# Patient Record
Sex: Female | Born: 1985 | Race: Black or African American | Hispanic: No | Marital: Single | Smoking: Current every day smoker
Health system: Southern US, Community
[De-identification: ages and names within clinical notes are randomized; demographics above are authoritative.]

## PROBLEM LIST (undated history)

## (undated) ENCOUNTER — Inpatient Hospital Stay (HOSPITAL_COMMUNITY): Payer: Self-pay

## (undated) DIAGNOSIS — N281 Cyst of kidney, acquired: Secondary | ICD-10-CM

## (undated) DIAGNOSIS — O021 Missed abortion: Secondary | ICD-10-CM

## (undated) DIAGNOSIS — D573 Sickle-cell trait: Secondary | ICD-10-CM

## (undated) DIAGNOSIS — J189 Pneumonia, unspecified organism: Secondary | ICD-10-CM

## (undated) DIAGNOSIS — N739 Female pelvic inflammatory disease, unspecified: Secondary | ICD-10-CM

## (undated) DIAGNOSIS — F1010 Alcohol abuse, uncomplicated: Secondary | ICD-10-CM

## (undated) DIAGNOSIS — D649 Anemia, unspecified: Secondary | ICD-10-CM

## (undated) DIAGNOSIS — E282 Polycystic ovarian syndrome: Secondary | ICD-10-CM

## (undated) DIAGNOSIS — F319 Bipolar disorder, unspecified: Secondary | ICD-10-CM

## (undated) DIAGNOSIS — K859 Acute pancreatitis without necrosis or infection, unspecified: Secondary | ICD-10-CM

## (undated) DIAGNOSIS — H35039 Hypertensive retinopathy, unspecified eye: Secondary | ICD-10-CM

## (undated) DIAGNOSIS — M722 Plantar fascial fibromatosis: Secondary | ICD-10-CM

## (undated) DIAGNOSIS — D219 Benign neoplasm of connective and other soft tissue, unspecified: Secondary | ICD-10-CM

## (undated) DIAGNOSIS — I10 Essential (primary) hypertension: Secondary | ICD-10-CM

## (undated) DIAGNOSIS — K862 Cyst of pancreas: Secondary | ICD-10-CM

## (undated) DIAGNOSIS — K219 Gastro-esophageal reflux disease without esophagitis: Secondary | ICD-10-CM

## (undated) DIAGNOSIS — J40 Bronchitis, not specified as acute or chronic: Secondary | ICD-10-CM

## (undated) DIAGNOSIS — E119 Type 2 diabetes mellitus without complications: Secondary | ICD-10-CM

## (undated) HISTORY — PX: DILATION AND CURETTAGE OF UTERUS: SHX78

## (undated) HISTORY — DX: Female pelvic inflammatory disease, unspecified: N73.9

## (undated) HISTORY — DX: Essential (primary) hypertension: I10

## (undated) HISTORY — DX: Polycystic ovarian syndrome: E28.2

## (undated) HISTORY — DX: Cyst of kidney, acquired: N28.1

## (undated) HISTORY — DX: Gastro-esophageal reflux disease without esophagitis: K21.9

## (undated) HISTORY — DX: Plantar fascial fibromatosis: M72.2

## (undated) HISTORY — DX: Acute pancreatitis without necrosis or infection, unspecified: K85.90

## (undated) HISTORY — PX: WISDOM TOOTH EXTRACTION: SHX21

## (undated) HISTORY — DX: Alcohol abuse, uncomplicated: F10.10

## (undated) HISTORY — DX: Hypertensive retinopathy, unspecified eye: H35.039

---
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
## 2004-10-07 ENCOUNTER — Other Ambulatory Visit: Admission: RE | Admit: 2004-10-07 | Discharge: 2004-10-07 | Payer: Self-pay | Admitting: Gynecology

## 2004-10-25 ENCOUNTER — Inpatient Hospital Stay (HOSPITAL_COMMUNITY): Admission: AD | Admit: 2004-10-25 | Discharge: 2004-10-26 | Payer: Self-pay | Admitting: Family Medicine

## 2006-04-17 ENCOUNTER — Inpatient Hospital Stay (HOSPITAL_COMMUNITY): Admission: AD | Admit: 2006-04-17 | Discharge: 2006-04-17 | Payer: Self-pay | Admitting: Obstetrics

## 2008-12-24 ENCOUNTER — Emergency Department (HOSPITAL_COMMUNITY): Admission: EM | Admit: 2008-12-24 | Discharge: 2008-12-24 | Payer: Self-pay | Admitting: Emergency Medicine

## 2008-12-25 ENCOUNTER — Emergency Department (HOSPITAL_COMMUNITY): Admission: EM | Admit: 2008-12-25 | Discharge: 2008-12-25 | Payer: Self-pay | Admitting: Emergency Medicine

## 2008-12-25 ENCOUNTER — Encounter (INDEPENDENT_AMBULATORY_CARE_PROVIDER_SITE_OTHER): Payer: Self-pay | Admitting: *Deleted

## 2008-12-30 ENCOUNTER — Emergency Department (HOSPITAL_COMMUNITY): Admission: EM | Admit: 2008-12-30 | Discharge: 2008-12-30 | Payer: Self-pay | Admitting: Emergency Medicine

## 2009-11-05 ENCOUNTER — Encounter: Admission: RE | Admit: 2009-11-05 | Discharge: 2009-11-05 | Payer: Self-pay | Admitting: Family Medicine

## 2010-03-07 HISTORY — PX: SPLENECTOMY, TOTAL: SHX788

## 2010-03-07 HISTORY — PX: PANCREAS SURGERY: SHX731

## 2010-05-10 ENCOUNTER — Emergency Department (HOSPITAL_COMMUNITY)
Admission: EM | Admit: 2010-05-10 | Discharge: 2010-05-10 | Disposition: A | Discharge status: Home / Self Care | Payer: Managed Care, Other (non HMO) | Attending: Emergency Medicine | Admitting: Emergency Medicine

## 2010-05-11 ENCOUNTER — Emergency Department (INDEPENDENT_AMBULATORY_CARE_PROVIDER_SITE_OTHER): Payer: Managed Care, Other (non HMO)

## 2010-05-11 ENCOUNTER — Inpatient Hospital Stay (HOSPITAL_COMMUNITY): Payer: Managed Care, Other (non HMO)

## 2010-05-11 ENCOUNTER — Encounter (INDEPENDENT_AMBULATORY_CARE_PROVIDER_SITE_OTHER): Payer: Self-pay | Admitting: *Deleted

## 2010-05-11 ENCOUNTER — Inpatient Hospital Stay (HOSPITAL_COMMUNITY)
Admission: AD | Admit: 2010-05-11 | Discharge: 2010-05-13 | DRG: 440 | Disposition: A | Discharge status: Home / Self Care | Payer: Managed Care, Other (non HMO) | Source: Other Acute Inpatient Hospital | Attending: Internal Medicine | Admitting: Internal Medicine

## 2010-05-11 ENCOUNTER — Emergency Department (HOSPITAL_BASED_OUTPATIENT_CLINIC_OR_DEPARTMENT_OTHER)
Admission: EM | Admit: 2010-05-11 | Discharge: 2010-05-11 | Disposition: A | Discharge status: Nursing Home (Includes ICF) | Payer: Managed Care, Other (non HMO) | Attending: Emergency Medicine | Admitting: Emergency Medicine

## 2010-05-11 DIAGNOSIS — K219 Gastro-esophageal reflux disease without esophagitis: Secondary | ICD-10-CM | POA: Diagnosis present

## 2010-05-11 DIAGNOSIS — J45909 Unspecified asthma, uncomplicated: Secondary | ICD-10-CM | POA: Diagnosis present

## 2010-05-11 DIAGNOSIS — R110 Nausea: Secondary | ICD-10-CM

## 2010-05-11 DIAGNOSIS — E669 Obesity, unspecified: Secondary | ICD-10-CM | POA: Diagnosis present

## 2010-05-11 DIAGNOSIS — F17200 Nicotine dependence, unspecified, uncomplicated: Secondary | ICD-10-CM | POA: Diagnosis present

## 2010-05-11 DIAGNOSIS — K863 Pseudocyst of pancreas: Secondary | ICD-10-CM | POA: Insufficient documentation

## 2010-05-11 DIAGNOSIS — K862 Cyst of pancreas: Secondary | ICD-10-CM | POA: Insufficient documentation

## 2010-05-11 DIAGNOSIS — D509 Iron deficiency anemia, unspecified: Secondary | ICD-10-CM | POA: Diagnosis present

## 2010-05-11 DIAGNOSIS — Z833 Family history of diabetes mellitus: Secondary | ICD-10-CM

## 2010-05-11 DIAGNOSIS — F1010 Alcohol abuse, uncomplicated: Secondary | ICD-10-CM | POA: Diagnosis present

## 2010-05-11 DIAGNOSIS — R109 Unspecified abdominal pain: Secondary | ICD-10-CM | POA: Insufficient documentation

## 2010-05-11 DIAGNOSIS — Z6379 Other stressful life events affecting family and household: Secondary | ICD-10-CM

## 2010-05-11 DIAGNOSIS — Z8249 Family history of ischemic heart disease and other diseases of the circulatory system: Secondary | ICD-10-CM

## 2010-05-11 DIAGNOSIS — E282 Polycystic ovarian syndrome: Secondary | ICD-10-CM | POA: Diagnosis present

## 2010-05-11 DIAGNOSIS — K861 Other chronic pancreatitis: Secondary | ICD-10-CM | POA: Diagnosis present

## 2010-05-11 LAB — DIFFERENTIAL
Eosinophils Absolute: 0.1 10*3/uL (ref 0.0–0.7)
Lymphocytes Relative: 25 % (ref 12–46)
Lymphs Abs: 1.6 10*3/uL (ref 0.7–4.0)
Monocytes Absolute: 0.5 10*3/uL (ref 0.1–1.0)
Monocytes Relative: 8 % (ref 3–12)
Neutro Abs: 4.3 10*3/uL (ref 1.7–7.7)
Neutrophils Relative %: 66 % (ref 43–77)

## 2010-05-11 LAB — COMPREHENSIVE METABOLIC PANEL
AST: 22 U/L (ref 0–37)
Alkaline Phosphatase: 98 U/L (ref 39–117)
BUN: 12 mg/dL (ref 6–23)
Sodium: 142 mEq/L (ref 135–145)
Total Protein: 7.7 g/dL (ref 6.0–8.3)

## 2010-05-11 LAB — CBC
Hemoglobin: 12.1 g/dL (ref 12.0–15.0)
MCH: 22.4 pg — ABNORMAL LOW (ref 26.0–34.0)
MCHC: 35 g/dL (ref 30.0–36.0)
WBC: 6.5 10*3/uL (ref 4.0–10.5)

## 2010-05-11 LAB — LIPASE, BLOOD: Lipase: 98 U/L (ref 23–300)

## 2010-05-11 MED ORDER — GADOBENATE DIMEGLUMINE 529 MG/ML IV SOLN: 18.0000 mL | Freq: Once | INTRAVENOUS | Status: DC

## 2010-05-11 MED ORDER — IOHEXOL 300 MG/ML  SOLN
100.0000 mL | Freq: Once | INTRAMUSCULAR | Status: AC | PRN
  Administered 2010-05-11: 97 mL via INTRAVENOUS

## 2010-05-12 DIAGNOSIS — K859 Acute pancreatitis without necrosis or infection, unspecified: Secondary | ICD-10-CM

## 2010-05-12 DIAGNOSIS — R933 Abnormal findings on diagnostic imaging of other parts of digestive tract: Secondary | ICD-10-CM

## 2010-05-13 ENCOUNTER — Encounter (INDEPENDENT_AMBULATORY_CARE_PROVIDER_SITE_OTHER): Payer: Self-pay | Admitting: *Deleted

## 2010-05-13 DIAGNOSIS — K859 Acute pancreatitis without necrosis or infection, unspecified: Secondary | ICD-10-CM

## 2010-05-13 LAB — CBC
Hemoglobin: 11.2 g/dL — ABNORMAL LOW (ref 12.0–15.0)
MCH: 22.1 pg — ABNORMAL LOW (ref 26.0–34.0)
MCHC: 32.7 g/dL (ref 30.0–36.0)
MCV: 67.6 fL — ABNORMAL LOW (ref 78.0–100.0)
Platelets: 257 10*3/uL (ref 150–400)

## 2010-05-13 LAB — COMPREHENSIVE METABOLIC PANEL
ALT: 17 U/L (ref 0–35)
Albumin: 3 g/dL — ABNORMAL LOW (ref 3.5–5.2)
BUN: 3 mg/dL — ABNORMAL LOW (ref 6–23)
Creatinine, Ser: 0.66 mg/dL (ref 0.4–1.2)
GFR calc non Af Amer: >60 mL/min (ref >60)
Sodium: 137 mEq/L (ref 135–145)
Total Bilirubin: 0.3 mg/dL (ref 0.3–1.2)

## 2010-05-13 LAB — DIFFERENTIAL
Eosinophils Absolute: 0.1 10*3/uL (ref 0.0–0.7)
Lymphocytes Relative: 37 % (ref 12–46)
Lymphs Abs: 1.9 10*3/uL (ref 0.7–4.0)
Monocytes Absolute: 0.5 10*3/uL (ref 0.1–1.0)
Neutro Abs: 2.6 10*3/uL (ref 1.7–7.7)
Neutrophils Relative %: 51 % (ref 43–77)

## 2010-05-13 LAB — PHOSPHORUS: Phosphorus: 3.2 mg/dL (ref 2.3–4.6)

## 2010-05-18 ENCOUNTER — Telehealth: Payer: Self-pay | Admitting: Gastroenterology

## 2010-05-18 NOTE — Letter (Signed)
Summary: New Patient letter  [facility]  [address] [place], [place] [address]   Phone: [phone]  Fax: [phone]       05/13/2010 MRN: [id number]  [facility] [address] [place], [place]  [address]  Dear [name],  Welcome to the Gastroenterology Division at [facility].    You are scheduled to see Dr.  Arlyce Dice on 06-24-10 at 10:00A.M. on the 3rd floor at [facility], [address]  We ask that you try to arrive at our office 15 minutes prior to your appointment time to allow for check-in.  We would like you to complete the enclosed self-administered evaluation form prior to your visit and bring it with you on the day of your appointment.  We will review it with you.  Also, please bring a complete list of all your medications or, if you prefer, bring the medication bottles and we will list them.  Please bring your insurance card so that we may make a copy of it.  If your insurance requires a referral to see a specialist, please bring your referral form from your primary care physician.  Co-payments are due at the time of your visit and may be paid by cash, check or credit card.     Your office visit will consist of a consult with your physician (includes a physical exam), any laboratory testing he/she may order, scheduling of any necessary diagnostic testing (e.g. x-ray, ultrasound, CT-scan), and scheduling of a procedure (e.g. Endoscopy, Colonoscopy) if required.  Please allow enough time on your schedule to allow for any/all of these possibilities.    If you cannot keep your appointment, please call [phone] to cancel or reschedule prior to your appointment date.  This allows [place] the opportunity to schedule an appointment for another patient in need of care.  If you do not cancel or reschedule by 5 p.m. the business day prior to your appointment date, you will be charged a $50.00 late cancellation/no-show fee.    Thank you for choosing  [facility] for your medical needs.  We appreciate the opportunity to care for you.  Please visit [place] at our website  to learn more about our practice.                     Sincerely,                                                             The Gastroenterology Division

## 2010-05-19 ENCOUNTER — Encounter: Payer: Self-pay | Admitting: Gastroenterology

## 2010-05-19 ENCOUNTER — Other Ambulatory Visit: Payer: Self-pay | Admitting: Gastroenterology

## 2010-05-19 ENCOUNTER — Ambulatory Visit (INDEPENDENT_AMBULATORY_CARE_PROVIDER_SITE_OTHER): Payer: Managed Care, Other (non HMO) | Admitting: Physician Assistant

## 2010-05-19 ENCOUNTER — Encounter: Payer: Self-pay | Admitting: Physician Assistant

## 2010-05-19 DIAGNOSIS — K862 Cyst of pancreas: Secondary | ICD-10-CM

## 2010-05-19 DIAGNOSIS — K859 Acute pancreatitis without necrosis or infection, unspecified: Secondary | ICD-10-CM

## 2010-05-19 DIAGNOSIS — K863 Pseudocyst of pancreas: Secondary | ICD-10-CM

## 2010-05-20 NOTE — H&P (Signed)
NAMEVINCENZINA, [name]           ACCOUNT NO.:  [id number]  MEDICAL RECORD NO.:  [id number]           PATIENT TYPE:  I  LOCATION:  5524                         FACILITY:  [facility]  PHYSICIAN:  Zannie Cove, MD     DATE OF BIRTH:  15-Sep-1985  DATE OF ADMISSION:  05/11/2010 DATE OF DISCHARGE:                             HISTORY & PHYSICAL   PRIMARY CARE PHYSICIAN:  Dr. Knox Royalty at Urgent Care and [name].  CHIEF COMPLAINT:  Abdominal pain.  The patient is transferred from [facility] ER to the floor today. . [name] is a 25 year old female with history of polycystic ovarian syndrome, asthma, and history of pelvic inflammatory disease.  She reports that she drank alcohol heavily a few days ago for the [employer] game and started having abdominal pain the before yesterday in the epigastric region radiating to her lower back.  This was associated with nausea and constipation.  There is no history of fever.  Pain was exacerbated by eating.  She went to the urgent care.  She subsequently reports that her pain started getting more worse yesterday and she had an episode of vomiting in the ER which was nonbloody and hence decided to go to [facility] for evaluation where they had a CAT scan done which showed an abnormality of the pancreas and hence transferred to [facility] for further evaluation and management.  Upon reviewing the CAT scan here now, it seems like it is most likely is a benign- appearing pancreatic cystic lesion in the body of the pancreas.  PAST MEDICAL HISTORY:  Significant for: 1. Asthma. 2. GERD. 3. Polycystic ovarian syndrome. 4. History of pelvic inflammatory disease.  MEDICATIONS: 1. Nexium 40 mg once a day. 2. Albuterol inhaler as needed.  ALLERGIES:  No known drug allergies.  SOCIAL HISTORY:  She lives with her fiance.  Works at [employer] as a Solicitor.  Denies any history of drug use.  Drinks 1-3 drinks a  week, but drank 3-4 drinks after games several days ago.  Smokes 1 pack in 2 days.  FAMILY HISTORY:  Diabetes and hypertension in the mother.  Father is a drug-addict.  REVIEW OF SYSTEMS:  A 12-system review is negative except per HPI.  PHYSICAL EXAMINATION:  VITAL SIGNS:  Temperature is 98.1, pulse 68, blood pressure 118/85, respirations 16, and saturating 99% on room air. GENERAL:  She is an obese African American female, laying in the stretcher, in no acute distress. HEENT:  Pupils are round and reactive to light.  Extraocular movements are intact.  No pallor or icterus. CARDIOVASCULAR:  S1 and S2.  Regular rate and rhythm. LUNGS:  Clear to auscultation bilaterally. ABDOMEN:  Soft and obese.  There is mild epigastric and right upper quadrant tenderness.  Murphy sign is negative.  There are normal bowel sounds.  No rigidity or rebound. EXTREMITIES:  No edema, clubbing, or cyanosis.  LABORATORY DATA:  Review of laboratory data shows a white count of 6.5, hemoglobin 12.1, and platelets 316.  Chemistries:  Sodium 142, potassium 4.3, chloride 107, bicarb 25, BUN 12, creatinine 0.6, and glucose 129. LFTs  normal.  Lipase is 98.  CT of abdomen and pelvis on May 10, 2010, shows a 5.1-cm cystic lesion in the pancreatic body.  Further evaluation with nonemergent MRI is recommended.  ASSESSMENT/PLAN:  [name] is a 25 year old female with: 1. Abdominal pain.  I suspect this is secondary to likely alcoholic     gastritis, gastroesophageal reflux disease. 2. Pancreatic cyst. 3. Alcohol use. 4. Tobacco use. 5. Asthma. 6. Polycystic ovarian syndrome.  PLAN:  We will give her GI cocktail now and put her on IV PPI and get an MRI of the abdomen.  Adequate pain control.  We will decrease the morphine dose.  The patient is advised regarding stopping alcohol.  May be able to discharge later today or tomorrow if workup unrevealing.  We will also check a urine pregnancy test and  UA.     Zannie Cove, MD     PJ/MEDQ  D:  05/11/2010  T:  05/11/2010  Job:  [id number]  Electronically Signed by Zannie Cove  on 05/20/2010 05:21:54 PM

## 2010-05-24 NOTE — Discharge Summary (Signed)
[name], [name]           ACCOUNT NO.:  [id number]  MEDICAL RECORD NO.:  [id number]           PATIENT TYPE:  I  LOCATION:  5524                         FACILITY:  [facility]  PHYSICIAN:  Rock Nephew, MD       DATE OF BIRTH:  [birth year]  DATE OF ADMISSION:  05/11/2010 DATE OF DISCHARGE:  05/13/2010                        DISCHARGE SUMMARY - REFERRING   PRIMARY CARE PHYSICIAN:  Dr. Knox Royalty at Urgent Care and [facility].  DISCHARGE DIAGNOSES: 1. 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 2. Abdominal pain from pancreatic pseudocyst. 3. Alcohol abuse, counseled. 4. Polycystic ovarian syndrome. 5. Asthma. 6. Iron deficiency, most likely evidence of microcytic anemia.  DISCHARGE MEDICATIONS: 1. Famotidine 20 mg p.o. daily. 2. Percocet 1 tablet p.o. q.6 h as needed for pain. 3. Zofran 4 mg by mouth every 6 hours as needed. 4. Albuterol 1-2 puffs by mouth daily as needed. 5. Also, the patient was instructed by the [facility] to buy     over-the-counter iron tablets and take 1 tablet daily at least four     times weekly.  CONSULTATIONS OF THIS CASE:  [facility], Dr. Arlyce Dice.  DIET ON DISCHARGE:  Low-fat diet, carbohydrate-modified diet to rest the pancreas.  FOLLOWUP:  The patient should follow up with Dr. Knox Royalty, primary care physician, within 1 week.  The patient should have a CT scan ordered hopefully by Dr. Yetta Barre to be done from June 15, 2010 to the June 22, 2010, and the results could be taken to Dr. Arlyce Dice on the patient's followup appointment with Dr. Arlyce Dice on June 24, 2010 at 10 a.m.  PROCEDURES PERFORMED:  The patient had a CT scan of the abdomen and pelvis, which showed 5.1 cm cystic lesion within the pancreatic body. Differential considerations include a pseudocyst or cystic neoplasm. Recommended further evaluation with nonemergent outpatient MRI of the abdomen without and with contrast.   The patient had MRI of the abdomen, which showed 5.4 cm and mildly complex cystic lesion in the junction of pancreatic body and tail, with findings of mild pancreatitis, involving the pancreatic tail.  This lesion suspicious for pancreatic pseudocyst, particularly if there is a history of prior pancreatitis.  Cystic pancreatic neoplasm suggest intraductal papillary mucinous neoplasm, serous cystadenoma cannot definitely be excluded.  Consider transgastric or percutaneous needle aspiration and continued imaging, followup MRI.  BRIEF HISTORY OF PRESENT ILLNESS:  This is a 25 year old female with history of polycystic ovarian syndrome, as mentioned pelvic inflammatory disease.  She reports she drank alcohol heavily few days ago for the [facility] [employer].  Also, the patient reports that she has been having drinking heavily since she was old enough to drink.  She came in with abdominal pain.  Imaging showed a complex pancreatic pseudocyst.  HOSPITAL COURSE: 1. Pancreatic pseudocyst.  The patient was initially n.p.o. and she     was on clear liquid diet.  She was seen in consultation with     [facility], they recommended to advance the patient's diet as     tolerated and to follow up with Dr. Arlyce Dice on June 24, 2010.  They     also recommended the patient to have a CT scan of the belly from     June 15, 2010, to June 22, 2010, before following up with Dr.     Arlyce Dice. 2. Abdominal pain.  The patient has had some abdominal pain.  This is     secondary to complex pancreatic pseudocyst. 3. Alcohol abuse.  The patient was counseled. 4. Polycystic ovarian syndrome, stable. 5. Asthma.  The patient takes an albuterol inhaler. 6. Possible iron deficiency.  The patient had a MCV of 67 and the     patient's hemoglobin was 11.2 and hematocrit 34.2.  [facility]     recommend the patient to take iron over-the-counter at least four     times a week.  The patient is eating a regular diet and is ready      for discharge on May 13, 2010.     Rock Nephew, MD     NH/MEDQ  D:  05/13/2010  T:  05/13/2010  Job:  [id number]  cc:   Dr. Ellin Mayhew. Arlyce Dice, MD,FACG  Electronically Signed by Rock Nephew MD on 05/24/2010 09:57:16 PM

## 2010-05-25 NOTE — Miscellaneous (Signed)
Summary: Hospital History and Physical  Jennifer [name], [name]           ACCOUNT NO.:  [id number]      MEDICAL RECORD NO.:  [id number]           PATIENT TYPE:  I      LOCATION:  5524                         FACILITY:  [facility]      PHYSICIAN:  Zannie Cove, MD     DATE OF BIRTH:  1985/11/19      DATE OF ADMISSION:  05/11/2010   DATE OF DISCHARGE:                                 HISTORY & PHYSICAL         PRIMARY CARE PHYSICIAN:  Dr. Knox Royalty at Urgent Care and [facility].      CHIEF COMPLAINT:  Abdominal pain.  The patient is transferred from [facility] ER to the floor today.   .   Jennifer [name] is a 25 year old female with history of polycystic ovarian   syndrome, asthma, and history of pelvic inflammatory disease.  She   reports that she drank alcohol heavily a few days ago for the [employer]   game and started having abdominal pain the before yesterday in the   epigastric region radiating to her lower back.  This was associated with   nausea and constipation.  There is no history of fever.  Pain was   exacerbated by eating.  She went to the urgent care.  She subsequently   reports that her pain started getting more worse yesterday and she had   an episode of vomiting in the ER which was nonbloody and hence decided   to go to [facility] for evaluation where they had a CAT scan   done which showed an abnormality of the pancreas and hence transferred   to [facility] for further evaluation and management.  Upon reviewing   the CAT scan here now, it seems like it is most likely is a benign-   appearing pancreatic cystic lesion in the body of the pancreas.      PAST MEDICAL HISTORY:  Significant for:   1. Asthma.   2. GERD.   3. Polycystic ovarian syndrome.   4. History of pelvic inflammatory disease.      MEDICATIONS:   1. Nexium 40 mg once a day.   2. Albuterol inhaler as needed.      ALLERGIES:  No known drug allergies.      SOCIAL  HISTORY:  She lives with her fiance.  Works at [employer] as a Solicitor.  Denies any history of drug use.  Drinks 1-3 drinks   a week, but drank 3-4 drinks after games several days ago.  Smokes 1   pack in 2 days.      FAMILY HISTORY:  Diabetes and hypertension in the mother.  Father is a   drug-addict.      REVIEW OF SYSTEMS:  A 12-system review is negative except per HPI.      PHYSICAL EXAMINATION:  VITAL SIGNS:  Temperature is 98.1, pulse 68,   blood pressure 118/85, respirations 16, and saturating 99% on room air.   GENERAL:  She is  an obese African American female, laying in the   stretcher, in no acute distress.   HEENT:  Pupils are round and reactive to light.  Extraocular movements   are intact.  No pallor or icterus.   CARDIOVASCULAR:  S1 and S2.  Regular rate and rhythm.   LUNGS:  Clear to auscultation bilaterally.   ABDOMEN:  Soft and obese.  There is mild epigastric and right upper   quadrant tenderness.  Murphy sign is negative.  There are normal bowel   sounds.  No rigidity or rebound.   EXTREMITIES:  No edema, clubbing, or cyanosis.      LABORATORY DATA:  Review of laboratory data shows a white count of 6.5,   hemoglobin 12.1, and platelets 316.  Chemistries:  Sodium 142, potassium   4.3, chloride 107, bicarb 25, BUN 12, creatinine 0.6, and glucose 129.   LFTs normal.  Lipase is 98.  CT of abdomen and pelvis on May 10, 2010,   shows a 5.1-cm cystic lesion in the pancreatic body.  Further evaluation   with nonemergent MRI is recommended.      ASSESSMENT/PLAN:  Jennifer [name] is a 25 year old female with:   1. Abdominal pain.  I suspect this is secondary to likely alcoholic       gastritis, gastroesophageal reflux disease.   2. Pancreatic cyst.   3. Alcohol use.   4. Tobacco use.   5. Asthma.   6. Polycystic ovarian syndrome.      PLAN:  We will give her GI cocktail now and put her on IV PPI and get an   MRI of the abdomen.  Adequate pain control.  We  will decrease the   morphine dose.  The patient is advised regarding stopping alcohol.  May   be able to discharge later today or tomorrow if workup unrevealing.  We   will also check a urine pregnancy test and UA.               Zannie Cove, MD               PJ/MEDQ  D:  05/11/2010  T:  05/11/2010  Job:  [id number]  Clinical Lists Changes

## 2010-05-25 NOTE — Progress Notes (Signed)
Summary: triage: Abdominal pain  Phone Note Call from Patient Call back at Home Phone [phone]   Caller: Patient Call For: Dr Arlyce Dice Reason for Call: Talk to Nurse Summary of Call: Patient wants to speak nurse regarding abd pain she felt last night after work, states that it felt the same way as when she was put in the hosp. Initial call taken by: Tawni Levy,  May 18, 2010 8:38 AM  Follow-up for Phone Call        Patient was in the hospital last week for pain, pancreatic pseudocyst. Patient states that she went to work yesterday and followed all the instructions as far as not lifting anything heavy. She worked all day but did do a lot of bending. Last night she had the same pain in her back that she did when she was admitted with pain from her pancreatic pseudocyst. She called and got in touch with a physician at the hospital, she was told to stick to clear liquids throughout the night. The pain in her back is better but she still has some abdominal discomfort. Patient did not go to work today, she is afraid and states she "does not want to lose her pancreas." Patient wants to know if there is anything else she can do. Dr. Leone Payor please advise as you are doc of the day. Follow-up by: Selinda Michaels RN,  May 18, 2010 9:10 AM  Additional Follow-up for Phone Call Additional follow up Details #1::        probably not a serious event but may not be ready to return to work full time stay on clear liquids for now -  we should see her this week to check on her and advise further activity Iva Boop MD, [facility]  May 18, 2010 9:29 AM     Additional Follow-up for Phone Call Additional follow up Details #2::    Patient aware of Dr. Marvell Fuller recommendations. Appt made for patient to see Mike Gip, PA 05/19/10@9am . Patient aware of appointment date and time. Follow-up by: Selinda Michaels RN,  May 18, 2010 9:43 AM

## 2010-05-25 NOTE — Miscellaneous (Signed)
Summary: MRI   Accession Number: [id number]      Clinical Data: Cystic pancreatic mass.  Abdominal pain and nausea.    MRI ABDOMEN WITH AND WITHOUT CONTRAST    Technique:  Multiplanar multisequence MR imaging of the abdomen was  performed both before and after administration of intravenous  contrast.    Contrast: 18 ml Multihance    Comparison: Recent abdomen CT    Findings: A cystic lesion is seen at the junction of the pancreatic  body and tail which contains a thin enhancing the lateral wall and  a single thin internal septation which shows no contrast  enhancement.  This measures 5.4 x 5.3 cm.  There is a atrophy and  main duct dilatation of the pancreatic tail distal to this lesion.  No evidence of other pancreatic masses or main duct dilatation in  the pancreatic head or neck.  There is mild  peripancreatic  inflammatory change seen adjacent to the pancreatic tail,  consistent with mild pancreatitis.  This lesion is suspicious for a  pancreatic pseudocyst, although a cystic pancreatic neoplasm such  as intraductal papillary mucinous neoplasm or serous cystadenoma  cannot definitely be excluded.    There is no evidence of biliary ductal dilatation.  A small amount  of layering sludge noted, without definite evidence of gallstones  or cholecystitis.    The liver, spleen, adrenal glands, and left kidney are normal in  appearance.  Small right upper pole renal cyst is noted, however  there is no evidence of renal neoplasm or hydronephrosis.  No  abdominal lymphadenopathy is identified.    IMPRESSION:    5.4 cm mildly complex cystic lesion at the junction of the  pancreatic body and tail, with findings of mild pancreatitis  involving the pancreatic tail.  This lesion is suspicious for a  pancreatic pseudocyst, particularly if there is a history of prior  pancreatitis.  A cystic pancreatic neoplasm such as intraductal  papillary mucinous neoplasm or serous cystadenoma  cannot definitely  be excluded.  Consider transgastric versus percutaneous needle  aspiration or continued imaging followup by MRI.    Original Report Authenticated By: Danae Orleans, M.D.Clinical Lists Changes

## 2010-05-25 NOTE — Letter (Signed)
Summary: Out of Work  [facility]  [address] [place], [place] [address]   Phone: [phone]  Fax: [phone]    May 19, 2010   Employee:  NELANI SCHMELZLE    To Whom It May Concern:   For Medical reasons, please excuse the above named employee from work for the following dates:  Start:   05-18-2010  End:   05-21-2010  If you need additional information, please feel free to contact our office.         Sincerely,        Dr. Rob Bunting Amy Esterwood PA-C

## 2010-05-25 NOTE — Miscellaneous (Signed)
Summary: CT Abd/Pelvis  CT Abd/Pelvis W CM - STATUS: Final  IMAGE                                     Perform Date: 6 Mar12 06:06  Ordered By: DEFAULT MD , PROVIDER         Ordered Date:  Facility: [facility]                              Department: CT  Service Report Text  [facility] Accession Number: [id number]     Clinical Data: Abdominal pain, nausea.    CT ABDOMEN AND PELVIS WITH CONTRAST    Technique:  Multidetector CT imaging of the abdomen and pelvis was   performed following the standard protocol during bolus   administration of intravenous contrast.    Contrast: 97 ml Omnipaque 300 IV.    Comparison: None    Findings: Lung bases are clear.  No effusions.  Heart is normal   size.    Liver, gallbladder, spleen, adrenals and kidneys are unremarkable   except for simple appearing 2 cm right upper pole cyst.    There is a cystic lesion within the body of the pancreas, measuring   5.1 cm.  There is mild ductal dilatation within the pancreatic tail   beyond that this cystic lesion.  No definite solid component seen.   The remainder the pancreas is unremarkable.    Portal vein is patent.  Aorta is normal caliber.  No free fluid,   free air or adenopathy.  Uterus, adnexa and bladder unremarkable.   Appendix is visualized and is normal.  Large and small bowel   unremarkable.    IMPRESSION:   5.1 cm cystic lesion within the pancreatic body.  Differential   considerations would include a pseudocyst or cystic neoplasm.   Recommend further evaluation with nonemergent outpatient MRI of the   abdomen without and with contrast (pancreatic protocol).    Original Report Authenticated By: Cyndie Chime, M.D.  Additional Information  [id number] RESULT STATUS : F  External image : [id number]  External IF Update Timestamp : 2010-05-11:06:06:00.000000 Clinical Lists Changes

## 2010-05-25 NOTE — Assessment & Plan Note (Signed)
History of Present Illness Visit Type: Initial Visit Primary GI MD: Melvia Heaps MD [facility] Primary Provider: Knox Royalty, MD Chief Complaint: pancreatitis, hospital FU History of Present Illness:   PLEASNT 25 YO FEMALE NEW TO DR. KAPLAN WHEN SEEN IN CONSULT AT [facility] LAST WEEK. SHE WAS ADMITTED WITH ACUTE ABDOMINAL PAIN, AND FOUND TO HAVE PANCREATITIS WITH A CYSTIC LESION IN THE BODY OF THE PANCREAS FELT MOST CONSISTENT WITH A PSEUDOCYST. SHE HAD NORMAL LFT'S AND A NORMAL LIPASE ON ADMIT. SHE HAD DEVELOPED ACUTE PROGRESSIVE EPIGASTRIC AND BACK PAIN ON 3/5 AFTER DRINKING QUITE A BIT OF ETOH THAT WEEKEND.  SHE WAS DISCHARGED AFTER 2 DAYS WITH PLANS FOR FOLLOW UP IMAGING IN A MONTH. SHE HAD ANOTHER BAD EPISODE OF PRIMARILY MID BACK PAIN ON 3/12. NO N/V. NO FEVER/CHILLS. SHE FELT BETTER YESTERDAY , HAS BEEN ABLE TO EAT . SHE HAS PERCOCET FOR PAIN WHICH SHE IS USING SPARINGLY. TODAY SHE CONTINUES WITH MILD PAIN. SHE HAD BEEN TRYING TO WORK, BUT DID NOT GO YESTERDAY DUE TO PAIN.  SHE DENIES ANY PROBLEMS WITH ALCOHOL. SHE ADMITS TO DRINKING REGULARLY IN COLLEGE, AND OCCASIONALLY HEAVILY ON WEEKENDS. SHE DOES NOT DRING DAILY ,MAY HAVE A GLASS OF WINE 3 DAYS A WEEK.SHE UNDERSTANDS THAT ALCOHOL MAY HAVE TRIGGERED THIS  AND DOES NOT PLAN TO DRINK  ANY ETOH.   GI Review of Systems    Reports abdominal pain.     Location of  Abdominal pain: epigastric area.    Denies acid reflux, belching, bloating, chest pain, dysphagia with liquids, dysphagia with solids, heartburn, loss of appetite, nausea, vomiting, vomiting blood, weight loss, and  weight gain.      Reports constipation.     Denies anal fissure, black tarry stools, change in bowel habit, diarrhea, diverticulosis, fecal incontinence, heme positive stool, hemorrhoids, irritable bowel syndrome, jaundice, light color stool, liver problems, rectal bleeding, and  rectal pain. Preventive Screening-Counseling & Management  Alcohol-Tobacco     Smoking Status:  current  Caffeine-Diet-Exercise     Does Patient Exercise: no      Drug Use:  no.      Current Medications (verified): 1)  Pepcid 20 Mg Tabs (Famotidine) .... Take 1 Tablet By Mouth Once Daily 2)  Percocet 5-325 Mg Tabs (Oxycodone-Acetaminophen) .... Take 1 Tablet By Mouth Every 6 Hours As Needed 3)  Proair Hfa 108 (90 Base) Mcg/act Aers (Albuterol Sulfate) .... As Needed  Allergies (verified): No Known Drug Allergies  Past History:  Past Medical History: Asthma GERD Polycstic ovarian  s yndrome HX of Pelvic inflammatory disease  Family History: No FH of Colon Cancer: Family History of Diabetes: Mother  Social History: Occupation: MRA clerk Patient currently smokes.  Alcohol Use - yes Daily Caffeine Use Illicit Drug Use - no Patient does not get regular exercise.  Smoking Status:  current Drug Use:  no Does Patient Exercise:  no  Review of Systems       The patient complains of back pain, fatigue, and menstrual pain.  The patient denies allergy/sinus, anemia, anxiety-new, arthritis/joint pain, blood in urine, breast changes/lumps, change in vision, confusion, cough, coughing up blood, depression-new, fainting, fever, headaches-new, hearing problems, heart murmur, heart rhythm changes, itching, muscle pains/cramps, night sweats, nosebleeds, pregnancy symptoms, shortness of breath, skin rash, sleeping problems, sore throat, swelling of feet/legs, swollen lymph glands, thirst - excessive , urination - excessive , urination changes/pain, urine leakage, vision changes, and voice change.         SEE HPI  Vital Signs:  Patient profile:   25 year old female Height:      60 inches Weight:      189.13 pounds BMI:     37.07 Pulse rate:   96 / minute Pulse rhythm:   regular BP sitting:   112 / 80  (left arm) Cuff size:   regular  Vitals Entered By: June McMurray CMA Duncan Dull) (May 19, 2010 9:10 AM)  Physical Exam  General:  Well developed, well nourished, no acute  distress. Head:  Normocephalic and atraumatic. Eyes:  PERRLA, no icterus. Lungs:  Clear throughout to auscultation. Heart:  Regular rate and rhythm; no murmurs, rubs,  or bruits. Abdomen:  OBESE, SOFT, MILD TENDERNESS ACROSS UPPER ABDOMEN, NO GUARDING, NO MASS OR HSM,BS+ Rectal:  NOT DONE Extremities:  No clubbing, cyanosis, edema or deformities noted. Neurologic:  Alert and  oriented x4;  grossly normal neurologically. Psych:  Alert and cooperative. Normal mood and affect.   Impression & Recommendations:  Problem # 1:  PANCREATIC PSEUDOCYST Assessment New 25 YO FEMALE WITH ACUTE ABDOMINAL /BACK PAIN  ONSET 05/10/10.HOSPITALIZED; FINDING OF A  5.4X 5.3 CM CYSTIC LESION IN THE BODY OF THE PANCREAS,MILD PANCREATITIS INVOLVING THE TAIL OF THE PANCREAS. PT WITH PERSISTENT PAIN.  FINDINGS REVIEWED WITH PT CONTINUE AVOIDANCE OF ALL ETOH LOW FAT DIET PERCOCET AS NEEDED FOR PAIN ,1-3 DAILY MRI AND CT REVIEWED WITH DR. Christella Hartigan AND HE AGREES THIS HAS FEATURES MOST CONSISTENT WITH A PSEUDOCYST. PLAN IS FOR REPEAT IMAGING  WITH MRI IN 4 WEEKS. FOLLOW UP DR. KAPLAN IN 4 WEEKS NOTE FOR WORK FOR THIS WEEK,PT HAS FMLA PAPERS AS WELL. SHE IS ADVISED TO CALL FOR WORSENING OF PAIN,VOMITING, FEVER IN THE INTERIM.  Problem # 2:  ASTHMA Assessment: Comment Only  Problem # 3:  GERD Assessment: Comment Only  Patient Instructions: 1)  We scheduled the MRI at [facility] at [address], 2)  Instructions and directions given. 3)  We have given you a Low Fat Diet brochure. 4)  We faxed a prescription for the Percocet to [facility] [address] and gave you the prescription to give them. 5)  Copy sent to : Dr Knox Royalty 6)  The medication list was reviewed and reconciled.  All changed / newly prescribed medications were explained.  A complete medication list was provided to the patient / caregiver.  Appended Document:  if this the cyst is clearly smaller on MRI, then OK to continue observation.  If not, then  would proceed with EUS/fna

## 2010-06-05 ENCOUNTER — Emergency Department (HOSPITAL_COMMUNITY)
Admission: EM | Admit: 2010-06-05 | Discharge: 2010-06-05 | Disposition: A | Discharge status: Home / Self Care | Payer: Managed Care, Other (non HMO) | Attending: Emergency Medicine | Admitting: Emergency Medicine

## 2010-06-05 DIAGNOSIS — E119 Type 2 diabetes mellitus without complications: Secondary | ICD-10-CM | POA: Insufficient documentation

## 2010-06-05 DIAGNOSIS — H538 Other visual disturbances: Secondary | ICD-10-CM | POA: Insufficient documentation

## 2010-06-05 DIAGNOSIS — R1013 Epigastric pain: Secondary | ICD-10-CM | POA: Insufficient documentation

## 2010-06-05 DIAGNOSIS — J45909 Unspecified asthma, uncomplicated: Secondary | ICD-10-CM | POA: Insufficient documentation

## 2010-06-05 DIAGNOSIS — R51 Headache: Secondary | ICD-10-CM | POA: Insufficient documentation

## 2010-06-05 LAB — CBC
HCT: 38.6 % (ref 36.0–46.0)
Hemoglobin: 13.1 g/dL (ref 12.0–15.0)
MCH: 22.7 pg — ABNORMAL LOW (ref 26.0–34.0)
MCHC: 33.9 g/dL (ref 30.0–36.0)
MCV: 66.9 fL — ABNORMAL LOW (ref 78.0–100.0)
Platelets: 303 K/uL (ref 150–400)
RBC: 5.77 MIL/uL — ABNORMAL HIGH (ref 3.87–5.11)
RDW: 14.6 % (ref 11.5–15.5)
WBC: 5.9 K/uL (ref 4.0–10.5)

## 2010-06-05 LAB — COMPREHENSIVE METABOLIC PANEL WITH GFR
ALT: 20 U/L (ref 0–35)
AST: 22 U/L (ref 0–37)
Albumin: 4 g/dL (ref 3.5–5.2)
Alkaline Phosphatase: 65 U/L (ref 39–117)
BUN: 5 mg/dL — ABNORMAL LOW (ref 6–23)
CO2: 24 meq/L (ref 19–32)
Calcium: 9.1 mg/dL (ref 8.4–10.5)
Chloride: 106 meq/L (ref 96–112)
Creatinine, Ser: 0.64 mg/dL (ref 0.4–1.2)
GFR calc non Af Amer: >60 mL/min
Glucose, Bld: 100 mg/dL — ABNORMAL HIGH (ref 70–99)
Potassium: 3.8 meq/L (ref 3.5–5.1)
Sodium: 138 meq/L (ref 135–145)
Total Bilirubin: 0.5 mg/dL (ref 0.3–1.2)
Total Protein: 7.7 g/dL (ref 6.0–8.3)

## 2010-06-05 LAB — URINALYSIS, ROUTINE W REFLEX MICROSCOPIC
Bilirubin Urine: NEGATIVE
Glucose, UA: NEGATIVE mg/dL
Hgb urine dipstick: NEGATIVE
Ketones, ur: >80 mg/dL — AB
Nitrite: NEGATIVE
Protein, ur: NEGATIVE mg/dL
Specific Gravity, Urine: 1.027 (ref 1.005–1.030)
Urobilinogen, UA: 1 mg/dL (ref 0.0–1.0)
pH: 5.5 (ref 5.0–8.0)

## 2010-06-05 LAB — GLUCOSE, CAPILLARY: Glucose-Capillary: 88 mg/dL (ref 70–99)

## 2010-06-05 LAB — LIPASE, BLOOD: Lipase: 16 U/L (ref 11–59)

## 2010-06-10 ENCOUNTER — Telehealth: Payer: Self-pay | Admitting: Gastroenterology

## 2010-06-10 LAB — URINALYSIS, ROUTINE W REFLEX MICROSCOPIC
Bilirubin Urine: NEGATIVE
Glucose, UA: NEGATIVE mg/dL
Hgb urine dipstick: NEGATIVE
Ketones, ur: NEGATIVE mg/dL
Nitrite: NEGATIVE
Protein, ur: NEGATIVE mg/dL
Specific Gravity, Urine: 1.022 (ref 1.005–1.030)
pH: 6.5 (ref 5.0–8.0)

## 2010-06-10 LAB — CBC
HCT: 37 % (ref 36.0–46.0)
Hemoglobin: 12.1 g/dL (ref 12.0–15.0)
MCHC: 32.6 g/dL (ref 30.0–36.0)
MCV: 68.9 fL — ABNORMAL LOW (ref 78.0–100.0)
Platelets: 323 10*3/uL (ref 150–400)
RBC: 5.37 MIL/uL — ABNORMAL HIGH (ref 3.87–5.11)
RDW: 15.1 % (ref 11.5–15.5)

## 2010-06-10 LAB — DIFFERENTIAL
Basophils Absolute: 0 10*3/uL (ref 0.0–0.1)
Basophils Relative: 0 % (ref 0–1)
Eosinophils Absolute: 0.1 10*3/uL (ref 0.0–0.7)
Lymphocytes Relative: 31 % (ref 12–46)
Monocytes Relative: 6 % (ref 3–12)
Neutro Abs: 4.4 10*3/uL (ref 1.7–7.7)
Neutrophils Relative %: 62 % (ref 43–77)

## 2010-06-10 LAB — COMPREHENSIVE METABOLIC PANEL
ALT: 16 U/L (ref 0–35)
AST: 17 U/L (ref 0–37)
Alkaline Phosphatase: 79 U/L (ref 39–117)
BUN: 7 mg/dL (ref 6–23)
CO2: 26 mEq/L (ref 19–32)
Chloride: 107 mEq/L (ref 96–112)
GFR calc non Af Amer: >60 mL/min (ref >60)
Sodium: 140 mEq/L (ref 135–145)

## 2010-06-10 LAB — LIPASE, BLOOD: Lipase: 32 U/L (ref 11–59)

## 2010-06-10 NOTE — Telephone Encounter (Signed)
Pt states that she ate some peanut butter crackers last night and that she started having pain like she did before with the pancreatitis. She took one oxycodone and a couple of hours later took another 1/2 tab. The pain eased off and she went to sleep. Pt has not eaten anything this morning. Pt states she is going to do clear liquids today and thinks it may have been something she ate that caused the pain. She wants to know if there is anything else that she needs to do. Dr. Arlyce Dice please advise.

## 2010-06-10 NOTE — Telephone Encounter (Signed)
Pt aware of Dr. Marzetta Board recommendation. Instructed to call us back if she continued to have problems. Pt verbalized understanding.

## 2010-06-10 NOTE — Telephone Encounter (Signed)
Nothing else to do at this time.

## 2010-06-14 ENCOUNTER — Telehealth: Payer: Self-pay | Admitting: Gastroenterology

## 2010-06-14 DIAGNOSIS — K859 Acute pancreatitis without necrosis or infection, unspecified: Secondary | ICD-10-CM

## 2010-06-14 NOTE — Telephone Encounter (Signed)
Addended by: Selinda Michaels R. on: 06/14/2010 04:11 PM   Modules accepted: Orders

## 2010-06-14 NOTE — Telephone Encounter (Signed)
Pt has the MRI on 06/16/10. When do you want her to have the labs?

## 2010-06-14 NOTE — Telephone Encounter (Signed)
Pt states that she is starting to have more frequent flares with her pancreatitis. She had one Wed night and one Thurs. Morning. She had one again at 3am this morning. She is following a low fat diet and has lost 17 pounds. She knows to do a clear liquid diet when this happens but she is also a diabetic now which makes this hard. She has an MRI on Wed and an appt with the dietitian on Thurs. She sees [place] on the 19th. Pt wants to make sure there isn't anything else she needs to do. Dr. Arlyce Dice please advise.

## 2010-06-14 NOTE — Telephone Encounter (Signed)
The patient was scheduled for an MRI. Let's get that done this week as soon as possible. She'll still needs a CBC, LFTs, amylase, and lipase

## 2010-06-14 NOTE — Telephone Encounter (Signed)
Pt aware and will come for labs tomorrow, orders entered.

## 2010-06-14 NOTE — Telephone Encounter (Signed)
Labs ASAP

## 2010-06-15 ENCOUNTER — Other Ambulatory Visit (INDEPENDENT_AMBULATORY_CARE_PROVIDER_SITE_OTHER): Payer: Managed Care, Other (non HMO)

## 2010-06-15 DIAGNOSIS — K859 Acute pancreatitis without necrosis or infection, unspecified: Secondary | ICD-10-CM

## 2010-06-15 LAB — CBC WITH DIFFERENTIAL/PLATELET
Eosinophils Relative: 1.6 % (ref 0.0–5.0)
Lymphocytes Relative: 38.5 % (ref 12.0–46.0)
MCV: 70.6 fl — ABNORMAL LOW (ref 78.0–100.0)
Monocytes Absolute: 0.4 10*3/uL (ref 0.1–1.0)
Neutrophils Relative %: 52 % (ref 43.0–77.0)
Platelets: 277 10*3/uL (ref 150.0–400.0)
RBC: 5.41 Mil/uL — ABNORMAL HIGH (ref 3.87–5.11)
WBC: 5.2 10*3/uL (ref 4.5–10.5)

## 2010-06-15 LAB — LIPASE: Lipase: 13 U/L (ref 11.0–59.0)

## 2010-06-15 LAB — HEPATIC FUNCTION PANEL
ALT: 18 U/L (ref 0–35)
Alkaline Phosphatase: 67 U/L (ref 39–117)
Bilirubin, Direct: 0.1 mg/dL (ref 0.0–0.3)
Total Bilirubin: 0.6 mg/dL (ref 0.3–1.2)

## 2010-06-16 ENCOUNTER — Ambulatory Visit
Admission: RE | Admit: 2010-06-16 | Discharge: 2010-06-16 | Disposition: A | Discharge status: Home / Self Care | Payer: Managed Care, Other (non HMO) | Source: Ambulatory Visit | Attending: Gastroenterology | Admitting: Gastroenterology

## 2010-06-16 ENCOUNTER — Telehealth: Payer: Self-pay | Admitting: *Deleted

## 2010-06-16 DIAGNOSIS — K863 Pseudocyst of pancreas: Secondary | ICD-10-CM

## 2010-06-16 MED ORDER — GADOBENATE DIMEGLUMINE 529 MG/ML IV SOLN
18.0000 mL | Freq: Once | INTRAVENOUS | Status: AC | PRN
  Administered 2010-06-16: 18 mL via INTRAVENOUS

## 2010-06-16 NOTE — Telephone Encounter (Signed)
[facility] called asking for the order for the MRI abd,pt is there now.  I did the order in EPIC, under [facility] imaging.  Faxed the order to them @ [phone].

## 2010-06-24 ENCOUNTER — Ambulatory Visit (INDEPENDENT_AMBULATORY_CARE_PROVIDER_SITE_OTHER): Payer: Managed Care, Other (non HMO) | Admitting: Gastroenterology

## 2010-06-24 ENCOUNTER — Encounter: Payer: Self-pay | Admitting: Gastroenterology

## 2010-06-24 VITALS — BP 126/74 | HR 88 | Ht 60.0 in | Wt 182.0 lb

## 2010-06-24 DIAGNOSIS — K859 Acute pancreatitis without necrosis or infection, unspecified: Secondary | ICD-10-CM

## 2010-06-24 DIAGNOSIS — K863 Pseudocyst of pancreas: Secondary | ICD-10-CM

## 2010-06-24 MED ORDER — OXYCODONE-ACETAMINOPHEN 5-325 MG PO TABS: 1.0000 | ORAL_TABLET | Freq: Four times a day (QID) | ORAL | Status: DC | PRN

## 2010-06-24 NOTE — Progress Notes (Signed)
History of Present Illness:  [name] has returned following hospitalization for acute alcoholic pancreatitis. She was hospitalized with acute pancreatitis in early March. This was complicated by a pancreatic pseudocyst. Since discharge she has been alcohol-free and has maintained a low fat diet. She's had occasional episodes of moderately severe pain radiating to her back requiring narcotics, which she has used sparingly. Followup MRI, which I reviewed, demonstrated a persistent pseudocyst that has slightly decreased in size compared to one month ago.    Review of Systems: Pertinent positive and negative review of systems were noted in the above HPI section. All other review of systems were otherwise negative.    Current Medications, Allergies, Past Medical History, Past Surgical History, Family History and Social History were reviewed in [facility] electronic medical record  Vital signs were reviewed in today's medical record. Physical Exam: General: Well developed , well nourished, no acute distress Head: Normocephalic and atraumatic Eyes:  sclerae anicteric, EOMI Ears: Normal auditory acuity Mouth: No deformity or lesions Lungs: Clear throughout to auscultation Heart: Regular rate and rhythm; no murmurs, rubs or bruits Abdomen: Soft and non distended. No masses, hepatosplenomegaly or hernias noted. Normal Bowel sounds; there is slight tenderness in the periumbilical area just superior to the umbilicus Rectal:deferred Musculoskeletal: Symmetrical with no gross deformities  Pulses:  Normal pulses noted Extremities: No clubbing, cyanosis, edema or deformities noted Neurological: Alert oriented x 4, grossly nonfocal Psychological:  Alert and cooperative. Normal mood and affect

## 2010-06-24 NOTE — Assessment & Plan Note (Addendum)
This is a complication of her acute pancreatitis. Pancreatitis is on the basis of heavy alcohol use. Patient understands this and has agreed to completely abstain. At issue is the persistence of the pseudocyst, which is occasionally causing pain. It is slightly decreased in size.  Recommendations #1 followup MRI in 6 weeks; if it continues to decrease in size will continue to follow. If the pseudocyst change or enlarges this will require internal drainage.

## 2010-06-24 NOTE — Patient Instructions (Addendum)
MRI has been scheduled at [facility] on 08/09/2010 at 10:30am

## 2010-07-09 ENCOUNTER — Telehealth: Payer: Self-pay | Admitting: Gastroenterology

## 2010-07-09 ENCOUNTER — Other Ambulatory Visit: Payer: Self-pay

## 2010-07-09 DIAGNOSIS — K859 Acute pancreatitis without necrosis or infection, unspecified: Secondary | ICD-10-CM

## 2010-07-09 MED ORDER — OXYCODONE-ACETAMINOPHEN 5-325 MG PO TABS: 1.0000 | ORAL_TABLET | Freq: Four times a day (QID) | ORAL | Status: DC | PRN

## 2010-07-09 NOTE — Telephone Encounter (Signed)
Per Dr. Arlyce Dice pt may have a prescription for 15 Percocet tablets. Pt will pick up the prescription.

## 2010-07-09 NOTE — Telephone Encounter (Signed)
Pt scheduled to see Willette Cluster NP 07/14/10@11am . Pt is having pain and symptoms she thinks are related to pancreatitis. Pt aware of appt date and time.

## 2010-07-09 NOTE — Telephone Encounter (Signed)
Pt states that she has had pancreatitis and has a pseudocyst. She states that she had been doing pretty good until this week. She has had episodes of abdominal pain and back pain at least once everyday this week. Yesterday she had 2 episodes and has already had 1 today. She states the pain is in her abdomen and in her back. Pt feels something is "wrong." Pt wants to know what to do. Dr. Arlyce Dice please advise.

## 2010-07-09 NOTE — Telephone Encounter (Signed)
Pt calling back and states she is having pain every time she eats. Pt states she is not going to be able to make it through the weekend without some more pain medicine. Dr. Arlyce Dice please advise.

## 2010-07-14 ENCOUNTER — Ambulatory Visit: Payer: Managed Care, Other (non HMO) | Admitting: Nurse Practitioner

## 2010-07-21 ENCOUNTER — Emergency Department (HOSPITAL_COMMUNITY): Payer: Managed Care, Other (non HMO)

## 2010-07-21 ENCOUNTER — Emergency Department (HOSPITAL_COMMUNITY)
Admission: EM | Admit: 2010-07-21 | Discharge: 2010-07-21 | Disposition: A | Discharge status: Home / Self Care | Payer: Managed Care, Other (non HMO) | Attending: Emergency Medicine | Admitting: Emergency Medicine

## 2010-07-21 DIAGNOSIS — E119 Type 2 diabetes mellitus without complications: Secondary | ICD-10-CM | POA: Insufficient documentation

## 2010-07-21 DIAGNOSIS — J45909 Unspecified asthma, uncomplicated: Secondary | ICD-10-CM | POA: Insufficient documentation

## 2010-07-21 DIAGNOSIS — R1011 Right upper quadrant pain: Secondary | ICD-10-CM | POA: Insufficient documentation

## 2010-07-21 DIAGNOSIS — K219 Gastro-esophageal reflux disease without esophagitis: Secondary | ICD-10-CM | POA: Insufficient documentation

## 2010-07-21 DIAGNOSIS — E282 Polycystic ovarian syndrome: Secondary | ICD-10-CM | POA: Insufficient documentation

## 2010-07-21 LAB — COMPREHENSIVE METABOLIC PANEL
ALT: 15 U/L (ref 0–35)
AST: 19 U/L (ref 0–37)
Alkaline Phosphatase: 84 U/L (ref 39–117)
CO2: 27 mEq/L (ref 19–32)
Chloride: 105 mEq/L (ref 96–112)
GFR calc Af Amer: >60 mL/min (ref >60)
GFR calc non Af Amer: >60 mL/min (ref >60)
Sodium: 140 mEq/L (ref 135–145)
Total Bilirubin: 0.2 mg/dL — ABNORMAL LOW (ref 0.3–1.2)

## 2010-07-21 LAB — DIFFERENTIAL
Basophils Relative: 0 % (ref 0–1)
Eosinophils Relative: 1 % (ref 0–5)
Lymphs Abs: 1.5 10*3/uL (ref 0.7–4.0)
Monocytes Relative: 7 % (ref 3–12)
Neutrophils Relative %: 63 % (ref 43–77)
Smear Review: ADEQUATE

## 2010-07-21 LAB — CBC
Hemoglobin: 12 g/dL (ref 12.0–15.0)
RBC: 5.36 MIL/uL — ABNORMAL HIGH (ref 3.87–5.11)

## 2010-07-21 LAB — URINALYSIS, ROUTINE W REFLEX MICROSCOPIC
Glucose, UA: NEGATIVE mg/dL
Hgb urine dipstick: NEGATIVE
Ketones, ur: 15 mg/dL — AB
Protein, ur: NEGATIVE mg/dL

## 2010-07-22 ENCOUNTER — Telehealth: Payer: Self-pay | Admitting: Gastroenterology

## 2010-07-22 NOTE — Telephone Encounter (Signed)
Message left for pt to call back.

## 2010-07-22 NOTE — Telephone Encounter (Signed)
Left message for pt to call back.  Pt states she had a lot of abdominal pain yesterday. She went to the ER for the pain. Pt states she is due for another MRI in June but she keeps having pain and she states it seems to be getting worse. She states she wants Dr. Arlyce Dice to drain the cyst or she wants a second opinion, she states she cannot keep going on with this cyst worrying about if it is going to rupture or what. Please advise.

## 2010-07-22 NOTE — Telephone Encounter (Signed)
MRI rescheduled to 07/26/10@8 :30am, pt to arrive at 8am. Pt may have liquids and meds as normal but no solid foods after midnight. Pt aware.

## 2010-07-22 NOTE — Telephone Encounter (Signed)
Lets get the MRI now

## 2010-07-22 NOTE — Telephone Encounter (Signed)
Left message for pt to call back  °

## 2010-07-23 NOTE — Discharge Summary (Signed)
[place]  Patient:    KANDI, BRUSSEAU Visit Number: [id number] MRN: [id number]          Service Type: MED Location: PEDS [id number] Attending Physician:  Delle Reining Dictated by:   Ellwood Handler, M.D. Admit Date:  03/29/2001 Discharge Date: 04/02/2001   CC:         Melinda C. Renae Fickle, M.D.   Discharge Summary  ATTENDING NOT GIVEN  DISCHARGE DIAGNOSES: 1. Pelvic inflammatory disease. 2. Gastroenteritis. 3. High-risk sexual behavior.  DISCHARGE MEDICATIONS:  Doxycycline 100 mg b.i.d. for 14 days.  HISTORY OF PRESENT ILLNESS:  In brief, Ms. Olvey is a 25 year old African American female who presented to [facility] with a one-day history of diffuse noncolicky abdominal pain, nausea, vomiting, and loose watery diarrhea.  The patient did report seeing blood in her stool.  A GYN exam performed on day of admission revealed mild cervical motion tenderness and adnexal tenderness and pain.  She was seen in the [facility] Emergency Room on March 25, 2001 for right upper quadrant abdominal pain.  A GC/chlamydia culture was obtained at that time, which came back positive for chlamydia. The patient received notification of her positive chlamydia status one day prior to admission, and therefore had not received treatment.  She was admitted for further evaluation and management of probable pelvic inflammatory disease.  LABORATORY DATA:  Labs on admission included a WBC of 14.0, hemoglobin 12.9, hematocrit 38.4, platelets 324.  Sodium 137, potassium 3.3, chloride 106, CO2 22, glucose 112, BUN 10, creatinine 0.7, calcium 8.8, total protein 7.2, albumin 3.6, AST 24, ALT 11, alkaline phosphatase 62, total bilirubin 0.6. Urine pregnancy negative.  Urinalysis revealed specific gravity 1.036, ketones greater than 80, protein 30, leukocyte esterase negative, nitrite negative. Wet prep was negative for yeast, Trichomonas, clue  cells, or WBCs.  PROCEDURES: 1. Abdominal and pelvic ultrasounds on March 29, 2001.  Impression:  Normal    study with small simple cyst of right kidney. 2. KUB on March 30, 2001 revealed no acute or specific findings. 3. Transabdominal and transvaginal ultrasound on March 31, 2001 revealed    normal appearance of uterus and both ovaries, small to moderate amount of    free fluid, which is a nonspecific finding, and no evidence of adnexal    masses or cystic lesions.  HOSPITAL COURSE: #1 - PELVIC INFLAMMATORY DISEASE:  The patient was treated with cefotetan 2 mg IV q.12h. and doxycycline 100 mg IV q.12h.  She also received one day treatment with Flagyl 500 mg IV q.6h.  Her abdominal pain gradually improved over the course of hospitalization and by day of discharge the patient no longer complained of abdominal pain.  Given her high risk sexual behavior, a hepatitis panel was obtained and this was negative.  An HIV test was also done and was negative.  The patient was counseled regarding the need to have her sexual partners treated.  #2 - GASTROENTERITIS:  The patient was hydrated with IV fluids for several days.  Her oral intake gradually improved, as did her vomiting and diarrhea. Given her history of bloody diarrhea, we were concerned about inflammatory bowel disease in this patient.  For that reason, we checked an ESR, which was normal at 11, and also checked a C-reactive protein, which was elevated at 10.8.  By day of discharge, the patient was afebrile, well hydrated, and tolerating p.o. well.  She denied any further diarrhea or vomiting.  DISCHARGE CONDITION:  The patient was discharged to home in stable condition. She was instructed to contact her sexual partners and inform them of her positive chlamydia test so that they would be treated.  FOLLOWUP:  A followup appointment was scheduled on April 04, 2001 at [facility], [address] with Dr. Renae Fickle. Dictated  by:   Ellwood Handler, M.D. Attending Physician:  Delle Reining DD:  04/17/01 TD:  04/17/01 Job: [id number]

## 2010-07-26 ENCOUNTER — Telehealth: Payer: Self-pay | Admitting: *Deleted

## 2010-07-26 ENCOUNTER — Ambulatory Visit
Admission: RE | Admit: 2010-07-26 | Discharge: 2010-07-26 | Disposition: A | Discharge status: Home / Self Care | Payer: Managed Care, Other (non HMO) | Source: Ambulatory Visit | Attending: Gastroenterology | Admitting: Gastroenterology

## 2010-07-26 DIAGNOSIS — K859 Acute pancreatitis without necrosis or infection, unspecified: Secondary | ICD-10-CM

## 2010-07-26 MED ORDER — GADOBENATE DIMEGLUMINE 529 MG/ML IV SOLN
16.0000 mL | Freq: Once | INTRAVENOUS | Status: AC | PRN
  Administered 2010-07-26: 16 mL via INTRAVENOUS

## 2010-07-26 NOTE — Telephone Encounter (Signed)
PT HAVING MRI DONE TODAY

## 2010-07-26 NOTE — Telephone Encounter (Signed)
Pt wanted to know when she was going to be seen to discuss the plans following her MRI. Let the pt know that Dr. Arlyce Dice does not have the MRI results yet, let her know when he reviewed the MRI I would let her know.

## 2010-07-26 NOTE — Progress Notes (Signed)
Quick Note:  Has a persistent pancreatic pseudocyst. Have asked IR to review with consideration of drainage ______

## 2010-07-26 NOTE — Telephone Encounter (Signed)
Message copied by Merri Ray on Mon Jul 26, 2010 10:27 AM ------      Message from: Merri Ray      Created: Tue Jul 06, 2010  8:46 AM      Regarding: 6 WEEK F/U MRI       PT NEEDS 6 WEEK F/U MRI FOR PANCREATITIS

## 2010-07-27 ENCOUNTER — Telehealth: Payer: Self-pay | Admitting: Gastroenterology

## 2010-07-27 NOTE — Progress Notes (Signed)
MRI shows a persistent pancreatic pseudocyst which has not changed appreciably in size. I talked with interventional radiology. They did not feel that this can be drained percutaneously because of intervening structures. I will talk to one of the [place] people at [facility] to determine whether they can drain the cyst internally. I would then get back to her.

## 2010-07-27 NOTE — Telephone Encounter (Signed)
Pt is still having pain, has been out of work for 4 days. Had MRI yesterday, results to Dr. Arlyce Dice for review.

## 2010-07-28 MED ORDER — OXYCODONE-ACETAMINOPHEN 5-325 MG PO TABS: 1.0000 | ORAL_TABLET | Freq: Four times a day (QID) | ORAL | Status: DC | PRN

## 2010-07-28 NOTE — Telephone Encounter (Signed)
Pt is calling stating she is still in pain and has been out of work for 5 days. Dr. Arlyce Dice have you heard back from the email you sent yesterday to see if the cyst can be drained by doing an eus? Please advise.

## 2010-07-28 NOTE — Telephone Encounter (Signed)
Bonita Quin,  Please forward a copy of the MRI (see if this can be done electronically with images to Dr. Lanell Matar.  Thanks  -----Original Message-----  From: Truitt Merle [[email or web address]]  Sent: Tuesday, Jul 27, 2010 6:11 PM  To: Melvia Heaps MD; Harolyn Rutherford; Carlisle Beers  Subject: Re: Pancreatic Pseudocyst  Thx rob. Can you send the ct scan.jerry does these here. 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 IR does not feel this can be drained percutaneously. Do you or anyone else do internal pseudocyst drainage? Thanks,  Rob   Pt also states she is in a lot of pain. Per Dr. Arlyce Dice pt can have Oxycodone 5/325 #30. Rx sent.

## 2010-07-29 ENCOUNTER — Telehealth: Payer: Self-pay | Admitting: Gastroenterology

## 2010-07-29 NOTE — Telephone Encounter (Signed)
Let pt know that the films were sent over night fed-ex to [facility] yesterday. We have not heard anything back from [facility] yet.

## 2010-08-03 ENCOUNTER — Telehealth: Payer: Self-pay | Admitting: Gastroenterology

## 2010-08-03 NOTE — Telephone Encounter (Signed)
Let pt know we have not heard back from [facility] yet.

## 2010-08-05 ENCOUNTER — Telehealth: Payer: Self-pay

## 2010-08-05 ENCOUNTER — Telehealth: Payer: Self-pay | Admitting: Gastroenterology

## 2010-08-05 NOTE — Telephone Encounter (Signed)
Spoke with Dr. Jacky Kindle office and they do have the pts MRI records and I was told they will be calling the pt. Called the pt back and let her know what the office said. Gave her the phone number to their office. [phone].

## 2010-08-05 NOTE — Telephone Encounter (Signed)
Please call [facility] and explain the situation including his urgency

## 2010-08-05 NOTE — Telephone Encounter (Signed)
Pt states that she is in a lot of pain and that she has lost 6 pounds. She states she had not heard anything from [facility] from Dr. Jacky Kindle office. According to [facility] they do have her records but Dr. Lanell Matar is out of town. The pt wants to know if there is someone else at [facility] that can see her, she states that she cannot go on this way, she has been out of work for 2 weeks. Please advise.

## 2010-08-06 NOTE — Telephone Encounter (Signed)
Called [facility] and they have the MRI films and the records have been faxed. I have requested that the pt be seen asap. They are reviewing the records and will call pt with appt.

## 2010-08-09 ENCOUNTER — Other Ambulatory Visit: Payer: Managed Care, Other (non HMO)

## 2010-08-10 ENCOUNTER — Telehealth: Payer: Self-pay | Admitting: Gastroenterology

## 2010-08-10 NOTE — Telephone Encounter (Signed)
Dr. Arlyce Dice the pt finally has an appt tomorrow at [facility] with Dr. Logan Bores.  FYI

## 2010-08-11 ENCOUNTER — Emergency Department (HOSPITAL_COMMUNITY)
Admission: EM | Admit: 2010-08-11 | Discharge: 2010-08-12 | Disposition: A | Discharge status: Home / Self Care | Payer: Managed Care, Other (non HMO) | Attending: Emergency Medicine | Admitting: Emergency Medicine

## 2010-08-11 DIAGNOSIS — R1013 Epigastric pain: Secondary | ICD-10-CM | POA: Insufficient documentation

## 2010-08-11 DIAGNOSIS — R112 Nausea with vomiting, unspecified: Secondary | ICD-10-CM | POA: Insufficient documentation

## 2010-08-11 DIAGNOSIS — R1012 Left upper quadrant pain: Secondary | ICD-10-CM | POA: Insufficient documentation

## 2010-08-11 DIAGNOSIS — Z87898 Personal history of other specified conditions: Secondary | ICD-10-CM | POA: Insufficient documentation

## 2010-08-11 DIAGNOSIS — E119 Type 2 diabetes mellitus without complications: Secondary | ICD-10-CM | POA: Insufficient documentation

## 2010-08-11 LAB — COMPREHENSIVE METABOLIC PANEL
Alkaline Phosphatase: 77 U/L (ref 39–117)
BUN: 7 mg/dL (ref 6–23)
Chloride: 101 mEq/L (ref 96–112)
GFR calc non Af Amer: >60 mL/min (ref >60)
Glucose, Bld: 104 mg/dL — ABNORMAL HIGH (ref 70–99)
Potassium: 3.7 mEq/L (ref 3.5–5.1)
Total Bilirubin: 0.3 mg/dL (ref 0.3–1.2)
Total Protein: 7.6 g/dL (ref 6.0–8.3)

## 2010-08-11 LAB — CBC
HCT: 35.7 % — ABNORMAL LOW (ref 36.0–46.0)
MCV: 65.6 fL — ABNORMAL LOW (ref 78.0–100.0)
RDW: 15 % (ref 11.5–15.5)
WBC: 8.1 10*3/uL (ref 4.0–10.5)

## 2010-08-11 LAB — LIPASE, BLOOD: Lipase: 10 U/L — ABNORMAL LOW (ref 11–59)

## 2010-08-11 LAB — DIFFERENTIAL
Eosinophils Relative: 1 % (ref 0–5)
Lymphs Abs: 2.5 10*3/uL (ref 0.7–4.0)
Monocytes Relative: 6 % (ref 3–12)

## 2010-08-12 ENCOUNTER — Emergency Department (HOSPITAL_COMMUNITY): Payer: Managed Care, Other (non HMO)

## 2010-08-18 ENCOUNTER — Other Ambulatory Visit: Payer: Self-pay | Admitting: Gastroenterology

## 2010-08-18 MED ORDER — OXYCODONE-ACETAMINOPHEN 5-325 MG PO TABS: 1.0000 | ORAL_TABLET | Freq: Four times a day (QID) | ORAL | Status: DC | PRN

## 2010-08-18 NOTE — Telephone Encounter (Signed)
ok 

## 2010-08-18 NOTE — Telephone Encounter (Signed)
Faxed rx ok'd by dr Arlyce Dice

## 2010-08-19 ENCOUNTER — Telehealth: Payer: Self-pay | Admitting: *Deleted

## 2010-08-19 ENCOUNTER — Other Ambulatory Visit: Payer: Self-pay | Admitting: Gastroenterology

## 2010-08-19 MED ORDER — OXYCODONE-ACETAMINOPHEN 5-325 MG PO TABS: 1.0000 | ORAL_TABLET | Freq: Four times a day (QID) | ORAL | Status: DC | PRN

## 2010-08-19 NOTE — Telephone Encounter (Signed)
I reprinted rx for pt to pick up. I faxed other rx which should have been picked up. Pharmacy said they old rx in the  in the trash. Pt will pick up prescription today

## 2010-08-19 NOTE — Telephone Encounter (Signed)
Dr Arlyce Dice Iv already prescribed and sent in the other oxycodone...  I think if she wants what [facility] prescribed she should go back to [facility]

## 2010-08-19 NOTE — Telephone Encounter (Signed)
I agree

## 2010-08-19 NOTE — Telephone Encounter (Signed)
L/M for pt that we will not prescribe the other oxycodone. She should go back to [facility]. Per Dr Arlyce Dice

## 2010-08-20 ENCOUNTER — Telehealth: Payer: Self-pay | Admitting: Gastroenterology

## 2010-08-20 NOTE — Telephone Encounter (Signed)
Pt states that she doesn't understand why she is still hurting following the endoscopy she had a [facility], she states she doesn't understand why it still hurts everytime she eats. Suggested pt call [facility] and discuss this with them since they did the procedure. Pt verbalized understanding.

## 2010-08-22 ENCOUNTER — Emergency Department (HOSPITAL_COMMUNITY)
Admission: EM | Admit: 2010-08-22 | Discharge: 2010-08-22 | Disposition: A | Discharge status: Home / Self Care | Payer: Managed Care, Other (non HMO) | Attending: Emergency Medicine | Admitting: Emergency Medicine

## 2010-08-22 DIAGNOSIS — M5412 Radiculopathy, cervical region: Secondary | ICD-10-CM | POA: Insufficient documentation

## 2010-08-22 DIAGNOSIS — E282 Polycystic ovarian syndrome: Secondary | ICD-10-CM | POA: Insufficient documentation

## 2010-08-22 DIAGNOSIS — E119 Type 2 diabetes mellitus without complications: Secondary | ICD-10-CM | POA: Insufficient documentation

## 2010-08-22 DIAGNOSIS — M542 Cervicalgia: Secondary | ICD-10-CM | POA: Insufficient documentation

## 2010-08-22 DIAGNOSIS — Z0389 Encounter for observation for other suspected diseases and conditions ruled out: Secondary | ICD-10-CM | POA: Insufficient documentation

## 2010-08-22 LAB — URINALYSIS, ROUTINE W REFLEX MICROSCOPIC
Glucose, UA: NEGATIVE mg/dL
Hgb urine dipstick: NEGATIVE
Ketones, ur: 15 mg/dL — AB
Protein, ur: NEGATIVE mg/dL

## 2010-08-23 ENCOUNTER — Emergency Department (HOSPITAL_BASED_OUTPATIENT_CLINIC_OR_DEPARTMENT_OTHER)
Admission: EM | Admit: 2010-08-23 | Discharge: 2010-08-24 | Disposition: A | Discharge status: Home / Self Care | Payer: Managed Care, Other (non HMO) | Attending: Emergency Medicine | Admitting: Emergency Medicine

## 2010-08-23 DIAGNOSIS — R1013 Epigastric pain: Secondary | ICD-10-CM | POA: Insufficient documentation

## 2010-08-23 DIAGNOSIS — F17200 Nicotine dependence, unspecified, uncomplicated: Secondary | ICD-10-CM | POA: Insufficient documentation

## 2010-08-23 DIAGNOSIS — K861 Other chronic pancreatitis: Secondary | ICD-10-CM | POA: Insufficient documentation

## 2010-08-23 LAB — URINALYSIS, ROUTINE W REFLEX MICROSCOPIC
Glucose, UA: NEGATIVE mg/dL
Ketones, ur: 15 mg/dL — AB
Leukocytes, UA: NEGATIVE
pH: 5.5 (ref 5.0–8.0)

## 2010-08-23 LAB — CBC
Platelets: 302 10*3/uL (ref 150–400)
RDW: 15.3 % (ref 11.5–15.5)
WBC: 7.2 10*3/uL (ref 4.0–10.5)

## 2010-08-23 LAB — GLUCOSE, CAPILLARY

## 2010-08-24 ENCOUNTER — Telehealth: Payer: Self-pay | Admitting: Gastroenterology

## 2010-08-24 LAB — COMPREHENSIVE METABOLIC PANEL WITH GFR
ALT: 15 U/L (ref 0–35)
AST: 25 U/L (ref 0–37)
Albumin: 3.6 g/dL (ref 3.5–5.2)
Alkaline Phosphatase: 74 U/L (ref 39–117)
BUN: 7 mg/dL (ref 6–23)
CO2: 22 meq/L (ref 19–32)
Calcium: 9.2 mg/dL (ref 8.4–10.5)
Chloride: 106 meq/L (ref 96–112)
Creatinine, Ser: 0.7 mg/dL (ref 0.50–1.10)
GFR calc Af Amer: >60 mL/min
GFR calc non Af Amer: >60 mL/min
Glucose, Bld: 138 mg/dL — ABNORMAL HIGH (ref 70–99)
Potassium: 4.2 meq/L (ref 3.5–5.1)
Sodium: 139 meq/L (ref 135–145)
Total Bilirubin: 0.2 mg/dL — ABNORMAL LOW (ref 0.3–1.2)
Total Protein: 6.9 g/dL (ref 6.0–8.3)

## 2010-08-24 LAB — DIFFERENTIAL
Basophils Absolute: 0 10*3/uL (ref 0.0–0.1)
Eosinophils Absolute: 0 10*3/uL (ref 0.0–0.7)
Eosinophils Relative: 0 % (ref 0–5)
Monocytes Absolute: 0.4 10*3/uL (ref 0.1–1.0)
Neutrophils Relative %: 82 % — ABNORMAL HIGH (ref 43–77)

## 2010-08-24 NOTE — Telephone Encounter (Signed)
Pt called to update [place] on her situation. States she had an attack of pancreatitis yesterday and went to the ER at [facility]. She was treated and released but told to come back if she developed a fever. Pt states she developed a fever last night and went to the [facility]. She was treated there and instructed to call [facility] and to update them with her condition. Pt states she wanted to let Dr. Arlyce Dice know also. Pt to see Dr. Marilynn Rail at [facility] on 08/31/10 for surgical consult regarding her pancreatic pseudocyst.

## 2010-09-06 ENCOUNTER — Telehealth: Payer: Self-pay | Admitting: Gastroenterology

## 2010-09-06 NOTE — Telephone Encounter (Signed)
Message left for pt that she needs to obtain the letter from the surgeon that will be performing the surgery.

## 2010-09-15 ENCOUNTER — Other Ambulatory Visit: Payer: Self-pay | Admitting: Gastroenterology

## 2010-09-15 NOTE — Telephone Encounter (Signed)
We need to know what the status is with her followup at [facility]. If she is being actively followed there than he should pain medicines

## 2010-09-15 NOTE — Telephone Encounter (Signed)
Dr Arlyce Dice, Pt needs Oxycodone refill. Can I give her a script

## 2010-09-16 MED ORDER — OXYCODONE-ACETAMINOPHEN 5-325 MG PO TABS: 1.0000 | ORAL_TABLET | Freq: Four times a day (QID) | ORAL | Status: DC | PRN

## 2010-09-16 NOTE — Telephone Encounter (Signed)
[facility] and pt is having surgery with Dr. Marilynn Rail 09/24/10. Left message for office to call back regarding refilling of pain medication. Received call back from [facility]. Dr. Marilynn Rail is out of town this week, his office requests that we refill her pain medication to get her through till her surgery on 09/24/10 and then they would take over. Dr. Arlyce Dice is this ok with you.Marland KitchenMarland KitchenPlease advise.

## 2010-09-16 NOTE — Telephone Encounter (Signed)
Rx left up front for pt to pick up.

## 2010-09-16 NOTE — Telephone Encounter (Signed)
yes

## 2011-01-18 ENCOUNTER — Emergency Department (HOSPITAL_COMMUNITY)
Admission: EM | Admit: 2011-01-18 | Discharge: 2011-01-19 | Discharge status: Against Medical Advice | Payer: Managed Care, Other (non HMO) | Attending: Emergency Medicine | Admitting: Emergency Medicine

## 2011-01-18 ENCOUNTER — Encounter (HOSPITAL_COMMUNITY): Payer: Self-pay | Admitting: Emergency Medicine

## 2011-01-18 DIAGNOSIS — R109 Unspecified abdominal pain: Secondary | ICD-10-CM | POA: Insufficient documentation

## 2011-01-18 DIAGNOSIS — R110 Nausea: Secondary | ICD-10-CM | POA: Insufficient documentation

## 2011-01-18 DIAGNOSIS — M549 Dorsalgia, unspecified: Secondary | ICD-10-CM | POA: Insufficient documentation

## 2011-01-18 NOTE — ED Notes (Signed)
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

## 2011-01-19 ENCOUNTER — Encounter (HOSPITAL_BASED_OUTPATIENT_CLINIC_OR_DEPARTMENT_OTHER): Payer: Self-pay | Admitting: *Deleted

## 2011-01-19 ENCOUNTER — Emergency Department (HOSPITAL_BASED_OUTPATIENT_CLINIC_OR_DEPARTMENT_OTHER)
Admission: EM | Admit: 2011-01-19 | Discharge: 2011-01-19 | Disposition: A | Discharge status: Home / Self Care | Payer: Managed Care, Other (non HMO) | Attending: Emergency Medicine | Admitting: Emergency Medicine

## 2011-01-19 ENCOUNTER — Emergency Department (HOSPITAL_COMMUNITY): Payer: Managed Care, Other (non HMO)

## 2011-01-19 DIAGNOSIS — N938 Other specified abnormal uterine and vaginal bleeding: Secondary | ICD-10-CM | POA: Insufficient documentation

## 2011-01-19 DIAGNOSIS — K219 Gastro-esophageal reflux disease without esophagitis: Secondary | ICD-10-CM | POA: Insufficient documentation

## 2011-01-19 DIAGNOSIS — R109 Unspecified abdominal pain: Secondary | ICD-10-CM | POA: Insufficient documentation

## 2011-01-19 DIAGNOSIS — N949 Unspecified condition associated with female genital organs and menstrual cycle: Secondary | ICD-10-CM | POA: Insufficient documentation

## 2011-01-19 DIAGNOSIS — J45909 Unspecified asthma, uncomplicated: Secondary | ICD-10-CM | POA: Insufficient documentation

## 2011-01-19 DIAGNOSIS — F17200 Nicotine dependence, unspecified, uncomplicated: Secondary | ICD-10-CM | POA: Insufficient documentation

## 2011-01-19 LAB — CBC
HCT: 36 % (ref 36.0–46.0)
Hemoglobin: 12 g/dL (ref 12.0–15.0)
MCHC: 33.3 g/dL (ref 30.0–36.0)
MCV: 64.3 fL — ABNORMAL LOW (ref 78.0–100.0)
RDW: 17.1 % — ABNORMAL HIGH (ref 11.5–15.5)

## 2011-01-19 LAB — COMPREHENSIVE METABOLIC PANEL
ALT: 12 U/L (ref 0–35)
CO2: 27 mEq/L (ref 19–32)
Calcium: 9.4 mg/dL (ref 8.4–10.5)
Chloride: 102 mEq/L (ref 96–112)
GFR calc Af Amer: >90 mL/min (ref >90)
GFR calc non Af Amer: >90 mL/min (ref >90)
Glucose, Bld: 116 mg/dL — ABNORMAL HIGH (ref 70–99)
Sodium: 137 mEq/L (ref 135–145)
Total Bilirubin: 0.3 mg/dL (ref 0.3–1.2)

## 2011-01-19 LAB — DIFFERENTIAL
Basophils Absolute: 0 10*3/uL (ref 0.0–0.1)
Basophils Relative: 0 % (ref 0–1)
Eosinophils Absolute: 0.1 10*3/uL (ref 0.0–0.7)
Lymphs Abs: 2.6 10*3/uL (ref 0.7–4.0)
Monocytes Relative: 8 % (ref 3–12)
Neutro Abs: 4.7 10*3/uL (ref 1.7–7.7)

## 2011-01-19 LAB — GC/CHLAMYDIA PROBE AMP, GENITAL
Chlamydia, DNA Probe: NEGATIVE
GC Probe Amp, Genital: NEGATIVE

## 2011-01-19 LAB — URINE MICROSCOPIC-ADD ON

## 2011-01-19 LAB — URINALYSIS, ROUTINE W REFLEX MICROSCOPIC
Bilirubin Urine: NEGATIVE
Glucose, UA: NEGATIVE mg/dL
Ketones, ur: 15 mg/dL — AB
Protein, ur: NEGATIVE mg/dL
Urobilinogen, UA: 1 mg/dL (ref 0.0–1.0)

## 2011-01-19 LAB — WET PREP, GENITAL

## 2011-01-19 MED ORDER — HYDROCODONE-ACETAMINOPHEN 5-325 MG PO TABS: 2.0000 | ORAL_TABLET | ORAL | Status: AC | PRN

## 2011-01-19 MED ORDER — IBUPROFEN 600 MG PO TABS: 600.0000 mg | ORAL_TABLET | Freq: Four times a day (QID) | ORAL | Status: AC | PRN

## 2011-01-19 MED ORDER — OXYCODONE-ACETAMINOPHEN 5-325 MG PO TABS
1.0000 | ORAL_TABLET | Freq: Once | ORAL | Status: AC
  Administered 2011-01-19: 1 via ORAL
  Filled 2011-01-19: qty 1

## 2011-01-19 NOTE — ED Notes (Signed)
Pt presents to ED today after leaving [facility] after 4hrs.  Pt was never eval'd by EDP or placed in room.  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  Pt reports bleeding started today and has steadily gotten worse.

## 2011-01-19 NOTE — ED Provider Notes (Signed)
History     CSN: [id number] Arrival date & time: 01/19/2011  3:30 AM   First MD Initiated Contact with Patient 01/19/11 0401      Chief Complaint  Patient presents with  . Abdominal Cramping  . Vaginal Bleeding    (Consider location/radiation/quality/duration/timing/severity/associated sxs/prior treatment) HPI ([id number] )Patient reports development of lower abdominal cramping and vaginal bleeding since last night.  She reports that her pad every hour.  She reports 2 prior pregnancies both resulting in miscarriages in the first several weeks.  She reports chills without fever.  She denies upper abdominal pain at this time.  She denies nausea vomiting diarrhea.  She presented initially to an outside hospital emergency department because of the long wait came to the ER for evaluation.  She reports for the last several months has had irregular periods and at times heavy vaginal bleeding.  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  This was performed at [facility].  She denies dysuria and urinary frequency.  Past Medical History  Diagnosis Date  . GERD (gastroesophageal reflux disease)   . Asthma   . Pelvic inflammatory disease   . Pancreatitis   . Polycystic ovary syndrome     Past Surgical History  Procedure Date  . Splenectomy, total   . Pancreas surgery     Family History  Problem Relation Age of Onset  . Diabetes Mother   . Colon cancer Neg Hx     History  Substance Use Topics  . Smoking status: Current Everyday Smoker -- 0.5 packs/day    Types: Cigarettes  . Smokeless tobacco: Never Used  . Alcohol Use: Yes    OB History    Grav Para Term Preterm Abortions TAB SAB Ect Mult Living                  Review of Systems  All other systems reviewed and are negative.    Allergies  Review of patient's allergies indicates no known allergies.  Home Medications   Current  Outpatient Rx  Name Route Sig Dispense Refill  . ALBUTEROL SULFATE HFA 108 (90 BASE) MCG/ACT IN AERS Inhalation Inhale 2 puffs into the lungs every 6 (six) hours as needed. Shortness of breath    . CYCLOBENZAPRINE HCL 10 MG PO TABS Oral Take 10 mg by mouth 3 (three) times daily as needed. Took 1/4 of tablet     . OXYCODONE-ACETAMINOPHEN 5-325 MG PO TABS Oral Take 1 tablet by mouth every 6 (six) hours as needed for pain. 30 tablet 0    BP 109/76  Pulse 76  Temp(Src) 98 F (36.7 C) (Oral)  Resp 20  Ht 5' (1.524 m)  Wt 155 lb (70.308 kg)  BMI 30.27 kg/m2  SpO2 98%  LMP 01/17/2011  Physical Exam  Nursing note and vitals reviewed. Constitutional: She is oriented to person, place, and time. She appears well-developed and well-nourished. No distress.  HENT:  Head: Normocephalic and atraumatic.  Eyes: EOM are normal.  Neck: Normal range of motion.  Cardiovascular: Normal rate, regular rhythm and normal heart sounds.   Pulmonary/Chest: Effort normal and breath sounds normal.  Abdominal: Soft. She exhibits no distension. There is no tenderness.  Genitourinary:       Normal external genitalia.  No adnexal masses or tenderness palpated on exam.  A small amount of vaginal blood with small clots in the vagina.  This appears to  be coming from the closed cervix.  No cervical motion tenderness.  She otherwise has a normal appearing cervix without cervical erosions or other abnormalities noted  Musculoskeletal: Normal range of motion.  Neurological: She is alert and oriented to person, place, and time.  Skin: Skin is warm and dry.  Psychiatric: She has a normal mood and affect. Judgment normal.    ED Course  Procedures (including critical care time)  Labs Reviewed  URINALYSIS, ROUTINE W REFLEX MICROSCOPIC - Abnormal; Notable for the following:    Hgb urine dipstick MODERATE (*)    Ketones, ur 15 (*)    All other components within normal limits  WET PREP, GENITAL - Abnormal; Notable for the  following:    Clue Cells, Wet Prep FEW (*)    WBC, Wet Prep HPF POC FEW (*) MODERATE BACTERIA SEEN   All other components within normal limits  PREGNANCY, URINE  URINE MICROSCOPIC-ADD ON  GC/CHLAMYDIA PROBE AMP, GENITAL   I personally reviewed the laboratory data as well as her prior data from the earlier ER visit  1. Dysfunctional uterine bleeding       MDM  May represent dysfunctional uterine bleeding.  Urine pregnancy pending at this time.  4:46 AM Discomfort improved.  Urine pregnancy test is negative.  Urinalysis appears normal.  Labs reviewed from a sitting long hospital are normal.  Acute abdominal series was performed there as well demonstrated no abdominal acute pathology on x-ray.  Discharge home with close follow up with her OB/GYN.        Lyanne Co, MD 01/19/11 [phone]

## 2011-01-23 NOTE — ED Provider Notes (Signed)
History     CSN: [id number] Arrival date & time: 01/18/2011 10:33 PM   First MD Initiated Contact with Patient 01/18/11 2311      Chief Complaint  Patient presents with  . Abdominal Pain    (Consider location/radiation/quality/duration/timing/severity/associated sxs/prior treatment) HPI  Past Medical History  Diagnosis Date  . GERD (gastroesophageal reflux disease)   . Asthma   . Pelvic inflammatory disease   . Pancreatitis   . Polycystic ovary syndrome     Past Surgical History  Procedure Date  . Splenectomy, total   . Pancreas surgery     Family History  Problem Relation Age of Onset  . Diabetes Mother   . Colon cancer Neg Hx     History  Substance Use Topics  . Smoking status: Current Everyday Smoker -- 0.5 packs/day    Types: Cigarettes  . Smokeless tobacco: Never Used  . Alcohol Use: Yes    OB History    Grav Para Term Preterm Abortions TAB SAB Ect Mult Living                  Review of Systems  Allergies  Review of patient's allergies indicates no known allergies.  Home Medications   Current Outpatient Rx  Name Route Sig Dispense Refill  . ALBUTEROL SULFATE HFA 108 (90 BASE) MCG/ACT IN AERS Inhalation Inhale 2 puffs into the lungs every 6 (six) hours as needed. Shortness of breath    . CYCLOBENZAPRINE HCL 10 MG PO TABS Oral Take 10 mg by mouth 3 (three) times daily as needed. Took 1/4 of tablet     . HYDROCODONE-ACETAMINOPHEN 5-325 MG PO TABS Oral Take 2 tablets by mouth every 4 (four) hours as needed for pain. 10 tablet 0  . IBUPROFEN 600 MG PO TABS Oral Take 1 tablet (600 mg total) by mouth every 6 (six) hours as needed for pain. 10 tablet 0  . OXYCODONE-ACETAMINOPHEN 5-325 MG PO TABS Oral Take 1 tablet by mouth every 6 (six) hours as needed for pain. 30 tablet 0    BP 101/60  Pulse 71  Temp(Src) 98.7 F (37.1 C) (Oral)  Resp 15  SpO2 100%  LMP 01/17/2011  Physical Exam  ED Course  Procedures (including critical care time)  Labs  Reviewed  CBC - Abnormal; Notable for the following:    RBC 5.60 (*)    MCV 64.3 (*)    MCH 21.4 (*)    RDW 17.1 (*)    Platelets 592 (*)    All other components within normal limits  COMPREHENSIVE METABOLIC PANEL - Abnormal; Notable for the following:    Glucose, Bld 116 (*)    BUN 5 (*)    All other components within normal limits  DIFFERENTIAL  LIPASE, BLOOD  LAB REPORT - SCANNED   No results found.   No diagnosis found.    MDM  I did not see this patient Donnetta Hutching M.D.        Donnetta Hutching, MD 01/23/11 1535

## 2011-03-13 ENCOUNTER — Emergency Department (HOSPITAL_BASED_OUTPATIENT_CLINIC_OR_DEPARTMENT_OTHER)
Admission: EM | Admit: 2011-03-13 | Discharge: 2011-03-13 | Disposition: A | Discharge status: Home / Self Care | Payer: Managed Care, Other (non HMO) | Attending: Emergency Medicine | Admitting: Emergency Medicine

## 2011-03-13 ENCOUNTER — Encounter (HOSPITAL_BASED_OUTPATIENT_CLINIC_OR_DEPARTMENT_OTHER): Payer: Self-pay | Admitting: *Deleted

## 2011-03-13 DIAGNOSIS — K219 Gastro-esophageal reflux disease without esophagitis: Secondary | ICD-10-CM | POA: Insufficient documentation

## 2011-03-13 DIAGNOSIS — F17200 Nicotine dependence, unspecified, uncomplicated: Secondary | ICD-10-CM | POA: Insufficient documentation

## 2011-03-13 DIAGNOSIS — J45909 Unspecified asthma, uncomplicated: Secondary | ICD-10-CM | POA: Insufficient documentation

## 2011-03-13 DIAGNOSIS — L989 Disorder of the skin and subcutaneous tissue, unspecified: Secondary | ICD-10-CM | POA: Insufficient documentation

## 2011-03-13 DIAGNOSIS — S90569A Insect bite (nonvenomous), unspecified ankle, initial encounter: Secondary | ICD-10-CM | POA: Insufficient documentation

## 2011-03-13 MED ORDER — DIPHENHYDRAMINE HCL 25 MG PO CAPS
25.0000 mg | ORAL_CAPSULE | Freq: Once | ORAL | Status: AC
  Administered 2011-03-13: 25 mg via ORAL
  Filled 2011-03-13 (×2): qty 1

## 2011-03-13 MED ORDER — CEPHALEXIN 500 MG PO CAPS: 500.0000 mg | ORAL_CAPSULE | Freq: Four times a day (QID) | ORAL | Status: AC

## 2011-03-13 NOTE — ED Provider Notes (Signed)
History     CSN: [id number]  Arrival date & time 03/13/11  1334   First MD Initiated Contact with Patient 03/13/11 1352      Chief Complaint  Patient presents with  . Insect Bite    (Consider location/radiation/quality/duration/timing/severity/associated sxs/prior treatment) HPI Patient comes in today with red tender area on her left leg just above the knee. She noted it yesterday and thinks it may be an insect bite. She states it stings. She did not have any known trauma the area. It is slightly raised and she states this has occurred over the past 24 hours. It is a small area and has not increased in area covered. She states she is concerned because she has had her spleen out. Past Medical History  Diagnosis Date  . GERD (gastroesophageal reflux disease)   . Asthma   . Pelvic inflammatory disease   . Pancreatitis   . Polycystic ovary syndrome     Past Surgical History  Procedure Date  . Splenectomy, total   . Pancreas surgery     Family History  Problem Relation Age of Onset  . Diabetes Mother   . Colon cancer Neg Hx     History  Substance Use Topics  . Smoking status: Current Everyday Smoker -- 0.5 packs/day    Types: Cigarettes  . Smokeless tobacco: Never Used  . Alcohol Use: 1.8 oz/week    3 Glasses of wine per week    OB History    Grav Para Term Preterm Abortions TAB SAB Ect Mult Living                  Review of Systems  All other systems reviewed and are negative.    Allergies  Review of patient's allergies indicates no known allergies.  Home Medications   Current Outpatient Rx  Name Route Sig Dispense Refill  . ALBUTEROL SULFATE HFA 108 (90 BASE) MCG/ACT IN AERS Inhalation Inhale 2 puffs into the lungs every 6 (six) hours as needed. Shortness of breath    . CYCLOBENZAPRINE HCL 10 MG PO TABS Oral Take 10 mg by mouth 3 (three) times daily as needed. Took 1/4 of tablet     . OXYCODONE-ACETAMINOPHEN 5-325 MG PO TABS Oral Take 1 tablet by mouth  every 6 (six) hours as needed for pain. 30 tablet 0    BP 115/80  Pulse 80  Temp(Src) 98.8 F (37.1 C) (Oral)  Resp 18  Ht 5' (1.524 m)  Wt 146 lb (66.225 kg)  BMI 28.51 kg/m2  SpO2 100%  LMP 02/23/2011  Physical Exam  Vitals reviewed. Constitutional: She is oriented to person, place, and time. She appears well-developed and well-nourished.  HENT:  Head: Normocephalic and atraumatic.  Eyes: Conjunctivae and EOM are normal. Pupils are equal, round, and reactive to light.  Neck: Normal range of motion. Neck supple.  Cardiovascular: Normal rate and regular rhythm.   Pulmonary/Chest: Effort normal and breath sounds normal.  Abdominal: Soft. Bowel sounds are normal.  Musculoskeletal:       Small erythematous area 0.5 x 0.25 cm above left knee and just medial to the knee cap. It is slightly tender there are no other lesions noted.  Neurological: She is alert and oriented to person, place, and time. She has normal reflexes.  Psychiatric: She has a normal mood and affect.    ED Course  Procedures (including critical care time)  Labs Reviewed - No data to display No results found.   No diagnosis found.  MDM         Hilario Quarry, MD 03/13/11 1415

## 2011-03-13 NOTE — ED Notes (Signed)
Reports ?spider bite to left thigh- noticed area yesterday

## 2011-08-29 ENCOUNTER — Encounter (HOSPITAL_COMMUNITY): Payer: Self-pay | Admitting: *Deleted

## 2011-08-29 ENCOUNTER — Emergency Department (HOSPITAL_COMMUNITY)
Admission: EM | Admit: 2011-08-29 | Discharge: 2011-08-29 | Discharge status: Against Medical Advice | Payer: Medicaid Other | Attending: Emergency Medicine | Admitting: Emergency Medicine

## 2011-08-29 DIAGNOSIS — R42 Dizziness and giddiness: Secondary | ICD-10-CM | POA: Insufficient documentation

## 2011-08-29 LAB — COMPREHENSIVE METABOLIC PANEL
ALT: 27 U/L (ref 0–35)
AST: 34 U/L (ref 0–37)
Alkaline Phosphatase: 83 U/L (ref 39–117)
BUN: 9 mg/dL (ref 6–23)
Calcium: 9.9 mg/dL (ref 8.4–10.5)
Creatinine, Ser: 0.71 mg/dL (ref 0.50–1.10)
GFR calc Af Amer: >90 mL/min (ref >90)
GFR calc non Af Amer: >90 mL/min (ref >90)
Total Bilirubin: 0.7 mg/dL (ref 0.3–1.2)

## 2011-08-29 LAB — URINALYSIS, ROUTINE W REFLEX MICROSCOPIC
Hgb urine dipstick: NEGATIVE
Ketones, ur: 40 mg/dL — AB
Nitrite: NEGATIVE
Protein, ur: NEGATIVE mg/dL
Specific Gravity, Urine: 1.025 (ref 1.005–1.030)
Urobilinogen, UA: 0.2 mg/dL (ref 0.0–1.0)

## 2011-08-29 LAB — CBC
MCH: 22.3 pg — ABNORMAL LOW (ref 26.0–34.0)
MCV: 66 fL — ABNORMAL LOW (ref 78.0–100.0)
Platelets: 501 10*3/uL — ABNORMAL HIGH (ref 150–400)
RDW: 18 % — ABNORMAL HIGH (ref 11.5–15.5)
WBC: 4.4 10*3/uL (ref 4.0–10.5)

## 2011-08-29 LAB — URINE MICROSCOPIC-ADD ON

## 2011-08-29 LAB — DIFFERENTIAL
Basophils Absolute: 0 10*3/uL (ref 0.0–0.1)
Lymphs Abs: 0.8 10*3/uL (ref 0.7–4.0)
Monocytes Absolute: 0.8 10*3/uL (ref 0.1–1.0)
Monocytes Relative: 18 % — ABNORMAL HIGH (ref 3–12)

## 2011-08-29 NOTE — ED Notes (Signed)
Pt called x2. Unable to locate. 

## 2011-08-29 NOTE — ED Notes (Signed)
Pt called x3. Unable to locate. 

## 2011-08-29 NOTE — ED Notes (Signed)
She has had some dizziness for the past 2 days with vomiting this am with specs of blood  This am.  No bm in 3 days,  lmp  Last week.

## 2011-08-29 NOTE — ED Notes (Signed)
Pt called x1. Unable to locate. 

## 2011-11-09 ENCOUNTER — Inpatient Hospital Stay (HOSPITAL_COMMUNITY)
Admission: AD | Admit: 2011-11-09 | Discharge: 2011-11-09 | Disposition: A | Discharge status: Home / Self Care | Payer: Medicaid Other | Source: Ambulatory Visit | Attending: Obstetrics & Gynecology | Admitting: Obstetrics & Gynecology

## 2011-11-09 ENCOUNTER — Encounter (HOSPITAL_COMMUNITY): Payer: Self-pay

## 2011-11-09 DIAGNOSIS — N949 Unspecified condition associated with female genital organs and menstrual cycle: Secondary | ICD-10-CM | POA: Insufficient documentation

## 2011-11-09 DIAGNOSIS — B9689 Other specified bacterial agents as the cause of diseases classified elsewhere: Secondary | ICD-10-CM | POA: Insufficient documentation

## 2011-11-09 DIAGNOSIS — A499 Bacterial infection, unspecified: Secondary | ICD-10-CM | POA: Insufficient documentation

## 2011-11-09 DIAGNOSIS — N760 Acute vaginitis: Secondary | ICD-10-CM

## 2011-11-09 HISTORY — DX: Cyst of pancreas: K86.2

## 2011-11-09 LAB — URINALYSIS, ROUTINE W REFLEX MICROSCOPIC
Bilirubin Urine: NEGATIVE
Ketones, ur: NEGATIVE mg/dL
Leukocytes, UA: NEGATIVE
Nitrite: NEGATIVE
Specific Gravity, Urine: >1.03 — ABNORMAL HIGH (ref 1.005–1.030)
Urobilinogen, UA: 0.2 mg/dL (ref 0.0–1.0)
pH: 6 (ref 5.0–8.0)

## 2011-11-09 LAB — WET PREP, GENITAL

## 2011-11-09 MED ORDER — METRONIDAZOLE 500 MG PO TABS: 500.0000 mg | ORAL_TABLET | Freq: Two times a day (BID) | ORAL | Status: AC

## 2011-11-09 NOTE — MAU Provider Note (Signed)
Chief Complaint: Possible Pregnancy   First Provider Initiated Contact with Patient 11/09/11 1814      SUBJECTIVE HPI: [name] is a 26 y.o. G1P0 at [redacted]w[redacted]d by LMP who presents with amenorrhea, LMP 08/15/11, HA and vaginal discharge w/ odor.  Has not taken UPT. Denies abd pain, fever, vaginal bleeding.   Past Medical History  Diagnosis Date  . GERD (gastroesophageal reflux disease)   . Asthma   . Pelvic inflammatory disease   . Pancreatitis   . Polycystic ovary syndrome   . Pancreatic cyst    OB History    Grav Para Term Preterm Abortions TAB SAB Ect Mult Living   1              # Outc Date GA Lbr Len/2nd Wgt Sex Del Anes PTL Lv   1 CUR              Past Surgical History  Procedure Date  . Splenectomy, total   . Pancreas surgery    History   Social History  . Marital Status: Single    Spouse Name: N/A    Number of Children: N/A  . Years of Education: N/A   Occupational History  . Not on file.   Social History Main Topics  . Smoking status: Current Everyday Smoker -- 0.2 packs/day    Types: Cigarettes  . Smokeless tobacco: Never Used  . Alcohol Use: 1.8 oz/week    3 Glasses of wine per week  . Drug Use: No  . Sexually Active: Yes    Birth Control/ Protection: Condom   Other Topics Concern  . Not on file   Social History Narrative  . No narrative on file   No current facility-administered medications on file prior to encounter.   Current Outpatient Prescriptions on File Prior to Encounter  Medication Sig Dispense Refill  . acetaminophen (TYLENOL) 325 MG tablet Take 650 mg by mouth every 6 (six) hours as needed. For pain, headache      . albuterol (PROAIR HFA) 108 (90 BASE) MCG/ACT inhaler Inhale 2 puffs into the lungs every 6 (six) hours as needed. Shortness of breath      . oxyCODONE-acetaminophen (PERCOCET) 5-325 MG per tablet Take 1 tablet by mouth every 6 (six) hours as needed. For pain       Allergies  Allergen Reactions  . Dilaudid  (Hydromorphone Hcl) Itching and Other (See Comments)    "Felt like she was losing her mind"    ROS: Pertinent items in HPI  OBJECTIVE Blood pressure 123/80, pulse 79, temperature 98 F (36.7 C), temperature source Oral, resp. rate 16, height 5' (1.524 m), weight 66.452 kg (146 lb 8 oz), last menstrual period 08/15/2011. GENERAL: Well-developed, well-nourished female in no acute distress.  HEENT: Normocephalic HEART: normal rate RESP: normal effort ABDOMEN: Soft, non-tender EXTREMITIES: Nontender, no edema NEURO: Alert and oriented SPECULUM EXAM: NEFG, moderate amount of thin, white, malodorous discharge, no blood noted, cervix clean BIMANUAL: cervix closed; uterus 15 week size, no adnexal tenderness or masses FHR: 167 per dopper  LAB RESULTS Results for orders placed during the hospital encounter of 11/09/11 (from the past 24 hour(s))  URINALYSIS, ROUTINE W REFLEX MICROSCOPIC     Status: Abnormal   Collection Time   11/09/11  2:10 PM      Component Value Range   Color, Urine YELLOW  YELLOW   APPearance CLOUDY (*) CLEAR   Specific Gravity, Urine >1.030 (*) 1.005 - 1.030   pH  6.0  5.0 - 8.0   Glucose, UA NEGATIVE  NEGATIVE mg/dL   Hgb urine dipstick NEGATIVE  NEGATIVE   Bilirubin Urine NEGATIVE  NEGATIVE   Ketones, ur NEGATIVE  NEGATIVE mg/dL   Protein, ur NEGATIVE  NEGATIVE mg/dL   Urobilinogen, UA 0.2  0.0 - 1.0 mg/dL   Nitrite NEGATIVE  NEGATIVE   Leukocytes, UA NEGATIVE  NEGATIVE  POCT PREGNANCY, URINE     Status: Abnormal   Collection Time   11/09/11  4:46 PM      Component Value Range   Preg Test, Ur POSITIVE (*) NEGATIVE    IMAGING No results found.  ASSESSMENT 1. BV (bacterial vaginosis)    PLAN Discharge home Follow-up Information    Follow up with Brock Bad, MD.   Contact information:   [address] [place] [address] [phone]       Follow up with [facility] [place]. (As  needed if symptoms worsen)    Contact information:   [address] [place] [address] [phone]        Medication List     As of 11/24/2011 12:29 AM    TAKE these medications         acetaminophen 325 MG tablet   Commonly known as: TYLENOL   Take 650 mg by mouth every 6 (six) hours as needed. For pain, headache      oxyCODONE-acetaminophen 5-325 MG per tablet   Commonly known as: PERCOCET/ROXICET   Take 1 tablet by mouth every 6 (six) hours as needed. For pain      PROAIR HFA 108 (90 BASE) MCG/ACT inhaler   Generic drug: albuterol   Inhale 2 puffs into the lungs every 6 (six) hours as needed. Shortness of breath      Flagyl 500 mg BID x 7 days [place], [place] 11/09/2011  6:12 PM

## 2011-11-09 NOTE — MAU Note (Cosign Needed)
Pt states no menstrual cycle since June, has gained 16 lbs. LMP-08/15/2011 roughly. Has vaginal discharge with fishy odor x3 weeks. Thick discharge.

## 2011-11-09 NOTE — MAU Note (Signed)
Pt presents for HA, and not having a period since June.  Has had a 16lb weight gain since June.

## 2011-11-10 LAB — GC/CHLAMYDIA PROBE AMP, GENITAL: GC Probe Amp, Genital: NEGATIVE

## 2011-12-13 ENCOUNTER — Inpatient Hospital Stay (HOSPITAL_COMMUNITY): Payer: Medicaid Other

## 2011-12-13 ENCOUNTER — Inpatient Hospital Stay (HOSPITAL_COMMUNITY)
Admission: AD | Admit: 2011-12-13 | Discharge: 2011-12-13 | Disposition: A | Discharge status: Home / Self Care | Payer: Medicaid Other | Source: Ambulatory Visit | Attending: Obstetrics & Gynecology | Admitting: Obstetrics & Gynecology

## 2011-12-13 ENCOUNTER — Encounter (HOSPITAL_COMMUNITY): Payer: Self-pay

## 2011-12-13 DIAGNOSIS — A499 Bacterial infection, unspecified: Secondary | ICD-10-CM | POA: Insufficient documentation

## 2011-12-13 DIAGNOSIS — B9689 Other specified bacterial agents as the cause of diseases classified elsewhere: Secondary | ICD-10-CM | POA: Insufficient documentation

## 2011-12-13 DIAGNOSIS — O99891 Other specified diseases and conditions complicating pregnancy: Secondary | ICD-10-CM | POA: Insufficient documentation

## 2011-12-13 DIAGNOSIS — O2390 Unspecified genitourinary tract infection in pregnancy, unspecified trimester: Secondary | ICD-10-CM | POA: Insufficient documentation

## 2011-12-13 DIAGNOSIS — R51 Headache: Secondary | ICD-10-CM | POA: Insufficient documentation

## 2011-12-13 DIAGNOSIS — N760 Acute vaginitis: Secondary | ICD-10-CM | POA: Insufficient documentation

## 2011-12-13 DIAGNOSIS — Z3490 Encounter for supervision of normal pregnancy, unspecified, unspecified trimester: Secondary | ICD-10-CM

## 2011-12-13 DIAGNOSIS — R739 Hyperglycemia, unspecified: Secondary | ICD-10-CM

## 2011-12-13 DIAGNOSIS — R7309 Other abnormal glucose: Secondary | ICD-10-CM | POA: Insufficient documentation

## 2011-12-13 MED ORDER — GLYBURIDE 2.5 MG PO TABS: 2.5000 mg | ORAL_TABLET | Freq: Every day | ORAL | Status: DC

## 2011-12-13 MED ORDER — PRENATAL PLUS 27-1 MG PO TABS: 1.0000 | ORAL_TABLET | Freq: Every day | ORAL | Status: DC

## 2011-12-13 MED ORDER — ACETAMINOPHEN 500 MG PO TABS
1000.0000 mg | ORAL_TABLET | Freq: Once | ORAL | Status: AC
  Administered 2011-12-13: 1000 mg via ORAL
  Filled 2011-12-13: qty 2

## 2011-12-13 MED ORDER — METRONIDAZOLE 500 MG PO TABS: 500.0000 mg | ORAL_TABLET | Freq: Two times a day (BID) | ORAL | Status: DC

## 2011-12-13 NOTE — MAU Provider Note (Signed)
History     CSN: [id number]  Arrival date and time: 12/13/11 1191   First Provider Initiated Contact with Patient 12/13/11 1045      Chief Complaint  Patient presents with  . Headache   HPI  [name] 26 y.o. G1P0. [redacted]w[redacted]d by LMP.  Headache- Patient present with 1 month history of headaches that she describes as being constant, bilateral temporal, frontal, and occipital. She has also had some photophobia and dizziness with low blood pressures. She has tried taking one 500mg  tylenol with little relief, but was afraid to take more than this because of TV commercials. 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 Her blood sugars were being followed at [facility], however she has not been able to keep her appointments due to lack of insurance coverage. She has not had any pre-natal care up to this point either. She reports that her fasting blood glucose readings range 122-156 and her 1 hour post-prandials range 150-180. She does not currently take any medications or insulin because her blood sugars were normal until she got pregnant.   BV- Patient was seen here on 11/09/11 and treated for BV. She was given a script for flagyl, however she has not been able to fill the script due to lack of finances. She has since then lost the script and needs a new one. She is still symptomatic with vaginal discharge.    OB History    Grav Para Term Preterm Abortions TAB SAB Ect Mult Living   1               Past Medical History  Diagnosis Date  . GERD (gastroesophageal reflux disease)   . Asthma   . Pelvic inflammatory disease   . Pancreatitis   . Polycystic ovary syndrome   . Pancreatic cyst     Past Surgical History  Procedure Date  . Splenectomy, total   . Pancreas surgery     Family History  Problem Relation Age of Onset  . Diabetes  Mother   . Colon cancer Neg Hx     History  Substance Use Topics  . Smoking status: Current Every Day Smoker -- 0.2 packs/day    Types: Cigarettes  . Smokeless tobacco: Never Used  . Alcohol Use: No    Allergies:  Allergies  Allergen Reactions  . Dilaudid (Hydromorphone Hcl) Itching and Other (See Comments)    "Felt like she was losing her mind"    Prescriptions prior to admission  Medication Sig Dispense Refill  . acetaminophen (TYLENOL) 325 MG tablet Take 650 mg by mouth every 6 (six) hours as needed. For pain, headache      . albuterol (PROAIR HFA) 108 (90 BASE) MCG/ACT inhaler Inhale 2 puffs into the lungs every 6 (six) hours as needed. Rescue inhaler        Review of Systems  Constitutional: Negative for fever, chills and malaise/fatigue.  Eyes: Positive for photophobia. Negative for blurred vision and double vision.  Respiratory: Negative for shortness of breath.   Cardiovascular: Negative for chest pain, palpitations and leg swelling.  Gastrointestinal: Positive for nausea. Negative for vomiting, abdominal pain, diarrhea and constipation.  Genitourinary: Negative for dysuria.       Vaginal discharge No vaginal bleeding.  Musculoskeletal: Negative for myalgias.  Neurological: Positive for dizziness and headaches. Negative for sensory change,  speech change, focal weakness and loss of consciousness.   Physical Exam   Blood pressure 107/68, pulse 86, temperature 97.7 F (36.5 C), temperature source Oral, resp. rate 16, height 5' (1.524 m), weight 68.55 kg (151 lb 2 oz), last menstrual period 08/15/2011, SpO2 100.00%.  Physical Exam  Nursing note and vitals reviewed. Constitutional: She is oriented to person, place, and time. She appears well-developed and well-nourished. No distress.  HENT:  Head: Normocephalic and atraumatic.  Mouth/Throat: Oropharynx is clear and moist.  Eyes: Conjunctivae normal and EOM are normal. Pupils are equal, round, and reactive to light.  No scleral icterus.  Neck: Normal range of motion. Neck supple. No tracheal deviation present. No thyromegaly present.  Cardiovascular: Normal rate, regular rhythm and intact distal pulses.  Exam reveals no gallop and no friction rub.   Murmur (1/6 systolic murmur; patient reports having this since childhood) heard. Respiratory: Effort normal and breath sounds normal. No respiratory distress. She has no wheezes. She has no rales.  GI: Soft. Bowel sounds are normal. She exhibits no distension and no mass. There is no tenderness. There is no rebound and no guarding.  Genitourinary:       Fundal height measures about 18-20 weeks.  Musculoskeletal: Normal range of motion. She exhibits no edema and no tenderness.  Neurological: She is alert and oriented to person, place, and time. She has normal strength. No cranial nerve deficit or sensory deficit. Coordination and gait normal.  Skin: Skin is warm and dry. No rash noted. She is not diaphoretic. No erythema.  Psychiatric: She has a normal mood and affect. Her behavior is normal. Thought content normal.    MAU Course  Procedures  Results for orders placed during the hospital encounter of 12/13/11 (from the past 24 hour(s))  GLUCOSE, CAPILLARY     Status: Normal   Collection Time   12/13/11 11:29 AM      Component Value Range   Glucose-Capillary 94  70 - 99 mg/dL    .  Results for orders placed during the hospital encounter of 12/13/11 (from the past 24 hour(s))  GLUCOSE, CAPILLARY     Status: Normal   Collection Time   12/13/11 11:29 AM      Component Value Range   Glucose-Capillary 94  70 - 99 mg/dL    Tylenol tablet 1000mg  for headache. On re-exam patient is doing much better.    Assessment and Plan  Hyperglycemia in pregnancy- Patient has hyperglycemia following surgery to remove part of her pancreas. She has had high blood sugars since becoming pregnant. We have scheduled her an appointment with the [facility] here at the [facility]  for next week. She was instructed to get a 24 hour urine catch prior to her appointment. We will start her on 2.5mg  glyburide daily.    [place] has indicated her EGA to be 15wk 6days.   Headache- Signs and symptoms indicate that this is most likely tension type headache. She has no focal neuro deficits and pain was relieved with tylenol today. Patient instructed to continue with tylenol up to 3g per day and try to reduce stress.  BV-  Patient has history of untreated BV and is still symptomatic. We will give her a new prescription for flagyl 500mg  to take BID for 7 days.   Winfield Cunas 12/13/2011, 11:17 AM   Evaluation and management procedures were performed by PA-S under my supervision/collaboration. Chart reviewed, patient examined by me and I agree with management and plan. C/W Dr.  Debroah Loop re: glyburide based on history.  Danae Orleans, CNM 12/13/2011 3:06 PM

## 2011-12-13 NOTE — MAU Note (Signed)
Pt states was here and dx'd with bv, still had abnormal vaginal discharge. Has had headache. Woke pt up out of her sleep with bad headache. 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 Has headaches every day, all day.

## 2011-12-15 ENCOUNTER — Other Ambulatory Visit: Payer: Managed Care, Other (non HMO)

## 2011-12-22 ENCOUNTER — Ambulatory Visit (INDEPENDENT_AMBULATORY_CARE_PROVIDER_SITE_OTHER): Payer: Self-pay | Admitting: Advanced Practice Midwife

## 2011-12-22 VITALS — BP 104/70 | Temp 97.4°F | Wt 152.4 lb

## 2011-12-22 DIAGNOSIS — O0930 Supervision of pregnancy with insufficient antenatal care, unspecified trimester: Secondary | ICD-10-CM

## 2011-12-22 DIAGNOSIS — A630 Anogenital (venereal) warts: Secondary | ICD-10-CM | POA: Insufficient documentation

## 2011-12-22 DIAGNOSIS — O0990 Supervision of high risk pregnancy, unspecified, unspecified trimester: Secondary | ICD-10-CM

## 2011-12-22 LAB — POCT URINALYSIS DIP (DEVICE)
Glucose, UA: 250 mg/dL — AB
Leukocytes, UA: NEGATIVE
Nitrite: NEGATIVE
Protein, ur: NEGATIVE mg/dL
Specific Gravity, Urine: >=1.03 (ref 1.005–1.030)
Urobilinogen, UA: 0.2 mg/dL (ref 0.0–1.0)

## 2011-12-22 NOTE — Progress Notes (Signed)
Nutrition Note:  1st visit Pt. Seen for DM education:  Glyburide daily.  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 Given verbal and written Dm education.  Sporadic meals and snacks - depends on how she feels.  Is checking BS.  Drinks water, 2% milk, juice.  Craves sweets.  PNV daily. No food allergies.  No N/V reported Client agrees to follow DM diet including 3 meals and 3 snacks and proper CHO/protein combination.  Discussed weight gain goals of 15-25#.  Discussed limiting sweets and juice. Currently receives [facility]. F/U in 2-4 weeks. Candice C. Earlene Plater, MPH, RD, LDN

## 2011-12-22 NOTE — Progress Notes (Signed)
   Subjective:    [name] is a G1P0 [redacted]w[redacted]d being seen today for her first obstetrical visit.  Her history is significant for pancreatic cyst and resection. Pt is not diabetic prior to pregnancy but pregnancy causing increase in blood sugar and Glyburide started from [facility].  Pt had recent diagnosis of genital warts in [facility] and is concerned about this r/t pregnancy, treatment for herself and her partner, etc.   Patient does intend to breast feed. Pregnancy history fully reviewed.  Patient reports no complaints and increased interest in sexual activity.  Filed Vitals:   12/22/11 0856  BP: 104/70  Temp: 97.4 F (36.3 C)  Weight: 69.128 kg (152 lb 6.4 oz)    HISTORY: OB History    Grav Para Term Preterm Abortions TAB SAB Ect Mult Living   1              # Outc Date GA Lbr Len/2nd Wgt Sex Del Anes PTL Lv   1 CUR              Past Medical History  Diagnosis Date  . GERD (gastroesophageal reflux disease)   . Asthma   . Pelvic inflammatory disease   . Pancreatitis   . Polycystic ovary syndrome   . Pancreatic cyst    Past Surgical History  Procedure Date  . Splenectomy, total   . Pancreas surgery    Family History  Problem Relation Age of Onset  . Diabetes Mother   . Colon cancer Neg Hx      Exam    Uterus:     Pelvic Exam:    Perineum: No Hemorrhoids   Vulva: 3 small flesh-colored lesions, less than 0.5cm in size each, otherwise  normal   Vagina:  normal mucosa, normal discharge   pH: --   Cervix: no cervical motion tenderness, no lesions and nulliparous appearance   Adnexa: normal adnexa and no mass, fullness, tenderness   Bony Pelvis: average  System: Breast:  normal appearance, no masses or tenderness   Skin: normal coloration and turgor, no rashes    Neurologic: oriented, normal, normal mood, gait normal; reflexes normal and symmetric   Extremities: normal strength, tone, and muscle mass, ROM of all joints is normal   HEENT PERRLA   Mouth/Teeth  mucous membranes moist, pharynx normal without lesions   Neck supple   Cardiovascular: regular rate and rhythm   Respiratory:  appears well, vitals normal, no respiratory distress, acyanotic, normal RR, ear and throat exam is normal, neck free of mass or lymphadenopathy, chest clear, no wheezing, crepitations, rhonchi, normal symmetric air entry   Abdomen: soft, non-tender; bowel sounds normal; no masses,  no organomegaly   Urinary: urethral meatus normal      Assessment:    Pregnancy: G1P0 Patient Active Problem List  Diagnosis  . Pancreatic pseudocyst  . Genital warts        Plan:     Initial labs ordered.  Pt left clinic before labs drawn.  Need to be drawn at next visit. Prenatal vitamins. Problem list reviewed and updated. Genetic Screening discussed genetic screening and late start to prenatal care: declined.  Ultrasound discussed; fetal survey: ordered.  Follow up in 4 weeks. 75% of 30 min visit spent on counseling and coordination of care.     LEFTWICH-KIRBY, LISA 12/22/2011

## 2011-12-22 NOTE — Progress Notes (Signed)
Pulse: 93

## 2011-12-24 LAB — CULTURE, OB URINE: Colony Count: 9000

## 2011-12-30 ENCOUNTER — Encounter: Payer: Self-pay | Admitting: *Deleted

## 2011-12-30 DIAGNOSIS — O0930 Supervision of pregnancy with insufficient antenatal care, unspecified trimester: Secondary | ICD-10-CM | POA: Insufficient documentation

## 2012-01-19 ENCOUNTER — Ambulatory Visit (INDEPENDENT_AMBULATORY_CARE_PROVIDER_SITE_OTHER): Payer: Self-pay | Admitting: Obstetrics & Gynecology

## 2012-01-19 VITALS — BP 107/69 | Temp 97.9°F | Wt 160.4 lb

## 2012-01-19 DIAGNOSIS — O0990 Supervision of high risk pregnancy, unspecified, unspecified trimester: Secondary | ICD-10-CM

## 2012-01-19 DIAGNOSIS — O0930 Supervision of pregnancy with insufficient antenatal care, unspecified trimester: Secondary | ICD-10-CM

## 2012-01-19 LAB — POCT URINALYSIS DIP (DEVICE)
Bilirubin Urine: NEGATIVE
Glucose, UA: NEGATIVE mg/dL
Hgb urine dipstick: NEGATIVE
Leukocytes, UA: NEGATIVE
Nitrite: NEGATIVE
Urobilinogen, UA: 0.2 mg/dL (ref 0.0–1.0)

## 2012-01-19 NOTE — Progress Notes (Signed)
Needs DM education with Seward Grater May next week. Testing at home but doesn't have meter today. Improved with Glyburide. 4/day testing  Encouraged. Legs bother her night cant get comfortable, may be RLS but doesn't want to use meds.

## 2012-01-19 NOTE — Progress Notes (Signed)
Pulse- 86  Pain/pressure- lower abd, back pain  Vaginal discharge- clear Pt c/o legs tingling at night concerned of "poor circulation".  Felt like she had x 1 episode of pancreatitis and pt would like her lipase levels checked today.  Pt also is having a lot of gas pains.

## 2012-01-19 NOTE — Patient Instructions (Addendum)
Gestational Diabetes Mellitus Gestational diabetes mellitus (GDM) is diabetes that occurs only during pregnancy. This happens when the body cannot properly handle the glucose (sugar) that increases in the blood after eating. During pregnancy, insulin resistance (reduced sensitivity to insulin) occurs because of the release of hormones from the placenta. Usually, the pancreas of pregnant women produces enough insulin to overcome the resistance that occurs. However, in gestational diabetes, the insulin is there but it does not work effectively. If the resistance is severe enough that the pancreas does not produce enough insulin, extra glucose builds up in the blood.  WHO IS AT RISK FOR DEVELOPING GESTATIONAL DIABETES?  Women with a history of diabetes in the family.  Women over age 25.  Women who are overweight.  Women in certain ethnic groups (Hispanic, African American, Native American, Asian and Pacific Islander). WHAT CAN HAPPEN TO THE BABY? If the mother's blood glucose is too high while she is pregnant, the extra sugar will travel through the umbilical cord to the baby. Some of the problems the baby may have are:  Large Baby - If the baby receives too much sugar, the baby will gain more weight. This may cause the baby to be too large to be born normally (vaginally) and a Cesarean section (C-section) may be needed.  Low Blood Glucose (hypoglycemia)  The baby makes extra insulin, in response to the extra sugar its gets from its mother. When the baby is born and no longer needs this extra insulin, the baby's blood glucose level may drop.  Jaundice (yellow coloring of the skin and eyes)  This is fairly common in babies. It is caused from a build-up of the chemical called bilirubin. This is rarely serious, but is seen more often in babies whose mothers had gestational diabetes. RISKS TO THE MOTHER Women who have had gestational diabetes may be at higher risk for some problems,  including:  Preeclampsia or toxemia, which includes problems with high blood pressure. Blood pressure and protein levels in the urine must be checked frequently.  Infections.  Cesarean section (C-section) for delivery.  Developing Type 2 diabetes later in life. About 30-50% will develop diabetes later, especially if obese. DIAGNOSIS  The hormones that cause insulin resistance are highest at about 24-28 weeks of pregnancy. If symptoms are experienced, they are much like symptoms you would normally expect during pregnancy.  GDM is often diagnosed using a two part method: 1. After 24-28 weeks of pregnancy, the woman drinks a glucose solution and takes a blood test. If the glucose level is high, a second test will be given. 2. Oral Glucose Tolerance Test (OGTT) which is 3 hours long  After not eating overnight, the blood glucose is checked. The woman drinks a glucose solution, and hourly blood glucose tests are taken. If the woman has risk factors for GDM, the caregiver may test earlier than 24 weeks of pregnancy. TREATMENT  Treatment of GDM is directed at keeping the mother's blood glucose level normal, and may include:  Meal planning.  Taking insulin or other medicine to control your blood glucose level.  Exercise.  Keeping a daily record of the foods you eat.  Blood glucose monitoring and keeping a record of your blood glucose levels.  May monitor ketone levels in the urine, although this is no longer considered necessary in most pregnancies. HOME CARE INSTRUCTIONS  While you are pregnant:  Follow your caregiver's advice regarding your prenatal appointments, meal planning, exercise, medicines, vitamins, blood and other tests, and physical   activities.  Keep a record of your meals, blood glucose tests, and the amount of insulin you are taking (if any). Show this to your caregiver at every prenatal visit.  If you have GDM, you may have problems with hypoglycemia (low blood glucose).  You may suspect this if you become suddenly dizzy, feel shaky, and/or weak. If you think this is happening and you have a glucose meter, try to test your blood glucose level. Follow your caregiver's advice for when and how to treat your low blood glucose. Generally, the 15:15 rule is followed: Treat by consuming 15 grams of carbohydrates, wait 15 minutes, and recheck blood glucose. Examples of 15 grams of carbohydrates are:  1 cup skim or low-fat milk.   cup juice.  3-4 glucose tablets.  5-6 hard candies.  1 small box raisins.   cup regular soda pop.  Practice good hygiene, to avoid infections.  Do not smoke. SEEK MEDICAL CARE IF:   You develop abnormal vaginal discharge, with or without itching.  You become weak and tired more than expected.  You seem to sweat a lot.  You have a sudden increase in weight, 5 pounds or more in one week.  You are losing weight, 3 pounds or more in a week.  Your blood glucose level is high, and you need instructions on what to do about it. SEEK IMMEDIATE MEDICAL CARE IF:   You develop a severe headache.  You faint or pass out.  You develop nausea and vomiting.  You become disoriented or confused.  You have a convulsion.  You develop vision problems.  You develop stomach pain.  You develop vaginal bleeding.  You develop uterine contractions.  You have leaking or a gush of fluid from the vagina. AFTER YOU HAVE THE BABY:  Go to all of your follow-up appointments, and have blood tests as advised by your caregiver.  Maintain a healthy lifestyle, to prevent diabetes in the future. This includes:  Following a healthy meal plan.  Controlling your weight.  Getting enough exercise and proper rest.  Do not smoke.  Breastfeed your baby if you can. This will lower the chance of you and your baby developing diabetes later in life. For more information about diabetes, go to the American Diabetes Association at:  www.americandiabetesassociation.org. For more information about gestational diabetes, go to the American Congress of Obstetricians and Gynecologists at: www.acog.org. Document Released: 05/30/2000 Document Revised: 05/16/2011 Document Reviewed: 12/22/2008 [employer] Patient Information 2013 [employer].  

## 2012-01-23 ENCOUNTER — Ambulatory Visit (INDEPENDENT_AMBULATORY_CARE_PROVIDER_SITE_OTHER): Payer: Self-pay | Admitting: Family Medicine

## 2012-01-23 ENCOUNTER — Encounter: Payer: Medicaid Other | Attending: Obstetrics & Gynecology | Admitting: Dietician

## 2012-01-23 VITALS — BP 110/70 | Temp 97.9°F | Wt 158.7 lb

## 2012-01-23 DIAGNOSIS — Z8719 Personal history of other diseases of the digestive system: Secondary | ICD-10-CM

## 2012-01-23 DIAGNOSIS — Z713 Dietary counseling and surveillance: Secondary | ICD-10-CM | POA: Insufficient documentation

## 2012-01-23 DIAGNOSIS — O0930 Supervision of pregnancy with insufficient antenatal care, unspecified trimester: Secondary | ICD-10-CM

## 2012-01-23 DIAGNOSIS — O99810 Abnormal glucose complicating pregnancy: Secondary | ICD-10-CM | POA: Insufficient documentation

## 2012-01-23 DIAGNOSIS — O24919 Unspecified diabetes mellitus in pregnancy, unspecified trimester: Secondary | ICD-10-CM

## 2012-01-23 LAB — COMPREHENSIVE METABOLIC PANEL
Albumin: 3.7 g/dL (ref 3.5–5.2)
BUN: 5 mg/dL — ABNORMAL LOW (ref 6–23)
Calcium: 9.2 mg/dL (ref 8.4–10.5)
Chloride: 105 mEq/L (ref 96–112)
Creat: 0.45 mg/dL — ABNORMAL LOW (ref 0.50–1.10)
Glucose, Bld: 87 mg/dL (ref 70–99)
Potassium: 4.2 mEq/L (ref 3.5–5.3)

## 2012-01-23 LAB — POCT URINALYSIS DIP (DEVICE)
Bilirubin Urine: NEGATIVE
Glucose, UA: NEGATIVE mg/dL
Hgb urine dipstick: NEGATIVE
Ketones, ur: NEGATIVE mg/dL
Nitrite: NEGATIVE
Protein, ur: NEGATIVE mg/dL
Specific Gravity, Urine: 1.02 (ref 1.005–1.030)
Urobilinogen, UA: 0.2 mg/dL (ref 0.0–1.0)
pH: 7 (ref 5.0–8.0)

## 2012-01-23 NOTE — Progress Notes (Signed)
Not checking sugars as directed, has missed due to running out of strips, dropping machine, staying at FOB's house and forgetting meter. Did not bring CBG log. States her fasting BS are all around 95 and her PP are around 110, highest 136 after eating cake.  On glyburide 2.5 mg qhs. To diabetes educator today. Occasional contraction last < 1 minute, 3 x last week. No bleeding, no LOF. Refer for fetal echo, has medicaid card now. F/U ultrasound for fetal anatomy not seen in prior sono.  IOB panel today. Had epigastric pain radiating to back with nausea last night for one hour, worried about another attack of pancreatitis. Has not seen her doctor since June due to not having medicaid.  Will check CMP today.  Encouraged pt to contact Dr. Marilynn Rail. Call or go to [facility] if continued pain. Consider abdominal ultrasound.

## 2012-01-23 NOTE — Progress Notes (Signed)
Diabetes Education:  Comes with a history of having had a pancreatic cyst with surgery which removed a portion of her pancreas.  Following surgery, she was on enzymes for a short period, but does not need them at this time.  She has never needed to use a glucose lowering agent or insulin prior to her pregnancy. Prior to her surgery, she reports losing 80 lb.  She received DM education following her surgery at [facility].  Today we reviewed the physiology of pregnancy and how the placental hormones affect her blood glucose levels.  From her comments, she has not been following the GDM recommended diet real closely.  Today, she comes with her significant other and he is attentive to the dietary information that we reviewed and notes he will help her to adhere to not using regular juice and monitoring her portions.  We reviewed the S/S for hypoglycemia.  She has an Accu-Chek meter and a prescription for strips and lancets.  Reminded her to bring her meter and glucose log to all clinic appointments.  Maggie Tierney Behl, RN, RD, CDE

## 2012-01-23 NOTE — Patient Instructions (Signed)
Pregnancy - Second Trimester °The second trimester of pregnancy (3 to 6 months) is a period of rapid growth for you and your baby. At the end of the sixth month, your baby is about 9 inches long and weighs 1 1/2 pounds. You will begin to feel the baby move between 18 and 20 weeks of the pregnancy. This is called quickening. Weight gain is faster. A clear fluid (colostrum) may leak out of your breasts. You may feel small contractions of the womb (uterus). This is known as false labor or Braxton-Hicks contractions. This is like a practice for labor when the baby is ready to be born. Usually, the problems with morning sickness have usually passed by the end of your first trimester. Some women develop small dark blotches (called cholasma, mask of pregnancy) on their face that usually goes away after the baby is born. Exposure to the sun makes the blotches worse. Acne may also develop in some pregnant women and pregnant women who have acne, may find that it goes away. °PRENATAL EXAMS °· Blood work may continue to be done during prenatal exams. These tests are done to check on your health and the probable health of your baby. Blood work is used to follow your blood levels (hemoglobin). Anemia (low hemoglobin) is common during pregnancy. Iron and vitamins are given to help prevent this. You will also be checked for diabetes between 24 and 28 weeks of the pregnancy. Some of the previous blood tests may be repeated. °· The size of the uterus is measured during each visit. This is to make sure that the baby is continuing to grow properly according to the dates of the pregnancy. °· Your blood pressure is checked every prenatal visit. This is to make sure you are not getting toxemia. °· Your urine is checked to make sure you do not have an infection, diabetes or protein in the urine. °· Your weight is checked often to make sure gains are happening at the suggested rate. This is to ensure that both you and your baby are growing  normally. °· Sometimes, an ultrasound is performed to confirm the proper growth and development of the baby. This is a test which bounces harmless sound waves off the baby so your caregiver can more accurately determine due dates. °Sometimes, a specialized test is done on the amniotic fluid surrounding the baby. This test is called an amniocentesis. The amniotic fluid is obtained by sticking a needle into the belly (abdomen). This is done to check the chromosomes in instances where there is a concern about possible genetic problems with the baby. It is also sometimes done near the end of pregnancy if an early delivery is required. In this case, it is done to help make sure the baby's lungs are mature enough for the baby to live outside of the womb. °CHANGES OCCURING IN THE SECOND TRIMESTER OF PREGNANCY °Your body goes through many changes during pregnancy. They vary from person to person. Talk to your caregiver about changes you notice that you are concerned about. °· During the second trimester, you will likely have an increase in your appetite. It is normal to have cravings for certain foods. This varies from person to person and pregnancy to pregnancy. °· Your lower abdomen will begin to bulge. °· You may have to urinate more often because the uterus and baby are pressing on your bladder. It is also common to get more bladder infections during pregnancy (pain with urination). You can help this by   drinking lots of fluids and emptying your bladder before and after intercourse. °· You may begin to get stretch marks on your hips, abdomen, and breasts. These are normal changes in the body during pregnancy. There are no exercises or medications to take that prevent this change. °· You may begin to develop swollen and bulging veins (varicose veins) in your legs. Wearing support hose, elevating your feet for 15 minutes, 3 to 4 times a day and limiting salt in your diet helps lessen the problem. °· Heartburn may develop  as the uterus grows and pushes up against the stomach. Antacids recommended by your caregiver helps with this problem. Also, eating smaller meals 4 to 5 times a day helps. °· Constipation can be treated with a stool softener or adding bulk to your diet. Drinking lots of fluids, vegetables, fruits, and whole grains are helpful. °· Exercising is also helpful. If you have been very active up until your pregnancy, most of these activities can be continued during your pregnancy. If you have been less active, it is helpful to start an exercise program such as walking. °· Hemorrhoids (varicose veins in the rectum) may develop at the end of the second trimester. Warm sitz baths and hemorrhoid cream recommended by your caregiver helps hemorrhoid problems. °· Backaches may develop during this time of your pregnancy. Avoid heavy lifting, wear low heal shoes and practice good posture to help with backache problems. °· Some pregnant women develop tingling and numbness of their hand and fingers because of swelling and tightening of ligaments in the wrist (carpel tunnel syndrome). This goes away after the baby is born. °· As your breasts enlarge, you may have to get a bigger bra. Get a comfortable, cotton, support bra. Do not get a nursing bra until the last month of the pregnancy if you will be nursing the baby. °· You may get a dark line from your belly button to the pubic area called the linea nigra. °· You may develop rosy cheeks because of increase blood flow to the face. °· You may develop spider looking lines of the face, neck, arms and chest. These go away after the baby is born. °HOME CARE INSTRUCTIONS  °· It is extremely important to avoid all smoking, herbs, alcohol, and unprescribed drugs during your pregnancy. These chemicals affect the formation and growth of the baby. Avoid these chemicals throughout the pregnancy to ensure the delivery of a healthy infant. °· Most of your home care instructions are the same as  suggested for the first trimester of your pregnancy. Keep your caregiver's appointments. Follow your caregiver's instructions regarding medication use, exercise and diet. °· During pregnancy, you are providing food for you and your baby. Continue to eat regular, well-balanced meals. Choose foods such as meat, fish, milk and other low fat dairy products, vegetables, fruits, and whole-grain breads and cereals. Your caregiver will tell you of the ideal weight gain. °· A physical sexual relationship may be continued up until near the end of pregnancy if there are no other problems. Problems could include early (premature) leaking of amniotic fluid from the membranes, vaginal bleeding, abdominal pain, or other medical or pregnancy problems. °· Exercise regularly if there are no restrictions. Check with your caregiver if you are unsure of the safety of some of your exercises. The greatest weight gain will occur in the last 2 trimesters of pregnancy. Exercise will help you: °· Control your weight. °· Get you in shape for labor and delivery. °· Lose weight   after you have the baby. °· Wear a good support or jogging bra for breast tenderness during pregnancy. This may help if worn during sleep. Pads or tissues may be used in the bra if you are leaking colostrum. °· Do not use hot tubs, steam rooms or saunas throughout the pregnancy. °· Wear your seat belt at all times when driving. This protects you and your baby if you are in an accident. °· Avoid raw meat, uncooked cheese, cat litter boxes and soil used by cats. These carry germs that can cause birth defects in the baby. °· The second trimester is also a good time to visit your dentist for your dental health if this has not been done yet. Getting your teeth cleaned is OK. Use a soft toothbrush. Brush gently during pregnancy. °· It is easier to loose urine during pregnancy. Tightening up and strengthening the pelvic muscles will help with this problem. Practice stopping your  urination while you are going to the bathroom. These are the same muscles you need to strengthen. It is also the muscles you would use as if you were trying to stop from passing gas. You can practice tightening these muscles up 10 times a set and repeating this about 3 times per day. Once you know what muscles to tighten up, do not perform these exercises during urination. It is more likely to contribute to an infection by backing up the urine. °· Ask for help if you have financial, counseling or nutritional needs during pregnancy. Your caregiver will be able to offer counseling for these needs as well as refer you for other special needs. °· Your skin may become oily. If so, wash your face with mild soap, use non-greasy moisturizer and oil or cream based makeup. °MEDICATIONS AND DRUG USE IN PREGNANCY °· Take prenatal vitamins as directed. The vitamin should contain 1 milligram of folic acid. Keep all vitamins out of reach of children. Only a couple vitamins or tablets containing iron may be fatal to a baby or young child when ingested. °· Avoid use of all medications, including herbs, over-the-counter medications, not prescribed or suggested by your caregiver. Only take over-the-counter or prescription medicines for pain, discomfort, or fever as directed by your caregiver. Do not use aspirin. °· Let your caregiver also know about herbs you may be using. °· Alcohol is related to a number of birth defects. This includes fetal alcohol syndrome. All alcohol, in any form, should be avoided completely. Smoking will cause low birth rate and premature babies. °· Street or illegal drugs are very harmful to the baby. They are absolutely forbidden. A baby born to an addicted mother will be addicted at birth. The baby will go through the same withdrawal an adult does. °SEEK MEDICAL CARE IF:  °You have any concerns or worries during your pregnancy. It is better to call with your questions if you feel they cannot wait, rather  than worry about them. °SEEK IMMEDIATE MEDICAL CARE IF:  °· An unexplained oral temperature above 102° F (38.9° C) develops, or as your caregiver suggests. °· You have leaking of fluid from the vagina (birth canal). If leaking membranes are suspected, take your temperature and tell your caregiver of this when you call. °· There is vaginal spotting, bleeding, or passing clots. Tell your caregiver of the amount and how many pads are used. Light spotting in pregnancy is common, especially following intercourse. °· You develop a bad smelling vaginal discharge with a change in the color from clear   to white. °· You continue to feel sick to your stomach (nauseated) and have no relief from remedies suggested. You vomit blood or coffee ground-like materials. °· You lose more than 2 pounds of weight or gain more than 2 pounds of weight over 1 week, or as suggested by your caregiver. °· You notice swelling of your face, hands, feet, or legs. °· You get exposed to German measles and have never had them. °· You are exposed to fifth disease or chickenpox. °· You develop belly (abdominal) pain. Round ligament discomfort is a common non-cancerous (benign) cause of abdominal pain in pregnancy. Your caregiver still must evaluate you. °· You develop a bad headache that does not go away. °· You develop fever, diarrhea, pain with urination, or shortness of breath. °· You develop visual problems, blurry, or double vision. °· You fall or are in a car accident or any kind of trauma. °· There is mental or physical violence at home. °Document Released: 02/15/2001 Document Revised: 05/16/2011 Document Reviewed: 08/20/2008 °ExitCare® Patient Information ©2013 [employer]. ° °Gestational Diabetes Mellitus °Gestational diabetes mellitus (GDM) is diabetes that occurs only during pregnancy. This happens when the body cannot properly handle the glucose (sugar) that increases in the blood after eating. During pregnancy, insulin resistance (reduced  sensitivity to insulin) occurs because of the release of hormones from the placenta. Usually, the pancreas of pregnant women produces enough insulin to overcome the resistance that occurs. However, in gestational diabetes, the insulin is there but it does not work effectively. If the resistance is severe enough that the pancreas does not produce enough insulin, extra glucose builds up in the blood.  °WHO IS AT RISK FOR DEVELOPING GESTATIONAL DIABETES? °· Women with a history of diabetes in the family. °· Women over age 25. °· Women who are overweight. °· Women in certain ethnic groups (Hispanic, African American, Native American, Asian and Pacific Islander). °WHAT CAN HAPPEN TO THE BABY? °If the mother's blood glucose is too high while she is pregnant, the extra sugar will travel through the umbilical cord to the baby. Some of the problems the baby may have are: °· Large Baby - If the baby receives too much sugar, the baby will gain more weight. This may cause the baby to be too large to be born normally (vaginally) and a Cesarean section (C-section) may be needed. °· Low Blood Glucose (hypoglycemia)  The baby makes extra insulin, in response to the extra sugar its gets from its mother. When the baby is born and no longer needs this extra insulin, the baby's blood glucose level may drop. °· Jaundice (yellow coloring of the skin and eyes)  This is fairly common in babies. It is caused from a build-up of the chemical called bilirubin. This is rarely serious, but is seen more often in babies whose mothers had gestational diabetes. °RISKS TO THE MOTHER °Women who have had gestational diabetes may be at higher risk for some problems, including: °· Preeclampsia or toxemia, which includes problems with high blood pressure. Blood pressure and protein levels in the urine must be checked frequently. °· Infections. °· Cesarean section (C-section) for delivery. °· Developing Type 2 diabetes later in life. About 30-50% will  develop diabetes later, especially if obese. °DIAGNOSIS  °The hormones that cause insulin resistance are highest at about 24-28 weeks of pregnancy. If symptoms are experienced, they are much like symptoms you would normally expect during pregnancy.  °GDM is often diagnosed using a two part method: °1. After 24-28   weeks of pregnancy, the woman drinks a glucose solution and takes a blood test. If the glucose level is high, a second test will be given. °2. Oral Glucose Tolerance Test (OGTT) which is 3 hours long  After not eating overnight, the blood glucose is checked. The woman drinks a glucose solution, and hourly blood glucose tests are taken. °If the woman has risk factors for GDM, the caregiver may test earlier than 24 weeks of pregnancy. °TREATMENT  °Treatment of GDM is directed at keeping the mother's blood glucose level normal, and may include: °· Meal planning. °· Taking insulin or other medicine to control your blood glucose level. °· Exercise. °· Keeping a daily record of the foods you eat. °· Blood glucose monitoring and keeping a record of your blood glucose levels. °· May monitor ketone levels in the urine, although this is no longer considered necessary in most pregnancies. °HOME CARE INSTRUCTIONS  °While you are pregnant: °· Follow your caregiver's advice regarding your prenatal appointments, meal planning, exercise, medicines, vitamins, blood and other tests, and physical activities. °· Keep a record of your meals, blood glucose tests, and the amount of insulin you are taking (if any). Show this to your caregiver at every prenatal visit. °· If you have GDM, you may have problems with hypoglycemia (low blood glucose). You may suspect this if you become suddenly dizzy, feel shaky, and/or weak. If you think this is happening and you have a glucose meter, try to test your blood glucose level. Follow your caregiver's advice for when and how to treat your low blood glucose. Generally, the 15:15 rule is  followed: Treat by consuming 15 grams of carbohydrates, wait 15 minutes, and recheck blood glucose. Examples of 15 grams of carbohydrates are: °· 1 cup skim or low-fat milk. °· ½ cup juice. °· 3-4 glucose tablets. °· 5-6 hard candies. °· 1 small box raisins. °· ½ cup regular soda pop. °· Practice good hygiene, to avoid infections. °· Do not smoke. °SEEK MEDICAL CARE IF:  °· You develop abnormal vaginal discharge, with or without itching. °· You become weak and tired more than expected. °· You seem to sweat a lot. °· You have a sudden increase in weight, 5 pounds or more in one week. °· You are losing weight, 3 pounds or more in a week. °· Your blood glucose level is high, and you need instructions on what to do about it. °SEEK IMMEDIATE MEDICAL CARE IF:  °· You develop a severe headache. °· You faint or pass out. °· You develop nausea and vomiting. °· You become disoriented or confused. °· You have a convulsion. °· You develop vision problems. °· You develop stomach pain. °· You develop vaginal bleeding. °· You develop uterine contractions. °· You have leaking or a gush of fluid from the vagina. °AFTER YOU HAVE THE BABY: °· Go to all of your follow-up appointments, and have blood tests as advised by your caregiver. °· Maintain a healthy lifestyle, to prevent diabetes in the future. This includes: °· Following a healthy meal plan. °· Controlling your weight. °· Getting enough exercise and proper rest. °· Do not smoke. °· Breastfeed your baby if you can. This will lower the chance of you and your baby developing diabetes later in life. °For more information about diabetes, go to the American Diabetes Association at: www.americandiabetesassociation.org. °For more information about gestational diabetes, go to the American Congress of Obstetricians and Gynecologists at: www.acog.org. °Document Released: 05/30/2000 Document Revised: 05/16/2011 Document Reviewed: 12/22/2008 °  ExitCare® Patient Information ©2013 [employer]. ° °

## 2012-01-23 NOTE — Progress Notes (Signed)
Pulse 89 Patient reports pelvic pressure and occasional non-painful contractions. Patient voices concern about feeling baby move enough and about contractions.

## 2012-01-24 LAB — OBSTETRIC PANEL
Antibody Screen: NEGATIVE
Basophils Absolute: 0 10*3/uL (ref 0.0–0.1)
Basophils Relative: 0 % (ref 0–1)
HCT: 32.7 % — ABNORMAL LOW (ref 36.0–46.0)
Hemoglobin: 11.3 g/dL — ABNORMAL LOW (ref 12.0–15.0)
Hepatitis B Surface Ag: NEGATIVE
Lymphocytes Relative: 23 % (ref 12–46)
MCHC: 34.6 g/dL (ref 30.0–36.0)
Monocytes Absolute: 0.9 10*3/uL (ref 0.1–1.0)
Monocytes Relative: 10 % (ref 3–12)
Neutro Abs: 5.9 10*3/uL (ref 1.7–7.7)
Neutrophils Relative %: 66 % (ref 43–77)
Rh Type: POSITIVE
Rubella: 111.1 IU/mL — ABNORMAL HIGH
WBC: 8.9 10*3/uL (ref 4.0–10.5)

## 2012-02-03 ENCOUNTER — Ambulatory Visit (HOSPITAL_COMMUNITY)
Admission: RE | Admit: 2012-02-03 | Discharge: 2012-02-03 | Disposition: A | Discharge status: Home / Self Care | Payer: Medicaid Other | Source: Ambulatory Visit | Attending: Family Medicine | Admitting: Family Medicine

## 2012-02-03 DIAGNOSIS — Z3689 Encounter for other specified antenatal screening: Secondary | ICD-10-CM | POA: Insufficient documentation

## 2012-02-03 DIAGNOSIS — O24919 Unspecified diabetes mellitus in pregnancy, unspecified trimester: Secondary | ICD-10-CM | POA: Insufficient documentation

## 2012-02-06 ENCOUNTER — Encounter: Payer: Self-pay | Admitting: Family Medicine

## 2012-03-12 ENCOUNTER — Inpatient Hospital Stay (HOSPITAL_COMMUNITY)
Admission: AD | Admit: 2012-03-12 | Discharge: 2012-03-13 | Disposition: A | Discharge status: Home / Self Care | Payer: Medicaid Other | Source: Ambulatory Visit | Attending: Obstetrics & Gynecology | Admitting: Obstetrics & Gynecology

## 2012-03-12 ENCOUNTER — Encounter (HOSPITAL_COMMUNITY): Payer: Self-pay | Admitting: *Deleted

## 2012-03-12 DIAGNOSIS — R109 Unspecified abdominal pain: Secondary | ICD-10-CM | POA: Insufficient documentation

## 2012-03-12 DIAGNOSIS — K219 Gastro-esophageal reflux disease without esophagitis: Secondary | ICD-10-CM

## 2012-03-12 DIAGNOSIS — O368190 Decreased fetal movements, unspecified trimester, not applicable or unspecified: Secondary | ICD-10-CM | POA: Insufficient documentation

## 2012-03-12 DIAGNOSIS — O0930 Supervision of pregnancy with insufficient antenatal care, unspecified trimester: Secondary | ICD-10-CM

## 2012-03-12 LAB — URINALYSIS, ROUTINE W REFLEX MICROSCOPIC
Leukocytes, UA: NEGATIVE
Nitrite: NEGATIVE
Specific Gravity, Urine: 1.025 (ref 1.005–1.030)
Urobilinogen, UA: 0.2 mg/dL (ref 0.0–1.0)
pH: 6 (ref 5.0–8.0)

## 2012-03-12 MED ORDER — GI COCKTAIL ~~LOC~~
30.0000 mL | ORAL | Status: AC
  Administered 2012-03-13: 30 mL via ORAL
  Filled 2012-03-12: qty 30

## 2012-03-12 NOTE — MAU Note (Signed)
Pt G1 at 30wks having upper abd pain x 1 hr, hx pancreatitis.  "Feels like pancreatitis."  No fetal movement x 2 hrs.

## 2012-03-12 NOTE — MAU Note (Signed)
PT SAYS SHE HAS UPPER ABD PAIN-  STARTED TONIGHT AT 10PM-  SHARP-  RADIATES TO BACK.  SHE HAD   1/2 PANCREAS AND ALL  SPLEEN REMOVED 09-24-2010-  SHE HAD CYST  AND  19 ENLARGED LYMPH NODES - NONE  CANCER.     NO NAUSEA-  SAYS PAIN FEELS LIKE  PANCREATITIS.   SHE HAD  AN ATTACK ON Thursday- SHE HAD A LOT OF PAIN-  THEN IT WENT  AWAY-- TOOK NO MED.       IT DOES NOT FEEL NOW LIKE  IT DID AT 10PM.

## 2012-03-12 NOTE — MAU Note (Signed)
NEXT APPOINTMENT FOR DR Tamela Oddi- IS Wednesday AT 1 PM

## 2012-03-13 DIAGNOSIS — O368190 Decreased fetal movements, unspecified trimester, not applicable or unspecified: Secondary | ICD-10-CM

## 2012-03-13 DIAGNOSIS — R1084 Generalized abdominal pain: Secondary | ICD-10-CM

## 2012-03-13 LAB — COMPREHENSIVE METABOLIC PANEL
ALT: 11 U/L (ref 0–35)
AST: 15 U/L (ref 0–37)
Albumin: 2.9 g/dL — ABNORMAL LOW (ref 3.5–5.2)
Alkaline Phosphatase: 118 U/L — ABNORMAL HIGH (ref 39–117)
Chloride: 100 mEq/L (ref 96–112)
Potassium: 3.5 mEq/L (ref 3.5–5.1)
Sodium: 133 mEq/L — ABNORMAL LOW (ref 135–145)
Total Protein: 6.6 g/dL (ref 6.0–8.3)

## 2012-03-13 LAB — AMYLASE: Amylase: 50 U/L (ref 0–105)

## 2012-03-13 NOTE — MAU Provider Note (Signed)
History     CSN: [id number]  Arrival date and time: 03/12/12 2300   None     Chief Complaint  Patient presents with  . Abdominal Pain  . Decreased Fetal Movement   HPI This is a 27 y.o. female at [redacted]w[redacted]d who presents with c/o RUQ and epigastric abdominal pain that radiates to back. No nausea or vomiting. Did not try anything for pain Did not call her MD. Has appt Wedneday. Denies fever.   RN NOTE: PT SAYS SHE HAS UPPER ABD PAIN- STARTED TONIGHT AT 10PM- SHARP- RADIATES TO BACK. SHE HAD 1/2 PANCREAS AND ALL SPLEEN REMOVED 09-24-2010- SHE HAD CYST AND 19 ENLARGED LYMPH NODES - NONE CANCER. NO NAUSEA- SAYS PAIN FEELS LIKE PANCREATITIS. SHE HAD AN ATTACK ON Thursday- SHE HAD A LOT OF PAIN- THEN IT WENT AWAY-- TOOK NO MED. IT DOES NOT FEEL NOW LIKE IT DID AT 10PM.      OB History    Grav Para Term Preterm Abortions TAB SAB Ect Mult Living   4    3 1 2          Past Medical History  Diagnosis Date  . GERD (gastroesophageal reflux disease)   . Asthma   . Pelvic inflammatory disease   . Pancreatitis   . Polycystic ovary syndrome   . Pancreatic cyst     Past Surgical History  Procedure Date  . Splenectomy, total   . Pancreas surgery     Family History  Problem Relation Age of Onset  . Diabetes Mother   . Colon cancer Neg Hx     History  Substance Use Topics  . Smoking status: Current Every Day Smoker -- 0.2 packs/day    Types: Cigarettes  . Smokeless tobacco: Never Used  . Alcohol Use: No    Allergies:  Allergies  Allergen Reactions  . Dilaudid (Hydromorphone Hcl) Itching and Other (See Comments)    "Felt like she was losing her mind"    Prescriptions prior to admission  Medication Sig Dispense Refill  . acetaminophen (TYLENOL) 325 MG tablet Take 650 mg by mouth every 6 (six) hours as needed. For pain, headache      . glyBURIDE (DIABETA) 2.5 MG tablet Take 1 tablet (2.5 mg total) by mouth at bedtime.  30 tablet  0  . prenatal vitamin w/FE, FA (PRENATAL 1 + 1)  27-1 MG TABS Take 1 tablet by mouth daily.  30 each  0  . simethicone (MYLICON) 125 MG chewable tablet Chew 125 mg by mouth every 6 (six) hours as needed.      Marland Kitchen albuterol (PROAIR HFA) 108 (90 BASE) MCG/ACT inhaler Inhale 2 puffs into the lungs every 6 (six) hours as needed. Rescue inhaler        Review of Systems  Constitutional: Negative for fever, chills, malaise/fatigue and diaphoresis.  Gastrointestinal: Positive for heartburn and abdominal pain (mild epigastric). Negative for nausea, vomiting, diarrhea and constipation.  Genitourinary: Negative for dysuria.  Musculoskeletal: Negative for myalgias.  Neurological: Negative for weakness.   Physical Exam   Blood pressure 107/67, pulse 103, temperature 97.9 F (36.6 C), temperature source Oral, resp. rate 20, height 5' (1.524 m), weight 169 lb 6.4 oz (76.839 kg), last menstrual period 08/15/2011.  Physical Exam  Constitutional: She is oriented to person, place, and time. She appears well-developed and well-nourished. No distress.  HENT:  Head: Normocephalic.  Cardiovascular: Normal rate.   Respiratory: Effort normal.  GI: Soft. She exhibits no distension and no mass. There  is tenderness (slight over epigastrum). There is no rebound and no guarding.  Musculoskeletal: Normal range of motion.  Neurological: She is alert and oriented to person, place, and time.  Skin: Skin is warm and dry. She is not diaphoretic.  Psychiatric: She has a normal mood and affect.   FHR reassuring No contractions Results for orders placed during the hospital encounter of 03/12/12 (from the past 24 hour(s))  URINALYSIS, ROUTINE W REFLEX MICROSCOPIC     Status: Abnormal   Collection Time   03/12/12 11:14 PM      Component Value Range   Color, Urine YELLOW  YELLOW   APPearance CLEAR  CLEAR   Specific Gravity, Urine 1.025  1.005 - 1.030   pH 6.0  5.0 - 8.0   Glucose, UA NEGATIVE  NEGATIVE mg/dL   Hgb urine dipstick NEGATIVE  NEGATIVE   Bilirubin Urine  NEGATIVE  NEGATIVE   Ketones, ur >80 (*) NEGATIVE mg/dL   Protein, ur NEGATIVE  NEGATIVE mg/dL   Urobilinogen, UA 0.2  0.0 - 1.0 mg/dL   Nitrite NEGATIVE  NEGATIVE   Leukocytes, UA NEGATIVE  NEGATIVE  COMPREHENSIVE METABOLIC PANEL     Status: Abnormal   Collection Time   03/12/12 11:45 PM      Component Value Range   Sodium 133 (*) 135 - 145 mEq/L   Potassium 3.5  3.5 - 5.1 mEq/L   Chloride 100  96 - 112 mEq/L   CO2 20  19 - 32 mEq/L   Glucose, Bld 121 (*) 70 - 99 mg/dL   BUN 5 (*) 6 - 23 mg/dL   Creatinine, Ser 7.82 (*) 0.50 - 1.10 mg/dL   Calcium 9.2  8.4 - 95.6 mg/dL   Total Protein 6.6  6.0 - 8.3 g/dL   Albumin 2.9 (*) 3.5 - 5.2 g/dL   AST 15  0 - 37 U/L   ALT 11  0 - 35 U/L   Alkaline Phosphatase 118 (*) 39 - 117 U/L   Total Bilirubin 0.1 (*) 0.3 - 1.2 mg/dL   GFR calc non Af Amer >90  >90 mL/min   GFR calc Af Amer >90  >90 mL/min  AMYLASE     Status: Normal   Collection Time   03/12/12 11:45 PM      Component Value Range   Amylase 50  0 - 105 U/L  LIPASE, BLOOD     Status: Normal   Collection Time   03/12/12 11:45 PM      Component Value Range   Lipase 18  11 - 59 U/L    MAU Course  Procedures  MDM Given GI cocktail with almost total relief from pain. Labs all normal Doubt pancreatitis. Probably mostly GERD.  Assessment and Plan  A:  [facility] at [redacted]w[redacted]d       Upper abdominal pain, likely GERD      History of pancreatitis, normal labs now  P:  Discharge        Suggest restarting Protonix she has at home       Keep appt Wednesday with Dr Tamela Oddi  [facility] 03/13/2012, 12:12 AM

## 2012-05-03 ENCOUNTER — Other Ambulatory Visit (HOSPITAL_COMMUNITY): Payer: Self-pay | Admitting: Obstetrics & Gynecology

## 2012-05-04 LAB — OB RESULTS CONSOLE GBS: GBS: NEGATIVE

## 2012-05-15 ENCOUNTER — Ambulatory Visit (HOSPITAL_COMMUNITY)
Admission: RE | Admit: 2012-05-15 | Discharge: 2012-05-15 | Disposition: A | Discharge status: Home / Self Care | Payer: Medicaid Other | Source: Ambulatory Visit | Attending: Obstetrics & Gynecology | Admitting: Obstetrics & Gynecology

## 2012-05-15 DIAGNOSIS — O24919 Unspecified diabetes mellitus in pregnancy, unspecified trimester: Secondary | ICD-10-CM | POA: Insufficient documentation

## 2012-05-17 LAB — FETAL NONSTRESS TEST

## 2012-05-18 ENCOUNTER — Encounter (HOSPITAL_COMMUNITY): Payer: Self-pay | Admitting: Pharmacist

## 2012-05-18 ENCOUNTER — Telehealth: Payer: Self-pay | Admitting: Obstetrics & Gynecology

## 2012-05-21 ENCOUNTER — Encounter (HOSPITAL_COMMUNITY): Payer: Self-pay | Admitting: *Deleted

## 2012-05-21 ENCOUNTER — Encounter (HOSPITAL_COMMUNITY)
Admission: AD | Disposition: A | Discharge status: Home / Self Care | Payer: Self-pay | Source: Ambulatory Visit | Attending: Obstetrics

## 2012-05-21 ENCOUNTER — Inpatient Hospital Stay (HOSPITAL_COMMUNITY): Payer: Medicaid Other | Admitting: Anesthesiology

## 2012-05-21 ENCOUNTER — Encounter (HOSPITAL_COMMUNITY): Payer: Self-pay | Admitting: Anesthesiology

## 2012-05-21 ENCOUNTER — Inpatient Hospital Stay (HOSPITAL_COMMUNITY)
Admission: AD | Admit: 2012-05-21 | Discharge: 2012-05-21 | Disposition: A | Discharge status: Home / Self Care | Payer: Medicaid Other | Source: Ambulatory Visit | Attending: Obstetrics | Admitting: Obstetrics

## 2012-05-21 DIAGNOSIS — O479 False labor, unspecified: Secondary | ICD-10-CM | POA: Insufficient documentation

## 2012-05-21 LAB — CBC WITH DIFFERENTIAL/PLATELET
Eosinophils Relative: 1 % (ref 0–5)
MCH: 23.3 pg — ABNORMAL LOW (ref 26.0–34.0)
Monocytes Absolute: 0.6 10*3/uL (ref 0.1–1.0)
Monocytes Relative: 8 % (ref 3–12)
Neutrophils Relative %: 65 % (ref 43–77)
Platelets: 496 10*3/uL — ABNORMAL HIGH (ref 150–400)
RBC: 4.72 MIL/uL (ref 3.87–5.11)
WBC: 7.9 10*3/uL (ref 4.0–10.5)

## 2012-05-21 LAB — TYPE AND SCREEN

## 2012-05-21 SURGERY — Surgical Case
Anesthesia: Spinal

## 2012-05-21 MED ORDER — FAMOTIDINE IN NACL 20-0.9 MG/50ML-% IV SOLN
20.0000 mg | Freq: Once | INTRAVENOUS | Status: DC
  Filled 2012-05-21: qty 50

## 2012-05-21 MED ORDER — CITRIC ACID-SODIUM CITRATE 334-500 MG/5ML PO SOLN
30.0000 mL | Freq: Once | ORAL | Status: DC
  Filled 2012-05-21: qty 15

## 2012-05-21 MED ORDER — CEFAZOLIN SODIUM-DEXTROSE 2-3 GM-% IV SOLR: 2.0000 g | Freq: Once | INTRAVENOUS | Status: DC

## 2012-05-21 MED ORDER — DEXTROSE 5 % IV SOLN: 3.0000 g | INTRAVENOUS | Status: DC

## 2012-05-21 MED ORDER — DEXTROSE IN LACTATED RINGERS 5 % IV SOLN
INTRAVENOUS | Status: DC
  Administered 2012-05-21: 11:00:00 via INTRAVENOUS

## 2012-05-21 SURGICAL SUPPLY — 40 items
APL SKNCLS STERI-STRIP NONHPOA (GAUZE/BANDAGES/DRESSINGS) ×1
BENZOIN TINCTURE PRP APPL 2/3 (GAUZE/BANDAGES/DRESSINGS) ×2 IMPLANT
CANISTER WOUND CARE 500ML ATS (WOUND CARE) IMPLANT
CLOTH BEACON ORANGE TIMEOUT ST (SAFETY) ×2 IMPLANT
CONTAINER PREFILL 10% NBF 15ML (MISCELLANEOUS) IMPLANT
DRAPE LG THREE QUARTER DISP (DRAPES) ×2 IMPLANT
DRSG OPSITE POSTOP 4X10 (GAUZE/BANDAGES/DRESSINGS) ×2 IMPLANT
DRSG VAC ATS LRG SENSATRAC (GAUZE/BANDAGES/DRESSINGS) IMPLANT
DRSG VAC ATS MED SENSATRAC (GAUZE/BANDAGES/DRESSINGS) IMPLANT
DRSG VAC ATS SM SENSATRAC (GAUZE/BANDAGES/DRESSINGS) IMPLANT
DURAPREP 26ML APPLICATOR (WOUND CARE) ×2 IMPLANT
ELECT REM PT RETURN 9FT ADLT (ELECTROSURGICAL) ×2
ELECTRODE REM PT RTRN 9FT ADLT (ELECTROSURGICAL) ×1 IMPLANT
EXTRACTOR VACUUM M CUP 4 TUBE (SUCTIONS) IMPLANT
GLOVE BIO SURGEON STRL SZ 6.5 (GLOVE) ×2 IMPLANT
GOWN STRL REIN XL XLG (GOWN DISPOSABLE) ×4 IMPLANT
KIT ABG SYR 3ML LUER SLIP (SYRINGE) IMPLANT
NDL HYPO 25X5/8 SAFETYGLIDE (NEEDLE) ×1 IMPLANT
NEEDLE HYPO 25X5/8 SAFETYGLIDE (NEEDLE) ×2 IMPLANT
NS IRRIG 1000ML POUR BTL (IV SOLUTION) ×2 IMPLANT
PACK C SECTION WH (CUSTOM PROCEDURE TRAY) ×2 IMPLANT
PAD OB MATERNITY 4.3X12.25 (PERSONAL CARE ITEMS) ×2 IMPLANT
RTRCTR C-SECT PINK 25CM LRG (MISCELLANEOUS) IMPLANT
SLEEVE SCD COMPRESS KNEE MED (MISCELLANEOUS) IMPLANT
STAPLER VISISTAT 35W (STAPLE) IMPLANT
STRIP CLOSURE SKIN 1/2X4 (GAUZE/BANDAGES/DRESSINGS) ×2 IMPLANT
SUT MNCRL 0 VIOLET CTX 36 (SUTURE) ×2 IMPLANT
SUT MNCRL AB 3-0 PS2 27 (SUTURE) IMPLANT
SUT MONOCRYL 0 CTX 36 (SUTURE) ×2
SUT PDS AB 0 CTX 36 PDP370T (SUTURE) ×2 IMPLANT
SUT PLAIN 0 NONE (SUTURE) IMPLANT
SUT VIC AB 0 CTXB 36 (SUTURE) IMPLANT
SUT VIC AB 2-0 CT1 (SUTURE) ×2 IMPLANT
SUT VIC AB 2-0 CT1 27 (SUTURE) ×2
SUT VIC AB 2-0 CT1 TAPERPNT 27 (SUTURE) ×1 IMPLANT
SUT VIC AB 2-0 SH 27 (SUTURE)
SUT VIC AB 2-0 SH 27XBRD (SUTURE) IMPLANT
TOWEL OR 17X24 6PK STRL BLUE (TOWEL DISPOSABLE) ×6 IMPLANT
TRAY FOLEY CATH 14FR (SET/KITS/TRAYS/PACK) ×2 IMPLANT
WATER STERILE IRR 1000ML POUR (IV SOLUTION) ×2 IMPLANT

## 2012-05-21 NOTE — MAU Note (Signed)
Patient states her MD sent her to MAU for C/S due to large baby

## 2012-05-21 NOTE — Progress Notes (Signed)
Milk at 0800 today, food last evening/note ammended

## 2012-05-21 NOTE — MAU Note (Signed)
Dr. Tamela Oddi notified of SVE.  Orders given.  Pt to PO hydrate and monitor x1hour then reassess and notify Dr. Tamela Oddi.  Discussed POC with patient, pt verbalizes understanding of plan.

## 2012-05-21 NOTE — MAU Note (Signed)
All phone calls per phone tree made to inform for 1200 C/S

## 2012-05-21 NOTE — Anesthesia Preprocedure Evaluation (Signed)
Anesthesia Evaluation  Patient identified by MRN, date of birth, ID band Patient awake    Reviewed: Allergy & Precautions, H&P , NPO status , Patient's Chart, lab work & pertinent test results, reviewed documented beta blocker date and time   History of Anesthesia Complications Negative for: history of anesthetic complications  Airway Mallampati: II TM Distance: >3 FB Neck ROM: full    Dental  (+) Teeth Intact   Pulmonary asthma (last inhaler use over one month ago, no hospitalizations, last prednisone 06/2011) , Current Smoker (1/2 ppd),  breath sounds clear to auscultation        Cardiovascular negative cardio ROS  Rhythm:regular Rate:Normal     Neuro/Psych negative neurological ROS  negative psych ROS   GI/Hepatic negative GI ROS, Neg liver ROS,   Endo/Other  diabetes, Type 2, Oral Hypoglycemic AgentsS/p partial pancreatectomy for pancreatic pseudocyst in 09/2010 Obese (BMI 34)  Renal/GU negative Renal ROS  negative genitourinary   Musculoskeletal   Abdominal   Peds  Hematology negative hematology ROS (+)   Anesthesia Other Findings Drank whole milk at 8 am  Reproductive/Obstetrics (+) Pregnancy (macrosomia)                           Anesthesia Physical Anesthesia Plan  ASA: III  Anesthesia Plan: Spinal   Post-op Pain Management:    Induction:   Airway Management Planned:   Additional Equipment:   Intra-op Plan:   Post-operative Plan:   Informed Consent: I have reviewed the patients History and Physical, chart, labs and discussed the procedure including the risks, benefits and alternatives for the proposed anesthesia with the patient or authorized representative who has indicated his/her understanding and acceptance.     Plan Discussed with: Surgeon and CRNA  Anesthesia Plan Comments:         Anesthesia Quick Evaluation

## 2012-05-22 ENCOUNTER — Encounter (HOSPITAL_COMMUNITY): Payer: Self-pay | Admitting: *Deleted

## 2012-05-22 NOTE — H&P (Signed)
[name] is a 27 y.o. female presenting for a scheduled C/D. Maternal Medical History:  Reason for admission: During the pregnancy, the patient's diabetes has been in borderline control.  A recent Hgb A1c was 7.  An U/S for growth predicts an EFW of 4500 g or greater with an AC at > 97%.  Contractions: Frequency: irregular.    Fetal activity: Perceived fetal activity is normal.    Prenatal Complications - Diabetes: type 2. Diabetes is managed by oral agent (monotherapy).      OB History   Grav Para Term Preterm Abortions TAB SAB Ect Mult Living   4    3 1 2         Past Medical History  Diagnosis Date  . Asthma   . Pelvic inflammatory disease   . Pancreatitis   . Polycystic ovary syndrome   . Pancreatic cyst   . 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 Splenectomy, total  2012  . Pancreas surgery  2012    s/p partial pancreatectomy   Family History: family history includes Diabetes in her mother.  There is no history of Colon cancer. Social History:  reports that she has been smoking Cigarettes.  She has a 2.6 pack-year smoking history. She has never used smokeless tobacco. She reports that she does not drink alcohol or use illicit drugs.     Review of Systems  Constitutional: Negative for fever.  Eyes: Negative for blurred vision.  Respiratory: Negative for shortness of breath.   Gastrointestinal: Negative for vomiting.  Skin: Negative for rash.  Neurological: Negative for headaches.      Last menstrual period 08/15/2011. Maternal Exam:  Abdomen: not evaluated.  Introitus: not evaluated.   Cervix: Cervix evaluated by digital exam.     Fetal Exam Fetal Monitor Review: Variability: moderate (6-25 bpm).   Pattern: accelerations present and no decelerations.    Fetal State Assessment: Category I - tracings are normal.     Physical Exam   Constitutional: She appears well-developed.  HENT:  Head: Normocephalic.  Neck: Neck supple. No thyromegaly present.  Cardiovascular: Normal rate and regular rhythm.   Respiratory: Breath sounds normal.  GI: Soft. Bowel sounds are normal.  Skin: No rash noted.    Prenatal labs: ABO, Rh: --/--/B POS, B POS (03/17 1120) Antibody: NEG (03/17 1120) Rubella: 111.1 (11/18 1059) RPR: NON REAC (11/18 1059)  HBsAg: NEGATIVE (11/18 1059)  HIV: NON REACTIVE (11/18 1059)  GBS: Negative (02/28 0000)   Assessment/Plan: [place] @ [redacted]w[redacted]d.  Pre-gestational DM, BDM, borderline control.  Suspected fetal macrosomia.  Admit C/D   JACKSON-MOORE,Tamura Lasky A 05/22/2012, 7:26 PM

## 2012-05-23 ENCOUNTER — Inpatient Hospital Stay (HOSPITAL_COMMUNITY)
Admission: RE | Admit: 2012-05-23 | Discharge: 2012-05-26 | DRG: 765 | Disposition: A | Discharge status: Home / Self Care | Payer: Medicaid Other | Source: Ambulatory Visit | Attending: Obstetrics & Gynecology | Admitting: Obstetrics & Gynecology

## 2012-05-23 ENCOUNTER — Encounter (HOSPITAL_COMMUNITY): Payer: Self-pay | Admitting: Anesthesiology

## 2012-05-23 ENCOUNTER — Encounter (HOSPITAL_COMMUNITY)
Admission: RE | Disposition: A | Discharge status: Home / Self Care | Payer: Self-pay | Source: Ambulatory Visit | Attending: Obstetrics & Gynecology

## 2012-05-23 ENCOUNTER — Inpatient Hospital Stay (HOSPITAL_COMMUNITY): Payer: Medicaid Other | Admitting: Anesthesiology

## 2012-05-23 ENCOUNTER — Inpatient Hospital Stay (HOSPITAL_COMMUNITY)
Admission: RE | Admit: 2012-05-23 | Payer: Medicaid Other | Source: Ambulatory Visit | Admitting: Obstetrics & Gynecology

## 2012-05-23 ENCOUNTER — Encounter (HOSPITAL_COMMUNITY): Admission: RE | Payer: Self-pay | Source: Ambulatory Visit

## 2012-05-23 DIAGNOSIS — E119 Type 2 diabetes mellitus without complications: Secondary | ICD-10-CM | POA: Diagnosis present

## 2012-05-23 DIAGNOSIS — O3660X0 Maternal care for excessive fetal growth, unspecified trimester, not applicable or unspecified: Principal | ICD-10-CM | POA: Diagnosis present

## 2012-05-23 DIAGNOSIS — O99334 Smoking (tobacco) complicating childbirth: Secondary | ICD-10-CM | POA: Diagnosis present

## 2012-05-23 DIAGNOSIS — IMO0001 Reserved for inherently not codable concepts without codable children: Secondary | ICD-10-CM | POA: Diagnosis present

## 2012-05-23 DIAGNOSIS — O2432 Unspecified pre-existing diabetes mellitus in childbirth: Secondary | ICD-10-CM | POA: Diagnosis present

## 2012-05-23 HISTORY — DX: Type 2 diabetes mellitus without complications: E11.9

## 2012-05-23 LAB — BASIC METABOLIC PANEL
BUN: 6 mg/dL (ref 6–23)
Creatinine, Ser: 0.6 mg/dL (ref 0.50–1.10)
GFR calc Af Amer: >90 mL/min (ref >90)
GFR calc non Af Amer: >90 mL/min (ref >90)
Potassium: 4.1 mEq/L (ref 3.5–5.1)

## 2012-05-23 SURGERY — Surgical Case
Anesthesia: Regional

## 2012-05-23 SURGERY — Surgical Case
Anesthesia: Spinal | Wound class: Clean Contaminated

## 2012-05-23 MED ORDER — NALBUPHINE HCL 10 MG/ML IJ SOLN
5.0000 mg | INTRAMUSCULAR | Status: DC | PRN
  Administered 2012-05-23 (×2): 10 mg via INTRAVENOUS
  Filled 2012-05-23 (×3): qty 1

## 2012-05-23 MED ORDER — PRENATAL MULTIVITAMIN CH
1.0000 | ORAL_TABLET | Freq: Every day | ORAL | Status: DC
  Administered 2012-05-24 – 2012-05-25 (×2): 1 via ORAL
  Filled 2012-05-23 (×2): qty 1

## 2012-05-23 MED ORDER — MEPERIDINE HCL 25 MG/ML IJ SOLN: 6.2500 mg | INTRAMUSCULAR | Status: DC | PRN

## 2012-05-23 MED ORDER — LACTATED RINGERS IV SOLN
INTRAVENOUS | Status: DC
  Administered 2012-05-23: via INTRAVENOUS

## 2012-05-23 MED ORDER — IBUPROFEN 600 MG PO TABS
600.0000 mg | ORAL_TABLET | Freq: Four times a day (QID) | ORAL | Status: DC
  Administered 2012-05-24 – 2012-05-26 (×9): 600 mg via ORAL

## 2012-05-23 MED ORDER — CEFAZOLIN SODIUM-DEXTROSE 2-3 GM-% IV SOLR
2.0000 g | INTRAVENOUS | Status: AC
  Administered 2012-05-23: 2 g via INTRAVENOUS

## 2012-05-23 MED ORDER — DIPHENHYDRAMINE HCL 50 MG/ML IJ SOLN: 12.5000 mg | INTRAMUSCULAR | Status: DC | PRN

## 2012-05-23 MED ORDER — FERROUS SULFATE 325 (65 FE) MG PO TABS
325.0000 mg | ORAL_TABLET | Freq: Two times a day (BID) | ORAL | Status: DC
  Administered 2012-05-24 – 2012-05-26 (×5): 325 mg via ORAL
  Filled 2012-05-23 (×6): qty 1

## 2012-05-23 MED ORDER — NALOXONE HCL 0.4 MG/ML IJ SOLN: 0.4000 mg | INTRAMUSCULAR | Status: DC | PRN

## 2012-05-23 MED ORDER — SCOPOLAMINE 1 MG/3DAYS TD PT72: 1.0000 | MEDICATED_PATCH | Freq: Once | TRANSDERMAL | Status: DC

## 2012-05-23 MED ORDER — DIBUCAINE 1 % RE OINT: 1.0000 "application " | TOPICAL_OINTMENT | RECTAL | Status: DC | PRN

## 2012-05-23 MED ORDER — OXYTOCIN 10 UNIT/ML IJ SOLN
INTRAMUSCULAR | Status: AC
  Filled 2012-05-23: qty 4

## 2012-05-23 MED ORDER — OXYCODONE-ACETAMINOPHEN 5-325 MG PO TABS
1.0000 | ORAL_TABLET | ORAL | Status: DC | PRN
  Administered 2012-05-23: 1 via ORAL
  Administered 2012-05-24: 2 via ORAL
  Administered 2012-05-24 (×2): 1 via ORAL
  Administered 2012-05-24: 2 via ORAL
  Administered 2012-05-24 – 2012-05-25 (×2): 1 via ORAL
  Administered 2012-05-25: 2 via ORAL
  Administered 2012-05-25 – 2012-05-26 (×2): 1 via ORAL
  Administered 2012-05-26: 2 via ORAL
  Filled 2012-05-23: qty 1
  Filled 2012-05-23 (×2): qty 2
  Filled 2012-05-23: qty 1
  Filled 2012-05-23: qty 2
  Filled 2012-05-23: qty 1
  Filled 2012-05-23: qty 2
  Filled 2012-05-23 (×3): qty 1
  Filled 2012-05-23: qty 2

## 2012-05-23 MED ORDER — SODIUM CHLORIDE 0.9 % IJ SOLN: 3.0000 mL | INTRAMUSCULAR | Status: DC | PRN

## 2012-05-23 MED ORDER — PHENYLEPHRINE HCL 10 MG/ML IJ SOLN
INTRAMUSCULAR | Status: DC | PRN
  Administered 2012-05-23 (×3): 80 ug via INTRAVENOUS
  Administered 2012-05-23: 40 ug via INTRAVENOUS
  Administered 2012-05-23: 80 ug via INTRAVENOUS

## 2012-05-23 MED ORDER — SCOPOLAMINE 1 MG/3DAYS TD PT72
MEDICATED_PATCH | TRANSDERMAL | Status: AC
  Administered 2012-05-23: 1.5 mg via TRANSDERMAL
  Filled 2012-05-23: qty 1

## 2012-05-23 MED ORDER — LACTATED RINGERS IV SOLN
INTRAVENOUS | Status: DC | PRN
  Administered 2012-05-23: 11:00:00 via INTRAVENOUS

## 2012-05-23 MED ORDER — ONDANSETRON HCL 4 MG PO TABS: 4.0000 mg | ORAL_TABLET | ORAL | Status: DC | PRN

## 2012-05-23 MED ORDER — MEASLES, MUMPS & RUBELLA VAC ~~LOC~~ INJ
0.5000 mL | INJECTION | Freq: Once | SUBCUTANEOUS | Status: DC
Start: 2012-05-24 — End: 2012-05-26

## 2012-05-23 MED ORDER — FENTANYL CITRATE 0.05 MG/ML IJ SOLN
INTRAMUSCULAR | Status: DC | PRN
  Administered 2012-05-23: 15 ug via INTRATHECAL
  Administered 2012-05-23: 85 ug via INTRAVENOUS
  Administered 2012-05-23: 100 ug via INTRAVENOUS

## 2012-05-23 MED ORDER — FENTANYL CITRATE 0.05 MG/ML IJ SOLN
INTRAMUSCULAR | Status: AC
  Filled 2012-05-23: qty 2

## 2012-05-23 MED ORDER — LIDOCAINE-EPINEPHRINE (PF) 2 %-1:200000 IJ SOLN
INTRAMUSCULAR | Status: DC | PRN
  Administered 2012-05-23: 20 mL

## 2012-05-23 MED ORDER — 0.9 % SODIUM CHLORIDE (POUR BTL) OPTIME
TOPICAL | Status: DC | PRN
  Administered 2012-05-23: 1000 mL

## 2012-05-23 MED ORDER — KETOROLAC TROMETHAMINE 60 MG/2ML IM SOLN
INTRAMUSCULAR | Status: AC
  Administered 2012-05-23: 60 mg via INTRAMUSCULAR
  Filled 2012-05-23: qty 2

## 2012-05-23 MED ORDER — MORPHINE SULFATE 0.5 MG/ML IJ SOLN
INTRAMUSCULAR | Status: AC
  Filled 2012-05-23: qty 10

## 2012-05-23 MED ORDER — MORPHINE SULFATE 4 MG/ML IJ SOLN
2.0000 mg | INTRAMUSCULAR | Status: AC | PRN
  Administered 2012-05-23 (×2): 2 mg via INTRAVENOUS

## 2012-05-23 MED ORDER — ACETAMINOPHEN 10 MG/ML IV SOLN
1000.0000 mg | Freq: Four times a day (QID) | INTRAVENOUS | Status: DC
  Administered 2012-05-23: 1000 mg via INTRAVENOUS
  Filled 2012-05-23 (×3): qty 100

## 2012-05-23 MED ORDER — LANOLIN HYDROUS EX OINT: 1.0000 "application " | TOPICAL_OINTMENT | CUTANEOUS | Status: DC | PRN

## 2012-05-23 MED ORDER — MORPHINE SULFATE (PF) 0.5 MG/ML IJ SOLN
INTRAMUSCULAR | Status: DC | PRN
  Administered 2012-05-23: 9.9 mg via EPIDURAL
  Administered 2012-05-23: .1 mg via INTRATHECAL

## 2012-05-23 MED ORDER — HYDROMORPHONE HCL PF 1 MG/ML IJ SOLN: 0.2500 mg | INTRAMUSCULAR | Status: DC | PRN

## 2012-05-23 MED ORDER — KETOROLAC TROMETHAMINE 30 MG/ML IJ SOLN: 30.0000 mg | Freq: Four times a day (QID) | INTRAMUSCULAR | Status: AC | PRN

## 2012-05-23 MED ORDER — ONDANSETRON HCL 4 MG/2ML IJ SOLN: 4.0000 mg | Freq: Three times a day (TID) | INTRAMUSCULAR | Status: DC | PRN

## 2012-05-23 MED ORDER — FENTANYL CITRATE 0.05 MG/ML IJ SOLN
INTRAMUSCULAR | Status: AC
  Administered 2012-05-23: 100 ug via INTRAVENOUS
  Filled 2012-05-23: qty 2

## 2012-05-23 MED ORDER — SENNOSIDES-DOCUSATE SODIUM 8.6-50 MG PO TABS
2.0000 | ORAL_TABLET | Freq: Every day | ORAL | Status: DC
  Administered 2012-05-23 – 2012-05-24 (×2): 2 via ORAL

## 2012-05-23 MED ORDER — DIPHENHYDRAMINE HCL 25 MG PO CAPS
25.0000 mg | ORAL_CAPSULE | ORAL | Status: DC | PRN
  Administered 2012-05-23 – 2012-05-24 (×3): 25 mg via ORAL
  Filled 2012-05-23 (×4): qty 1

## 2012-05-23 MED ORDER — SIMETHICONE 80 MG PO CHEW
80.0000 mg | CHEWABLE_TABLET | ORAL | Status: DC | PRN
  Administered 2012-05-24 – 2012-05-25 (×5): 80 mg via ORAL

## 2012-05-23 MED ORDER — NALBUPHINE HCL 10 MG/ML IJ SOLN
5.0000 mg | INTRAMUSCULAR | Status: DC | PRN
  Administered 2012-05-24: 10 mg via SUBCUTANEOUS
  Filled 2012-05-23 (×2): qty 1

## 2012-05-23 MED ORDER — BUPIVACAINE IN DEXTROSE 0.75-8.25 % IT SOLN
INTRATHECAL | Status: DC | PRN
  Administered 2012-05-23: 1.4 mg via INTRATHECAL

## 2012-05-23 MED ORDER — MEDROXYPROGESTERONE ACETATE 150 MG/ML IM SUSP: 150.0000 mg | INTRAMUSCULAR | Status: DC | PRN

## 2012-05-23 MED ORDER — LIDOCAINE-EPINEPHRINE (PF) 2 %-1:200000 IJ SOLN
INTRAMUSCULAR | Status: AC
  Filled 2012-05-23: qty 20

## 2012-05-23 MED ORDER — KETOROLAC TROMETHAMINE 60 MG/2ML IM SOLN: 60.0000 mg | Freq: Once | INTRAMUSCULAR | Status: AC | PRN

## 2012-05-23 MED ORDER — FENTANYL CITRATE 0.05 MG/ML IJ SOLN
25.0000 ug | INTRAMUSCULAR | Status: DC | PRN
  Administered 2012-05-23 (×2): 50 ug via INTRAVENOUS

## 2012-05-23 MED ORDER — OXYTOCIN 40 UNITS IN LACTATED RINGERS INFUSION - SIMPLE MED: 62.5000 mL/h | INTRAVENOUS | Status: AC

## 2012-05-23 MED ORDER — DEXTROSE 5 % IV SOLN
1.0000 ug/kg/h | INTRAVENOUS | Status: DC | PRN
  Filled 2012-05-23: qty 2

## 2012-05-23 MED ORDER — ONDANSETRON HCL 4 MG/2ML IJ SOLN: 4.0000 mg | INTRAMUSCULAR | Status: DC | PRN

## 2012-05-23 MED ORDER — ONDANSETRON HCL 4 MG/2ML IJ SOLN
INTRAMUSCULAR | Status: DC | PRN
  Administered 2012-05-23: 4 mg via INTRAVENOUS

## 2012-05-23 MED ORDER — METOCLOPRAMIDE HCL 5 MG/ML IJ SOLN: 10.0000 mg | Freq: Three times a day (TID) | INTRAMUSCULAR | Status: DC | PRN

## 2012-05-23 MED ORDER — DIPHENHYDRAMINE HCL 25 MG PO CAPS: 25.0000 mg | ORAL_CAPSULE | Freq: Four times a day (QID) | ORAL | Status: DC | PRN

## 2012-05-23 MED ORDER — MAGNESIUM HYDROXIDE 400 MG/5ML PO SUSP: 30.0000 mL | ORAL | Status: DC | PRN

## 2012-05-23 MED ORDER — OXYTOCIN 10 UNIT/ML IJ SOLN
40.0000 [IU] | INTRAVENOUS | Status: DC | PRN
  Administered 2012-05-23: 40 [IU] via INTRAVENOUS

## 2012-05-23 MED ORDER — TETANUS-DIPHTH-ACELL PERTUSSIS 5-2.5-18.5 LF-MCG/0.5 IM SUSP: 0.5000 mL | Freq: Once | INTRAMUSCULAR | Status: DC

## 2012-05-23 MED ORDER — ZOLPIDEM TARTRATE 5 MG PO TABS: 5.0000 mg | ORAL_TABLET | Freq: Every evening | ORAL | Status: DC | PRN

## 2012-05-23 MED ORDER — ONDANSETRON HCL 4 MG/2ML IJ SOLN
INTRAMUSCULAR | Status: AC
  Filled 2012-05-23: qty 2

## 2012-05-23 MED ORDER — WITCH HAZEL-GLYCERIN EX PADS: 1.0000 "application " | MEDICATED_PAD | CUTANEOUS | Status: DC | PRN

## 2012-05-23 MED ORDER — LACTATED RINGERS IV SOLN
INTRAVENOUS | Status: DC
  Administered 2012-05-23 (×4): via INTRAVENOUS

## 2012-05-23 MED ORDER — DIPHENHYDRAMINE HCL 50 MG/ML IJ SOLN: 25.0000 mg | INTRAMUSCULAR | Status: DC | PRN

## 2012-05-23 MED ORDER — IBUPROFEN 600 MG PO TABS
600.0000 mg | ORAL_TABLET | Freq: Four times a day (QID) | ORAL | Status: DC | PRN
  Administered 2012-05-23: 600 mg via ORAL
  Filled 2012-05-23 (×10): qty 1

## 2012-05-23 MED ORDER — MORPHINE SULFATE 4 MG/ML IJ SOLN
INTRAMUSCULAR | Status: AC
  Filled 2012-05-23: qty 1

## 2012-05-23 SURGICAL SUPPLY — 40 items
APL SKNCLS STERI-STRIP NONHPOA (GAUZE/BANDAGES/DRESSINGS) ×1
BENZOIN TINCTURE PRP APPL 2/3 (GAUZE/BANDAGES/DRESSINGS) ×2 IMPLANT
CANISTER WOUND CARE 500ML ATS (WOUND CARE) IMPLANT
CLOTH BEACON ORANGE TIMEOUT ST (SAFETY) ×2 IMPLANT
CONTAINER PREFILL 10% NBF 15ML (MISCELLANEOUS) IMPLANT
DRAPE LG THREE QUARTER DISP (DRAPES) ×2 IMPLANT
DRSG OPSITE POSTOP 4X10 (GAUZE/BANDAGES/DRESSINGS) ×2 IMPLANT
DRSG VAC ATS LRG SENSATRAC (GAUZE/BANDAGES/DRESSINGS) IMPLANT
DRSG VAC ATS MED SENSATRAC (GAUZE/BANDAGES/DRESSINGS) IMPLANT
DRSG VAC ATS SM SENSATRAC (GAUZE/BANDAGES/DRESSINGS) IMPLANT
DURAPREP 26ML APPLICATOR (WOUND CARE) ×2 IMPLANT
ELECT REM PT RETURN 9FT ADLT (ELECTROSURGICAL) ×2
ELECTRODE REM PT RTRN 9FT ADLT (ELECTROSURGICAL) ×1 IMPLANT
EXTRACTOR VACUUM M CUP 4 TUBE (SUCTIONS) IMPLANT
GLOVE BIO SURGEON STRL SZ 6.5 (GLOVE) ×2 IMPLANT
GOWN STRL REIN XL XLG (GOWN DISPOSABLE) ×4 IMPLANT
KIT ABG SYR 3ML LUER SLIP (SYRINGE) IMPLANT
NDL HYPO 25X5/8 SAFETYGLIDE (NEEDLE) ×1 IMPLANT
NEEDLE HYPO 25X5/8 SAFETYGLIDE (NEEDLE) ×2 IMPLANT
NS IRRIG 1000ML POUR BTL (IV SOLUTION) ×2 IMPLANT
PACK C SECTION WH (CUSTOM PROCEDURE TRAY) ×2 IMPLANT
PAD OB MATERNITY 4.3X12.25 (PERSONAL CARE ITEMS) ×2 IMPLANT
RTRCTR C-SECT PINK 25CM LRG (MISCELLANEOUS) IMPLANT
SLEEVE SCD COMPRESS KNEE MED (MISCELLANEOUS) IMPLANT
STAPLER VISISTAT 35W (STAPLE) IMPLANT
STRIP CLOSURE SKIN 1/2X4 (GAUZE/BANDAGES/DRESSINGS) ×2 IMPLANT
SUT MNCRL 0 VIOLET CTX 36 (SUTURE) ×2 IMPLANT
SUT MNCRL AB 3-0 PS2 27 (SUTURE) IMPLANT
SUT MONOCRYL 0 CTX 36 (SUTURE) ×2
SUT PDS AB 0 CTX 36 PDP370T (SUTURE) ×2 IMPLANT
SUT PLAIN 0 NONE (SUTURE) IMPLANT
SUT VIC AB 0 CTXB 36 (SUTURE) IMPLANT
SUT VIC AB 2-0 CT1 (SUTURE) ×2 IMPLANT
SUT VIC AB 2-0 CT1 27 (SUTURE) ×2
SUT VIC AB 2-0 CT1 TAPERPNT 27 (SUTURE) ×1 IMPLANT
SUT VIC AB 2-0 SH 27 (SUTURE)
SUT VIC AB 2-0 SH 27XBRD (SUTURE) IMPLANT
TOWEL OR 17X24 6PK STRL BLUE (TOWEL DISPOSABLE) ×6 IMPLANT
TRAY FOLEY CATH 14FR (SET/KITS/TRAYS/PACK) ×2 IMPLANT
WATER STERILE IRR 1000ML POUR (IV SOLUTION) ×2 IMPLANT

## 2012-05-23 NOTE — Anesthesia Postprocedure Evaluation (Signed)
  Anesthesia Post-op Note  Anesthesia Post Note  Patient: [name]  Procedure(s) Performed: Procedure(s) (LRB): CESAREAN SECTION (N/A)  Anesthesia type: Spinal  Patient location: PACU  Post pain: Pain level controlled  Post assessment: Post-op Vital signs reviewed  Last Vitals:  Filed Vitals:   05/23/12 1245  BP: 99/73  Pulse: 77  Temp:   Resp: 18    Post vital signs: Reviewed  Level of consciousness: awake  Complications: No apparent anesthesia complications

## 2012-05-23 NOTE — Anesthesia Procedure Notes (Signed)
Spinal  Patient location during procedure: OR Start time: 05/23/2012 10:14 AM Staffing Performed by: anesthesiologist  Preanesthetic Checklist Completed: patient identified, site marked, surgical consent, pre-op evaluation, timeout performed, IV checked, risks and benefits discussed and monitors and equipment checked Spinal Block Patient position: sitting Prep: site prepped and draped and DuraPrep Patient monitoring: heart rate, cardiac monitor, continuous pulse ox and blood pressure Approach: midline Location: L3-4 Injection technique: single-shot Needle Needle type: Sprotte  Needle gauge: 24 G Needle length: 9 cm Assessment Sensory level: T4 Additional Notes Clear free flow CSF on first attempt.  No paresthesia.  Patient tolerated procedure well with no apparent complications.  Jasmine December, MD

## 2012-05-23 NOTE — Progress Notes (Signed)
At 1155, 50 mcg of Fentanyl given and not 

## 2012-05-23 NOTE — Interval H&P Note (Signed)
History and Physical Interval Note:  05/23/2012 9:28 AM  [name]  has presented today for surgery, with the diagnosis of Repeat;IUP @39wks ;diabetic  The various methods of treatment have been discussed with the patient and family. After consideration of risks, benefits and other options for treatment, the patient has consented to  Procedure(s): CESAREAN SECTION (N/A) as a surgical intervention .  The patient's history has been reviewed, patient examined, no change in status, stable for surgery.  I have reviewed the patient's chart and labs.  Questions were answered to the patient's satisfaction.     JACKSON-MOORE,Deborah Lazcano A

## 2012-05-23 NOTE — Consult Note (Addendum)
Neonatology Note:  Attendance at C-section:  I was asked by Dr. Jackson-Moore to attend this primary C/S at term due to macrosomia. The mother is a G4P0A3 B pos, GBS neg with borderline-controlled diabetes, on glyburide. Mother is a cigarette smoker and has a history of PID, pancreatic cyst (s/p partial resection of pancreas), total splenectomy, and ovarian cyst. ROM at delivery, fluid with thin meconium. Infant vigorous with good spontaneous cry and tone. Needed only minimal bulb suctioning. Ap 8/9. Baby is LGA and his lungs are clear to ausc in DR. Spoke with his parents to inform them of his increased risk for hypoglycemia and necessary monitoring for this. To CN to care of Pediatrician.  Evanne Matsunaga C. Aragorn Recker, MD  

## 2012-05-23 NOTE — Progress Notes (Signed)
Pt. Refused to ambulate and sit on edge of bed at 6 hour mark. Education done as to benefits of early ambulation. Stated that her pain was uncomfortable and she would like to wait till later tonight when she "feels ready". Pt given 2nd dose of morphine 2mg  IV, for a total of 4mg  over 3 hours. Will reassess. SCD on.

## 2012-05-23 NOTE — Anesthesia Preprocedure Evaluation (Addendum)
Anesthesia Evaluation  Patient identified by MRN, date of birth, ID band Patient awake    Reviewed: Allergy & Precautions, H&P , NPO status , Patient's Chart, lab work & pertinent test results, reviewed documented beta blocker date and time   History of Anesthesia Complications Negative for: history of anesthetic complications  Airway Mallampati: II TM Distance: >3 FB Neck ROM: full    Dental no notable dental hx. (+) Teeth Intact   Pulmonary asthma (last inhaler use over one month ago, no hospitalizations, last prednisone 06/2011) , Current Smoker (1/2 ppd),  breath sounds clear to auscultation  Pulmonary exam normal       Cardiovascular Exercise Tolerance: Good negative cardio ROS  Rhythm:regular Rate:Normal     Neuro/Psych negative neurological ROS  negative psych ROS   GI/Hepatic negative GI ROS, Neg liver ROS,   Endo/Other  diabetes, Type 2, Oral Hypoglycemic AgentsMorbid obesityS/p partial pancreatectomy for pancreatic pseudocyst in 09/2010 Obese (BMI 34)  Renal/GU negative Renal ROS  negative genitourinary   Musculoskeletal   Abdominal   Peds  Hematology negative hematology ROS (+)   Anesthesia Other Findings    Asthma           Pancreatitis         Pancreatic cyst      Diabetes mellitus :  partial pancreatectomy 2012-glyburide       Reproductive/Obstetrics (+) Pregnancy (macrosomia)                          Anesthesia Physical  Anesthesia Plan  ASA: III  Anesthesia Plan: Spinal   Post-op Pain Management:    Induction:   Airway Management Planned:   Additional Equipment:   Intra-op Plan:   Post-operative Plan:   Informed Consent: I have reviewed the patients History and Physical, chart, labs and discussed the procedure including the risks, benefits and alternatives for the proposed anesthesia with the patient or authorized representative who has indicated his/her  understanding and acceptance.   Dental Advisory Given  Plan Discussed with: Surgeon and CRNA  Anesthesia Plan Comments: (Lab work confirmed with CRNA in room. Platelets okay. Discussed spinal anesthetic, and patient consents to the procedure:  included risk of possible headache,backache, failed block, allergic reaction, and nerve injury. This patient was asked if she had any questions or concerns before the procedure started. )       Anesthesia Quick Evaluation

## 2012-05-23 NOTE — Op Note (Signed)
Cesarean Section Procedure Note   [name]   05/23/2012  Indications: Scheduled Proceedure/Maternal Request   Pre-operative Diagnosis: Intrauterine pregnancy at 39 weeks, gestational diabetes class B, suspected large for gestational age fetus  Post-operative Diagnosis: Same   Surgeon: [name]  Assistants: none  Anesthesia: spinal  Procedure Details:  The patient was seen in the Holding Room. The risks, benefits, complications, treatment options, and expected outcomes were discussed with the patient. The patient concurred with the proposed plan, giving informed consent. The patient was identified as [name] and the procedure verified as C-Section Delivery. A Time Out was held and the above information confirmed.  After induction of anesthesia, the patient was draped and prepped in the usual sterile manner. A transverse incision was made and carried down through the subcutaneous tissue to the fascia. The fascial incision was made and extended transversely. The fascia was separated from the underlying rectus tissue superiorly and inferiorly. The peritoneum was identified and entered. The peritoneal incision was extended longitudinally. The utero-vesical peritoneal reflection was incised transversely and the bladder flap was bluntly freed from the lower uterine segment. A low transverse uterine incision was made. Delivered from cephalic presentation was a  living newborn female infant(s). APGAR (1 MIN): 8   APGAR (5 MINS): 9       A cord ph was sent. The umbilical cord was clamped and cut cord. A sample was obtained for evaluation. The placenta was removed Intact and appeared normal.  The uterine incision was closed with running locked sutures of 1-0 Monocryl. A second imbricating layer of the same suture was placed.  Hemostasis was observed. The paracolic gutters were irrigated. The parieto peritoneum was closed in a running fashion with 2-0 Vicryl.  The fascia was  then reapproximated with running sutures of 0 PDS.  Prior to the skin closure the patient was complaining of burning sensation at the incision. At the recommendation of the CRNA, 20 cc of a dilute 2% lidocaine solution was used to infiltrate the subcutaneous layer of the incision.The skin was closed with suture.  Instrument, sponge, and needle counts were correct prior the abdominal closure and were correct at the conclusion of the case.    Findings: See above   Estimated Blood Loss: Per anesthesiology  Total IV Fluids:   Urine Output: Per anesthesiology  Specimens: Placenta  Complications: no complications  Disposition: PACU - hemodynamically stable.  Maternal Condition: stable   Baby condition / location:  nursery-stable    Signed: Surgeon(s): Antionette Char, MD

## 2012-05-23 NOTE — Transfer of Care (Signed)
Immediate Anesthesia Transfer of Care Note  Patient: [name]  Procedure(s) Performed: Procedure(s) with comments: CESAREAN SECTION (N/A) - primary  Patient Location: PACU  Anesthesia Type:Spinal  Level of Consciousness: awake, alert , oriented and patient cooperative  Airway & Oxygen Therapy: Patient Spontanous Breathing  Post-op Assessment: Report given to PACU RN and Post -op Vital signs reviewed and stable  Post vital signs: Reviewed and stable  Complications: No apparent anesthesia complications

## 2012-05-24 ENCOUNTER — Encounter (HOSPITAL_COMMUNITY): Payer: Self-pay | Admitting: *Deleted

## 2012-05-24 LAB — CBC
MCH: 22.9 pg — ABNORMAL LOW (ref 26.0–34.0)
MCV: 68.8 fL — ABNORMAL LOW (ref 78.0–100.0)
Platelets: 400 10*3/uL (ref 150–400)
RBC: 4.07 MIL/uL (ref 3.87–5.11)
RDW: 14.3 % (ref 11.5–15.5)
WBC: 12.5 10*3/uL — ABNORMAL HIGH (ref 4.0–10.5)

## 2012-05-24 LAB — GLUCOSE, CAPILLARY
Glucose-Capillary: 101 mg/dL — ABNORMAL HIGH (ref 70–99)
Glucose-Capillary: 125 mg/dL — ABNORMAL HIGH (ref 70–99)

## 2012-05-24 NOTE — Anesthesia Postprocedure Evaluation (Signed)
  Anesthesia Post-op Note  Patient: [name]  Procedure(s) Performed: * No procedures listed *  Patient Location: PACU and Mother/Baby  Anesthesia Type:Spinal  Level of Consciousness: awake  Airway and Oxygen Therapy: Patient Spontanous Breathing  Post-op Pain: none  Post-op Assessment: Patient's Cardiovascular Status Stable, Respiratory Function Stable, RESPIRATORY FUNCTION UNSTABLE, No signs of Nausea or vomiting, Adequate PO intake, Pain level controlled, No headache, No backache, No residual numbness and No residual motor weakness  Post-op Vital Signs: Reviewed and stable  Complications: No apparent anesthesia complications

## 2012-05-24 NOTE — Progress Notes (Signed)
Subjective: Postpartum Day 1: Cesarean Delivery Patient reports tolerating PO, + flatus and no problems voiding.    Objective: Vital signs in last 24 hours: Temp:  [97.7 F (36.5 C)-98.6 F (37 C)] 98.2 F (36.8 C) (03/20 4782) Pulse Rate:  [67-93] 67 (03/20 0638) Resp:  [16-22] 18 (03/20 0638) BP: (97-133)/(50-102) 97/58 mmHg (03/20 0638) SpO2:  [96 %-100 %] 98 % (03/20 9562) Weight:  [175 lb 8 oz (79.606 kg)] 175 lb 8 oz (79.606 kg) (03/19 1150)  Physical Exam:  General: alert and no distress Lochia: appropriate Uterine Fundus: firm Incision: healing well DVT Evaluation: No evidence of DVT seen on physical exam.   Recent Labs  05/21/12 1044 05/24/12 0650  HGB 11.0* 9.3*  HCT 31.0* 28.0*    Assessment/Plan: Status post Cesarean section. Doing well postoperatively.  Continue current care.  Sarahi Borland A 05/24/2012, 9:06 AM

## 2012-05-25 ENCOUNTER — Encounter: Payer: Self-pay | Admitting: Obstetrics

## 2012-05-25 ENCOUNTER — Encounter: Payer: Self-pay | Admitting: Obstetrics & Gynecology

## 2012-05-25 LAB — GLUCOSE, CAPILLARY: Glucose-Capillary: 157 mg/dL — ABNORMAL HIGH (ref 70–99)

## 2012-05-25 NOTE — Progress Notes (Signed)
Subjective: Postpartum Day 2: Cesarean Delivery Patient reports tolerating PO.    Objective: Vital signs in last 24 hours: Temp:  [98.2 F (36.8 C)] 98.2 F (36.8 C) (03/21 0509) Pulse Rate:  [65-72] 65 (03/21 0509) Resp:  [18] 18 (03/21 0509) BP: (100-106)/(64-73) 100/64 mmHg (03/21 0509)  Physical Exam:  General: alert and no distress Lochia: appropriate Uterine Fundus: firm Incision: healing well DVT Evaluation: No evidence of DVT seen on physical exam.   Recent Labs  05/24/12 0650  HGB 9.3*  HCT 28.0*    Assessment/Plan: Status post Cesarean section. Doing well postoperatively.  Continue current care.  HARPER,CHARLES A 05/25/2012, 11:38 AM

## 2012-05-26 MED ORDER — OXYCODONE-ACETAMINOPHEN 5-325 MG PO TABS
1.0000 | ORAL_TABLET | ORAL | Status: DC | PRN
Start: 2012-05-26 — End: 2012-07-13

## 2012-05-26 MED ORDER — IBUPROFEN 600 MG PO TABS: 600.0000 mg | ORAL_TABLET | Freq: Four times a day (QID) | ORAL | Status: DC | PRN

## 2012-05-26 NOTE — Discharge Summary (Signed)
Obstetric Discharge Summary Reason for Admission: cesarean section Prenatal Procedures: ultrasound Intrapartum Procedures: cesarean: low cervical, transverse Postpartum Procedures: none Complications-Operative and Postpartum: none Hemoglobin  Date Value Range Status  05/24/2012 9.3* 12.0 - 15.0 g/dL Final     HCT  Date Value Range Status  05/24/2012 28.0* 36.0 - 46.0 % Final    Physical Exam:  General: alert and no distress Lochia: appropriate Uterine Fundus: firm Incision: healing well DVT Evaluation: No evidence of DVT seen on physical exam.  Discharge Diagnoses: Term Pregnancy-delivered  Discharge Information: Date: 05/26/2012 Activity: pelvic rest Diet: routine Medications: PNV, Ibuprofen, Colace, Iron and Percocet Condition: stable Instructions: refer to practice specific booklet Discharge to: home Follow-up Information   Follow up with [name], MD. Schedule an appointment as soon as possible for [name] visit in 2 weeks.   Contact information:   [address] [place] [address] [phone]       Newborn Data: Live born female  Birth Weight: 9 lb 14.2 oz (4485 g) APGAR: 8, 9  Home with mother.  [name] 05/26/2012, 8:41 AM

## 2012-05-26 NOTE — Progress Notes (Signed)
Subjective: Postpartum Day 3: Cesarean Delivery Patient reports tolerating PO, + flatus, + BM and no problems voiding.    Objective: Vital signs in last 24 hours: Temp:  [97.9 F (36.6 C)-98.1 F (36.7 C)] 98.1 F (36.7 C) (03/22 0507) Pulse Rate:  [73-83] 83 (03/22 0507) Resp:  [18-20] 20 (03/22 0507) BP: (115-121)/(64-75) 121/68 mmHg (03/22 0507) SpO2:  [100 %] 100 % (03/21 1820)  Physical Exam:  General: alert and no distress Lochia: appropriate Uterine Fundus: firm Incision: healing well DVT Evaluation: No evidence of DVT seen on physical exam.   Recent Labs  05/24/12 0650  HGB 9.3*  HCT 28.0*    Assessment/Plan: Status post Cesarean section. Doing well postoperatively.  Discharge home with standard precautions and return to clinic in 2 weeks.  HARPER,CHARLES A 05/26/2012, 8:29 AM

## 2012-05-29 ENCOUNTER — Encounter: Payer: Self-pay | Admitting: Obstetrics & Gynecology

## 2012-07-05 ENCOUNTER — Encounter: Payer: Self-pay | Admitting: Obstetrics & Gynecology

## 2012-07-05 ENCOUNTER — Ambulatory Visit (INDEPENDENT_AMBULATORY_CARE_PROVIDER_SITE_OTHER): Payer: Medicaid Other | Admitting: Obstetrics & Gynecology

## 2012-07-05 VITALS — BP 147/92 | HR 75 | Temp 97.8°F | Ht 60.0 in | Wt 148.0 lb

## 2012-07-05 DIAGNOSIS — O9089 Other complications of the puerperium, not elsewhere classified: Secondary | ICD-10-CM

## 2012-07-05 MED ORDER — NORETHIN-ETH ESTRAD-FE BIPHAS 1 MG-10 MCG / 10 MCG PO TABS: 1.0000 | ORAL_TABLET | Freq: Every day | ORAL | Status: DC

## 2012-07-05 MED ORDER — FLUCONAZOLE 150 MG PO TABS: 150.0000 mg | ORAL_TABLET | Freq: Once | ORAL | Status: DC

## 2012-07-05 MED ORDER — AMOXICILLIN-POT CLAVULANATE 875-125 MG PO TABS: 1.0000 | ORAL_TABLET | Freq: Two times a day (BID) | ORAL | Status: DC

## 2012-07-05 NOTE — Progress Notes (Signed)
Subjective:     [name] is a 27 y.o. female who presents for a postpartum visit. She is 6 weeks postpartum following a low cervical transverse Cesarean section. I have fully reviewed the prenatal and intrapartum course. The delivery was at 39 gestational weeks. Outcome: primary cesarean section, low transverse incision. Anesthesia: spinal. Postpartum course has been complicated with abnormal bleeding after delivery and burning and itching in vaginal area. Baby's course has been normal. Baby is feeding by bottle - gerber-gentle. Bleeding heavy- bright red, passing small clots, vaginal burning and itching x 4-5days. Bowel function is normal. Bladder function is normal. Patient is sexually active. Contraception method is condoms. Postpartum depression screening: positive.  The following portions of the patient's history were reviewed and updated as appropriate: allergies, current medications and problem list.  Review of Systems Pertinent items are noted in HPI.   Objective:    BP 147/92  Pulse 75  Temp(Src) 97.8 F (36.6 C)  Ht 5' (1.524 m)  Wt 148 lb (67.132 kg)  BMI 28.9 kg/m2  Breastfeeding? No  General:  alert   Breasts:  not examined  Lungs: not examined  Heart:  not examined  Abdomen: soft, non-tender; bowel sounds normal; no masses,  no organomegaly   Vulva:  positive for 1 cm right-sided, verrucous lesion  Vagina: menstrual blood present  Cervix:  multiparous appearance  Corpus: normal size, contour, position, consistency, mobility, non-tender  Adnexa:  normal adnexa  Rectal Exam: Not performed.        Assessment:    ?Delayed post partum bleed  Plan:    Pelvic U/S to r/o retained products  Empiric antibiotics Postpone treatment of genital warts for a few months Return in a few weeks

## 2012-07-05 NOTE — Patient Instructions (Addendum)
Postpartum Hemorrhage Postpartum hemorrhage is blood loss of more than 500 mL after childbirth. This is about two cups of blood. Most bleeding happens within 24 hours after birth. This is called primary postpartum hemorrhage. Less commonly, bleeding occurs more than 24 hours after birth, which is called secondary hemorrhage. The most common cause is uterine atony. This means your uterus does not shrink (contract) properly following delivery. When this happens, the blood vessels in the uterus are not squeezed and they keep on bleeding. This is serious. SYMPTOMS  Bleeding after delivery is normal and you should expect vaginal bleeding. This means you will have bleeding coming from the birth canal for many days following childbirth. This is to be expected with both normal births and c-sections.  You are bleeding too much following your delivery if you are:  Passing large clots or pieces of tissue. This may be small pieces of placenta left after delivery.  Soaking more than one sanitary pad per hour for several hours.  Having heavy, bright-red bleeding which occurs four days or more after delivery.  Having a discharge which has a bad smell or if you begin to run an unexplained temperature over 101 F (38.3 C).  Having times of lightheadedness or fainting, feeling short of breath or having your heart beat fast with very little activity. If you are having any of these symptoms, call your caregiver right away. TREATMENT  Treatments used include medications and blood transfusions. If these treatments do not work, surgery may have to be done to remove the womb (uterus).  Sometimes bleeding occurs if portions of the afterbirth (placenta) are left behind in the uterus following delivery. If this happens, often a curettage or scraping of the inside of the uterus must be done. This usually stops the bleeding.  If bleeding is due to clotting or bleeding problems, which are not related to the pregnancy,  other treatments may be needed. HOME CARE INSTRUCTIONS   Your caregiver may order bed rest (getting up to the bathroom only), or may allow you to continue light activity.  Keep track of the number of pads you use each day and how soaked (saturated) they are. Write this down.  Do no use tampons. Do not douche or have sexual intercourse until approved by your physician.  Rest and drink extra fluids.  Get proper amounts of rest and exercise.  If you are anemic following your pregnancy, be sure to take the iron and vitamin supplements that your caregiver recommends.  A diet rich in iron, such as spinach, red meat and legumes will be helpful. SEEK IMMEDIATE MEDICAL CARE IF:  You experience severe cramps in your stomach, back or belly (abdomen).  You run an unexplained temperature of over 101 F (38.3 C) or as told by your caregiver.  You pass large clots or tissue. Save any tissue for your caregiver to look at.  Your bleeding increases or you become light-headed, weak, or faint. Document Released: 05/14/2003 Document Revised: 05/16/2011 Document Reviewed: 12/07/2007 [facility] Patient Information 2013 [place], [place]. Genital Warts Genital warts are a sexually transmitted infection. They may appear as small bumps on the tissues of the genital area. CAUSES  Genital warts are caused by a virus called human papillomavirus (HPV). HPV is the most common sexually transmitted disease (STD) and infection of the sex organs. This infection is spread by having unprotected sex with an infected person. It can be spread by vaginal, anal, and oral sex. Many people do not know they are infected.  They may be infected for years without problems. However, even if they do not have problems, they can unknowingly pass the infection to their sexual partners. SYMPTOMS   Itching and irritation in the genital area.  Warts that bleed.  Painful sexual intercourse. DIAGNOSIS  Warts are usually recognized with  the naked eye on the vagina, vulva, perineum, anus, and rectum. Certain tests can also diagnose genital warts, such as:  A Pap test.  A tissue sample (biopsy) exam.  Colposcopy. A magnifying tool is used to examine the vagina and cervix. The HPV cells will change color when certain solutions are used. TREATMENT  Warts can be removed by:  Applying certain chemicals, such as cantharidin or podophyllin.  Liquid nitrogen freezing (cryotherapy).  Immunotherapy with candida or trichophyton injections.  Laser treatment.  Burning with an electrified probe (electrocautery).  Interferon injections.  Surgery. PREVENTION  HPV vaccination can help prevent HPV infections that cause genital warts and that cause cancer of the cervix. It is recommended that the vaccination be given to people between the ages 47 to 12 years old. The vaccine might not work as well or might not work at all if you already have HPV. It should not be given to pregnant women. HOME CARE INSTRUCTIONS   It is important to follow your caregiver's instructions. The warts will not go away without treatment. Repeat treatments are often needed to get rid of warts. Even after it appears that the warts are gone, the normal tissue underneath often remains infected.  Do not try to treat genital warts with medicine used to treat hand warts. This type of medicine is strong and can burn the skin in the genital area, causing more damage.  Tell your past and current sexual partner(s) that you have genital warts. They may be infected also and need treatment.  Avoid sexual contact while being treated.  Do not touch or scratch the warts. The infection may spread to other parts of your body.  Women with genital warts should have a cervical cancer check (Pap test) at least once a year. This type of cancer is slow-growing and can be cured if found early. Chances of developing cervical cancer are increased with HPV.  Inform your obstetrician  about your warts in the event of pregnancy. This virus can be passed to the baby's respiratory tract. Discuss this with your caregiver.  Use a condom during sexual intercourse. Following treatment, the use of condoms will help prevent reinfection.  Ask your caregiver about using over-the-counter anti-itch creams. SEEK MEDICAL CARE IF:   Your treated skin becomes red, swollen, or painful.  You have a fever.  You feel generally ill.  You feel little lumps in and around your genital area.  You are bleeding or have painful sexual intercourse. MAKE SURE YOU:   Understand these instructions.  Will watch your condition.  Will get help right away if you are not doing well or get worse. Document Released: 02/19/2000 Document Revised: 05/16/2011 Document Reviewed: 08/30/2010 [facility] Patient Information 2013 [place], [place].

## 2012-07-06 ENCOUNTER — Encounter: Payer: Self-pay | Admitting: Obstetrics & Gynecology

## 2012-07-06 LAB — WET PREP BY MOLECULAR PROBE
Candida species: NEGATIVE
Gardnerella vaginalis: NEGATIVE
Trichomonas vaginosis: POSITIVE — AB

## 2012-07-07 LAB — PAP IG, CT-NG, RFX HPV ASCU
Chlamydia Probe Amp: NEGATIVE
GC Probe Amp: NEGATIVE

## 2012-07-08 ENCOUNTER — Encounter: Payer: Self-pay | Admitting: Obstetrics & Gynecology

## 2012-07-08 DIAGNOSIS — A5901 Trichomonal vulvovaginitis: Secondary | ICD-10-CM | POA: Insufficient documentation

## 2012-07-10 ENCOUNTER — Other Ambulatory Visit: Payer: Medicaid Other

## 2012-07-13 ENCOUNTER — Ambulatory Visit (INDEPENDENT_AMBULATORY_CARE_PROVIDER_SITE_OTHER): Payer: Medicaid Other | Admitting: Obstetrics & Gynecology

## 2012-07-13 ENCOUNTER — Encounter: Payer: Self-pay | Admitting: Obstetrics & Gynecology

## 2012-07-13 VITALS — BP 119/82 | HR 70 | Temp 98.0°F | Ht 60.0 in | Wt 147.0 lb

## 2012-07-13 DIAGNOSIS — A5901 Trichomonal vulvovaginitis: Secondary | ICD-10-CM

## 2012-07-13 NOTE — Progress Notes (Signed)
Subjective:     [name] is a 27 y.o. female here to f/u recent treatment of trichomoniasis.  C/O "bump" in the right groin; incisional tenderness.     Obstetric History OB History   Grav Para Term Preterm Abortions TAB SAB Ect Mult Living   4 1 1  3 1 2   1      # Outc Date GA Lbr Len/2nd Wgt Sex Del Anes PTL Lv   1 TRM 3/14 [redacted]w[redacted]d 00:00 9lb14.2oz(4.485kg) M LTCS Spinal  Yes   2 SAB            3 SAB            4 TAB                The following portions of the patient's history were reviewed and updated as appropriate: allergies, current medications, past family history, past medical history, past social history, past surgical history and problem list.  Review of Systems Pertinent items are noted in HPI.    Objective:    Abd: incision well healed Groins with bilateral lymphadenopathy  Assessment:   Inguinal lymphadenopathy in the setting of recent STI  Plan:     F/U prn or in a few months

## 2012-07-13 NOTE — Patient Instructions (Signed)
Vaginitis  Vaginitis is an infection. It causes soreness, swelling, and redness (inflammation) of the vagina. Many of these infections are sexually transmitted diseases (STDs). Having unprotected sex can cause further problems and complications such as:   Chronic pelvic pain.   Infertility.   Unwanted pregnancy.   Abortion.   Tubal pregnancy.   Infection passed on to the newborn.   Cancer.  CAUSES    Monilia. This is a yeast or fungus infection, not an STD.   Bacterial vaginosis. The normal balance of bacteria in the vagina is disrupted and is replaced by an overgrowth of certain bacteria.   Gonorrhea, chlamydia. These are bacterial infections that are STDs.   Vaginal sponges, diaphragms, and intrauterine devices.   Trichomoniasis. This is a STD infection caused by a parasite.   Viruses like herpes and human papillomavirus. Both are STDs.   Pregnancy.   Immunosuppression. This occurs with certain conditions such as HIV infection or cancer.   Using bubble bath.   Taking certain antibiotic medicines.   Sporadic recurrence can occur if you become sick.   Diabetes.   Steroids.   Allergic reaction. If you have an allergy to:   Douches.   Soaps.   Spermicides.   Condoms.   Scented tampons or vaginal sprays.  SYMPTOMS    Abnormal vaginal discharge.   Itching of the vagina.   Pain in the vagina.   Swelling of the vagina.  In some cases, there are no symptoms.  TREATMENT   Treatment will vary depending on the type of infection.   Bacteria or trichomonas are usually treated with oral antibiotics and sometimes vaginal cream or suppositories.   Monilia vaginitis is usually treated with vaginal creams, suppositories, or oral antifungal pills.   Viral vaginitis has no cure. However, the symptoms of herpes (a viral vaginitis) can be treated to relieve the discomfort. Human papillomavirus has no symptoms. However, there are treatments for the diseases caused by human papillomavirus.   With allergic  vaginitis, you need to stop using the product that is causing the problem. Vaginal creams can be used to treat the symptoms.   When treating an STD, the sex partner should also be treated.  HOME CARE INSTRUCTIONS    Take all the medicines as directed by your caregiver.   Do not use scented tampons, soaps, or vaginal sprays.   Do not douche.   Tell your sex partner if you have a vaginal infection or an STD.   Do not have sexual intercourse until you have treated the vaginitis.   Practice safe sex by using condoms.  SEEK MEDICAL CARE IF:    You have abdominal pain.   Your symptoms get worse during treatment.  Document Released: 12/19/2006 Document Revised: 05/16/2011 Document Reviewed: 08/14/2008  [employer] Patient Information 2013 [employer].

## 2012-08-01 ENCOUNTER — Other Ambulatory Visit: Payer: Self-pay | Admitting: *Deleted

## 2012-08-01 ENCOUNTER — Other Ambulatory Visit: Payer: Medicaid Other

## 2012-08-01 DIAGNOSIS — O9089 Other complications of the puerperium, not elsewhere classified: Secondary | ICD-10-CM

## 2012-08-22 ENCOUNTER — Emergency Department (HOSPITAL_COMMUNITY): Payer: Self-pay

## 2012-08-22 ENCOUNTER — Encounter (HOSPITAL_COMMUNITY): Payer: Self-pay | Admitting: Emergency Medicine

## 2012-08-22 ENCOUNTER — Emergency Department (HOSPITAL_COMMUNITY)
Admission: EM | Admit: 2012-08-22 | Discharge: 2012-08-22 | Disposition: A | Discharge status: Home / Self Care | Payer: Self-pay | Attending: Emergency Medicine | Admitting: Emergency Medicine

## 2012-08-22 DIAGNOSIS — R059 Cough, unspecified: Secondary | ICD-10-CM | POA: Insufficient documentation

## 2012-08-22 DIAGNOSIS — Z79899 Other long term (current) drug therapy: Secondary | ICD-10-CM | POA: Insufficient documentation

## 2012-08-22 DIAGNOSIS — E119 Type 2 diabetes mellitus without complications: Secondary | ICD-10-CM | POA: Insufficient documentation

## 2012-08-22 DIAGNOSIS — R05 Cough: Secondary | ICD-10-CM | POA: Insufficient documentation

## 2012-08-22 DIAGNOSIS — Z3202 Encounter for pregnancy test, result negative: Secondary | ICD-10-CM | POA: Insufficient documentation

## 2012-08-22 DIAGNOSIS — J45909 Unspecified asthma, uncomplicated: Secondary | ICD-10-CM | POA: Insufficient documentation

## 2012-08-22 DIAGNOSIS — Z8719 Personal history of other diseases of the digestive system: Secondary | ICD-10-CM | POA: Insufficient documentation

## 2012-08-22 DIAGNOSIS — Z87448 Personal history of other diseases of urinary system: Secondary | ICD-10-CM | POA: Insufficient documentation

## 2012-08-22 DIAGNOSIS — F17200 Nicotine dependence, unspecified, uncomplicated: Secondary | ICD-10-CM | POA: Insufficient documentation

## 2012-08-22 DIAGNOSIS — R109 Unspecified abdominal pain: Secondary | ICD-10-CM | POA: Insufficient documentation

## 2012-08-22 LAB — HEPATIC FUNCTION PANEL
ALT: 11 U/L (ref 0–35)
Alkaline Phosphatase: 95 U/L (ref 39–117)
Bilirubin, Direct: <0.1 mg/dL (ref 0.0–0.3)

## 2012-08-22 LAB — URINALYSIS, ROUTINE W REFLEX MICROSCOPIC
Glucose, UA: NEGATIVE mg/dL
Ketones, ur: NEGATIVE mg/dL
Protein, ur: NEGATIVE mg/dL

## 2012-08-22 LAB — CBC WITH DIFFERENTIAL/PLATELET
Eosinophils Absolute: 0.2 10*3/uL (ref 0.0–0.7)
Eosinophils Relative: 2 % (ref 0–5)
Lymphs Abs: 1.6 10*3/uL (ref 0.7–4.0)
MCH: 22.3 pg — ABNORMAL LOW (ref 26.0–34.0)
MCV: 65.1 fL — ABNORMAL LOW (ref 78.0–100.0)
Monocytes Relative: 11 % (ref 3–12)
Platelets: 501 10*3/uL — ABNORMAL HIGH (ref 150–400)
RBC: 5.25 MIL/uL — ABNORMAL HIGH (ref 3.87–5.11)

## 2012-08-22 LAB — BASIC METABOLIC PANEL
BUN: 5 mg/dL — ABNORMAL LOW (ref 6–23)
CO2: 25 mEq/L (ref 19–32)
Chloride: 104 mEq/L (ref 96–112)
Creatinine, Ser: 0.64 mg/dL (ref 0.50–1.10)

## 2012-08-22 LAB — URINE MICROSCOPIC-ADD ON

## 2012-08-22 MED ORDER — IOHEXOL 300 MG/ML  SOLN
50.0000 mL | Freq: Once | INTRAMUSCULAR | Status: AC | PRN
  Administered 2012-08-22: 50 mL via ORAL

## 2012-08-22 MED ORDER — FAMOTIDINE IN NACL 20-0.9 MG/50ML-% IV SOLN
20.0000 mg | Freq: Once | INTRAVENOUS | Status: AC
  Administered 2012-08-22: 20 mg via INTRAVENOUS
  Filled 2012-08-22: qty 50

## 2012-08-22 MED ORDER — IBUPROFEN 400 MG PO TABS: 400.0000 mg | ORAL_TABLET | Freq: Four times a day (QID) | ORAL | Status: DC | PRN

## 2012-08-22 MED ORDER — MORPHINE SULFATE 4 MG/ML IJ SOLN
4.0000 mg | Freq: Once | INTRAMUSCULAR | Status: AC
  Administered 2012-08-22: 4 mg via INTRAVENOUS
  Filled 2012-08-22: qty 1

## 2012-08-22 MED ORDER — ALBUTEROL SULFATE (5 MG/ML) 0.5% IN NEBU
5.0000 mg | INHALATION_SOLUTION | Freq: Once | RESPIRATORY_TRACT | Status: AC
  Administered 2012-08-22: 5 mg via RESPIRATORY_TRACT
  Filled 2012-08-22: qty 1

## 2012-08-22 MED ORDER — HYDROCODONE-ACETAMINOPHEN 5-325 MG PO TABS: 1.0000 | ORAL_TABLET | Freq: Four times a day (QID) | ORAL | Status: DC | PRN

## 2012-08-22 MED ORDER — ONDANSETRON 8 MG PO TBDP: 8.0000 mg | ORAL_TABLET | Freq: Three times a day (TID) | ORAL | Status: DC | PRN

## 2012-08-22 MED ORDER — IPRATROPIUM BROMIDE 0.02 % IN SOLN
0.5000 mg | Freq: Once | RESPIRATORY_TRACT | Status: AC
Start: 2012-08-22 — End: 2012-08-22
  Administered 2012-08-22: 0.5 mg via RESPIRATORY_TRACT
  Filled 2012-08-22: qty 2.5

## 2012-08-22 MED ORDER — IOHEXOL 300 MG/ML  SOLN
100.0000 mL | Freq: Once | INTRAMUSCULAR | Status: AC | PRN
  Administered 2012-08-22: 100 mL via INTRAVENOUS

## 2012-08-22 MED ORDER — SODIUM CHLORIDE 0.9 % IV BOLUS (SEPSIS)
1000.0000 mL | Freq: Once | INTRAVENOUS | Status: AC
  Administered 2012-08-22: 1000 mL via INTRAVENOUS

## 2012-08-22 MED ORDER — ONDANSETRON HCL 4 MG/2ML IJ SOLN
4.0000 mg | Freq: Once | INTRAMUSCULAR | Status: AC
  Administered 2012-08-22: 4 mg via INTRAVENOUS
  Filled 2012-08-22: qty 2

## 2012-08-22 MED ORDER — GI COCKTAIL ~~LOC~~
30.0000 mL | Freq: Once | ORAL | Status: AC
  Administered 2012-08-22: 30 mL via ORAL
  Filled 2012-08-22: qty 30

## 2012-08-22 NOTE — ED Provider Notes (Signed)
History     CSN: [id number]  Arrival date & time 08/22/12  1114   First MD Initiated Contact with Patient 08/22/12 1125      Chief Complaint  Patient presents with  . Abdominal Pain  . Cough    (Consider location/radiation/quality/duration/timing/severity/associated sxs/prior treatment) HPI Comments: Pt comes in with cc of abd pain. 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 Comes in today with severe abd pain. The pain is epigastric, radiating to the back. There is no n/v/f/c. Pt has some nausea, no emesis, diarrhea. No numbness, tingling.  Patient is a 27 y.o. female presenting with abdominal pain and cough. The history is provided by the patient.  Abdominal Pain Associated symptoms include abdominal pain. Pertinent negatives include no chest pain and no shortness of breath.  Cough Associated symptoms: no chest pain, no shortness of breath and no wheezing     Past Medical History  Diagnosis Date  . Asthma   . Pelvic inflammatory disease   . Pancreatitis   . Polycystic ovary syndrome   . Pancreatic cyst   . 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 Splenectomy, total  2012  . Pancreas surgery  2012    s/p partial pancreatectomy  . Cesarean section N/A 05/23/2012    Procedure: CESAREAN SECTION;  Surgeon: Antionette Char, MD;  Location: [facility];  Service: Obstetrics;  Laterality: N/A;  primary    Family History  Problem Relation Age of Onset  . Diabetes Mother   . Colon cancer Neg Hx     History  Substance Use Topics  . Smoking status: Current Every Day Smoker -- 0.20 packs/day for 13 years    Types: Cigarettes  . Smokeless tobacco: Never Used  . Alcohol Use: 0.0 oz/week     Comment: "weekends"    OB History   Grav Para Term Preterm Abortions TAB SAB Ect Mult Living   4 1 1  3 1 2   1       Review of Systems  Constitutional:  Negative for activity change.  HENT: Negative for facial swelling and neck pain.   Respiratory: Positive for cough. Negative for shortness of breath and wheezing.   Cardiovascular: Negative for chest pain.  Gastrointestinal: Positive for abdominal pain. Negative for nausea, vomiting, diarrhea, constipation, blood in stool and abdominal distention.  Genitourinary: Negative for hematuria and difficulty urinating.  Skin: Negative for color change.  Neurological: Negative for speech difficulty.  Hematological: Does not bruise/bleed easily.  Psychiatric/Behavioral: Negative for confusion.    Allergies  Dilaudid  Home Medications   Current Outpatient Rx  Name  Route  Sig  Dispense  Refill  . acetaminophen (TYLENOL) 500 MG tablet   Oral   Take 1,000 mg by mouth every 6 (six) hours as needed for pain.         Marland Kitchen albuterol (PROAIR HFA) 108 (90 BASE) MCG/ACT inhaler   Inhalation   Inhale 2 puffs into the lungs every 6 (six) hours as needed for wheezing or shortness of breath.          . glyBURIDE (DIABETA) 2.5 MG tablet   Oral   Take 2.5 mg by mouth daily with breakfast.          . Norethindrone-Ethinyl Estradiol-Fe Biphas (LO LOESTRIN FE) 1 MG-10 MCG / 10 MCG tablet   Oral   Take 1 tablet by mouth daily.  1 Package   11     BP 131/97  Pulse 94  Temp(Src) 98.7 F (37.1 C) (Oral)  Resp 20  SpO2 100%  LMP 08/22/2012  Breastfeeding? No  Physical Exam  Constitutional: She is oriented to person, place, and time. She appears well-developed and well-nourished.  HENT:  Head: Normocephalic and atraumatic.  Eyes: EOM are normal. Pupils are equal, round, and reactive to light.  Neck: Neck supple.  Cardiovascular: Normal rate, regular rhythm and normal heart sounds.   No murmur heard. Pulmonary/Chest: Effort normal. No respiratory distress.  Abdominal: Soft. Normal appearance and bowel sounds are normal. She exhibits no distension, no fluid wave, no ascites and no mass. There  is tenderness in the epigastric area. There is no rebound and no guarding.  Neurological: She is alert and oriented to person, place, and time.  Skin: Skin is warm and dry.    ED Course  Procedures (including critical care time)  Labs Reviewed  CBC WITH DIFFERENTIAL  BASIC METABOLIC PANEL  HEPATIC FUNCTION PANEL  LIPASE, BLOOD  PREGNANCY, URINE  URINALYSIS, ROUTINE W REFLEX MICROSCOPIC   No results found.   No diagnosis found.    MDM  DDx includes: Pancreatitis Hepatobiliary pathology including cholecystitis Gastritis/PUD SBO ACS syndrome Aortic Dissection   Pt comes in with cc of abd pain. Pt smoked, no other cardiac risk factors, no vascular risk factors. Pt has hx of pancreatic cyst - and the pain is consistent with pancreatitis. She had the cyst removed - but repeat inflammation possible.  Will get basic labs, if the pain not better - will get CT.  4:12 PM Pt's pain didn't improve, so we did get CT - which is negative. PCP f/u requested.  Derwood Kaplan, MD 08/22/12 [phone]

## 2012-08-22 NOTE — ED Notes (Addendum)
Pt 5/10 centralized upper abdominal pain that also radiates to the middle of the back. Pt states she has had her spleen and part of her pancreas removed. Pt describes the pain as similar to prior pancreatic attacks. Pt also reports having a productive cough with thick green mucus. Pt denies N/V/D.

## 2012-09-17 ENCOUNTER — Encounter (HOSPITAL_COMMUNITY): Payer: Self-pay | Admitting: Family Medicine

## 2012-09-17 ENCOUNTER — Emergency Department (HOSPITAL_COMMUNITY)
Admission: EM | Admit: 2012-09-17 | Discharge: 2012-09-17 | Disposition: A | Discharge status: Home / Self Care | Payer: Medicaid Other | Attending: Emergency Medicine | Admitting: Emergency Medicine

## 2012-09-17 DIAGNOSIS — F17200 Nicotine dependence, unspecified, uncomplicated: Secondary | ICD-10-CM | POA: Insufficient documentation

## 2012-09-17 DIAGNOSIS — Z862 Personal history of diseases of the blood and blood-forming organs and certain disorders involving the immune mechanism: Secondary | ICD-10-CM | POA: Insufficient documentation

## 2012-09-17 DIAGNOSIS — Z79899 Other long term (current) drug therapy: Secondary | ICD-10-CM | POA: Insufficient documentation

## 2012-09-17 DIAGNOSIS — K921 Melena: Secondary | ICD-10-CM | POA: Insufficient documentation

## 2012-09-17 DIAGNOSIS — Z8719 Personal history of other diseases of the digestive system: Secondary | ICD-10-CM | POA: Insufficient documentation

## 2012-09-17 DIAGNOSIS — E119 Type 2 diabetes mellitus without complications: Secondary | ICD-10-CM | POA: Insufficient documentation

## 2012-09-17 DIAGNOSIS — Z8742 Personal history of other diseases of the female genital tract: Secondary | ICD-10-CM | POA: Insufficient documentation

## 2012-09-17 DIAGNOSIS — K6289 Other specified diseases of anus and rectum: Secondary | ICD-10-CM

## 2012-09-17 DIAGNOSIS — J45909 Unspecified asthma, uncomplicated: Secondary | ICD-10-CM | POA: Insufficient documentation

## 2012-09-17 DIAGNOSIS — Z8639 Personal history of other endocrine, nutritional and metabolic disease: Secondary | ICD-10-CM | POA: Insufficient documentation

## 2012-09-17 DIAGNOSIS — K625 Hemorrhage of anus and rectum: Secondary | ICD-10-CM | POA: Insufficient documentation

## 2012-09-17 DIAGNOSIS — K649 Unspecified hemorrhoids: Secondary | ICD-10-CM | POA: Insufficient documentation

## 2012-09-17 LAB — OCCULT BLOOD, POC DEVICE: Fecal Occult Bld: POSITIVE — AB

## 2012-09-17 MED ORDER — POLYETHYLENE GLYCOL 3350 17 GM/SCOOP PO POWD: 17.0000 g | Freq: Every day | ORAL | Status: DC

## 2012-09-17 MED ORDER — HYDROCORTISONE 2.5 % RE CREA: TOPICAL_CREAM | RECTAL | Status: DC

## 2012-09-17 NOTE — ED Notes (Signed)
Per pt sts went to have a BM this morning and severe pain in rectal area. sts hurts on the inside and out. sts has been tender down there since Friday. sts a little blood

## 2012-09-17 NOTE — ED Provider Notes (Signed)
History    CSN: [id number] Arrival date & time 09/17/12  4098  First MD Initiated Contact with Patient 09/17/12 [phone]     No chief complaint on file.  (Consider location/radiation/quality/duration/timing/severity/associated sxs/prior Treatment) HPI  27 year old female with history of appendicitis status post partial pancreatectomy, pelvic inflammatory disease, non-complicated diabetes presents complaining of BRBPR and rectal pain. Patient states for the past 5 days she has been having pain to the rectal area. Describe pain as a soreness and sharp sensation worsening with sitting down, and was having bowel movement. This morning while having a bowel movement she notice increasing rectal pain. She noticed trace of bright red blood on her toilet tissue paper only.  Otherwise patient denies fever, chills, headache, lightheadedness, dizziness, abdominal pain, back pain, dysuria, hematuria, or rash. No prior history of hemorrhoids. Denies any complaints of constipation. Denies any recent trauma  Past Medical History  Diagnosis Date  . Asthma   . Pelvic inflammatory disease   . Pancreatitis   . Polycystic ovary syndrome   . Pancreatic cyst   . 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 Splenectomy, total  2012  . Pancreas surgery  2012    s/p partial pancreatectomy  . Cesarean section N/A 05/23/2012    Procedure: CESAREAN SECTION;  Surgeon: Antionette Char, MD;  Location: [facility];  Service: Obstetrics;  Laterality: N/A;  primary   Family History  Problem Relation Age of Onset  . Diabetes Mother   . Colon cancer Neg Hx    History  Substance Use Topics  . Smoking status: Current Every Day Smoker -- 0.20 packs/day for 13 years    Types: Cigarettes  . Smokeless tobacco: Never Used  . Alcohol Use: 0.0 oz/week     Comment: "weekends"   OB History   Grav Para Term Preterm Abortions TAB SAB  Ect Mult Living   4 1 1  3 1 2   1      Review of Systems  Constitutional: Negative for fever.  Gastrointestinal: Positive for blood in stool and rectal pain.  Skin: Negative for wound.  Neurological: Negative for numbness.  All other systems reviewed and are negative.    Allergies  Dilaudid  Home Medications   Current Outpatient Rx  Name  Route  Sig  Dispense  Refill  . acetaminophen (TYLENOL) 500 MG tablet   Oral   Take 1,000 mg by mouth every 6 (six) hours as needed for pain.         Marland Kitchen albuterol (PROAIR HFA) 108 (90 BASE) MCG/ACT inhaler   Inhalation   Inhale 2 puffs into the lungs every 6 (six) hours as needed for wheezing or shortness of breath.          . glyBURIDE (DIABETA) 2.5 MG tablet   Oral   Take 2.5 mg by mouth daily with breakfast.          . HYDROcodone-acetaminophen (NORCO/VICODIN) 5-325 MG per tablet   Oral   Take 1 tablet by mouth every 6 (six) hours as needed for pain.   10 tablet   0   . ibuprofen (ADVIL,MOTRIN) 400 MG tablet   Oral   Take 1 tablet (400 mg total) by mouth every 6 (six) hours as needed for pain.   30 tablet   0   . Norethindrone-Ethinyl Estradiol-Fe Biphas (LO LOESTRIN FE) 1 MG-10 MCG / 10 MCG tablet   Oral  Take 1 tablet by mouth daily.   1 Package   11   . ondansetron (ZOFRAN ODT) 8 MG disintegrating tablet   Oral   Take 1 tablet (8 mg total) by mouth every 8 (eight) hours as needed for nausea.   10 tablet   0    BP 142/96  Pulse 83  Temp(Src) 97.6 F (36.4 C) (Oral)  Resp 16  SpO2 98%  LMP 08/16/2012 Physical Exam  Nursing note and vitals reviewed. Constitutional: She appears well-developed and well-nourished. No distress.  Awake, alert, nontoxic appearance  HENT:  Head: Atraumatic.  Eyes: Conjunctivae are normal. Right eye exhibits no discharge. Left eye exhibits no discharge.  Neck: Neck supple.  Cardiovascular: Normal rate and regular rhythm.   Pulmonary/Chest: Effort normal. No respiratory  distress. She exhibits no tenderness.  Abdominal: Soft. There is no tenderness. There is no rebound.  Genitourinary:  Chaperone present:  Rectal exam with evidence of both external and internal hemorrhoids, rectal exam painful, normal color stool but hemoccult+. No mass, no anal fissure, no obvious abscess.    Musculoskeletal: She exhibits no tenderness.  ROM appears intact, no obvious focal weakness  Neurological: She is alert.  Mental status and motor strength appears intact  Skin: No rash noted.  Psychiatric: She has a normal mood and affect.    ED Course  Procedures (including critical care time)  9:28 AM Patient complaint of rectal pain and trace of blood 1 white. On exam, patient does have evidence of both external and internal hemorrhoid, rectal exam was painful for patient. However patient is afebrile with stable normal vital sign, abdominal pain, low suspicion for concerning GI bleed. Will give patient Anusol, care instruction, recommend exercise and avoid prolonged sitting or prolonged straining. Recommend high fiber diet to decrease risk of constipation. Patient also has a GI specialist, Dr. Arlyce Dice, in which she agrees to followup if needed. Patient otherwise stable for discharge.  Labs Reviewed  OCCULT BLOOD, POC DEVICE - Abnormal; Notable for the following:    Fecal Occult Bld POSITIVE (*)    All other components within normal limits   No results found. 1. Bleeding hemorrhoid   2. Anal or rectal pain     MDM  BP 142/96  Pulse 83  Temp(Src) 97.6 F (36.4 C) (Oral)  Resp 16  SpO2 98%  LMP 09/08/2012    Fayrene Helper, PA-C 09/17/12 0932

## 2012-09-18 NOTE — ED Provider Notes (Signed)
Medical screening examination/treatment/procedure(s) were performed by non-physician practitioner and as supervising physician I was immediately available for consultation/collaboration.  Bertrand Vowels T Khamil Lamica, MD 09/18/12 0704 

## 2012-10-18 ENCOUNTER — Ambulatory Visit: Payer: Medicaid Other | Admitting: Obstetrics & Gynecology

## 2013-06-12 ENCOUNTER — Encounter (HOSPITAL_COMMUNITY): Payer: Self-pay | Admitting: *Deleted

## 2013-06-12 ENCOUNTER — Inpatient Hospital Stay (HOSPITAL_COMMUNITY)
Admission: AD | Admit: 2013-06-12 | Discharge: 2013-06-12 | Disposition: A | Discharge status: Home / Self Care | Payer: Self-pay | Source: Ambulatory Visit | Attending: Obstetrics | Admitting: Obstetrics

## 2013-06-12 DIAGNOSIS — J45909 Unspecified asthma, uncomplicated: Secondary | ICD-10-CM | POA: Insufficient documentation

## 2013-06-12 DIAGNOSIS — Z833 Family history of diabetes mellitus: Secondary | ICD-10-CM | POA: Insufficient documentation

## 2013-06-12 DIAGNOSIS — Z3202 Encounter for pregnancy test, result negative: Secondary | ICD-10-CM

## 2013-06-12 DIAGNOSIS — F17200 Nicotine dependence, unspecified, uncomplicated: Secondary | ICD-10-CM | POA: Insufficient documentation

## 2013-06-12 DIAGNOSIS — N925 Other specified irregular menstruation: Secondary | ICD-10-CM

## 2013-06-12 DIAGNOSIS — E119 Type 2 diabetes mellitus without complications: Secondary | ICD-10-CM | POA: Insufficient documentation

## 2013-06-12 DIAGNOSIS — N898 Other specified noninflammatory disorders of vagina: Secondary | ICD-10-CM | POA: Insufficient documentation

## 2013-06-12 DIAGNOSIS — N939 Abnormal uterine and vaginal bleeding, unspecified: Secondary | ICD-10-CM

## 2013-06-12 DIAGNOSIS — N949 Unspecified condition associated with female genital organs and menstrual cycle: Secondary | ICD-10-CM

## 2013-06-12 LAB — CBC
HEMATOCRIT: 37.1 % (ref 36.0–46.0)
HEMOGLOBIN: 12.9 g/dL (ref 12.0–15.0)
MCH: 24.3 pg — ABNORMAL LOW (ref 26.0–34.0)
MCHC: 34.8 g/dL (ref 30.0–36.0)
MCV: 69.9 fL — AB (ref 78.0–100.0)
Platelets: 451 10*3/uL — ABNORMAL HIGH (ref 150–400)
RBC: 5.31 MIL/uL — ABNORMAL HIGH (ref 3.87–5.11)
RDW: 15.1 % (ref 11.5–15.5)
WBC: 5.7 10*3/uL (ref 4.0–10.5)

## 2013-06-12 LAB — WET PREP, GENITAL
TRICH WET PREP: NONE SEEN
Yeast Wet Prep HPF POC: NONE SEEN

## 2013-06-12 LAB — HCG, QUANTITATIVE, PREGNANCY

## 2013-06-12 NOTE — Discharge Instructions (Signed)
Pregnancy Tests HOW DO PREGNANCY TESTS WORK? All pregnancy tests look for a special hormone in the urine or blood that is only present in pregnant women. This hormone, human chorionic gonadotropin (hCG), is also called the pregnancy hormone.  WHAT IS THE DIFFERENCE BETWEEN A URINE AND A BLOOD PREGNANCY TEST? IS ONE BETTER THAN THE OTHER? There are two types of pregnancy tests.  Blood tests.  Urine tests. Both tests look for the presence of hCG, the pregnancy hormone. Many women use a urine test or home pregnancy test (HPT) to find out if they are pregnant. HPTs are cheap, easy to use, can be done at home, and are private. When a woman has a positive result on an HPT, she needs to see her caregiver right away. The caregiver can confirm a positive HPT result with another urine test, a blood test, ultrasound, and a pelvic exam.  There are two types of blood tests you can get from a caregiver.   A quantitative blood test (or the beta hCG test). This test measures the exact amount of hCG in the blood. This means it can pick up very small amounts of hCG, making it a very accurate test.  A qualitative hCG blood test. This test gives a simple yes or no answer to whether you are pregnant. This test is more like a urine test in terms of its accuracy. Blood tests can pick up hCG earlier in a pregnancy than urine tests can. Blood tests can tell if you are pregnant about 6 to 8 days after you release an egg from an ovary (ovulate). Urine tests can determine pregnancy about 2 weeks after ovulation.  HOW IS A HOME PREGNANCY TEST DONE?  There are many types of home pregnancy tests or HPTs that can be bought over-the-counter at drug or discount stores.   Some involve collecting your urine in a cup and dipping a stick into the urine or putting some of the urine into a special container with an eyedropper.  Others are done by placing a stick into your urine stream.  Tests vary in how long you need to wait for  the stick or container to turn a certain color or have a symbol on it (like a plus or a minus).  All tests come with written instructions. Most tests also have toll-free phone numbers to call if you have any questions about how to do the test or read the results. HOW ACCURATE ARE HOME PREGNANCY TESTS?  HPTs are very accurate. Most brands of HPTs say they are 97% to 99% accurate when taken 1 week after missing your menstrual period, but this can vary with actual use. Each brand varies in how sensitive it is in picking up the pregnancy hormone hCG. If a test is not done correctly, it will be less accurate. Always check the package to make sure it is not past its expiration date. If it is, it will not be accurate. Most brands of HPTs tell users to do the test again in a few days, no matter what the results.  If you use an HPT too early in your pregnancy, you may not have enough of the pregnancy hormone hCG in your urine to have a positive test result. Most HPTs will be accurate if you test yourself around the time your period is due (about 2 weeks after you ovulate). You can get a negative test result if you are not pregnant or if you ovulated later than you thought you did.  You may also have problems with the pregnancy, which affects the amount of hCG you have in your urine. If your HPT is negative, test yourself again within a few days to 1 week. If you keep getting a negative result and think you are pregnant, talk with your caregiver right away about getting a blood pregnancy test.  FALSE POSITIVE PREGNANCY TEST A false positive HPT can happen if there is blood or protein present in your urine. A false positive can also happen if you were recently pregnant or if you take a pregnancy test too soon after taking fertility drug that contains hCG. Also, some prescription medicines such as water pills (diuretics), tranquilizers, seizure medicines, psychiatric medicines, and allergy and nausea medicines  (promethazine) give false positive readings. FALSE NEGATIVE PREGNANCY TEST  A false negative HPT can happen if you do the test too early. Try to wait until you are at least 1 day late for your menstrual period.  It may happen if you wait too long to test the urine (longer than 15 minutes).  It may also happen if the urine is too diluted because you drank a lot of fluids before getting the urine sample. It is best to test the first morning urine after you get out of bed. If your menstrual period did not start after a week of a negative HPT, repeat the pregnancy test. CAN ANYTHING INTERFERE WITH HOME PREGNANCY TEST RESULTS?  Most medicines, both over-the-counter and prescription drugs, including birth control pills and antibiotics, should not affect the results of a HPT. Only those drugs that have the pregnancy hormone hCG in them can give a false positive test result. Drugs that have hCG in them may be used for treating infertility (not being able to get pregnant). Alcohol and illegal drugs do not affect HPT results, but you should not be using these substances if you are trying to get pregnant. If you have a positive pregnancy test, call your caregiver to make an appointment to begin prenatal care. Document Released: 02/24/2003 Document Revised: 05/16/2011 Document Reviewed: 05/07/2010 [facility] Patient Information 2014 [place], [place].  Human Chorionic Gonadotropin (hCG) This is a test to confirm and monitor pregnancy or to diagnose trophoblastic disease or germ cell tumors. As early as 10 days after a missed menstrual period (some methods can detect hCG even earlier, at one week after conception) or if your caregiver thinks that your symptoms suggest ectopic pregnancy, a failing pregnancy, trophoblastic disease, or germ cell tumors. hCG is a protein produced in the placenta of a pregnant woman. A pregnancy test is a specific blood or urine test that can detect hCG and confirm pregnancy. This hormone  is able to be detected 10 days after a missed menstrual period, the time period when the fertilized egg is implanted in the woman's uterus. With some methods, hCG can be detected even earlier, at one week after conception.  During the early weeks of pregnancy, hCG is important in maintaining function of the corpus luteum (the mass of cells that forms from a mature egg). Production of hCG increases steadily during the first trimester (8-10 weeks), peaking around the 10th week after the last menstrual cycle. Levels then fall slowly during the remainder of the pregnancy. hCG is no longer detectable within a few weeks after delivery. hCG is also produced by some germ cell tumors and increased levels are seen in trophoblastic disease. SAMPLE COLLECTION hCG is commonly detected in urine. The preferred specimen is a random urine sample collected first  thing in the morning. hCG can also be measured in blood drawn from a vein in the arm. NORMAL FINDINGS Qualitative:negative in non-pregnant women; positive in pregnancy Quantitative:   Gestation less than 1 week: 5-50 Whole HCG (milli-international units/mL)  Gestation of 2 weeks: 50-500 Whole HCG (milli-international units/mL)  Gestation of 3 weeks: 100-10,000 Whole HCG (milli-international units/mL)  Gestation of 4 weeks: 1,000-30,000 Whole HCG (milli-international units/mL)  Gestation of 5 weeks 3,500-115,000 Whole HCG (milli-international units/mL)  Gestation of 6-8 weeks: 12,000-270,000 Whole HCG (milli-international units/mL)  Gestation of 12 weeks: 15,000-220,000 Whole HCG (milli-international units/mL)  Males and non-pregnant females: less than 5 Whole HCG (milli-international units/mL) Beta subunit: depends on the method and test used Ranges for normal findings may vary among different laboratories and hospitals. You should always check with your doctor after having lab work or other tests done to discuss the meaning of your test results and  whether your values are considered within normal limits. MEANING OF TEST  Your caregiver will go over the test results with you and discuss the importance and meaning of your results, as well as treatment options and the need for additional tests if necessary. OBTAINING THE TEST RESULTS It is your responsibility to obtain your test results. Ask the lab or department performing the test when and how you will get your results. Document Released: 03/25/2004 Document Revised: 05/16/2011 Document Reviewed: 02/02/2008 [facility] Patient Information 2014 [place], [place].

## 2013-06-12 NOTE — MAU Provider Note (Signed)
History     CSN: [id number]  Arrival date and time: 06/12/13 6433   First Provider Initiated Contact with Patient 06/12/13 [phone]      Chief Complaint  Patient presents with  . Vaginal Bleeding   HPI  [name] is 28 y.o. female [phone] who presents with vaginal bleeding. The bleeding started last Thursday and has continued. Pt is certain that she is pregnant because she has regular menstrual cycles every month. LMP: February 20th. Pt has not taken a home pregnancy test, however she has missed her period in March and believes she is pregnant. On April 2nd she started bleeding and did not feel it was her period because it was later than normal.   OB History   Grav Para Term Preterm Abortions TAB SAB Ect Mult Living   4 1 1  3 1 2   1       Past Medical History  Diagnosis Date  . Asthma   . Pelvic inflammatory disease   . Pancreatitis   . Polycystic ovary syndrome   . Pancreatic cyst   . 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 Splenectomy, total  2012  . Pancreas surgery  2012    s/p partial pancreatectomy  . Cesarean section N/A 05/23/2012    Procedure: CESAREAN SECTION;  Surgeon: Lahoma Crocker, MD;  Location: [facility];  Service: Obstetrics;  Laterality: N/A;  primary    Family History  Problem Relation Age of Onset  . Diabetes Mother   . Colon cancer Neg Hx     History  Substance Use Topics  . Smoking status: Current Every Day Smoker -- 0.50 packs/day for 13 years    Types: Cigarettes  . Smokeless tobacco: Never Used  . Alcohol Use: 0.0 oz/week     Comment: "weekends"    Allergies:  Allergies  Allergen Reactions  . Dilaudid [Hydromorphone Hcl] Itching and Other (See Comments)    hallucinations "Felt like she was losing her mind"    Prescriptions prior to admission  Medication Sig Dispense Refill  . acetaminophen (TYLENOL) 500 MG tablet Take  1,000 mg by mouth every 6 (six) hours as needed for pain.      Marland Kitchen albuterol (PROAIR HFA) 108 (90 BASE) MCG/ACT inhaler Inhale 2 puffs into the lungs every 6 (six) hours as needed for wheezing or shortness of breath.       . hydrocortisone (ANUSOL-HC) 2.5 % rectal cream Apply rectally 2 times daily as needed for rectal pain  30 g  0  . ibuprofen (ADVIL,MOTRIN) 400 MG tablet Take 1 tablet (400 mg total) by mouth every 6 (six) hours as needed for pain.  30 tablet  0  . Norethindrone-Ethinyl Estradiol-Fe Biphas (LO LOESTRIN FE) 1 MG-10 MCG / 10 MCG tablet Take 1 tablet by mouth daily.  1 Package  11  . polyethylene glycol powder (GLYCOLAX/MIRALAX) powder Take 17 g by mouth daily.  255 g  0   Results for orders placed during the hospital encounter of 06/12/13 (from the past 48 hour(s))  HCG, QUANTITATIVE, PREGNANCY     Status: None   Collection Time    06/12/13  9:39 AM      Result Value Ref Range   hCG, Beta Chain, Quant, S <1  <5 mIU/mL   Comment:              GEST. AGE  CONC.  (mIU/mL)       <=1 WEEK        5 - 50         2 WEEKS       50 - 500         3 WEEKS       100 - 10,000         4 WEEKS     1,000 - 30,000         5 WEEKS     3,500 - 115,000       6-8 WEEKS     12,000 - 270,000        12 WEEKS     15,000 - 220,000                FEMALE AND NON-PREGNANT FEMALE:         LESS THAN 5 mIU/mL     REPEATED TO VERIFY  CBC     Status: Abnormal   Collection Time    06/12/13  9:39 AM      Result Value Ref Range   WBC 5.7  4.0 - 10.5 K/uL   RBC 5.31 (*) 3.87 - 5.11 MIL/uL   Hemoglobin 12.9  12.0 - 15.0 g/dL   HCT 37.1  36.0 - 46.0 %   MCV 69.9 (*) 78.0 - 100.0 fL   MCH 24.3 (*) 26.0 - 34.0 pg   MCHC 34.8  30.0 - 36.0 g/dL   RDW 15.1  11.5 - 15.5 %   Platelets 451 (*) 150 - 400 K/uL  WET PREP, GENITAL     Status: Abnormal   Collection Time    06/12/13 10:40 AM      Result Value Ref Range   Yeast Wet Prep HPF POC NONE SEEN  NONE SEEN   Trich, Wet Prep NONE SEEN  NONE SEEN   Clue  Cells Wet Prep HPF POC FEW (*) NONE SEEN   WBC, Wet Prep HPF POC FEW (*) NONE SEEN   Comment: MODERATE BACTERIA SEEN   Review of Systems  Constitutional: Negative for fever and chills.  Gastrointestinal: Negative for nausea, vomiting, abdominal pain, diarrhea and constipation.  Genitourinary:       No vaginal discharge. No vaginal bleeding. No dysuria.    Physical Exam   Blood pressure 124/88, pulse 87, temperature 98.2 F (36.8 C), temperature source Oral, resp. rate 18, height 5' 0.25" (1.53 m), weight 64.683 kg (142 lb 9.6 oz), last menstrual period 04/26/2013, SpO2 100.00%, not currently breastfeeding.  Physical Exam  Constitutional: She is oriented to person, place, and time. She appears well-developed and well-nourished.  Non-toxic appearance. She does not have a sickly appearance. She does not appear ill. No distress.  HENT:  Head: Normocephalic.  Eyes: Pupils are equal, round, and reactive to light.  Neck: Neck supple.  Respiratory: Effort normal.  GI: Soft. She exhibits no distension. There is no tenderness. There is no rebound and no guarding.  Genitourinary:  Speculum exam: Vagina - Small amount of dark red blood in vaginal canal, few clots, no odor Cervix - small, active bleeding from os  Bimanual exam: Cervix closed, no CMT  Uterus non tender, normal size Adnexa non tender, no masses bilaterally, + mild suprapubic tenderness  GC/Chlam, wet prep done Chaperone present for exam.   Neurological: She is alert and oriented to person, place, and time.  Skin: Skin is warm and dry. She is not diaphoretic.  Psychiatric: Her behavior is normal.  MAU Course  Procedures None  MDM UA: pt unable to void at this time Beta Hcg level ordered CBC     Assessment and Plan   A:  1. Encounter for pregnancy test with result negative; negative beta hcg level   2. Abnormal vaginal bleeding    P:  Discharge home in stable condition  Bleeding precautions discussed   Return to MAU as needed, if symptoms worsen    Darrelyn Hillock Rasch 06/12/2013, 3:07 PM

## 2013-06-12 NOTE — MAU Note (Signed)
Pt unable to void upon arrival but is currently drinking water for specimen to be collected.  Pt states she hasn't had any UPT but missed her period in March.  Pt did have one in Feb.  Pt states her cycles are normally regular.  Pt is currently having vaginal bleeding since Thursday with very bad abd cramping.  Pt states she has passed some quarter sized clots yesterday.  Pt states she passed a lemon sized clot this morning.

## 2013-06-13 LAB — GC/CHLAMYDIA PROBE AMP
CT PROBE, AMP APTIMA: NEGATIVE
GC PROBE AMP APTIMA: NEGATIVE

## 2013-06-13 NOTE — MAU Provider Note (Signed)
Attestation of Attending Supervision of Advanced Practitioner (PA/CNM/NP): Evaluation and management procedures were performed by the Advanced Practitioner under my supervision and collaboration.  I have reviewed the Advanced Practitioner's note and chart, and I agree with the management and plan.  Jacob Stinson, DO Attending Physician Faculty Practice, [facility]  

## 2013-07-15 ENCOUNTER — Emergency Department (HOSPITAL_BASED_OUTPATIENT_CLINIC_OR_DEPARTMENT_OTHER)
Admission: EM | Admit: 2013-07-15 | Discharge: 2013-07-15 | Disposition: A | Discharge status: Home / Self Care | Payer: Medicaid Other | Attending: Emergency Medicine | Admitting: Emergency Medicine

## 2013-07-15 DIAGNOSIS — J020 Streptococcal pharyngitis: Secondary | ICD-10-CM | POA: Insufficient documentation

## 2013-07-15 DIAGNOSIS — J45909 Unspecified asthma, uncomplicated: Secondary | ICD-10-CM | POA: Insufficient documentation

## 2013-07-15 DIAGNOSIS — J069 Acute upper respiratory infection, unspecified: Secondary | ICD-10-CM | POA: Insufficient documentation

## 2013-07-15 DIAGNOSIS — F17200 Nicotine dependence, unspecified, uncomplicated: Secondary | ICD-10-CM | POA: Insufficient documentation

## 2013-07-15 DIAGNOSIS — H9209 Otalgia, unspecified ear: Secondary | ICD-10-CM | POA: Insufficient documentation

## 2013-07-15 DIAGNOSIS — E282 Polycystic ovarian syndrome: Secondary | ICD-10-CM | POA: Insufficient documentation

## 2013-07-15 DIAGNOSIS — E119 Type 2 diabetes mellitus without complications: Secondary | ICD-10-CM | POA: Insufficient documentation

## 2013-07-15 DIAGNOSIS — Z8742 Personal history of other diseases of the female genital tract: Secondary | ICD-10-CM | POA: Insufficient documentation

## 2013-07-15 DIAGNOSIS — Z8719 Personal history of other diseases of the digestive system: Secondary | ICD-10-CM | POA: Insufficient documentation

## 2013-07-15 DIAGNOSIS — Z79899 Other long term (current) drug therapy: Secondary | ICD-10-CM | POA: Insufficient documentation

## 2013-07-15 LAB — RAPID STREP SCREEN (MED CTR MEBANE ONLY): STREPTOCOCCUS, GROUP A SCREEN (DIRECT): POSITIVE — AB

## 2013-07-15 MED ORDER — DEXAMETHASONE 4 MG PO TABS: 10.0000 mg | ORAL_TABLET | Freq: Once | ORAL | Status: AC

## 2013-07-15 MED ORDER — HYDROCODONE-ACETAMINOPHEN 7.5-325 MG/15ML PO SOLN: 15.0000 mL | Freq: Four times a day (QID) | ORAL | Status: DC | PRN

## 2013-07-15 MED ORDER — DEXAMETHASONE SODIUM PHOSPHATE 10 MG/ML IJ SOLN
INTRAMUSCULAR | Status: AC
  Administered 2013-07-15: 10 mg
  Filled 2013-07-15: qty 1

## 2013-07-15 MED ORDER — AMOXICILLIN 500 MG PO CAPS: 500.0000 mg | ORAL_CAPSULE | Freq: Three times a day (TID) | ORAL | Status: DC

## 2013-07-15 NOTE — ED Provider Notes (Addendum)
CSN: [id number]     Arrival date & time 07/15/13  4010 History   First MD Initiated Contact with Patient 07/15/13 [phone]     Chief Complaint  Patient presents with  . Sore Throat     (Consider location/radiation/quality/duration/timing/severity/associated sxs/prior Treatment) Patient is a 28 y.o. female presenting with pharyngitis. The history is provided by the patient.  Sore Throat This is a new problem. Episode onset: 4 days ago. The problem occurs constantly. The problem has been gradually worsening. Pertinent negatives include no shortness of breath. Associated symptoms comments: Cough, congestion, rhinorrhea, ear pain. The symptoms are aggravated by swallowing. Nothing relieves the symptoms. Treatments tried: otc meds. The treatment provided no relief.    Past Medical History  Diagnosis Date  . Asthma   . Pelvic inflammatory disease   . Pancreatitis   . Polycystic ovary syndrome   . Pancreatic cyst   . 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 Splenectomy, total  2012  . Pancreas surgery  2012    s/p partial pancreatectomy  . Cesarean section N/A 05/23/2012    Procedure: CESAREAN SECTION;  Surgeon: Lahoma Crocker, MD;  Location: [facility];  Service: Obstetrics;  Laterality: N/A;  primary   Family History  Problem Relation Age of Onset  . Diabetes Mother   . Colon cancer Neg Hx    History  Substance Use Topics  . Smoking status: Current Every Day Smoker -- 0.50 packs/day for 13 years    Types: Cigarettes  . Smokeless tobacco: Never Used  . Alcohol Use: 0.0 oz/week     Comment: "weekends"   OB History   Grav Para Term Preterm Abortions TAB SAB Ect Mult Living   4 1 1  3 1 2   1      Review of Systems  Constitutional: Positive for chills. Negative for fever.  HENT: Positive for congestion, ear pain, rhinorrhea, sore throat and voice change.   Respiratory: Positive for  cough. Negative for shortness of breath.   All other systems reviewed and are negative.     Allergies  Dilaudid  Home Medications   Prior to Admission medications   Medication Sig Start Date End Date Taking? Authorizing Provider  acetaminophen (TYLENOL) 500 MG tablet Take 1,000 mg by mouth every 6 (six) hours as needed for pain.    Historical Provider, MD  albuterol (PROAIR HFA) 108 (90 BASE) MCG/ACT inhaler Inhale 2 puffs into the lungs every 6 (six) hours as needed for wheezing or shortness of breath.     Historical Provider, MD  glyBURIDE (DIABETA) 2.5 MG tablet Take 2.5 mg by mouth daily with breakfast.    Historical Provider, MD  Norethindrone-Ethinyl Estradiol-Fe Biphas (LO LOESTRIN FE) 1 MG-10 MCG / 10 MCG tablet Take 1 tablet by mouth daily. 07/05/12   Lahoma Crocker, MD   BP 122/93  Pulse 79  Temp(Src) 98.4 F (36.9 C) (Oral)  Resp 16  Ht 5\' 2"  (1.575 m)  Wt 148 lb (67.132 kg)  BMI 27.06 kg/m2  SpO2 100%  LMP 07/15/2013 Physical Exam  Nursing note and vitals reviewed. Constitutional: She is oriented to person, place, and time. She appears well-developed and well-nourished. No distress.  HENT:  Head: Normocephalic and atraumatic.  Right Ear: Ear canal normal.  Left Ear: Ear canal normal.  Nose: Mucosal edema and rhinorrhea present.  Mouth/Throat: Mucous membranes are normal. Posterior oropharyngeal edema and posterior oropharyngeal erythema present.  No oropharyngeal exudate or tonsillar abscesses.  Bilateral sterile effusion behind the tm's  Eyes: Conjunctivae and EOM are normal. Pupils are equal, round, and reactive to light.  Neck: Normal range of motion. Neck supple.  Cardiovascular: Normal rate, regular rhythm and intact distal pulses.   No murmur heard. Pulmonary/Chest: Effort normal and breath sounds normal. No respiratory distress. She has no wheezes. She has no rales.  Musculoskeletal: Normal range of motion. She exhibits no edema and no tenderness.   Neurological: She is alert and oriented to person, place, and time.  Skin: Skin is warm and dry. No rash noted. No erythema.  Psychiatric: She has a normal mood and affect. Her behavior is normal.    ED Course  Procedures (including critical care time) Labs Review Labs Reviewed  RAPID STREP SCREEN - Abnormal; Notable for the following:    Streptococcus, Group A Screen (Direct) POSITIVE (*)    All other components within normal limits    Imaging Review No results found.   EKG Interpretation None      MDM   Final diagnoses:  Strep pharyngitis  URI (upper respiratory infection)    Pt with symptoms consistent with viral URI in addition to severe sore throat with hx of strep in the past.  Well appearing here with bilateral ear effusion without infection.  No signs of breathing difficulty  No signs of otitis or abnormal abdominal findings.   Strep is positive and pt given amoxicillin    Blanchie Dessert, MD 07/15/13 1015  Blanchie Dessert, MD 07/15/13 1017

## 2013-07-15 NOTE — ED Notes (Signed)
Sore throat  X 4days, states "worse than strep throat". Denies fever, n/v/d. States her son has been sick with cold and ear infections.Pain in R ear starting last night. No relief with tylenol or motrin at home.

## 2013-11-22 ENCOUNTER — Encounter (HOSPITAL_BASED_OUTPATIENT_CLINIC_OR_DEPARTMENT_OTHER): Payer: Self-pay | Admitting: Emergency Medicine

## 2013-11-22 ENCOUNTER — Emergency Department (HOSPITAL_BASED_OUTPATIENT_CLINIC_OR_DEPARTMENT_OTHER)
Admission: EM | Admit: 2013-11-22 | Discharge: 2013-11-22 | Disposition: A | Discharge status: Home / Self Care | Payer: Medicaid Other | Attending: Emergency Medicine | Admitting: Emergency Medicine

## 2013-11-22 DIAGNOSIS — J45909 Unspecified asthma, uncomplicated: Secondary | ICD-10-CM | POA: Insufficient documentation

## 2013-11-22 DIAGNOSIS — O9989 Other specified diseases and conditions complicating pregnancy, childbirth and the puerperium: Secondary | ICD-10-CM | POA: Insufficient documentation

## 2013-11-22 DIAGNOSIS — E119 Type 2 diabetes mellitus without complications: Secondary | ICD-10-CM | POA: Insufficient documentation

## 2013-11-22 DIAGNOSIS — Z3490 Encounter for supervision of normal pregnancy, unspecified, unspecified trimester: Secondary | ICD-10-CM

## 2013-11-22 DIAGNOSIS — Z79899 Other long term (current) drug therapy: Secondary | ICD-10-CM | POA: Insufficient documentation

## 2013-11-22 DIAGNOSIS — J069 Acute upper respiratory infection, unspecified: Secondary | ICD-10-CM | POA: Insufficient documentation

## 2013-11-22 DIAGNOSIS — J029 Acute pharyngitis, unspecified: Secondary | ICD-10-CM | POA: Insufficient documentation

## 2013-11-22 DIAGNOSIS — K5900 Constipation, unspecified: Secondary | ICD-10-CM | POA: Insufficient documentation

## 2013-11-22 DIAGNOSIS — O99330 Smoking (tobacco) complicating pregnancy, unspecified trimester: Secondary | ICD-10-CM | POA: Insufficient documentation

## 2013-11-22 LAB — URINALYSIS, ROUTINE W REFLEX MICROSCOPIC
Bilirubin Urine: NEGATIVE
Glucose, UA: 100 mg/dL — AB
HGB URINE DIPSTICK: NEGATIVE
Ketones, ur: 15 mg/dL — AB
Leukocytes, UA: NEGATIVE
Nitrite: NEGATIVE
Protein, ur: 100 mg/dL — AB
Specific Gravity, Urine: 1.025 (ref 1.005–1.030)
Urobilinogen, UA: 1 mg/dL (ref 0.0–1.0)
pH: 6 (ref 5.0–8.0)

## 2013-11-22 LAB — PREGNANCY, URINE: Preg Test, Ur: POSITIVE — AB

## 2013-11-22 LAB — URINE MICROSCOPIC-ADD ON

## 2013-11-22 LAB — RAPID STREP SCREEN (MED CTR MEBANE ONLY): Streptococcus, Group A Screen (Direct): NEGATIVE

## 2013-11-22 NOTE — ED Notes (Addendum)
Sore throat, fever, runny nose, cough.  Joint pain.  Pt also c/o constipation.

## 2013-11-22 NOTE — ED Provider Notes (Signed)
CSN: [id number]     Arrival date & time 11/22/13  1114 History   First MD Initiated Contact with Patient 11/22/13 1242     Chief Complaint  Patient presents with  . Sore Throat     (Consider location/radiation/quality/duration/timing/severity/associated sxs/prior Treatment) Patient is a 28 y.o. female presenting with pharyngitis. The history is provided by the patient. No language interpreter was used.  Sore Throat This is a new problem. The current episode started in the past 7 days. Associated symptoms include congestion, coughing and a sore throat. Pertinent negatives include no abdominal pain, chills, fever, nausea or vomiting. Associated symptoms comments: She has generalized body aches, nasal congestion and rhinorrhea, sore throat and symptoms of constipation for the past several days. No significant cough. No vomiting. .    Past Medical History  Diagnosis Date  . Asthma   . Pelvic inflammatory disease   . Pancreatitis   . Polycystic ovary syndrome   . Pancreatic cyst   . 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 Splenectomy, total  2012  . Pancreas surgery  2012    s/p partial pancreatectomy  . Cesarean section N/A 05/23/2012    Procedure: CESAREAN SECTION;  Surgeon: Lahoma Crocker, MD;  Location: [facility];  Service: Obstetrics;  Laterality: N/A;  primary   Family History  Problem Relation Age of Onset  . Diabetes Mother   . Colon cancer Neg Hx    History  Substance Use Topics  . Smoking status: Current Every Day Smoker -- 0.50 packs/day for 13 years    Types: Cigarettes  . Smokeless tobacco: Never Used  . Alcohol Use: 0.0 oz/week     Comment: "weekends"   OB History   Grav Para Term Preterm Abortions TAB SAB Ect Mult Living   4 1 1  3 1 2   1      Review of Systems  Constitutional: Negative for fever and chills.  HENT: Positive for congestion, rhinorrhea and sore  throat.   Respiratory: Positive for cough. Negative for shortness of breath.   Cardiovascular: Negative.   Gastrointestinal: Positive for constipation. Negative for nausea, vomiting and abdominal pain.  Genitourinary: Negative.  Negative for dysuria, vaginal bleeding, vaginal discharge and vaginal pain.       She reports irregular menses for the past 6 months.  Musculoskeletal: Negative.   Skin: Negative.   Neurological: Negative.       Allergies  Dilaudid  Home Medications   Prior to Admission medications   Medication Sig Start Date End Date Taking? Authorizing Provider  acetaminophen (TYLENOL) 500 MG tablet Take 1,000 mg by mouth every 6 (six) hours as needed for pain.    Historical Provider, MD  albuterol (PROAIR HFA) 108 (90 BASE) MCG/ACT inhaler Inhale 2 puffs into the lungs every 6 (six) hours as needed for wheezing or shortness of breath.     Historical Provider, MD  glyBURIDE (DIABETA) 2.5 MG tablet Take 2.5 mg by mouth daily with breakfast.    Historical Provider, MD   BP 101/74  Pulse 84  Temp(Src) 98 F (36.7 C) (Oral)  Resp 16  Ht 5\' 5"  (1.651 m)  Wt 145 lb (65.772 kg)  BMI 24.13 kg/m2  SpO2 100%  LMP 09/16/2013 Physical Exam  Constitutional: She is oriented to person, place, and time. She appears well-developed and well-nourished.  HENT:  Head: Normocephalic.  Nose: No mucosal edema.  Mouth/Throat: Oropharynx is  clear and moist. No oropharyngeal exudate.  Neck: Normal range of motion. Neck supple.  Cardiovascular: Normal rate and regular rhythm.   Pulmonary/Chest: Effort normal and breath sounds normal.  Abdominal: Soft. Bowel sounds are normal. There is no tenderness. There is no rebound and no guarding.  Musculoskeletal: Normal range of motion. She exhibits no edema.  Neurological: She is alert and oriented to person, place, and time.  Skin: Skin is warm and dry. No rash noted.  Psychiatric: She has a normal mood and affect.    ED Course  Procedures  (including critical care time) Labs Review Labs Reviewed  PREGNANCY, URINE - Abnormal; Notable for the following:    Preg Test, Ur POSITIVE (*)    All other components within normal limits  URINALYSIS, ROUTINE W REFLEX MICROSCOPIC - Abnormal; Notable for the following:    Glucose, UA 100 (*)    Ketones, ur 15 (*)    Protein, ur 100 (*)    All other components within normal limits  URINE MICROSCOPIC-ADD ON - Abnormal; Notable for the following:    Squamous Epithelial / LPF MANY (*)    All other components within normal limits  RAPID STREP SCREEN  CULTURE, GROUP A STREP    Imaging Review No results found.   EKG Interpretation None      MDM   Final diagnoses:  None    1. Pharyngitis 2. URI 3. Pregnancy  NO vaginal discharge or bleeding. No abdominal pain in setting of incidental finding of pregnancy. No suspicion of complication or compromise. Abdomen non-tender. She has symptoms of URI and sore throat with negative strep - probably viral. Recommended supportive care.     Dewaine Oats, PA-C 11/22/13 1411

## 2013-11-22 NOTE — ED Provider Notes (Signed)
Medical screening examination/treatment/procedure(s) were performed by non-physician practitioner and as supervising physician I was immediately available for consultation/collaboration.   EKG Interpretation None        Fredia Sorrow, MD 11/22/13 1538

## 2013-11-22 NOTE — Discharge Instructions (Signed)
Salt Water Gargle °This solution will help make your mouth and throat feel better. °HOME CARE INSTRUCTIONS  °· Mix 1 teaspoon of salt in 8 ounces of warm water. °· Gargle with this solution as much or often as you need or as directed. Swish and gargle gently if you have any sores or wounds in your mouth. °· Do not swallow this mixture. °Document Released: 11/26/2003 Document Revised: 05/16/2011 Document Reviewed: 04/18/2008 °ExitCare® Patient Information ©2015 [employer]. This information is not intended to replace advice given to you by your health care provider. Make sure you discuss any questions you have with your health care provider. °Sore Throat °A sore throat is pain, burning, irritation, or scratchiness of the throat. There is often pain or tenderness when swallowing or talking. A sore throat may be accompanied by other symptoms, such as coughing, sneezing, fever, and swollen neck glands. A sore throat is often the first sign of another sickness, such as a cold, flu, strep throat, or mononucleosis (commonly known as mono). Most sore throats go away without medical treatment. °CAUSES  °The most common causes of a sore throat include: °· A viral infection, such as a cold, flu, or mono. °· A bacterial infection, such as strep throat, tonsillitis, or whooping cough. °· Seasonal allergies. °· Dryness in the air. °· Irritants, such as smoke or pollution. °· Gastroesophageal reflux disease (GERD). °HOME CARE INSTRUCTIONS  °· Only take over-the-counter medicines as directed by your caregiver. °· Drink enough fluids to keep your urine clear or pale yellow. °· Rest as needed. °· Try using throat sprays, lozenges, or sucking on hard candy to ease any pain (if older than 4 years or as directed). °· Sip warm liquids, such as broth, herbal tea, or warm water with honey to relieve pain temporarily. You may also eat or drink cold or frozen liquids such as frozen ice pops. °· Gargle with salt water (mix 1 tsp salt with 8  oz of water). °· Do not smoke and avoid secondhand smoke. °· Put a cool-mist humidifier in your bedroom at night to moisten the air. You can also turn on a hot shower and sit in the bathroom with the door closed for 5-10 minutes. °SEEK IMMEDIATE MEDICAL CARE IF: °· You have difficulty breathing. °· You are unable to swallow fluids, soft foods, or your saliva. °· You have increased swelling in the throat. °· Your sore throat does not get better in 7 days. °· You have nausea and vomiting. °· You have a fever or persistent symptoms for more than 2-3 days. °· You have a fever and your symptoms suddenly get worse. °MAKE SURE YOU:  °· Understand these instructions. °· Will watch your condition. °· Will get help right away if you are not doing well or get worse. °Document Released: 03/31/2004 Document Revised: 02/08/2012 Document Reviewed: 10/30/2011 °ExitCare® Patient Information ©2015 [employer]. This information is not intended to replace advice given to you by your health care provider. Make sure you discuss any questions you have with your health care provider. ° °

## 2013-11-24 LAB — CULTURE, GROUP A STREP

## 2013-12-04 ENCOUNTER — Inpatient Hospital Stay (HOSPITAL_COMMUNITY): Payer: Medicaid Other

## 2013-12-04 ENCOUNTER — Inpatient Hospital Stay (HOSPITAL_COMMUNITY)
Admission: AD | Admit: 2013-12-04 | Discharge: 2013-12-04 | Disposition: A | Discharge status: Home / Self Care | Payer: Medicaid Other | Source: Ambulatory Visit | Attending: Obstetrics & Gynecology | Admitting: Obstetrics & Gynecology

## 2013-12-04 ENCOUNTER — Encounter (HOSPITAL_COMMUNITY): Payer: Self-pay

## 2013-12-04 DIAGNOSIS — R109 Unspecified abdominal pain: Secondary | ICD-10-CM | POA: Insufficient documentation

## 2013-12-04 DIAGNOSIS — IMO0001 Reserved for inherently not codable concepts without codable children: Secondary | ICD-10-CM

## 2013-12-04 DIAGNOSIS — O24312 Unspecified pre-existing diabetes mellitus in pregnancy, second trimester: Secondary | ICD-10-CM

## 2013-12-04 DIAGNOSIS — O2390 Unspecified genitourinary tract infection in pregnancy, unspecified trimester: Secondary | ICD-10-CM | POA: Insufficient documentation

## 2013-12-04 DIAGNOSIS — N760 Acute vaginitis: Secondary | ICD-10-CM

## 2013-12-04 DIAGNOSIS — O98819 Other maternal infectious and parasitic diseases complicating pregnancy, unspecified trimester: Secondary | ICD-10-CM

## 2013-12-04 DIAGNOSIS — B9689 Other specified bacterial agents as the cause of diseases classified elsewhere: Secondary | ICD-10-CM | POA: Insufficient documentation

## 2013-12-04 DIAGNOSIS — A499 Bacterial infection, unspecified: Secondary | ICD-10-CM | POA: Insufficient documentation

## 2013-12-04 DIAGNOSIS — O99330 Smoking (tobacco) complicating pregnancy, unspecified trimester: Secondary | ICD-10-CM | POA: Insufficient documentation

## 2013-12-04 LAB — CBC
HCT: 31.1 % — ABNORMAL LOW (ref 36.0–46.0)
Hemoglobin: 11.2 g/dL — ABNORMAL LOW (ref 12.0–15.0)
MCH: 24.5 pg — AB (ref 26.0–34.0)
MCHC: 36 g/dL (ref 30.0–36.0)
MCV: 67.9 fL — ABNORMAL LOW (ref 78.0–100.0)
PLATELETS: 458 10*3/uL — AB (ref 150–400)
RBC: 4.58 MIL/uL (ref 3.87–5.11)
RDW: 14 % (ref 11.5–15.5)
WBC: 9 10*3/uL (ref 4.0–10.5)

## 2013-12-04 LAB — HCG, QUANTITATIVE, PREGNANCY: hCG, Beta Chain, Quant, S: 16745 m[IU]/mL — ABNORMAL HIGH (ref <5)

## 2013-12-04 LAB — URINALYSIS, ROUTINE W REFLEX MICROSCOPIC
BILIRUBIN URINE: NEGATIVE
Glucose, UA: NEGATIVE mg/dL
Hgb urine dipstick: NEGATIVE
Ketones, ur: 15 mg/dL — AB
Leukocytes, UA: NEGATIVE
Nitrite: NEGATIVE
PROTEIN: NEGATIVE mg/dL
Specific Gravity, Urine: 1.02 (ref 1.005–1.030)
UROBILINOGEN UA: 1 mg/dL (ref 0.0–1.0)
pH: 6 (ref 5.0–8.0)

## 2013-12-04 LAB — WET PREP, GENITAL
Trich, Wet Prep: NONE SEEN
Yeast Wet Prep HPF POC: NONE SEEN

## 2013-12-04 LAB — HIV ANTIBODY (ROUTINE TESTING W REFLEX): HIV: NONREACTIVE

## 2013-12-04 LAB — GLUCOSE, CAPILLARY: Glucose-Capillary: 69 mg/dL — ABNORMAL LOW (ref 70–99)

## 2013-12-04 MED ORDER — METRONIDAZOLE 500 MG PO TABS: 500.0000 mg | ORAL_TABLET | Freq: Two times a day (BID) | ORAL | Status: DC

## 2013-12-04 NOTE — MAU Note (Signed)
Pt states here for yeast infection, and to make sure baby is ok. Found out she was pregnant after reviewing discharge instructions from a week prior. Does have some vaginal itching, and white thick discharge.

## 2013-12-04 NOTE — MAU Provider Note (Signed)
History     CSN: [id number]  Arrival date and time: 12/04/13 1018   First Provider Initiated Contact with Patient 12/04/13 1250      Chief Complaint  Patient presents with  . Possible Pregnancy  . Vaginitis   HPI [name] 28 y.o. [id number] presents to MAU complaining of abdominal pain and discharge in pregnancy.  Her pain began 2 days ago and is in the lower abdomen and is described as a crampy/tender pain up to 7/10 severity.  The pain comes and goes.   She has not used any medications or tried anything to help her pain.  She denies vaginal bleeding, dysuria.  She noticed white discharge with itching 2 days ago.  She commonly gets yeast infections since developing diabetes 2012.   OB History   Grav Para Term Preterm Abortions TAB SAB Ect Mult Living   5 1 1  3 1 2   1       Past Medical History  Diagnosis Date  . Asthma   . Pelvic inflammatory disease   . Pancreatitis   . Polycystic ovary syndrome   . Pancreatic cyst   . 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 Splenectomy, total  2012  . Pancreas surgery  2012    s/p partial pancreatectomy  . Cesarean section N/A 05/23/2012    Procedure: CESAREAN SECTION;  Surgeon: Lahoma Crocker, MD;  Location: [facility];  Service: Obstetrics;  Laterality: N/A;  primary    Family History  Problem Relation Age of Onset  . Diabetes Mother   . Colon cancer Neg Hx     History  Substance Use Topics  . Smoking status: Current Every Day Smoker -- 0.50 packs/day for 13 years    Types: Cigarettes  . Smokeless tobacco: Never Used  . Alcohol Use: 0.0 oz/week     Comment: "weekends"    Allergies:  Allergies  Allergen Reactions  . Dilaudid [Hydromorphone Hcl] Itching and Other (See Comments)    hallucinations "Felt like she was losing her mind"    Prescriptions prior to admission  Medication Sig Dispense Refill  .  acetaminophen (TYLENOL) 500 MG tablet Take 1,000 mg by mouth every 6 (six) hours as needed for pain.      Marland Kitchen albuterol (PROAIR HFA) 108 (90 BASE) MCG/ACT inhaler Inhale 2 puffs into the lungs every 6 (six) hours as needed for wheezing or shortness of breath.         Review of Systems  Constitutional: Positive for fever and diaphoresis. Negative for chills.  HENT: Negative for congestion and sore throat.   Eyes: Negative for blurred vision and double vision.  Respiratory: Negative for cough, shortness of breath and wheezing.   Cardiovascular: Negative for chest pain and palpitations.  Gastrointestinal: Positive for heartburn, nausea, abdominal pain and constipation. Negative for vomiting and diarrhea.  Genitourinary: Positive for frequency. Negative for dysuria and hematuria.  Musculoskeletal: Positive for neck pain. Negative for back pain.  Skin: Negative for itching and rash.  Neurological: Positive for dizziness and headaches. Negative for tingling and weakness.  Psychiatric/Behavioral: Positive for depression. Negative for suicidal ideas and substance abuse. The patient is not nervous/anxious.    Physical Exam   Blood pressure 91/65, pulse 86, temperature 98.8 F (37.1 C), temperature source Oral, resp. rate 18, height 5' (1.524 m), weight 150 lb 4 oz (68.153 kg), last menstrual period 09/16/2013, not currently  breastfeeding.  Physical Exam  Constitutional: She is oriented to person, place, and time. She appears well-developed and well-nourished. No distress.  HENT:  Head: Normocephalic and atraumatic.  Eyes: EOM are normal.  Neck: Normal range of motion.  Cardiovascular: Normal rate, regular rhythm and normal heart sounds.   Respiratory: Effort normal and breath sounds normal. No respiratory distress.  GI: Soft. Bowel sounds are normal. She exhibits no distension. There is tenderness.  Tenderness across lower abdomen RLQ>LLQ  Genitourinary:  Moderate amt of white, thin, frothy  discharge in vagina.   No CMT/no adnexal mass or tenderness.  Cervix is closed.    Musculoskeletal: Normal range of motion.  Neurological: She is alert and oriented to person, place, and time.  Skin: Skin is warm and dry.  Psychiatric: She has a normal mood and affect.   Results for orders placed during the hospital encounter of 12/04/13 (from the past 24 hour(s))  URINALYSIS, ROUTINE W REFLEX MICROSCOPIC     Status: Abnormal   Collection Time    12/04/13 11:03 AM      Result Value Ref Range   Color, Urine YELLOW  YELLOW   APPearance HAZY (*) CLEAR   Specific Gravity, Urine 1.020  1.005 - 1.030   pH 6.0  5.0 - 8.0   Glucose, UA NEGATIVE  NEGATIVE mg/dL   Hgb urine dipstick NEGATIVE  NEGATIVE   Bilirubin Urine NEGATIVE  NEGATIVE   Ketones, ur 15 (*) NEGATIVE mg/dL   Protein, ur NEGATIVE  NEGATIVE mg/dL   Urobilinogen, UA 1.0  0.0 - 1.0 mg/dL   Nitrite NEGATIVE  NEGATIVE   Leukocytes, UA NEGATIVE  NEGATIVE  CBC     Status: Abnormal   Collection Time    12/04/13  1:08 PM      Result Value Ref Range   WBC 9.0  4.0 - 10.5 K/uL   RBC 4.58  3.87 - 5.11 MIL/uL   Hemoglobin 11.2 (*) 12.0 - 15.0 g/dL   HCT 31.1 (*) 36.0 - 46.0 %   MCV 67.9 (*) 78.0 - 100.0 fL   MCH 24.5 (*) 26.0 - 34.0 pg   MCHC 36.0  30.0 - 36.0 g/dL   RDW 14.0  11.5 - 15.5 %   Platelets 458 (*) 150 - 400 K/uL  HCG, QUANTITATIVE, PREGNANCY     Status: Abnormal   Collection Time    12/04/13  1:08 PM      Result Value Ref Range   hCG, Beta Chain, Quant, S 16745 (*) <5 mIU/mL  WET PREP, GENITAL     Status: Abnormal   Collection Time    12/04/13  1:10 PM      Result Value Ref Range   Yeast Wet Prep HPF POC NONE SEEN  NONE SEEN   Trich, Wet Prep NONE SEEN  NONE SEEN   Clue Cells Wet Prep HPF POC MODERATE (*) NONE SEEN   WBC, Wet Prep HPF POC FEW (*) NONE SEEN  GLUCOSE, CAPILLARY     Status: Abnormal   Collection Time    12/04/13  2:58 PM      Result Value Ref Range   Glucose-Capillary 69 (*) 70 - 99 mg/dL    US Ob Limited  12/04/2013   OBSTETRICAL ULTRASOUND: This exam was performed within a [facility]. The OB US report was generated in the [facility] system, and faxed to the ordering physician.   This report is available in the [employer]. See the [facility] Obstetric US report  via the Image Link.    MAU Course  Procedures  MDM U/S reveals dating closer to 19 weeks, 4 days.  Bacterial vaginosis per wet prep.    Assessment and Plan  A: IUP at 19 weeks, Bacterial vaginosis  P: Discharge to home Flagyl bid x 1 week No sex/etoh x 10 days OTC Tylenol for pain/discomfort Obtain PNC asap PNV qd Return to MAU for emergency/worsening of sxs  Paticia Stack 12/04/2013, 12:55 PM

## 2013-12-04 NOTE — Discharge Instructions (Signed)
Bacterial Vaginosis Bacterial vaginosis is a vaginal infection that occurs when the normal balance of bacteria in the vagina is disrupted. It results from an overgrowth of certain bacteria. This is the most common vaginal infection in women of childbearing age. Treatment is important to prevent complications, especially in pregnant women, as it can cause a premature delivery. CAUSES  Bacterial vaginosis is caused by an increase in harmful bacteria that are normally present in smaller amounts in the vagina. Several different kinds of bacteria can cause bacterial vaginosis. However, the reason that the condition develops is not fully understood. RISK FACTORS Certain activities or behaviors can put you at an increased risk of developing bacterial vaginosis, including:  Having a new sex partner or multiple sex partners.  Douching.  Using an intrauterine device (IUD) for contraception. Women do not get bacterial vaginosis from toilet seats, bedding, swimming pools, or contact with objects around them. SIGNS AND SYMPTOMS  Some women with bacterial vaginosis have no signs or symptoms. Common symptoms include:  Grey vaginal discharge.  A fishlike odor with discharge, especially after sexual intercourse.  Itching or burning of the vagina and vulva.  Burning or pain with urination. DIAGNOSIS  Your health care provider will take a medical history and examine the vagina for signs of bacterial vaginosis. A sample of vaginal fluid may be taken. Your health care provider will look at this sample under a microscope to check for bacteria and abnormal cells. A vaginal pH test may also be done.  TREATMENT  Bacterial vaginosis may be treated with antibiotic medicines. These may be given in the form of a pill or a vaginal cream. A second round of antibiotics may be prescribed if the condition comes back after treatment.  HOME CARE INSTRUCTIONS   Only take over-the-counter or prescription medicines as  directed by your health care provider.  If antibiotic medicine was prescribed, take it as directed. Make sure you finish it even if you start to feel better.  Do not have sex until treatment is completed.  Tell all sexual partners that you have a vaginal infection. They should see their health care provider and be treated if they have problems, such as a mild rash or itching.  Practice safe sex by using condoms and only having one sex partner. SEEK MEDICAL CARE IF:   Your symptoms are not improving after 3 days of treatment.  You have increased discharge or pain.  You have a fever. MAKE SURE YOU:   Understand these instructions.  Will watch your condition.  Will get help right away if you are not doing well or get worse. FOR MORE INFORMATION  Centers for Disease Control and Prevention, Division of STD Prevention: www.cdc.gov/std American Sexual Health Association (ASHA): www.ashastd.org  Document Released: 02/21/2005 Document Revised: 12/12/2012 Document Reviewed: 10/03/2012 ExitCare Patient Information 2015 [employer]. This information is not intended to replace advice given to you by your health care provider. Make sure you discuss any questions you have with your health care provider.  

## 2013-12-05 LAB — GC/CHLAMYDIA PROBE AMP
CT Probe RNA: NEGATIVE
GC Probe RNA: NEGATIVE

## 2013-12-10 ENCOUNTER — Encounter (HOSPITAL_COMMUNITY): Payer: Self-pay | Admitting: *Deleted

## 2013-12-25 ENCOUNTER — Encounter: Payer: Self-pay | Admitting: Obstetrics & Gynecology

## 2013-12-25 ENCOUNTER — Other Ambulatory Visit: Payer: Self-pay | Admitting: Obstetrics & Gynecology

## 2013-12-25 ENCOUNTER — Ambulatory Visit (INDEPENDENT_AMBULATORY_CARE_PROVIDER_SITE_OTHER): Payer: Medicaid Other | Admitting: Obstetrics & Gynecology

## 2013-12-25 VITALS — BP 107/75 | HR 83 | Temp 97.6°F | Wt 155.0 lb

## 2013-12-25 DIAGNOSIS — O99332 Smoking (tobacco) complicating pregnancy, second trimester: Secondary | ICD-10-CM

## 2013-12-25 DIAGNOSIS — O99330 Smoking (tobacco) complicating pregnancy, unspecified trimester: Secondary | ICD-10-CM | POA: Insufficient documentation

## 2013-12-25 DIAGNOSIS — O3421 Maternal care for scar from previous cesarean delivery: Secondary | ICD-10-CM

## 2013-12-25 DIAGNOSIS — Z3482 Encounter for supervision of other normal pregnancy, second trimester: Secondary | ICD-10-CM

## 2013-12-25 DIAGNOSIS — O09292 Supervision of pregnancy with other poor reproductive or obstetric history, second trimester: Secondary | ICD-10-CM

## 2013-12-25 DIAGNOSIS — O24912 Unspecified diabetes mellitus in pregnancy, second trimester: Secondary | ICD-10-CM

## 2013-12-25 DIAGNOSIS — O34219 Maternal care for unspecified type scar from previous cesarean delivery: Secondary | ICD-10-CM | POA: Insufficient documentation

## 2013-12-25 DIAGNOSIS — J45909 Unspecified asthma, uncomplicated: Secondary | ICD-10-CM | POA: Insufficient documentation

## 2013-12-25 DIAGNOSIS — J4520 Mild intermittent asthma, uncomplicated: Secondary | ICD-10-CM

## 2013-12-25 DIAGNOSIS — Z23 Encounter for immunization: Secondary | ICD-10-CM

## 2013-12-25 DIAGNOSIS — O09299 Supervision of pregnancy with other poor reproductive or obstetric history, unspecified trimester: Secondary | ICD-10-CM | POA: Insufficient documentation

## 2013-12-25 NOTE — Progress Notes (Signed)
Subjective:    [name] is being seen today for her first obstetrical visit.  This is not a planned pregnancy. She is at [redacted]w[redacted]d gestation. Her obstetrical history is significant for macrosomia, smoker and pregestational diabetes. Pregnancy history fully reviewed.  The information documented in the HPI was reviewed and verified.  Menstrual History: OB History   Grav Para Term Preterm Abortions TAB SAB Ect Mult Living   5 1 1  3 1 2   1        Patient's last menstrual period was 09/16/2013.    Past Medical History  Diagnosis Date  . Asthma   . Pelvic inflammatory disease   . Pancreatitis   . Polycystic ovary syndrome   . Pancreatic cyst   . 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 Splenectomy, total  2012  . Pancreas surgery  2012    s/p partial pancreatectomy  . Cesarean section N/A 05/23/2012    Procedure: CESAREAN SECTION;  Surgeon: Lahoma Crocker, MD;  Location: [facility];  Service: Obstetrics;  Laterality: N/A;  primary     (Not in a hospital admission) Allergies  Allergen Reactions  . Dilaudid [Hydromorphone Hcl] Itching and Other (See Comments)    hallucinations "Felt like she was losing her mind"    History  Substance Use Topics  . Smoking status: Current Every Day Smoker -- 0.50 packs/day for 13 years    Types: Cigarettes  . Smokeless tobacco: Never Used  . Alcohol Use: No    Family History  Problem Relation Age of Onset  . Diabetes Mother   . Colon cancer Neg Hx      Review of Systems Constitutional: negative for weight loss Gastrointestinal: negative for vomiting Genitourinary:negative for genital lesions and vaginal discharge and dysuria Musculoskeletal:negative for back pain Behavioral/Psych: negative for abusive relationship, depression, illegal drug usage and tobacco use    Objective:    BP 107/75  Pulse 83  Temp(Src) 97.6 F (36.4 C)   Wt 70.308 kg (155 lb)  LMP 09/16/2013 General Appearance:    Alert, cooperative, no distress, appears stated age  Head:    Normocephalic, without obvious abnormality, atraumatic  Eyes:    PERRL, conjunctiva/corneas clear, EOM's intact, fundi    benign, both eyes  Ears:    Normal TM's and external ear canals, both ears  Nose:   Nares normal, septum midline, mucosa normal, no drainage    or sinus tenderness  Throat:   Lips, mucosa, and tongue normal; teeth and gums normal  Neck:   Supple, symmetrical, trachea midline, no adenopathy;    thyroid:  no enlargement/tenderness/nodules; no carotid   bruit or JVD  Back:     Symmetric, no curvature, ROM normal, no CVA tenderness  Lungs:     Clear to auscultation bilaterally, respirations unlabored  Chest Wall:    No tenderness or deformity   Heart:    Regular rate and rhythm, S1 and S2 normal, no murmur, rub   or gallop  Breast Exam:    No tenderness, masses, or nipple abnormality  Abdomen:     Soft, non-tender, bowel sounds active all four quadrants,    no masses, no organomegaly  Genitalia:    Normal female without lesion, discharge or tenderness  Extremities:   Extremities normal, atraumatic, no cyanosis or edema  Pulses:   2+ and symmetric all extremities  Skin:   Skin color, texture, turgor  normal, no rashes or lesions  Lymph nodes:   Cervical, supraclavicular, and axillary nodes normal  Neurologic:   CNII-XII intact, normal strength, sensation and reflexes    throughout      Lab Review Urine pregnancy test Labs reviewed no Radiologic studies reviewed no Assessment:    Pregnancy at [redacted]w[redacted]d weeks    Plan:      Prenatal vitamins.  Counseling provided regarding continued use of seat belts, cessation of alcohol consumption, smoking or use of illicit drugs; infection precautions i.e., influenza/TDAP immunizations, toxoplasmosis,CMV, parvovirus, listeria and varicella; workplace safety, exercise during pregnancy; routine dental care,  safe medications, sexual activity, hot tubs, saunas, pools, travel, caffeine use, fish and methlymercury, potential toxins, hair treatments, varicose veins Weight gain recommendations per IOM guidelines reviewed: overweight/BMI 25 - 29.9--> gain 15 - 25 lbs Problem list reviewed and updated. Role of ultrasound in pregnancy discussed; fetal survey: ordered. Amniocentesis discussed: not indicated.  Orders Placed This Encounter  Procedures  . Culture, OB Urine  . Obstetric panel  . HIV antibody  . Hemoglobinopathy evaluation  . Varicella zoster antibody, IgG  . Vit D  25 hydroxy (rtn osteoporosis monitoring)  . TSH  . POCT urinalysis dipstick   Referral-->MFM Follow up in 1 weeks.

## 2013-12-25 NOTE — Patient Instructions (Signed)
Smoking Cessation  Quitting smoking is important to your health and has many advantages. However, it is not always easy to quit since nicotine is a very addictive drug. Oftentimes, people try 3 times or more before being able to quit. This document explains the best ways for you to prepare to quit smoking. Quitting takes hard work and a lot of effort, but you can do it.  ADVANTAGES OF QUITTING SMOKING  · You will live longer, feel better, and live better.  · Your body will feel the impact of quitting smoking almost immediately.  · Within 20 minutes, blood pressure decreases. Your pulse returns to its normal level.  · After 8 hours, carbon monoxide levels in the blood return to normal. Your oxygen level increases.  · After 24 hours, the chance of having a heart attack starts to decrease. Your breath, hair, and body stop smelling like smoke.  · After 48 hours, damaged nerve endings begin to recover. Your sense of taste and smell improve.  · After 72 hours, the body is virtually free of nicotine. Your bronchial tubes relax and breathing becomes easier.  · After 2 to 12 weeks, lungs can hold more air. Exercise becomes easier and circulation improves.  · The risk of having a heart attack, stroke, cancer, or lung disease is greatly reduced.  · After 1 year, the risk of coronary heart disease is cut in half.  · After 5 years, the risk of stroke falls to the same as a nonsmoker.  · After 10 years, the risk of lung cancer is cut in half and the risk of other cancers decreases significantly.  · After 15 years, the risk of coronary heart disease drops, usually to the level of a nonsmoker.  · If you are pregnant, quitting smoking will improve your chances of having a healthy baby.  · The people you live with, especially any children, will be healthier.  · You will have extra money to spend on things other than cigarettes.  QUESTIONS TO THINK ABOUT BEFORE ATTEMPTING TO QUIT  You may want to talk about your answers with your  health care provider.  · Why do you want to quit?  · If you tried to quit in the past, what helped and what did not?  · What will be the most difficult situations for you after you quit? How will you plan to handle them?  · Who can help you through the tough times? Your family? Friends? A health care provider?  · What pleasures do you get from smoking? What ways can you still get pleasure if you quit?  Here are some questions to ask your health care provider:  · How can you help me to be successful at quitting?  · What medicine do you think would be best for me and how should I take it?  · What should I do if I need more help?  · What is smoking withdrawal like? How can I get information on withdrawal?  GET READY  · Set a quit date.  · Change your environment by getting rid of all cigarettes, ashtrays, matches, and lighters in your home, car, or work. Do not let people smoke in your home.  · Review your past attempts to quit. Think about what worked and what did not.  GET SUPPORT AND ENCOURAGEMENT  You have a better chance of being successful if you have help. You can get support in many ways.  · Tell your family, friends, and   coworkers that you are going to quit and need their support. Ask them not to smoke around you.  · Get individual, group, or telephone counseling and support. Programs are available at local hospitals and health centers. Call your local health department for information about programs in your area.  · Spiritual beliefs and practices may help some smokers quit.  · Download a "quit meter" on your computer to keep track of quit statistics, such as how long you have gone without smoking, cigarettes not smoked, and money saved.  · Get a self-help book about quitting smoking and staying off tobacco.  LEARN NEW SKILLS AND BEHAVIORS  · Distract yourself from urges to smoke. Talk to someone, go for a walk, or occupy your time with a task.  · Change your normal routine. Take a different route to work.  Drink tea instead of coffee. Eat breakfast in a different place.  · Reduce your stress. Take a hot bath, exercise, or read a book.  · Plan something enjoyable to do every day. Reward yourself for not smoking.  · Explore interactive web-based programs that specialize in helping you quit.  GET MEDICINE AND USE IT CORRECTLY  Medicines can help you stop smoking and decrease the urge to smoke. Combining medicine with the above behavioral methods and support can greatly increase your chances of successfully quitting smoking.  · Nicotine replacement therapy helps deliver nicotine to your body without the negative effects and risks of smoking. Nicotine replacement therapy includes nicotine gum, lozenges, inhalers, nasal sprays, and skin patches. Some may be available over-the-counter and others require a prescription.  · Antidepressant medicine helps people abstain from smoking, but how this works is unknown. This medicine is available by prescription.  · Nicotinic receptor partial agonist medicine simulates the effect of nicotine in your brain. This medicine is available by prescription.  Ask your health care provider for advice about which medicines to use and how to use them based on your health history. Your health care provider will tell you what side effects to look out for if you choose to be on a medicine or therapy. Carefully read the information on the package. Do not use any other product containing nicotine while using a nicotine replacement product.   RELAPSE OR DIFFICULT SITUATIONS  Most relapses occur within the first 3 months after quitting. Do not be discouraged if you start smoking again. Remember, most people try several times before finally quitting. You may have symptoms of withdrawal because your body is used to nicotine. You may crave cigarettes, be irritable, feel very hungry, cough often, get headaches, or have difficulty concentrating. The withdrawal symptoms are only temporary. They are strongest  when you first quit, but they will go away within 10-14 days.  To reduce the chances of relapse, try to:  · Avoid drinking alcohol. Drinking lowers your chances of successfully quitting.  · Reduce the amount of caffeine you consume. Once you quit smoking, the amount of caffeine in your body increases and can give you symptoms, such as a rapid heartbeat, sweating, and anxiety.  · Avoid smokers because they can make you want to smoke.  · Do not let weight gain distract you. Many smokers will gain weight when they quit, usually less than 10 pounds. Eat a healthy diet and stay active. You can always lose the weight gained after you quit.  · Find ways to improve your mood other than smoking.  FOR MORE INFORMATION   www.smokefree.gov   Document Released:   02/15/2001 Document Revised: 07/08/2013 Document Reviewed: 06/02/2011  ExitCare® Patient Information ©2015 [employer]. This information is not intended to replace advice given to you by your health care provider. Make sure you discuss any questions you have with your health care provider.  Smoking Cessation, Tips for Success  If you are ready to quit smoking, congratulations! You have chosen to help yourself be healthier. Cigarettes bring nicotine, tar, carbon monoxide, and other irritants into your body. Your lungs, heart, and blood vessels will be able to work better without these poisons. There are many different ways to quit smoking. Nicotine gum, nicotine patches, a nicotine inhaler, or nicotine nasal spray can help with physical craving. Hypnosis, support groups, and medicines help break the habit of smoking.  WHAT THINGS CAN I DO TO MAKE QUITTING EASIER?   Here are some tips to help you quit for good:  · Pick a date when you will quit smoking completely. Tell all of your friends and family about your plan to quit on that date.  · Do not try to slowly cut down on the number of cigarettes you are smoking. Pick a quit date and quit smoking completely starting on  that day.  · Throw away all cigarettes.    · Clean and remove all ashtrays from your home, work, and car.  · On a card, write down your reasons for quitting. Carry the card with you and read it when you get the urge to smoke.  · Cleanse your body of nicotine. Drink enough water and fluids to keep your urine clear or pale yellow. Do this after quitting to flush the nicotine from your body.  · Learn to predict your moods. Do not let a bad situation be your excuse to have a cigarette. Some situations in your life might tempt you into wanting a cigarette.  · Never have "just one" cigarette. It leads to wanting another and another. Remind yourself of your decision to quit.  · Change habits associated with smoking. If you smoked while driving or when feeling stressed, try other activities to replace smoking. Stand up when drinking your coffee. Brush your teeth after eating. Sit in a different chair when you read the paper. Avoid alcohol while trying to quit, and try to drink fewer caffeinated beverages. Alcohol and caffeine may urge you to smoke.  · Avoid foods and drinks that can trigger a desire to smoke, such as sugary or spicy foods and alcohol.  · Ask people who smoke not to smoke around you.  · Have something planned to do right after eating or having a cup of coffee. For example, plan to take a walk or exercise.  · Try a relaxation exercise to calm you down and decrease your stress. Remember, you may be tense and nervous for the first 2 weeks after you quit, but this will pass.  · Find new activities to keep your hands busy. Play with a pen, coin, or rubber band. Doodle or draw things on paper.  · Brush your teeth right after eating. This will help cut down on the craving for the taste of tobacco after meals. You can also try mouthwash.    · Use oral substitutes in place of cigarettes. Try using lemon drops, carrots, cinnamon sticks, or chewing gum. Keep them handy so they are available when you have the urge to  smoke.  · When you have the urge to smoke, try deep breathing.  · Designate your home as a nonsmoking area.  ·   If you are a heavy smoker, ask your health care provider about a prescription for nicotine chewing gum. It can ease your withdrawal from nicotine.  · Reward yourself. Set aside the cigarette money you save and buy yourself something nice.  · Look for support from others. Join a support group or smoking cessation program. Ask someone at home or at work to help you with your plan to quit smoking.  · Always ask yourself, "Do I need this cigarette or is this just a reflex?" Tell yourself, "Today, I choose not to smoke," or "I do not want to smoke." You are reminding yourself of your decision to quit.  · Do not replace cigarette smoking with electronic cigarettes (commonly called e-cigarettes). The safety of e-cigarettes is unknown, and some may contain harmful chemicals.  · If you relapse, do not give up! Plan ahead and think about what you will do the next time you get the urge to smoke.  HOW WILL I FEEL WHEN I QUIT SMOKING?  You may have symptoms of withdrawal because your body is used to nicotine (the addictive substance in cigarettes). You may crave cigarettes, be irritable, feel very hungry, cough often, get headaches, or have difficulty concentrating. The withdrawal symptoms are only temporary. They are strongest when you first quit but will go away within 10-14 days. When withdrawal symptoms occur, stay in control. Think about your reasons for quitting. Remind yourself that these are signs that your body is healing and getting used to being without cigarettes. Remember that withdrawal symptoms are easier to treat than the major diseases that smoking can cause.   Even after the withdrawal is over, expect periodic urges to smoke. However, these cravings are generally short lived and will go away whether you smoke or not. Do not smoke!  WHAT RESOURCES ARE AVAILABLE TO HELP ME QUIT SMOKING?  Your health care  provider can direct you to community resources or hospitals for support, which may include:  · Group support.  · Education.  · Hypnosis.  · Therapy.  Document Released: 11/20/2003 Document Revised: 07/08/2013 Document Reviewed: 08/09/2012  ExitCare® Patient Information ©2015 [employer]. This information is not intended to replace advice given to you by your health care provider. Make sure you discuss any questions you have with your health care provider.

## 2013-12-26 LAB — OBSTETRIC PANEL
Antibody Screen: NEGATIVE
BASOS ABS: 0 10*3/uL (ref 0.0–0.1)
BASOS PCT: 0 % (ref 0–1)
EOS ABS: 0.1 10*3/uL (ref 0.0–0.7)
Eosinophils Relative: 1 % (ref 0–5)
HCT: 31.6 % — ABNORMAL LOW (ref 36.0–46.0)
HEP B S AG: NEGATIVE
Hemoglobin: 10.7 g/dL — ABNORMAL LOW (ref 12.0–15.0)
Lymphocytes Relative: 18 % (ref 12–46)
Lymphs Abs: 1.6 10*3/uL (ref 0.7–4.0)
MCH: 24.2 pg — AB (ref 26.0–34.0)
MCHC: 33.9 g/dL (ref 30.0–36.0)
MCV: 71.3 fL — ABNORMAL LOW (ref 78.0–100.0)
Monocytes Absolute: 0.7 10*3/uL (ref 0.1–1.0)
Monocytes Relative: 8 % (ref 3–12)
NEUTROS PCT: 73 % (ref 43–77)
Neutro Abs: 6.4 10*3/uL (ref 1.7–7.7)
PLATELETS: 456 10*3/uL — AB (ref 150–400)
RBC: 4.43 MIL/uL (ref 3.87–5.11)
RDW: 15.7 % — ABNORMAL HIGH (ref 11.5–15.5)
Rh Type: POSITIVE
Rubella: 2.57 Index — ABNORMAL HIGH (ref <0.90)
WBC: 8.8 10*3/uL (ref 4.0–10.5)

## 2013-12-26 LAB — VARICELLA ZOSTER ANTIBODY, IGG: Varicella IgG: 297.3 Index — ABNORMAL HIGH (ref <135.00)

## 2013-12-26 LAB — TSH: TSH: 1.221 u[IU]/mL (ref 0.350–4.500)

## 2013-12-26 LAB — PROTEIN / CREATININE RATIO, URINE
Creatinine, Urine: 128.8 mg/dL
Protein Creatinine Ratio: 0.12 (ref <0.15)
Total Protein, Urine: 16 mg/dL (ref 5–24)

## 2013-12-26 LAB — HIV ANTIBODY (ROUTINE TESTING W REFLEX): HIV 1&2 Ab, 4th Generation: NONREACTIVE

## 2013-12-26 LAB — HEMOGLOBIN A1C
Hgb A1c MFr Bld: 5.7 % — ABNORMAL HIGH (ref <5.7)
MEAN PLASMA GLUCOSE: 117 mg/dL — AB (ref <117)

## 2013-12-26 LAB — BASIC METABOLIC PANEL
BUN: 5 mg/dL — AB (ref 6–23)
CO2: 22 mEq/L (ref 19–32)
Calcium: 8.6 mg/dL (ref 8.4–10.5)
Chloride: 106 mEq/L (ref 96–112)
Creat: 0.38 mg/dL — ABNORMAL LOW (ref 0.50–1.10)
GLUCOSE: 58 mg/dL — AB (ref 70–99)
POTASSIUM: 4.4 meq/L (ref 3.5–5.3)
Sodium: 136 mEq/L (ref 135–145)

## 2013-12-26 LAB — VITAMIN D 25 HYDROXY (VIT D DEFICIENCY, FRACTURES): Vit D, 25-Hydroxy: 11 ng/mL — ABNORMAL LOW (ref 30–89)

## 2013-12-27 LAB — HEMOGLOBINOPATHY EVALUATION
HEMOGLOBIN OTHER: 0 %
HGB A2 QUANT: 4.1 % — AB (ref 2.2–3.2)
HGB A: 69.8 % — AB (ref 96.8–97.8)
HGB F QUANT: 0 % (ref 0.0–2.0)
HGB S QUANTITAION: 26.1 % — AB

## 2013-12-27 LAB — CULTURE, OB URINE: Colony Count: 4000

## 2013-12-29 ENCOUNTER — Encounter: Payer: Self-pay | Admitting: Obstetrics & Gynecology

## 2013-12-29 DIAGNOSIS — D573 Sickle-cell trait: Secondary | ICD-10-CM | POA: Insufficient documentation

## 2013-12-29 DIAGNOSIS — E559 Vitamin D deficiency, unspecified: Secondary | ICD-10-CM | POA: Insufficient documentation

## 2013-12-29 NOTE — Progress Notes (Signed)
Quick Note:  Needs PNV w/vitamin D ______

## 2014-01-01 ENCOUNTER — Encounter: Payer: Medicaid Other | Admitting: Obstetrics & Gynecology

## 2014-01-03 ENCOUNTER — Ambulatory Visit (HOSPITAL_COMMUNITY): Payer: Medicaid Other

## 2014-01-06 ENCOUNTER — Encounter (HOSPITAL_COMMUNITY): Payer: Self-pay

## 2014-01-06 ENCOUNTER — Ambulatory Visit (HOSPITAL_COMMUNITY)
Admission: RE | Admit: 2014-01-06 | Discharge: 2014-01-06 | Disposition: A | Discharge status: Home / Self Care | Payer: Medicaid Other | Source: Ambulatory Visit | Attending: Obstetrics & Gynecology | Admitting: Obstetrics & Gynecology

## 2014-01-06 DIAGNOSIS — O3421 Maternal care for scar from previous cesarean delivery: Secondary | ICD-10-CM | POA: Insufficient documentation

## 2014-01-06 DIAGNOSIS — E119 Type 2 diabetes mellitus without complications: Secondary | ICD-10-CM | POA: Diagnosis not present

## 2014-01-06 DIAGNOSIS — F17210 Nicotine dependence, cigarettes, uncomplicated: Secondary | ICD-10-CM | POA: Diagnosis not present

## 2014-01-06 DIAGNOSIS — O0932 Supervision of pregnancy with insufficient antenatal care, second trimester: Secondary | ICD-10-CM | POA: Insufficient documentation

## 2014-01-06 DIAGNOSIS — Z3A24 24 weeks gestation of pregnancy: Secondary | ICD-10-CM | POA: Diagnosis not present

## 2014-01-06 DIAGNOSIS — O24112 Pre-existing diabetes mellitus, type 2, in pregnancy, second trimester: Secondary | ICD-10-CM | POA: Diagnosis not present

## 2014-01-06 DIAGNOSIS — O99332 Smoking (tobacco) complicating pregnancy, second trimester: Secondary | ICD-10-CM | POA: Diagnosis not present

## 2014-01-06 DIAGNOSIS — Z1389 Encounter for screening for other disorder: Secondary | ICD-10-CM | POA: Insufficient documentation

## 2014-01-06 DIAGNOSIS — O24912 Unspecified diabetes mellitus in pregnancy, second trimester: Secondary | ICD-10-CM

## 2014-01-13 ENCOUNTER — Telehealth: Payer: Self-pay | Admitting: *Deleted

## 2014-01-13 NOTE — Telephone Encounter (Signed)
Pt called to office stating that she is 25 weeks and is having pelvic pain and pressure.    Return call to pt.  Pt states that she is having pain where her leg meets her thigh.  Pt made aware that this is possibly round ligament pain and was given comfort measures.  Pt states that she is now working 8 hour shifts and does not get many breaks.  Pt advised that she should rest/take breaks when she can and stay hydrated. Pt states that baby is moving good and denies any leaking of fluid.  Pt made aware that she could be seen in office to further evaluate pain.  Pt states that she was told she needed to pay a balance on account before she could be seen again.  Pt made aware that follow up with billing will be made and to expect a phone call from them.   Pt advised that if her pain was to severe that she should be seen at [facility] for evaluation.   Pt states understanding.

## 2014-01-22 ENCOUNTER — Telehealth: Payer: Self-pay

## 2014-01-22 ENCOUNTER — Encounter: Payer: Self-pay | Admitting: *Deleted

## 2014-01-22 ENCOUNTER — Ambulatory Visit: Payer: Medicaid Other | Admitting: Obstetrics & Gynecology

## 2014-01-22 ENCOUNTER — Encounter: Payer: Self-pay | Admitting: Obstetrics & Gynecology

## 2014-01-22 VITALS — BP 104/69 | HR 90 | Temp 97.1°F | Wt 155.0 lb

## 2014-01-22 DIAGNOSIS — Z3482 Encounter for supervision of other normal pregnancy, second trimester: Secondary | ICD-10-CM

## 2014-01-22 NOTE — Progress Notes (Signed)
Patient states she is having pain in her pelvic region. Patient states her job has recently taken her from 3 shifts a week to 5. Patient states since the change she is having 3-5 contractions a day. Patient states she is working 8 hour shifts and on her feet constantly. Patient states she only get two 10 minute breaks and a 20 minute lunch break. Patient states she is able to empty her bladder frequently. Patient states she has a set of steps she is climbing approximately 50 times an hour.

## 2014-01-22 NOTE — Progress Notes (Signed)
Subjective:    [name] is a 28 y.o. female being seen today for her obstetrical visit. She is at [redacted]w[redacted]d gestation. Patient reports: irregular contractions with physical activity . Fetal movement: normal.  Borderline elevated CBGs.  ?Compliance w/carb-modified diet.  Problem List Items Addressed This Visit    None    Visit Diagnoses    Encounter for supervision of other normal pregnancy in second trimester    -  Primary    Relevant Orders       POCT urinalysis dipstick       HIV antibody       RPR       CBC       Basic Metabolic Panel (BMET)       AMB Referral to Maternal Fetal Medicine (MFM)      Patient Active Problem List   Diagnosis Date Noted  . 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 H/O macrosomia in infant in prior pregnancy, currently pregnant 12/25/2013    Priority: High  . Previous cesarean delivery, antepartum 12/25/2013    Priority: High  . Tobacco use during pregnancy 12/25/2013    Priority: High  . Asthma 12/25/2013    Priority: High  . Pregnancy and non-insulin-dependent diabetes mellitus 05/22/2012    Priority: High  . Diabetes mellitus in pregnancy in second trimester   . [redacted] weeks gestation of pregnancy   . Encounter for routine screening for malformation using ultrasonics   . Vitamin D deficiency 12/29/2013  . Genital warts 12/22/2011  . Pancreatic pseudocyst 06/24/2010   Objective:    BP 104/69 mmHg  Pulse 90  Temp(Src) 97.1 F (36.2 C)  Wt 70.308 kg (155 lb)  LMP 09/16/2013 FHT: 160 BPM  Uterine Size: size equals dates     Assessment:    Pregnancy @ [redacted]w[redacted]d    Plan:    Signs and symptoms of preterm labor: discussed. Referral-->[facility], problem list reviewed and updated Follow up in 1 week.

## 2014-01-22 NOTE — Progress Notes (Signed)
[facility]  [phone]

## 2014-01-22 NOTE — Telephone Encounter (Signed)
PATIENT NEEDS TO GET MEDICAID CHANGED FROM [employer] BEFORE WE CAN SCHEDULE APPTS. SAID SHE WOULD TALK TO CASEWORKER

## 2014-01-23 LAB — RPR

## 2014-01-23 LAB — CBC
HCT: 32.2 % — ABNORMAL LOW (ref 36.0–46.0)
Hemoglobin: 10.8 g/dL — ABNORMAL LOW (ref 12.0–15.0)
MCH: 23.9 pg — AB (ref 26.0–34.0)
MCHC: 33.5 g/dL (ref 30.0–36.0)
MCV: 71.4 fL — AB (ref 78.0–100.0)
MPV: 9.1 fL — AB (ref 9.4–12.4)
Platelets: 441 10*3/uL — ABNORMAL HIGH (ref 150–400)
RBC: 4.51 MIL/uL (ref 3.87–5.11)
RDW: 16.7 % — AB (ref 11.5–15.5)
WBC: 9 10*3/uL (ref 4.0–10.5)

## 2014-01-23 LAB — BASIC METABOLIC PANEL
BUN: 5 mg/dL — AB (ref 6–23)
CALCIUM: 8.3 mg/dL — AB (ref 8.4–10.5)
CO2: 22 mEq/L (ref 19–32)
Chloride: 106 mEq/L (ref 96–112)
Creat: 0.44 mg/dL — ABNORMAL LOW (ref 0.50–1.10)
Glucose, Bld: 88 mg/dL (ref 70–99)
Potassium: 4.1 mEq/L (ref 3.5–5.3)
Sodium: 136 mEq/L (ref 135–145)

## 2014-01-23 LAB — HIV ANTIBODY (ROUTINE TESTING W REFLEX): HIV: NONREACTIVE

## 2014-01-29 ENCOUNTER — Ambulatory Visit (INDEPENDENT_AMBULATORY_CARE_PROVIDER_SITE_OTHER): Payer: Medicaid Other | Admitting: Obstetrics & Gynecology

## 2014-01-29 ENCOUNTER — Encounter: Payer: Medicaid Other | Admitting: Obstetrics & Gynecology

## 2014-01-29 ENCOUNTER — Encounter: Payer: Self-pay | Admitting: Obstetrics & Gynecology

## 2014-01-29 VITALS — BP 98/68 | HR 87 | Temp 98.0°F | Wt 154.0 lb

## 2014-01-29 DIAGNOSIS — R7309 Other abnormal glucose: Secondary | ICD-10-CM

## 2014-01-29 DIAGNOSIS — R7303 Prediabetes: Secondary | ICD-10-CM

## 2014-01-29 DIAGNOSIS — Z3482 Encounter for supervision of other normal pregnancy, second trimester: Secondary | ICD-10-CM

## 2014-01-29 MED ORDER — GLYBURIDE 1.25 MG PO TABS: 1.2500 mg | ORAL_TABLET | Freq: Two times a day (BID) | ORAL | Status: DC

## 2014-01-29 NOTE — Progress Notes (Signed)
CBG: 157  [facility] Flow  [phone]

## 2014-01-31 LAB — POCT URINALYSIS DIPSTICK
Bilirubin, UA: NEGATIVE
Blood, UA: NEGATIVE
GLUCOSE UA: 100
Ketones, UA: NEGATIVE
Leukocytes, UA: NEGATIVE
NITRITE UA: NEGATIVE
PROTEIN UA: NEGATIVE
Spec Grav, UA: 1.025
UROBILINOGEN UA: NEGATIVE
pH, UA: 5.5

## 2014-02-03 NOTE — Progress Notes (Signed)
Subjective:    [name] is a 28 y.o. female being seen today for her obstetrical visit. She is at [redacted]w[redacted]d gestation. Patient reports: no complaints . Fetal movement: normal.  Borderline elevated CBGs.  ?Compliance w/carb-modified diet.  Problem List Items Addressed This Visit    None    Visit Diagnoses    Encounter for supervision of other normal pregnancy in second trimester    -  Primary    Relevant Orders       POCT urinalysis dipstick (Completed)       AMB Referral to Maternal Fetal Medicine (MFM)    Prediabetes        Relevant Medications       glyBURIDE (DIABETA) tablet      Patient Active Problem List   Diagnosis Date Noted  . 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 H/O macrosomia in infant in prior pregnancy, currently pregnant 12/25/2013    Priority: High  . Previous cesarean delivery, antepartum 12/25/2013    Priority: High  . Tobacco use during pregnancy 12/25/2013    Priority: High  . Asthma 12/25/2013    Priority: High  . Pregnancy and non-insulin-dependent diabetes mellitus 05/22/2012    Priority: High  . Diabetes mellitus in pregnancy in second trimester   . [redacted] weeks gestation of pregnancy   . Encounter for routine screening for malformation using ultrasonics   . Vitamin D deficiency 12/29/2013  . Genital warts 12/22/2011  . Pancreatic pseudocyst 06/24/2010   Objective:    BP 98/68 mmHg  Pulse 87  Temp(Src) 98 F (36.7 C)  Wt 69.854 kg (154 lb)  LMP 09/16/2013 FHT: 160 BPM  Uterine Size: size equals dates     Assessment:    Pregnancy @ [redacted]w[redacted]d    Plan:   [facility] Labs, problem list reviewed and updated Follow up in 1 week.

## 2014-02-05 ENCOUNTER — Ambulatory Visit (INDEPENDENT_AMBULATORY_CARE_PROVIDER_SITE_OTHER): Payer: Medicaid Other | Admitting: Obstetrics

## 2014-02-05 ENCOUNTER — Institutional Professional Consult (permissible substitution): Payer: Medicaid Other

## 2014-02-05 VITALS — BP 104/72 | HR 87 | Temp 98.6°F | Wt 156.0 lb

## 2014-02-05 DIAGNOSIS — O24414 Gestational diabetes mellitus in pregnancy, insulin controlled: Secondary | ICD-10-CM

## 2014-02-05 DIAGNOSIS — O24419 Gestational diabetes mellitus in pregnancy, unspecified control: Secondary | ICD-10-CM

## 2014-02-05 LAB — CREATININE, SERUM: Creat: 0.38 mg/dL — ABNORMAL LOW (ref 0.50–1.10)

## 2014-02-05 LAB — ALT: ALT: <8 U/L (ref 0–35)

## 2014-02-05 LAB — LACTATE DEHYDROGENASE: LDH: 142 U/L (ref 94–250)

## 2014-02-05 LAB — AST: AST: 11 U/L (ref 0–37)

## 2014-02-05 MED ORDER — OB COMPLETE PETITE 35-5-1-200 MG PO CAPS: 1.0000 | ORAL_CAPSULE | Freq: Every day | ORAL | Status: DC

## 2014-02-06 ENCOUNTER — Telehealth: Payer: Self-pay

## 2014-02-06 ENCOUNTER — Ambulatory Visit (HOSPITAL_COMMUNITY): Payer: Medicaid Other

## 2014-02-06 ENCOUNTER — Encounter: Payer: Medicaid Other | Admitting: Obstetrics & Gynecology

## 2014-02-06 ENCOUNTER — Encounter: Payer: Self-pay | Admitting: Obstetrics

## 2014-02-06 LAB — CBC
HCT: 32.5 % — ABNORMAL LOW (ref 36.0–46.0)
Hemoglobin: 10.9 g/dL — ABNORMAL LOW (ref 12.0–15.0)
MCH: 23.5 pg — AB (ref 26.0–34.0)
MCHC: 33.5 g/dL (ref 30.0–36.0)
MCV: 70 fL — ABNORMAL LOW (ref 78.0–100.0)
MPV: 9 fL — AB (ref 9.4–12.4)
PLATELETS: 476 10*3/uL — AB (ref 150–400)
RBC: 4.64 MIL/uL (ref 3.87–5.11)
RDW: 16.2 % — AB (ref 11.5–15.5)
WBC: 9.2 10*3/uL (ref 4.0–10.5)

## 2014-02-06 LAB — PATHOLOGIST SMEAR REVIEW

## 2014-02-06 NOTE — Progress Notes (Signed)
Subjective:    [name] is a 28 y.o. female being seen today for her obstetrical visit. She is at [redacted]w[redacted]d gestation. Patient reports no complaints. Fetal movement: normal.  Problem List Items Addressed This Visit    None    Visit Diagnoses    Insulin controlled gestational diabetes mellitus in third trimester    -  Primary    Gestational diabetes mellitus in third trimester, unspecified diabetic control        Relevant Orders       US OB Limited       US OB Follow Up       Lactate dehydrogenase (Completed)       Creatinine, serum (Completed)       CBC (Completed)       ALT (Completed)       AST (Completed)       Pathologist smear review       Protein / creatinine ratio, urine      Patient Active Problem List   Diagnosis Date Noted  . Diabetes mellitus in pregnancy in second trimester   . [redacted] weeks gestation of pregnancy   . Encounter for routine screening for malformation using ultrasonics   . 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 Vitamin D deficiency 12/29/2013  . H/O macrosomia in infant in prior pregnancy, currently pregnant 12/25/2013  . Previous cesarean delivery, antepartum 12/25/2013  . Tobacco use during pregnancy 12/25/2013  . Asthma 12/25/2013  . Pregnancy and non-insulin-dependent diabetes mellitus 05/22/2012  . Genital warts 12/22/2011  . Pancreatic pseudocyst 06/24/2010   Objective:    BP 104/72 mmHg  Pulse 87  Temp(Src) 98.6 F (37 C)  Wt 156 lb (70.761 kg)  LMP 09/16/2013 FHT:  160 BPM  Uterine Size: size equals dates  Presentation: unsure     Assessment:    Pregnancy @ [redacted]w[redacted]d weeks   Plan:     labs reviewed, problem list updated Consent signed. GBS sent TDAP offered  Rhogam given for RH negative Pediatrician: discussed. Infant feeding: plans to breastfeed. Maternity leave: not discussed. Cigarette smoking: smokes 0.5 PPD. Orders Placed This Encounter  Procedures  . US OB Limited    Standing Status: Standing     Number of  Occurrences: 9     Standing Expiration Date: 04/09/2015    Order Specific Question:  Reason for Exam (SYMPTOM  OR DIAGNOSIS REQUIRED)    Answer:  weekly AFI starting at 32 weeks- to start week of 03/02/2014    Order Specific Question:  Preferred imaging location?    Answer:  Internal  . US OB Follow Up    Standing Status: Standing     Number of Occurrences: 3     Standing Expiration Date: 04/09/2015    Order Specific Question:  Reason for Exam (SYMPTOM  OR DIAGNOSIS REQUIRED)    Answer:  growth scan every 4 weeks starting next week    Order Specific Question:  Preferred imaging location?    Answer:  Internal  . Lactate dehydrogenase  . Creatinine, serum  . CBC  . ALT  . AST  . Pathologist smear review  . Protein / creatinine ratio, urine   Meds ordered this encounter  Medications  . Prenat-FeCbn-FeAspGl-FA-Omega (OB COMPLETE PETITE) 35-5-1-200 MG CAPS    Sig: Take 1 capsule by mouth daily.    Dispense:  30 capsule    Refill:  11   Follow up in 2 Weeks.

## 2014-02-06 NOTE — Telephone Encounter (Signed)
[facility] consult appt and rob for 9am and 10am on 12/92 - [facility] had conflict in pm that day

## 2014-02-12 ENCOUNTER — Ambulatory Visit: Payer: Medicaid Other

## 2014-02-12 DIAGNOSIS — O24419 Gestational diabetes mellitus in pregnancy, unspecified control: Secondary | ICD-10-CM

## 2014-02-17 ENCOUNTER — Telehealth: Payer: Self-pay

## 2014-02-17 NOTE — Telephone Encounter (Signed)
let patient know her FMLA papers were ready and 15.00 charge

## 2014-02-19 ENCOUNTER — Institutional Professional Consult (permissible substitution): Payer: Medicaid Other

## 2014-02-19 ENCOUNTER — Encounter: Payer: Medicaid Other | Admitting: Obstetrics & Gynecology

## 2014-02-19 ENCOUNTER — Ambulatory Visit (INDEPENDENT_AMBULATORY_CARE_PROVIDER_SITE_OTHER): Payer: Medicaid Other | Admitting: Obstetrics & Gynecology

## 2014-02-19 ENCOUNTER — Encounter: Payer: Self-pay | Admitting: Obstetrics & Gynecology

## 2014-02-19 VITALS — BP 106/71 | HR 90 | Temp 98.2°F | Wt 157.0 lb

## 2014-02-19 DIAGNOSIS — IMO0001 Reserved for inherently not codable concepts without codable children: Secondary | ICD-10-CM

## 2014-02-19 DIAGNOSIS — R7303 Prediabetes: Secondary | ICD-10-CM

## 2014-02-19 DIAGNOSIS — R7309 Other abnormal glucose: Secondary | ICD-10-CM

## 2014-02-19 DIAGNOSIS — O24313 Unspecified pre-existing diabetes mellitus in pregnancy, third trimester: Secondary | ICD-10-CM

## 2014-02-19 DIAGNOSIS — E119 Type 2 diabetes mellitus without complications: Secondary | ICD-10-CM

## 2014-02-19 LAB — POCT URINALYSIS DIPSTICK
BILIRUBIN UA: NEGATIVE
Blood, UA: NEGATIVE
Glucose, UA: NEGATIVE
LEUKOCYTES UA: NEGATIVE
Nitrite, UA: NEGATIVE
SPEC GRAV UA: 1.02
Urobilinogen, UA: NEGATIVE
pH, UA: 5

## 2014-02-19 MED ORDER — GLYBURIDE 1.25 MG PO TABS: ORAL_TABLET | ORAL | Status: DC

## 2014-02-24 NOTE — Progress Notes (Signed)
Subjective:    [name] is a 28 y.o. female being seen today for her obstetrical visit. She is at [redacted]w[redacted]d gestation. Patient reports no complaints. Fetal movement: normal. Borderline elevated CBGs Problem List Items Addressed This Visit    Pregnancy and non-insulin-dependent diabetes mellitus - Primary   Relevant Medications      glyBURIDE (DIABETA) tablet   Other Relevant Orders      POCT urinalysis dipstick (Completed)      EKG 12-Lead    Other Visit Diagnoses    Prediabetes        Relevant Medications       glyBURIDE (DIABETA) tablet      Patient Active Problem List   Diagnosis Date Noted  . 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 H/O macrosomia in infant in prior pregnancy, currently pregnant 12/25/2013    Priority: High  . Previous cesarean delivery, antepartum 12/25/2013    Priority: High  . Tobacco use during pregnancy 12/25/2013    Priority: High  . Asthma 12/25/2013    Priority: High  . Pregnancy and non-insulin-dependent diabetes mellitus 05/22/2012    Priority: High  . Diabetes mellitus in pregnancy in second trimester   . [redacted] weeks gestation of pregnancy   . Encounter for routine screening for malformation using ultrasonics   . Vitamin D deficiency 12/29/2013  . Genital warts 12/22/2011  . Pancreatic pseudocyst 06/24/2010   Objective:    BP 106/71 mmHg  Pulse 90  Temp(Src) 98.2 F (36.8 C)  Wt 71.215 kg (157 lb)  PF   LMP 09/16/2013 FHT:  140 BPM  Uterine Size: size equals dates  Presentation: cephalic     Assessment:    Pregnancy @ [redacted]w[redacted]d weeks   Plan:     labs reviewed, problem list updated  Orders Placed This Encounter  Procedures  . POCT urinalysis dipstick  . EKG 12-Lead    Please    Scheduling Instructions:     Please Schedule at [facility]    Order Specific Question:  Where should this test be performed    Answer:  OTHER   Meds ordered this encounter  Medications  . glyBURIDE (DIABETA) 1.25 MG tablet    Sig:  1.25 mg 30 minutes before breakfast, 1.25 mg 30 minutes before lunch and 2.5 mg at bedtime.    Dispense:  120 tablet    Refill:  2   Follow up in 1 Week.

## 2014-02-24 NOTE — Patient Instructions (Signed)
Third Trimester of Pregnancy The third trimester is from week 29 through week 42, months 7 through 9. The third trimester is a time when the fetus is growing rapidly. At the end of the ninth month, the fetus is about 20 inches in length and weighs 6-10 pounds.  BODY CHANGES Your body goes through many changes during pregnancy. The changes vary from woman to woman.   Your weight will continue to increase. You can expect to gain 25-35 pounds (11-16 kg) by the end of the pregnancy.  You may begin to get stretch marks on your hips, abdomen, and breasts.  You may urinate more often because the fetus is moving lower into your pelvis and pressing on your bladder.  You may develop or continue to have heartburn as a result of your pregnancy.  You may develop constipation because certain hormones are causing the muscles that push waste through your intestines to slow down.  You may develop hemorrhoids or swollen, bulging veins (varicose veins).  You may have pelvic pain because of the weight gain and pregnancy hormones relaxing your joints between the bones in your pelvis. Backaches may result from overexertion of the muscles supporting your posture.  You may have changes in your hair. These can include thickening of your hair, rapid growth, and changes in texture. Some women also have hair loss during or after pregnancy, or hair that feels dry or thin. Your hair will most likely return to normal after your baby is born.  Your breasts will continue to grow and be tender. A yellow discharge may leak from your breasts called colostrum.  Your belly button may stick out.  You may feel short of breath because of your expanding uterus.  You may notice the fetus "dropping," or moving lower in your abdomen.  You may have a bloody mucus discharge. This usually occurs a few days to a week before labor begins.  Your cervix becomes thin and soft (effaced) near your due date. WHAT TO EXPECT AT YOUR PRENATAL  EXAMS  You will have prenatal exams every 2 weeks until week 36. Then, you will have weekly prenatal exams. During a routine prenatal visit:  You will be weighed to make sure you and the fetus are growing normally.  Your blood pressure is taken.  Your abdomen will be measured to track your baby's growth.  The fetal heartbeat will be listened to.  Any test results from the previous visit will be discussed.  You may have a cervical check near your due date to see if you have effaced. At around 36 weeks, your caregiver will check your cervix. At the same time, your caregiver will also perform a test on the secretions of the vaginal tissue. This test is to determine if a type of bacteria, Group B streptococcus, is present. Your caregiver will explain this further. Your caregiver may ask you:  What your birth plan is.  How you are feeling.  If you are feeling the baby move.  If you have had any abnormal symptoms, such as leaking fluid, bleeding, severe headaches, or abdominal cramping.  If you have any questions. Other tests or screenings that may be performed during your third trimester include:  Blood tests that check for low iron levels (anemia).  Fetal testing to check the health, activity level, and growth of the fetus. Testing is done if you have certain medical conditions or if there are problems during the pregnancy. FALSE LABOR You may feel small, irregular contractions that   eventually go away. These are called Braxton Hicks contractions, or false labor. Contractions may last for hours, days, or even weeks before true labor sets in. If contractions come at regular intervals, intensify, or become painful, it is best to be seen by your caregiver.  SIGNS OF LABOR   Menstrual-like cramps.  Contractions that are 5 minutes apart or less.  Contractions that start on the top of the uterus and spread down to the lower abdomen and back.  A sense of increased pelvic pressure or back  pain.  A watery or bloody mucus discharge that comes from the vagina. If you have any of these signs before the 37th week of pregnancy, call your caregiver right away. You need to go to the hospital to get checked immediately. HOME CARE INSTRUCTIONS   Avoid all smoking, herbs, alcohol, and unprescribed drugs. These chemicals affect the formation and growth of the baby.  Follow your caregiver's instructions regarding medicine use. There are medicines that are either safe or unsafe to take during pregnancy.  Exercise only as directed by your caregiver. Experiencing uterine cramps is a good sign to stop exercising.  Continue to eat regular, healthy meals.  Wear a good support bra for breast tenderness.  Do not use hot tubs, steam rooms, or saunas.  Wear your seat belt at all times when driving.  Avoid raw meat, uncooked cheese, cat litter boxes, and soil used by cats. These carry germs that can cause birth defects in the baby.  Take your prenatal vitamins.  Try taking a stool softener (if your caregiver approves) if you develop constipation. Eat more high-fiber foods, such as fresh vegetables or fruit and whole grains. Drink plenty of fluids to keep your urine clear or pale yellow.  Take warm sitz baths to soothe any pain or discomfort caused by hemorrhoids. Use hemorrhoid cream if your caregiver approves.  If you develop varicose veins, wear support hose. Elevate your feet for 15 minutes, 3-4 times a day. Limit salt in your diet.  Avoid heavy lifting, wear low heal shoes, and practice good posture.  Rest a lot with your legs elevated if you have leg cramps or low back pain.  Visit your dentist if you have not gone during your pregnancy. Use a soft toothbrush to brush your teeth and be gentle when you floss.  A sexual relationship may be continued unless your caregiver directs you otherwise.  Do not travel far distances unless it is absolutely necessary and only with the approval  of your caregiver.  Take prenatal classes to understand, practice, and ask questions about the labor and delivery.  Make a trial run to the hospital.  Pack your hospital bag.  Prepare the baby's nursery.  Continue to go to all your prenatal visits as directed by your caregiver. SEEK MEDICAL CARE IF:  You are unsure if you are in labor or if your water has broken.  You have dizziness.  You have mild pelvic cramps, pelvic pressure, or nagging pain in your abdominal area.  You have persistent nausea, vomiting, or diarrhea.  You have a bad smelling vaginal discharge.  You have pain with urination. SEEK IMMEDIATE MEDICAL CARE IF:   You have a fever.  You are leaking fluid from your vagina.  You have spotting or bleeding from your vagina.  You have severe abdominal cramping or pain.  You have rapid weight loss or gain.  You have shortness of breath with chest pain.  You notice sudden or extreme swelling   of your face, hands, ankles, feet, or legs.  You have not felt your baby move in over an hour.  You have severe headaches that do not go away with medicine.  You have vision changes. Document Released: 02/15/2001 Document Revised: 02/26/2013 Document Reviewed: 04/24/2012 [employer] Patient Information 2015 [employer]. This information is not intended to replace advice given to you by your health care provider. Make sure you discuss any questions you have with your health care provider.  

## 2014-02-26 ENCOUNTER — Encounter: Payer: Self-pay | Admitting: Obstetrics & Gynecology

## 2014-02-26 ENCOUNTER — Ambulatory Visit (INDEPENDENT_AMBULATORY_CARE_PROVIDER_SITE_OTHER): Payer: 59 | Admitting: Obstetrics & Gynecology

## 2014-02-26 VITALS — BP 107/72 | HR 105 | Temp 99.0°F | Wt 152.0 lb

## 2014-02-26 DIAGNOSIS — Z029 Encounter for administrative examinations, unspecified: Secondary | ICD-10-CM

## 2014-02-26 DIAGNOSIS — Z3483 Encounter for supervision of other normal pregnancy, third trimester: Secondary | ICD-10-CM

## 2014-02-26 LAB — POCT URINALYSIS DIPSTICK
BILIRUBIN UA: NEGATIVE
Blood, UA: NEGATIVE
Glucose, UA: 1000
Ketones, UA: NEGATIVE
NITRITE UA: NEGATIVE
Protein, UA: NEGATIVE
Spec Grav, UA: 1.025
Urobilinogen, UA: NEGATIVE
pH, UA: 6

## 2014-02-28 NOTE — Progress Notes (Signed)
Subjective:    [name] is a 28 y.o. female being seen today for her obstetrical visit. She is at [redacted]w[redacted]d gestation. Patient reports no complaints. Fetal movement: normal. CBGs in range Problem List Items Addressed This Visit    None    Visit Diagnoses    Encounter for supervision of other normal pregnancy in third trimester    -  Primary    Relevant Orders       POCT urinalysis dipstick (Completed)      Patient Active Problem List   Diagnosis Date Noted  . 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 H/O macrosomia in infant in prior pregnancy, currently pregnant 12/25/2013    Priority: High  . Previous cesarean delivery, antepartum 12/25/2013    Priority: High  . Tobacco use during pregnancy 12/25/2013    Priority: High  . Asthma 12/25/2013    Priority: High  . Pregnancy and non-insulin-dependent diabetes mellitus 05/22/2012    Priority: High  . Diabetes mellitus in pregnancy in second trimester   . [redacted] weeks gestation of pregnancy   . Encounter for routine screening for malformation using ultrasonics   . Vitamin D deficiency 12/29/2013  . Genital warts 12/22/2011  . Pancreatic pseudocyst 06/24/2010   Objective:    BP 107/72 mmHg  Pulse 105  Temp(Src) 99 F (37.2 C)  Wt 68.947 kg (152 lb)  LMP 09/16/2013 FHT:  140 BPM  Uterine Size: size equals dates  Presentation: cephalic     Assessment:    Pregnancy @ [redacted]w[redacted]d weeks   Plan:     labs reviewed, problem list updated  Orders Placed This Encounter  Procedures  . POCT urinalysis dipstick   Start NSTs at next visit Follow up in 1 Week.

## 2014-02-28 NOTE — Patient Instructions (Signed)
Nonstress Test The nonstress test is a procedure that monitors the fetus's heartbeat. The test will monitor the heartbeat when the fetus is at rest and while the fetus is moving. In a healthy fetus, there will be an increase in fetal heart rate when the fetus moves or kicks. The heart rate will decrease at rest. This test helps determine if the fetus is healthy. Your health care provider will look at a number of patterns in the heart rate tracing to make sure your baby is thriving. If there is concern, your health care provider may order additional tests or may suggest another course of action. This test is often done in the third trimester and can help determine if an early delivery is needed and safe. Common reasons to have this test are:  You are past your due date.  You have a high-risk pregnancy.  You are feeling less movement than normal.  You have lost a pregnancy in the past.  Your health care provider suspects fetal growth problems.  You have too much or too little amniotic fluid. BEFORE THE PROCEDURE  Eat a meal right before the test or as directed by your health care provider. Food may help stimulate fetal movements.  Use the restroom right before the test. PROCEDURE  Two belts will be placed around your abdomen. These belts have monitors attached to them. One records the fetal heart rate and the other records uterine contractions.  You may be asked to lie down on your side or to stay sitting upright.  You may be given a button to press when you feel movement.  The fetal heartbeat is listened to and watched on a screen. The heartbeat is recorded on a sheet of paper.  If the fetus seems to be sleeping, you may be asked to drink some juice or soda, gently press your abdomen, or make some noise to wake the fetus. AFTER THE PROCEDURE  Your health care provider will discuss the test results with you and make recommendations for the near future. Document Released: 02/11/2002  Document Revised: 07/08/2013 Document Reviewed: 03/27/2012 [facility] Patient Information 2015 [place], [place]. This information is not intended to replace advice given to you by your health care provider. Make sure you discuss any questions you have with your health care provider.

## 2014-03-03 ENCOUNTER — Other Ambulatory Visit: Payer: Self-pay | Admitting: Obstetrics & Gynecology

## 2014-03-03 ENCOUNTER — Encounter: Payer: Self-pay | Admitting: *Deleted

## 2014-03-03 DIAGNOSIS — O24419 Gestational diabetes mellitus in pregnancy, unspecified control: Secondary | ICD-10-CM

## 2014-03-03 DIAGNOSIS — Z3689 Encounter for other specified antenatal screening: Secondary | ICD-10-CM

## 2014-03-04 ENCOUNTER — Encounter: Payer: Self-pay | Admitting: Obstetrics & Gynecology

## 2014-03-05 ENCOUNTER — Encounter: Payer: Self-pay | Admitting: Obstetrics & Gynecology

## 2014-03-05 ENCOUNTER — Other Ambulatory Visit: Payer: Self-pay | Admitting: Obstetrics & Gynecology

## 2014-03-05 ENCOUNTER — Other Ambulatory Visit: Payer: Self-pay | Admitting: *Deleted

## 2014-03-05 ENCOUNTER — Ambulatory Visit (INDEPENDENT_AMBULATORY_CARE_PROVIDER_SITE_OTHER): Payer: Medicaid Other | Admitting: Obstetrics & Gynecology

## 2014-03-05 ENCOUNTER — Ambulatory Visit (INDEPENDENT_AMBULATORY_CARE_PROVIDER_SITE_OTHER): Payer: 59

## 2014-03-05 VITALS — BP 99/66 | HR 86 | Wt 157.0 lb

## 2014-03-05 DIAGNOSIS — Z3689 Encounter for other specified antenatal screening: Secondary | ICD-10-CM

## 2014-03-05 DIAGNOSIS — O24913 Unspecified diabetes mellitus in pregnancy, third trimester: Secondary | ICD-10-CM

## 2014-03-05 DIAGNOSIS — Z3483 Encounter for supervision of other normal pregnancy, third trimester: Secondary | ICD-10-CM

## 2014-03-05 DIAGNOSIS — O24419 Gestational diabetes mellitus in pregnancy, unspecified control: Secondary | ICD-10-CM

## 2014-03-05 LAB — US OB LIMITED

## 2014-03-05 LAB — POCT URINALYSIS DIPSTICK
Bilirubin, UA: NEGATIVE
Blood, UA: NEGATIVE
Glucose, UA: 1000
LEUKOCYTES UA: NEGATIVE
Nitrite, UA: NEGATIVE
PROTEIN UA: NEGATIVE
Spec Grav, UA: 1.02
UROBILINOGEN UA: NEGATIVE
pH, UA: 6

## 2014-03-06 ENCOUNTER — Telehealth: Payer: Self-pay

## 2014-03-06 NOTE — Patient Instructions (Signed)
Sterilization Information, Female Female sterilization is a procedure to permanently prevent pregnancy. There are different ways to perform sterilization, but all either block or close the fallopian tubes so that your eggs cannot reach your uterus. If your egg cannot reach your uterus, sperm cannot fertilize the egg, and you cannot get pregnant.  Sterilization is performed by a surgical procedure. Sometimes these procedures are performed in a hospital while a patient is asleep. Sometimes they can be done in a clinic setting with the patient awake. The fallopian tubes can be surgically cut, tied, or sealed through a procedure called tubal ligation. The fallopian tubes can also be closed with clips or rings. Sterilization can also be done by placing a tiny coil into each fallopian tube, which causes scar tissue to grow inside the tube. The scar tissue then blocks the tubes.  Discuss sterilization with your caregiver to answer any concerns you or your partner may have. You may want to ask what type of sterilization your caregiver performs. Some caregivers may not perform all the various options. Sterilization is permanent and should only be done if you are sure you do not want children or do not want any more children. Having a sterilization reversed may not be successful.  STERILIZATION PROCEDURES  Laparoscopic sterilization. This is a surgical method performed at a time other than right after childbirth. Two incisions are made in the lower abdomen. A thin, lighted tube (laparoscope) is inserted into one of the incisions and is used to perform the procedure. The fallopian tubes are closed with a ring or a clip. An instrument that uses heat could be used to seal the tubes closed (electrocautery).   Mini-laparotomy. This is a surgical method done 1 or 2 days after giving birth. Typically, a small incision is made just below the belly button (umbilicus) and the fallopian tubes are exposed. The tubes can then be  sealed, tied, or cut.   Hysteroscopic sterilization. This is performed at a time other than right after childbirth. A tiny, spring-like coil is inserted through the cervix and uterus and placed into the fallopian tubes. The coil causes scaring and blocks the tubes. Other forms of contraception should be used for 3 months after the procedure to allow the scar tissue to form completely. Additionally, it is required hysterosalpingography be done 3 months later to ensure that the procedure was successful. Hysterosalpingography is a procedure that uses X-rays to look at your uterus and fallopian tubes after a material to make them show up better has been inserted. IS STERILIZATION SAFE? Sterilization is considered safe with very rare complications. Risks depend on the type of procedure you have. As with any surgical procedure, there are risks. Some risks of sterilization by any means include:   Bleeding.  Infection.  Reaction to anesthesia medicine.  Injury to surrounding organs. Risks specific to having hysteroscopic coils placed include:  The coils may not be placed correctly the first time.   The coils may move out of place.   The tubes may not get completely blocked after 3 months.   Injury to surrounding organs when placing the coil.  HOW EFFECTIVE IS FEMALE STERILIZATION? Sterilization is nearly 100% effective, but it can fail. Depending on the type of sterilization, the rate of failure can be as high as 3%. After hysteroscopic sterilization with placement of fallopian tube coils, you will need back-up birth control for 3 months after the procedure. Sterilization is effective for a lifetime.  BENEFITS OF STERILIZATION  It does   not affect your hormones, and therefore will not affect your menstrual periods, sexual desire, or performance.   It is effective for a lifetime.   It is safe.   You do not need to worry about getting pregnant. Keep in mind that if you had the  hysteroscopic placement procedure, you must wait 3 months after the procedure (or until your caregiver confirms) before pregnancy is not considered possible.   There are no side effects unlike other types of birth control (contraception).  DRAWBACKS OF STERILIZATION  You must be sure you do not want children or any more children. The procedure is permanent.   It does not provide protection against sexually transmitted infections (STIs).   The tubes can grow back together. If this happens, there is a risk of pregnancy. There is also an increased risk (50%) of pregnancy being an ectopic pregnancy. This is a pregnancy that happens outside of the uterus. Document Released: 08/10/2007 Document Revised: 02/26/2013 Document Reviewed: 06/09/2011 [facility] Patient Information 2015 [place], [place]. This information is not intended to replace advice given to you by your health care provider. Make sure you discuss any questions you have with your health care provider.

## 2014-03-06 NOTE — Telephone Encounter (Signed)
appt with [place] 1/7 at 1:30pm

## 2014-03-06 NOTE — Progress Notes (Signed)
Subjective:    [name] is a 28 y.o. female being seen today for her obstetrical visit. She is at [redacted]w[redacted]d gestation. Patient reports no complaints. Fetal movement: normal. CBGs in range Problem List Items Addressed This Visit    None    Visit Diagnoses    Encounter for supervision of other normal pregnancy in third trimester    -  Primary    Relevant Orders       POCT urinalysis dipstick (Completed)      Patient Active Problem List   Diagnosis Date Noted  . 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 H/O macrosomia in infant in prior pregnancy, currently pregnant 12/25/2013    Priority: High  . Previous cesarean delivery, antepartum 12/25/2013    Priority: High  . Tobacco use during pregnancy 12/25/2013    Priority: High  . Asthma 12/25/2013    Priority: High  . Pregnancy and non-insulin-dependent diabetes mellitus 05/22/2012    Priority: High  . Diabetes mellitus in pregnancy in second trimester   . [redacted] weeks gestation of pregnancy   . Encounter for routine screening for malformation using ultrasonics   . Vitamin D deficiency 12/29/2013  . Genital warts 12/22/2011  . Pancreatic pseudocyst 06/24/2010   Objective:    BP 99/66 mmHg  Pulse 86  Wt 71.215 kg (157 lb)  LMP 09/16/2013 FHT:  140 BPM  Uterine Size: size equals dates  Presentation: cephalic     Assessment:    Pregnancy @ [redacted]w[redacted]d weeks   Plan:     labs reviewed, problem list updated  Orders Placed This Encounter  Procedures  . POCT urinalysis dipstick    Follow up in 1 Week.

## 2014-03-12 ENCOUNTER — Other Ambulatory Visit: Payer: Self-pay | Admitting: Obstetrics

## 2014-03-12 ENCOUNTER — Ambulatory Visit (INDEPENDENT_AMBULATORY_CARE_PROVIDER_SITE_OTHER): Payer: 59 | Admitting: Obstetrics

## 2014-03-12 ENCOUNTER — Encounter: Payer: Self-pay | Admitting: Obstetrics

## 2014-03-12 ENCOUNTER — Ambulatory Visit (INDEPENDENT_AMBULATORY_CARE_PROVIDER_SITE_OTHER): Payer: 59

## 2014-03-12 ENCOUNTER — Other Ambulatory Visit: Payer: Medicaid Other

## 2014-03-12 VITALS — BP 116/74 | HR 96 | Wt 162.0 lb

## 2014-03-12 DIAGNOSIS — Z3689 Encounter for other specified antenatal screening: Secondary | ICD-10-CM

## 2014-03-12 DIAGNOSIS — O24913 Unspecified diabetes mellitus in pregnancy, third trimester: Secondary | ICD-10-CM

## 2014-03-12 DIAGNOSIS — O24410 Gestational diabetes mellitus in pregnancy, diet controlled: Secondary | ICD-10-CM

## 2014-03-12 LAB — POCT URINALYSIS DIPSTICK
Bilirubin, UA: NEGATIVE
Ketones, UA: NEGATIVE
LEUKOCYTES UA: NEGATIVE
Nitrite, UA: NEGATIVE
Protein, UA: NEGATIVE
RBC UA: NEGATIVE
Spec Grav, UA: 1.01
Urobilinogen, UA: NEGATIVE
pH, UA: 6

## 2014-03-12 LAB — US OB FOLLOW UP

## 2014-03-12 NOTE — Progress Notes (Signed)
Subjective:    [name] is a 29 y.o. female being seen today for her obstetrical visit. She is at [redacted]w[redacted]d gestation. Patient reports no complaints. Fetal movement: normal.  Problem List Items Addressed This Visit    None    Visit Diagnoses    Diet controlled gestational diabetes mellitus in third trimester    -  Primary    Relevant Orders       POCT urinalysis dipstick (Completed)       Fetal non-stress test      Patient Active Problem List   Diagnosis Date Noted  . Diabetes mellitus in pregnancy in second trimester   . [redacted] weeks gestation of pregnancy   . Encounter for routine screening for malformation using ultrasonics   . 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 Vitamin D deficiency 12/29/2013  . H/O macrosomia in infant in prior pregnancy, currently pregnant 12/25/2013  . Previous cesarean delivery, antepartum 12/25/2013  . Tobacco use during pregnancy 12/25/2013  . Asthma 12/25/2013  . Pregnancy and non-insulin-dependent diabetes mellitus 05/22/2012  . Genital warts 12/22/2011  . Pancreatic pseudocyst 06/24/2010   Objective:    BP 116/74 mmHg  Pulse 96  Wt 162 lb (73.483 kg)  LMP 09/16/2013 FHT:  150 BPM  Uterine Size: size equals dates  Presentation: unsure     NST:  Reactive  Assessment:    Pregnancy @ [redacted]w[redacted]d weeks   Plan:     labs reviewed, problem list updated Consent signed. GBS sent TDAP offered  Rhogam given for RH negative Pediatrician: discussed. Infant feeding: plans to breastfeed. Maternity leave: discussed. Cigarette smoking: smokes 0.5 PPD. Orders Placed This Encounter  Procedures  . Fetal non-stress test    Standing Status: Standing     Number of Occurrences: 1     Standing Expiration Date:   . POCT urinalysis dipstick   No orders of the defined types were placed in this encounter.   Follow up in 1 Week.

## 2014-03-13 ENCOUNTER — Encounter: Payer: Medicaid Other | Attending: Obstetrics | Admitting: *Deleted

## 2014-03-13 ENCOUNTER — Ambulatory Visit (HOSPITAL_COMMUNITY)
Admission: RE | Admit: 2014-03-13 | Discharge: 2014-03-13 | Disposition: A | Discharge status: Home / Self Care | Payer: Medicaid Other | Source: Ambulatory Visit | Attending: Obstetrics | Admitting: Obstetrics

## 2014-03-13 ENCOUNTER — Other Ambulatory Visit: Payer: Self-pay | Admitting: *Deleted

## 2014-03-13 DIAGNOSIS — O24419 Gestational diabetes mellitus in pregnancy, unspecified control: Secondary | ICD-10-CM | POA: Insufficient documentation

## 2014-03-13 DIAGNOSIS — Z713 Dietary counseling and surveillance: Secondary | ICD-10-CM | POA: Diagnosis not present

## 2014-03-13 DIAGNOSIS — O24913 Unspecified diabetes mellitus in pregnancy, third trimester: Secondary | ICD-10-CM

## 2014-03-13 NOTE — Progress Notes (Signed)
  Patient was seen on 03/13/14 for self-management of pre-diabetes complicated by pregnancy . [name] was a patient of Dr. Lahoma Crocker. She participated in Tele-visits for her DSME with Kerin Perna through [facility]. She comes to [place] today as Dr. Jodi Mourning is now her attending provider. In review of Diet Recall I found [name] to be consuming a greater number of carbohydrates per meal than recommended. When eating at home she was making good choices and exercising reasonable portion control. She eats out frequently and goes over the recommended carbohydrate intake. We discussed modifications that she could make that would be more in line with dietary guidelines.  The following learning objectives were met by the patient :   States why dietary management is important in controlling blood glucose  Describes the effects of carbohydrates on blood glucose levels  Demonstrates ability to create a balanced meal plan  Demonstrates carbohydrate counting   States when to check blood glucose levels  Demonstrates proper blood glucose monitoring techniques  States the effect of stress and exercise on blood glucose levels  States the importance of limiting caffeine and abstaining from alcohol and smoking  Plan:  Aim for 2 Carb Choices per meal (30 grams) +/- 1 either way for breakfast Aim for 3 Carb Choices per meal (45 grams) +/- 1 either way from lunch and dinner Aim for 1-2 Carbs per snack Begin reading food labels for Total Carbohydrate and sugar grams of foods Begin checking BG before breakfast and 2 hours after first bit of breakfast, lunch and dinner after  as directed by MD  Take medication  as directed by MD  Patient instructed to monitor glucose levels: Patient testing glucose per Dr. Lahoma Crocker. FBS: 60 - <90 2 hour: <120  Patient received the following handouts:  Carbohydrate Counting List  Meal Planning worksheet  Patient will be seen for follow-up as  needed.

## 2014-03-18 ENCOUNTER — Other Ambulatory Visit: Payer: Self-pay | Admitting: Obstetrics

## 2014-03-18 ENCOUNTER — Ambulatory Visit (HOSPITAL_COMMUNITY)
Admission: RE | Admit: 2014-03-18 | Discharge: 2014-03-18 | Disposition: A | Discharge status: Home / Self Care | Payer: Medicaid Other | Source: Ambulatory Visit | Attending: Obstetrics | Admitting: Obstetrics

## 2014-03-18 ENCOUNTER — Encounter (HOSPITAL_COMMUNITY): Payer: Self-pay

## 2014-03-18 DIAGNOSIS — E119 Type 2 diabetes mellitus without complications: Secondary | ICD-10-CM | POA: Insufficient documentation

## 2014-03-18 DIAGNOSIS — Z3A36 36 weeks gestation of pregnancy: Secondary | ICD-10-CM | POA: Insufficient documentation

## 2014-03-18 DIAGNOSIS — F17210 Nicotine dependence, cigarettes, uncomplicated: Secondary | ICD-10-CM | POA: Insufficient documentation

## 2014-03-18 DIAGNOSIS — O99333 Smoking (tobacco) complicating pregnancy, third trimester: Secondary | ICD-10-CM | POA: Insufficient documentation

## 2014-03-18 DIAGNOSIS — O24912 Unspecified diabetes mellitus in pregnancy, second trimester: Secondary | ICD-10-CM

## 2014-03-18 DIAGNOSIS — O3421 Maternal care for scar from previous cesarean delivery: Secondary | ICD-10-CM | POA: Diagnosis not present

## 2014-03-18 DIAGNOSIS — J45909 Unspecified asthma, uncomplicated: Secondary | ICD-10-CM | POA: Insufficient documentation

## 2014-03-18 DIAGNOSIS — O99513 Diseases of the respiratory system complicating pregnancy, third trimester: Secondary | ICD-10-CM | POA: Insufficient documentation

## 2014-03-18 DIAGNOSIS — O24113 Pre-existing diabetes mellitus, type 2, in pregnancy, third trimester: Secondary | ICD-10-CM | POA: Insufficient documentation

## 2014-03-18 DIAGNOSIS — Z3A24 24 weeks gestation of pregnancy: Secondary | ICD-10-CM

## 2014-03-18 DIAGNOSIS — O24913 Unspecified diabetes mellitus in pregnancy, third trimester: Secondary | ICD-10-CM

## 2014-03-18 NOTE — Consult Note (Signed)
Maternal Fetal Medicine Consultation  Requesting Provider(s): Baltazar Najjar, MD  Reason for consultation: Type 2 diabetes, co-management  HPI: [name] is a 29 yo G5P1031 currently at [place] 3d who was seen for consultation and co-management due to type 2 diabetes.  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  Since that time, the patient has had diabetes but has never previously required insulin.  Outside of pregnancy, she was taking Metformin.  She otherwise denies any known end-organ disease due to her pre gestational diabetes.  The patient was previously co-managed with MFM at [facility].  She had a normal anatomy scan at 24 weeks and has since been getting serial growth scans with her primary OB.  She is currently on Glyburide 1.25 mg every AM, 1.25 mg glyburide prior to lunch and 2.5 mg qHS.  The patient did not bring in her fingerstick logs, but reports that her fastings have been a "little elevated" and that her post prandial values at dinner have been elevated with some in the 180's-190's.  Her past OB history is remarkable for a prior C-section at 39 weeks due to fetal macrosomia.  She is without complaints today.  OB History: OB History    Gravida Para Term Preterm AB TAB SAB Ectopic Multiple Living   5 1 1  3 1 2   1       PMH:  Past Medical History  Diagnosis Date  . Asthma   . Pelvic inflammatory disease   . Pancreatitis   . Polycystic ovary syndrome   . Pancreatic cyst   . 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 Splenectomy, total  2012  . Pancreas surgery  2012    s/p partial pancreatectomy  . Cesarean section N/A 05/23/2012    Procedure: CESAREAN SECTION;  Surgeon: Lahoma Crocker, MD;  Location: [facility];  Service: Obstetrics;  Laterality: N/A;  primary   Meds:  Current Outpatient Prescriptions on File Prior  to Encounter  Medication Sig Dispense Refill  . acetaminophen (TYLENOL) 500 MG tablet Take 1,000 mg by mouth every 6 (six) hours as needed for pain.    Marland Kitchen albuterol (PROAIR HFA) 108 (90 BASE) MCG/ACT inhaler Inhale 2 puffs into the lungs every 6 (six) hours as needed for wheezing or shortness of breath.     . glyBURIDE (DIABETA) 1.25 MG tablet 1.25 mg 30 minutes before breakfast, 1.25 mg 30 minutes before lunch and 2.5 mg at bedtime. 120 tablet 2  . Prenat-FeCbn-FeAspGl-FA-Omega (OB COMPLETE PETITE) 35-5-1-200 MG CAPS Take 1 capsule by mouth daily. 30 capsule 11   No current facility-administered medications on file prior to encounter.   Allergies:  Allergies  Allergen Reactions  . Dilaudid [Hydromorphone Hcl] Itching and Other (See Comments)    hallucinations "Felt like she was losing her mind"   FH: Soc:  History   Social History  . Marital Status: Single    Spouse Name: N/A    Number of Children: 1  . Years of Education: N/A   Occupational History  . Equities trader   Social History Main Topics  . Smoking status: Current Every Day Smoker -- 0.50 packs/day for 13 years    Types: Cigarettes  . Smokeless tobacco: Never Used  . Alcohol Use: No  . Drug Use: No  . Sexual Activity:  Partners: Male    Museum/gallery curator: None   Other Topics Concern  . Not on file   Social History Narrative    Review of Systems: no vaginal bleeding or cramping/contractions, no LOF, no nausea/vomiting. All other systems reviewed and are negative.   PE:   Filed Vitals:   03/18/14 1524  BP: 107/64  Pulse: 82    A/P: 1) single IUP at 34w 3d         2) Type 2 diabetes (s/p partial pancreatectomy due to pseudocyst) - fingerstick logs not available for review.  With the patients post prandial values in the 180's-190's after dinner, will increase noon time glyburide to 2.5 mg for now.  She will likely need more adjustments, but will await review of fingerstick  logs first.  The patient is currently planning on a repeat C-section at 39 weeks - undecided whether or not she will deliver at [facility] (is considering delivery at [facility])         3) Asthma - well controlled off medications         4) Hx of previous C-section for fetal macrosomia         5) Smoking history   Recommendations: 1) Will begin 2x weekly NSTs with weekly AFIs.  Would recommend that one of the NSTs / week be performed with MFM and will review fingersticks at that time and make medication adjustments as needed. 2) Ultrasound for growth at ~ 36-37 weeks 3) Recommend delivery at 39 weeks in the absence of other complications (currently planning on having a scheduled repeat C-section)           Thank you for the opportunity to be a part of the care of [name]. Please contact our office if we can be of further assistance.   I spent approximately 30 minutes with this patient with over 50% of time spent in face-to-face counseling.  Benjaman Lobe, MD Maternal Fetal Medicine

## 2014-03-19 ENCOUNTER — Ambulatory Visit (INDEPENDENT_AMBULATORY_CARE_PROVIDER_SITE_OTHER): Payer: 59 | Admitting: Obstetrics

## 2014-03-19 ENCOUNTER — Other Ambulatory Visit: Payer: Self-pay | Admitting: Obstetrics

## 2014-03-19 ENCOUNTER — Ambulatory Visit (INDEPENDENT_AMBULATORY_CARE_PROVIDER_SITE_OTHER): Payer: Medicaid Other

## 2014-03-19 VITALS — BP 103/71 | HR 93 | Wt 163.0 lb

## 2014-03-19 DIAGNOSIS — Z3689 Encounter for other specified antenatal screening: Secondary | ICD-10-CM

## 2014-03-19 DIAGNOSIS — O24913 Unspecified diabetes mellitus in pregnancy, third trimester: Secondary | ICD-10-CM

## 2014-03-19 DIAGNOSIS — Z3483 Encounter for supervision of other normal pregnancy, third trimester: Secondary | ICD-10-CM

## 2014-03-19 LAB — POCT URINALYSIS DIPSTICK
Bilirubin, UA: NEGATIVE
Blood, UA: NEGATIVE
Glucose, UA: 250
Ketones, UA: NEGATIVE
Leukocytes, UA: NEGATIVE
Nitrite, UA: NEGATIVE
Protein, UA: NEGATIVE
Spec Grav, UA: 1.02
Urobilinogen, UA: NEGATIVE
pH, UA: 5.5

## 2014-03-19 LAB — US OB LIMITED

## 2014-03-19 NOTE — ED Notes (Signed)
Spoke with [name] at [facility] regarding patients need for NST's on fridays with their office.  The patient will be in their office today to set up those appointments.

## 2014-03-20 ENCOUNTER — Encounter (HOSPITAL_COMMUNITY): Payer: Self-pay | Admitting: Obstetrics & Gynecology

## 2014-03-20 ENCOUNTER — Other Ambulatory Visit: Payer: Self-pay | Admitting: *Deleted

## 2014-03-21 ENCOUNTER — Ambulatory Visit (HOSPITAL_COMMUNITY)
Admission: RE | Admit: 2014-03-21 | Discharge: 2014-03-21 | Disposition: A | Discharge status: Home / Self Care | Payer: Medicaid Other | Source: Ambulatory Visit | Attending: Obstetrics | Admitting: Obstetrics

## 2014-03-21 ENCOUNTER — Encounter (HOSPITAL_COMMUNITY): Payer: Self-pay

## 2014-03-21 DIAGNOSIS — Z3A24 24 weeks gestation of pregnancy: Secondary | ICD-10-CM | POA: Diagnosis not present

## 2014-03-21 DIAGNOSIS — E119 Type 2 diabetes mellitus without complications: Secondary | ICD-10-CM | POA: Diagnosis not present

## 2014-03-21 DIAGNOSIS — O24112 Pre-existing diabetes mellitus, type 2, in pregnancy, second trimester: Secondary | ICD-10-CM | POA: Insufficient documentation

## 2014-03-25 ENCOUNTER — Other Ambulatory Visit: Payer: Self-pay | Admitting: Obstetrics

## 2014-03-25 ENCOUNTER — Ambulatory Visit (HOSPITAL_COMMUNITY)
Admission: RE | Admit: 2014-03-25 | Discharge: 2014-03-25 | Disposition: A | Discharge status: Home / Self Care | Payer: Medicaid Other | Source: Ambulatory Visit | Attending: Family Medicine | Admitting: Family Medicine

## 2014-03-25 ENCOUNTER — Encounter: Payer: Self-pay | Admitting: Obstetrics

## 2014-03-25 ENCOUNTER — Ambulatory Visit (HOSPITAL_COMMUNITY)
Admission: RE | Admit: 2014-03-25 | Discharge: 2014-03-25 | Disposition: A | Discharge status: Home / Self Care | Payer: Medicaid Other | Source: Ambulatory Visit | Attending: Obstetrics | Admitting: Obstetrics

## 2014-03-25 DIAGNOSIS — Z3A35 35 weeks gestation of pregnancy: Secondary | ICD-10-CM | POA: Diagnosis not present

## 2014-03-25 DIAGNOSIS — O99333 Smoking (tobacco) complicating pregnancy, third trimester: Secondary | ICD-10-CM | POA: Diagnosis not present

## 2014-03-25 DIAGNOSIS — E119 Type 2 diabetes mellitus without complications: Secondary | ICD-10-CM | POA: Diagnosis not present

## 2014-03-25 DIAGNOSIS — F17210 Nicotine dependence, cigarettes, uncomplicated: Secondary | ICD-10-CM | POA: Diagnosis not present

## 2014-03-25 DIAGNOSIS — O24913 Unspecified diabetes mellitus in pregnancy, third trimester: Secondary | ICD-10-CM

## 2014-03-25 DIAGNOSIS — O3421 Maternal care for scar from previous cesarean delivery: Secondary | ICD-10-CM | POA: Insufficient documentation

## 2014-03-25 DIAGNOSIS — O24919 Unspecified diabetes mellitus in pregnancy, unspecified trimester: Secondary | ICD-10-CM | POA: Insufficient documentation

## 2014-03-25 DIAGNOSIS — Z3689 Encounter for other specified antenatal screening: Secondary | ICD-10-CM

## 2014-03-25 DIAGNOSIS — O24313 Unspecified pre-existing diabetes mellitus in pregnancy, third trimester: Secondary | ICD-10-CM | POA: Insufficient documentation

## 2014-03-25 NOTE — Progress Notes (Signed)
Subjective:    [name] is a 29 y.o. female being seen today for her obstetrical visit. She is at [redacted]w[redacted]d gestation. Patient reports no complaints. Fetal movement: normal.  Problem List Items Addressed This Visit    None    Visit Diagnoses    Encounter for supervision of other normal pregnancy in third trimester    -  Primary    Relevant Orders    POCT urinalysis dipstick (Completed)      Patient Active Problem List   Diagnosis Date Noted  . Diabetes mellitus in pregnancy in second trimester   . [redacted] weeks gestation of pregnancy   . Encounter for routine screening for malformation using ultrasonics   . 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 Vitamin D deficiency 12/29/2013  . H/O macrosomia in infant in prior pregnancy, currently pregnant 12/25/2013  . Previous cesarean delivery, antepartum 12/25/2013  . Tobacco use during pregnancy 12/25/2013  . Asthma 12/25/2013  . Pregnancy and non-insulin-dependent diabetes mellitus 05/22/2012  . Genital warts 12/22/2011  . Pancreatic pseudocyst 06/24/2010   Objective:    BP 103/71 mmHg  Pulse 93  Wt 163 lb (73.936 kg)  LMP 09/16/2013 FHT:  140 BPM  Uterine Size: size equals dates  Presentation: unsure     Assessment:    Pregnancy @ [redacted]w[redacted]d weeks   Plan:     labs reviewed, problem list updated Consent signed. GBS sent TDAP offered  Rhogam given for RH negative Pediatrician: discussed. Infant feeding: plans to breastfeed. Maternity leave: discussed. Cigarette smoking: smokes 0.5 PPD. Orders Placed This Encounter  Procedures  . POCT urinalysis dipstick   No orders of the defined types were placed in this encounter.   Follow up in 1 Week.

## 2014-03-26 ENCOUNTER — Other Ambulatory Visit: Payer: Medicaid Other

## 2014-03-26 ENCOUNTER — Other Ambulatory Visit (HOSPITAL_COMMUNITY): Payer: Self-pay

## 2014-03-27 ENCOUNTER — Ambulatory Visit (INDEPENDENT_AMBULATORY_CARE_PROVIDER_SITE_OTHER): Payer: 59 | Admitting: Obstetrics

## 2014-03-27 ENCOUNTER — Encounter: Payer: Self-pay | Admitting: Obstetrics

## 2014-03-27 VITALS — BP 106/71 | HR 89 | Temp 97.0°F | Wt 164.0 lb

## 2014-03-27 DIAGNOSIS — O24419 Gestational diabetes mellitus in pregnancy, unspecified control: Secondary | ICD-10-CM | POA: Diagnosis not present

## 2014-03-27 NOTE — Progress Notes (Signed)
Subjective:    [name] is a 29 y.o. female being seen today for her obstetrical visit. She is at [redacted]w[redacted]d gestation. Patient reports no complaints. Fetal movement: normal.  Problem List Items Addressed This Visit    None    Visit Diagnoses    Gestational diabetes mellitus in third trimester, unspecified diabetic control    -  Primary    Relevant Orders    POCT urinalysis dipstick    Fetal non-stress test      Patient Active Problem List   Diagnosis Date Noted  . [redacted] weeks gestation of pregnancy   . Diabetes mellitus in pregnancy, antepartum   . Diabetes mellitus in pregnancy in second trimester   . [redacted] weeks gestation of pregnancy   . Encounter for routine screening for malformation using ultrasonics   . 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 Vitamin D deficiency 12/29/2013  . H/O macrosomia in infant in prior pregnancy, currently pregnant 12/25/2013  . Previous cesarean delivery, antepartum 12/25/2013  . Tobacco use during pregnancy 12/25/2013  . Asthma 12/25/2013  . Pregnancy and non-insulin-dependent diabetes mellitus 05/22/2012  . Genital warts 12/22/2011  . Pancreatic pseudocyst 06/24/2010   Objective:    BP 106/71 mmHg  Pulse 89  Temp(Src) 97 F (36.1 C)  Wt 164 lb (74.39 kg)  LMP 09/16/2013 FHT:  140 BPM  Uterine Size: size equals dates  Presentation: unsure     Assessment:    Pregnancy @ [redacted]w[redacted]d weeks   Plan:     labs reviewed, problem list updated Consent signed. GBS sent TDAP offered  Rhogam given for RH negative Pediatrician: discussed. Infant feeding: plans to breastfeed. Maternity leave: discussed. Cigarette smoking: smokes 0.5 PPD. Orders Placed This Encounter  Procedures  . Fetal non-stress test    Standing Status: Standing     Number of Occurrences: 1     Standing Expiration Date:   . POCT urinalysis dipstick   No orders of the defined types were placed in this encounter.   Follow up in 1 Week.

## 2014-03-28 ENCOUNTER — Encounter: Payer: 59 | Admitting: Obstetrics

## 2014-04-01 ENCOUNTER — Ambulatory Visit (HOSPITAL_COMMUNITY)
Admission: RE | Admit: 2014-04-01 | Discharge: 2014-04-01 | Disposition: A | Discharge status: Home / Self Care | Payer: Medicaid Other | Source: Ambulatory Visit | Attending: Obstetrics | Admitting: Obstetrics

## 2014-04-01 ENCOUNTER — Ambulatory Visit (HOSPITAL_COMMUNITY): Payer: Medicaid Other

## 2014-04-01 ENCOUNTER — Other Ambulatory Visit (HOSPITAL_COMMUNITY): Payer: Medicaid Other

## 2014-04-01 DIAGNOSIS — O09293 Supervision of pregnancy with other poor reproductive or obstetric history, third trimester: Secondary | ICD-10-CM | POA: Insufficient documentation

## 2014-04-01 DIAGNOSIS — Z3A36 36 weeks gestation of pregnancy: Secondary | ICD-10-CM | POA: Insufficient documentation

## 2014-04-01 DIAGNOSIS — F17210 Nicotine dependence, cigarettes, uncomplicated: Secondary | ICD-10-CM | POA: Insufficient documentation

## 2014-04-01 DIAGNOSIS — O24113 Pre-existing diabetes mellitus, type 2, in pregnancy, third trimester: Secondary | ICD-10-CM | POA: Insufficient documentation

## 2014-04-01 DIAGNOSIS — E119 Type 2 diabetes mellitus without complications: Secondary | ICD-10-CM | POA: Diagnosis not present

## 2014-04-01 DIAGNOSIS — O99333 Smoking (tobacco) complicating pregnancy, third trimester: Secondary | ICD-10-CM | POA: Diagnosis not present

## 2014-04-01 DIAGNOSIS — Z3689 Encounter for other specified antenatal screening: Secondary | ICD-10-CM

## 2014-04-01 DIAGNOSIS — O24913 Unspecified diabetes mellitus in pregnancy, third trimester: Secondary | ICD-10-CM

## 2014-04-01 NOTE — ED Notes (Signed)
Pt forgot to bring sugars today.  States they have been about the same.  States she did have one high value but she relates it to drinking lemonade.

## 2014-04-02 ENCOUNTER — Other Ambulatory Visit: Payer: Medicaid Other

## 2014-04-04 ENCOUNTER — Encounter: Payer: Self-pay | Admitting: Obstetrics

## 2014-04-04 ENCOUNTER — Ambulatory Visit (INDEPENDENT_AMBULATORY_CARE_PROVIDER_SITE_OTHER): Payer: 59 | Admitting: Obstetrics

## 2014-04-04 VITALS — BP 120/76 | HR 102 | Temp 98.1°F | Wt 164.0 lb

## 2014-04-04 DIAGNOSIS — O24913 Unspecified diabetes mellitus in pregnancy, third trimester: Secondary | ICD-10-CM

## 2014-04-04 DIAGNOSIS — O09893 Supervision of other high risk pregnancies, third trimester: Secondary | ICD-10-CM

## 2014-04-04 LAB — POCT URINALYSIS DIPSTICK
Bilirubin, UA: NEGATIVE
Blood, UA: NEGATIVE
GLUCOSE UA: 1000
NITRITE UA: NEGATIVE
Protein, UA: NEGATIVE
Spec Grav, UA: 1.015
Urobilinogen, UA: NEGATIVE
pH, UA: 6

## 2014-04-04 NOTE — Progress Notes (Signed)
Subjective:    [name] is a 29 y.o. female being seen today for her obstetrical visit. She is at [redacted]w[redacted]d gestation. Patient reports no complaints. Fetal movement: normal.  Problem List Items Addressed This Visit    None    Visit Diagnoses    Supervision of other high-risk pregnancy, third trimester    -  Primary    Relevant Orders    POCT urinalysis dipstick (Completed)      Patient Active Problem List   Diagnosis Date Noted  . [redacted] weeks gestation of pregnancy   . [redacted] weeks gestation of pregnancy   . Diabetes mellitus in pregnancy, antepartum   . Diabetes mellitus in pregnancy in second trimester   . [redacted] weeks gestation of pregnancy   . Encounter for routine screening for malformation using ultrasonics   . 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 Vitamin D deficiency 12/29/2013  . H/O macrosomia in infant in prior pregnancy, currently pregnant 12/25/2013  . Previous cesarean delivery, antepartum 12/25/2013  . Tobacco use during pregnancy 12/25/2013  . Asthma 12/25/2013  . Pregnancy and non-insulin-dependent diabetes mellitus 05/22/2012  . Genital warts 12/22/2011  . Pancreatic pseudocyst 06/24/2010   Objective:    BP 120/76 mmHg  Pulse 102  Temp(Src) 98.1 F (36.7 C)  Wt 164 lb (74.39 kg)  LMP 09/16/2013 FHT:  140 BPM  Uterine Size: size equals dates  Presentation: unsure    NST:  Reactive  Assessment:    Pregnancy @ [redacted]w[redacted]d weeks   Plan:     labs reviewed, problem list updated Consent signed. GBS sent TDAP offered  Rhogam given for RH negative Pediatrician: discussed. Infant feeding: plans to breastfeed. Maternity leave: discussed. Cigarette smoking: smokes 0.5 PPD. Orders Placed This Encounter  Procedures  . POCT urinalysis dipstick   No orders of the defined types were placed in this encounter.   Follow up in 1 Week.

## 2014-04-08 ENCOUNTER — Encounter (HOSPITAL_COMMUNITY): Payer: Self-pay

## 2014-04-08 ENCOUNTER — Ambulatory Visit (HOSPITAL_COMMUNITY)
Admission: RE | Admit: 2014-04-08 | Discharge: 2014-04-08 | Disposition: A | Discharge status: Home / Self Care | Payer: Medicaid Other | Source: Ambulatory Visit | Attending: Obstetrics & Gynecology | Admitting: Obstetrics & Gynecology

## 2014-04-08 ENCOUNTER — Other Ambulatory Visit (HOSPITAL_COMMUNITY): Payer: Medicaid Other

## 2014-04-08 ENCOUNTER — Ambulatory Visit (HOSPITAL_COMMUNITY)
Admission: RE | Admit: 2014-04-08 | Discharge: 2014-04-08 | Disposition: A | Discharge status: Home / Self Care | Payer: Medicaid Other | Source: Ambulatory Visit | Attending: Obstetrics | Admitting: Obstetrics

## 2014-04-08 VITALS — BP 99/71 | HR 92 | Wt 164.0 lb

## 2014-04-08 DIAGNOSIS — Z3A37 37 weeks gestation of pregnancy: Secondary | ICD-10-CM | POA: Diagnosis not present

## 2014-04-08 DIAGNOSIS — O09292 Supervision of pregnancy with other poor reproductive or obstetric history, second trimester: Secondary | ICD-10-CM

## 2014-04-08 DIAGNOSIS — O99332 Smoking (tobacco) complicating pregnancy, second trimester: Secondary | ICD-10-CM

## 2014-04-08 DIAGNOSIS — F17210 Nicotine dependence, cigarettes, uncomplicated: Secondary | ICD-10-CM | POA: Diagnosis not present

## 2014-04-08 DIAGNOSIS — J4520 Mild intermittent asthma, uncomplicated: Secondary | ICD-10-CM

## 2014-04-08 DIAGNOSIS — O24913 Unspecified diabetes mellitus in pregnancy, third trimester: Secondary | ICD-10-CM

## 2014-04-08 DIAGNOSIS — O3421 Maternal care for scar from previous cesarean delivery: Secondary | ICD-10-CM | POA: Insufficient documentation

## 2014-04-08 DIAGNOSIS — O24113 Pre-existing diabetes mellitus, type 2, in pregnancy, third trimester: Secondary | ICD-10-CM | POA: Insufficient documentation

## 2014-04-08 DIAGNOSIS — E119 Type 2 diabetes mellitus without complications: Secondary | ICD-10-CM | POA: Diagnosis not present

## 2014-04-08 DIAGNOSIS — D573 Sickle-cell trait: Secondary | ICD-10-CM

## 2014-04-08 DIAGNOSIS — O99333 Smoking (tobacco) complicating pregnancy, third trimester: Secondary | ICD-10-CM | POA: Diagnosis not present

## 2014-04-08 DIAGNOSIS — E559 Vitamin D deficiency, unspecified: Secondary | ICD-10-CM

## 2014-04-08 DIAGNOSIS — O34219 Maternal care for unspecified type scar from previous cesarean delivery: Secondary | ICD-10-CM

## 2014-04-09 ENCOUNTER — Other Ambulatory Visit (HOSPITAL_COMMUNITY): Payer: Self-pay

## 2014-04-09 ENCOUNTER — Other Ambulatory Visit: Payer: Medicaid Other

## 2014-04-10 ENCOUNTER — Encounter: Payer: Self-pay | Admitting: Obstetrics

## 2014-04-10 ENCOUNTER — Ambulatory Visit (INDEPENDENT_AMBULATORY_CARE_PROVIDER_SITE_OTHER): Payer: Medicaid Other | Admitting: Obstetrics

## 2014-04-10 VITALS — BP 114/77 | HR 103 | Temp 97.4°F | Wt 164.0 lb

## 2014-04-10 DIAGNOSIS — O24419 Gestational diabetes mellitus in pregnancy, unspecified control: Secondary | ICD-10-CM | POA: Diagnosis not present

## 2014-04-10 LAB — POCT URINALYSIS DIPSTICK
Bilirubin, UA: NEGATIVE
Glucose, UA: 50
Ketones, UA: NEGATIVE
Nitrite, UA: NEGATIVE
PH UA: 6
RBC UA: NEGATIVE
Spec Grav, UA: 1.02
UROBILINOGEN UA: NEGATIVE

## 2014-04-10 NOTE — Progress Notes (Signed)
Subjective:    [name] is a 29 y.o. female being seen today for her obstetrical visit. She is at [redacted]w[redacted]d gestation. Patient reports no complaints. Fetal movement: normal.  Problem List Items Addressed This Visit    None    Visit Diagnoses    Diet controlled gestational diabetes mellitus in third trimester    -  Primary    Relevant Orders    POCT urinalysis dipstick (Completed)    Fetal non-stress test      Patient Active Problem List   Diagnosis Date Noted  . [redacted] weeks gestation of pregnancy   . [redacted] weeks gestation of pregnancy   . [redacted] weeks gestation of pregnancy   . Diabetes mellitus in pregnancy, antepartum   . Diabetes mellitus in pregnancy in second trimester   . [redacted] weeks gestation of pregnancy   . Encounter for routine screening for malformation using ultrasonics   . 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 Vitamin D deficiency 12/29/2013  . H/O macrosomia in infant in prior pregnancy, currently pregnant 12/25/2013  . Previous cesarean delivery, antepartum 12/25/2013  . Tobacco use during pregnancy 12/25/2013  . Asthma 12/25/2013  . Pregnancy and non-insulin-dependent diabetes mellitus 05/22/2012  . Genital warts 12/22/2011  . Pancreatic pseudocyst 06/24/2010    Objective:    BP 114/77 mmHg  Pulse 103  Temp(Src) 97.4 F (36.3 C)  Wt 164 lb (74.39 kg)  LMP 09/16/2013 FHT: 140 BPM  Uterine Size: size equals dates  Presentations: unsure  Pelvic Exam: Deferred    Assessment:    Pregnancy @ [redacted]w[redacted]d weeks   Plan:   Plans for delivery: Vaginal anticipated; labs reviewed; problem list updated Counseling: Consent signed. Infant feeding: plans to breastfeed. Cigarette smoking: smokes 0.5 PPD. L&D discussion: symptoms of labor, discussed when to call, discussed what number to call, anesthetic/analgesic options reviewed and delivering clinician:  plans Physician. Postpartum supports and preparation: circumcision discussed and contraception plans discussed.  Follow  up in 1 Week.

## 2014-04-11 ENCOUNTER — Telehealth: Payer: Self-pay

## 2014-04-11 NOTE — Telephone Encounter (Signed)
Disability paperwork ready for pick up, she has paid the 15.00

## 2014-04-15 ENCOUNTER — Ambulatory Visit (HOSPITAL_COMMUNITY)
Admission: RE | Admit: 2014-04-15 | Discharge: 2014-04-15 | Disposition: A | Discharge status: Home / Self Care | Payer: Medicaid Other | Source: Ambulatory Visit | Attending: Obstetrics | Admitting: Obstetrics

## 2014-04-15 ENCOUNTER — Other Ambulatory Visit: Payer: Self-pay | Admitting: Obstetrics & Gynecology

## 2014-04-15 ENCOUNTER — Ambulatory Visit (HOSPITAL_COMMUNITY)
Admission: RE | Admit: 2014-04-15 | Discharge: 2014-04-15 | Disposition: A | Discharge status: Home / Self Care | Payer: Medicaid Other | Source: Ambulatory Visit | Attending: Family Medicine | Admitting: Family Medicine

## 2014-04-15 ENCOUNTER — Encounter (HOSPITAL_COMMUNITY): Payer: Self-pay

## 2014-04-15 DIAGNOSIS — O3421 Maternal care for scar from previous cesarean delivery: Secondary | ICD-10-CM | POA: Insufficient documentation

## 2014-04-15 DIAGNOSIS — O24913 Unspecified diabetes mellitus in pregnancy, third trimester: Secondary | ICD-10-CM

## 2014-04-15 DIAGNOSIS — O99333 Smoking (tobacco) complicating pregnancy, third trimester: Secondary | ICD-10-CM | POA: Diagnosis not present

## 2014-04-15 DIAGNOSIS — O24113 Pre-existing diabetes mellitus, type 2, in pregnancy, third trimester: Secondary | ICD-10-CM | POA: Insufficient documentation

## 2014-04-15 DIAGNOSIS — E119 Type 2 diabetes mellitus without complications: Secondary | ICD-10-CM | POA: Diagnosis not present

## 2014-04-15 DIAGNOSIS — F17210 Nicotine dependence, cigarettes, uncomplicated: Secondary | ICD-10-CM | POA: Insufficient documentation

## 2014-04-15 DIAGNOSIS — Z3A38 38 weeks gestation of pregnancy: Secondary | ICD-10-CM | POA: Insufficient documentation

## 2014-04-15 DIAGNOSIS — O09293 Supervision of pregnancy with other poor reproductive or obstetric history, third trimester: Secondary | ICD-10-CM | POA: Diagnosis not present

## 2014-04-15 NOTE — ED Notes (Signed)
Pt did not bring blood sugars today.  States that her sugars are fine as long as she sticks to the diet.

## 2014-04-17 ENCOUNTER — Ambulatory Visit (INDEPENDENT_AMBULATORY_CARE_PROVIDER_SITE_OTHER): Payer: 59 | Admitting: Obstetrics

## 2014-04-17 ENCOUNTER — Encounter (HOSPITAL_COMMUNITY): Payer: Self-pay

## 2014-04-17 ENCOUNTER — Encounter (HOSPITAL_COMMUNITY)
Admission: RE | Admit: 2014-04-17 | Discharge: 2014-04-17 | Disposition: A | Discharge status: Home / Self Care | Payer: Medicaid Other | Source: Ambulatory Visit | Attending: Obstetrics | Admitting: Obstetrics

## 2014-04-17 ENCOUNTER — Encounter: Payer: 59 | Admitting: Obstetrics

## 2014-04-17 ENCOUNTER — Encounter: Payer: Self-pay | Admitting: Obstetrics

## 2014-04-17 VITALS — BP 105/71 | HR 81 | Temp 98.5°F | Wt 165.0 lb

## 2014-04-17 VITALS — BP 101/77 | HR 83 | Resp 18 | Ht 60.0 in | Wt 169.0 lb

## 2014-04-17 DIAGNOSIS — O34219 Maternal care for unspecified type scar from previous cesarean delivery: Secondary | ICD-10-CM

## 2014-04-17 DIAGNOSIS — D573 Sickle-cell trait: Secondary | ICD-10-CM

## 2014-04-17 DIAGNOSIS — Z3483 Encounter for supervision of other normal pregnancy, third trimester: Secondary | ICD-10-CM

## 2014-04-17 DIAGNOSIS — E559 Vitamin D deficiency, unspecified: Secondary | ICD-10-CM

## 2014-04-17 HISTORY — DX: Bipolar disorder, unspecified: F31.9

## 2014-04-17 HISTORY — DX: Missed abortion: O02.1

## 2014-04-17 HISTORY — DX: Bronchitis, not specified as acute or chronic: J40

## 2014-04-17 HISTORY — DX: Anemia, unspecified: D64.9

## 2014-04-17 LAB — CBC
HCT: 30.6 % — ABNORMAL LOW (ref 36.0–46.0)
Hemoglobin: 10.2 g/dL — ABNORMAL LOW (ref 12.0–15.0)
MCH: 22.2 pg — AB (ref 26.0–34.0)
MCHC: 33.3 g/dL (ref 30.0–36.0)
MCV: 66.5 fL — AB (ref 78.0–100.0)
PLATELETS: 449 10*3/uL — AB (ref 150–400)
RBC: 4.6 MIL/uL (ref 3.87–5.11)
RDW: 14.1 % (ref 11.5–15.5)
WBC: 6.6 10*3/uL (ref 4.0–10.5)

## 2014-04-17 LAB — POCT URINALYSIS DIPSTICK
BILIRUBIN UA: NEGATIVE
Blood, UA: NEGATIVE
GLUCOSE UA: NEGATIVE
Leukocytes, UA: NEGATIVE
NITRITE UA: NEGATIVE
Protein, UA: NEGATIVE
Spec Grav, UA: 1.02
UROBILINOGEN UA: NEGATIVE
pH, UA: 6

## 2014-04-17 LAB — BASIC METABOLIC PANEL
Anion gap: 8 (ref 5–15)
BUN: 6 mg/dL (ref 6–23)
CHLORIDE: 106 mmol/L (ref 96–112)
CO2: 21 mmol/L (ref 19–32)
CREATININE: 0.52 mg/dL (ref 0.50–1.10)
Calcium: 8.4 mg/dL (ref 8.4–10.5)
GFR calc non Af Amer: >90 mL/min (ref >90)
Glucose, Bld: 85 mg/dL (ref 70–99)
Potassium: 3.5 mmol/L (ref 3.5–5.1)
SODIUM: 135 mmol/L (ref 135–145)

## 2014-04-17 LAB — TYPE AND SCREEN
ABO/RH(D): B POS
ANTIBODY SCREEN: NEGATIVE

## 2014-04-17 NOTE — Patient Instructions (Addendum)
   Your procedure is scheduled on: Friday, Feb 12   Enter through the [employer] [facility] at: 6 AM Pick up the phone at the desk and dial [phone] and inform us of your arrival.  Please call this number if you have any problems the morning of surgery: [phone]  Remember: Do not eat or drink after midnight: Tonight - Thursday Take these medicines the morning of surgery with a SIP OF WATER: None.  Bring albuterol inhaler with you on day of surgery.   Do not take diabetes medication on Friday, day of surgery.  We will check your sugar level upon arrival to Short Stay Dept.  Do not wear jewelry, make-up, or FINGER nail polish No metal in your hair or on your body. Do not wear lotions, powders, perfumes.  You may wear deodorant.  Do not bring valuables to the hospital. Contacts, dentures or bridgework may not be worn into surgery.  Leave suitcase in the car. After Surgery it may be brought to your room. For patients being admitted to the hospital, checkout time is 11:00am the day of discharge.  Home with fiance Brandon cell [phone]

## 2014-04-17 NOTE — Anesthesia Preprocedure Evaluation (Signed)
Anesthesia Evaluation  Patient identified by MRN, date of birth, ID band Patient awake    Reviewed: Allergy & Precautions, NPO status , Patient's Chart, lab work & pertinent test results  History of Anesthesia Complications Negative for: history of anesthetic complications  Airway Mallampati: II  TM Distance: >3 FB Neck ROM: Full    Dental no notable dental hx. (+) Dental Advisory Given   Pulmonary asthma , Current Smoker,  breath sounds clear to auscultation  Pulmonary exam normal       Cardiovascular negative cardio ROS  Rhythm:Regular Rate:Normal     Neuro/Psych PSYCHIATRIC DISORDERS Bipolar Disorder negative neurological ROS     GI/Hepatic negative GI ROS, Neg liver ROS,   Endo/Other  diabetes, Type 2, Oral Hypoglycemic Agentsobesity  Renal/GU negative Renal ROS  negative genitourinary   Musculoskeletal negative musculoskeletal ROS (+)   Abdominal   Peds negative pediatric ROS (+)  Hematology  (+) anemia ,   Anesthesia Other Findings   Reproductive/Obstetrics (+) Pregnancy                             Anesthesia Physical Anesthesia Plan  ASA: III  Anesthesia Plan: Spinal   Post-op Pain Management:    Induction:   Airway Management Planned:   Additional Equipment:   Intra-op Plan:   Post-operative Plan:   Informed Consent: I have reviewed the patients History and Physical, chart, labs and discussed the procedure including the risks, benefits and alternatives for the proposed anesthesia with the patient or authorized representative who has indicated his/her understanding and acceptance.   Dental advisory given  Plan Discussed with:   Anesthesia Plan Comments:         Anesthesia Quick Evaluation

## 2014-04-17 NOTE — Progress Notes (Signed)
Subjective:    [name] is a 29 y.o. female being seen today for her obstetrical visit. She is at [redacted]w[redacted]d gestation. Patient reports no complaints. Fetal movement: normal.  Problem List Items Addressed This Visit    None    Visit Diagnoses    Encounter for supervision of other normal pregnancy in third trimester    -  Primary    Relevant Orders    POCT urinalysis dipstick (Completed)      Patient Active Problem List   Diagnosis Date Noted  . [redacted] weeks gestation of pregnancy   . [redacted] weeks gestation of pregnancy   . [redacted] weeks gestation of pregnancy   . [redacted] weeks gestation of pregnancy   . Diabetes mellitus in pregnancy, antepartum   . Diabetes mellitus in pregnancy in second trimester   . [redacted] weeks gestation of pregnancy   . Encounter for routine screening for malformation using ultrasonics   . 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 Vitamin D deficiency 12/29/2013  . H/O macrosomia in infant in prior pregnancy, currently pregnant 12/25/2013  . Previous cesarean delivery, antepartum 12/25/2013  . Tobacco use during pregnancy 12/25/2013  . Asthma 12/25/2013  . Pregnancy and non-insulin-dependent diabetes mellitus 05/22/2012  . Genital warts 12/22/2011  . Pancreatic pseudocyst 06/24/2010    Objective:    BP 105/71 mmHg  Pulse 81  Temp(Src) 98.5 F (36.9 C)  Wt 165 lb (74.844 kg)  LMP 09/16/2013 FHT: 140 BPM  Uterine Size: size greater than dates  Presentations: unsure  Pelvic Exam: Deferred    Assessment:    Pregnancy @ [redacted]w[redacted]d weeks   Plan:   Plans for delivery: C/Section scheduled; labs reviewed; problem list updated Counseling: Consent signed. Infant feeding: plans to breastfeed. Cigarette smoking: smokes 0.5 PPD. L&D discussion: symptoms of labor, discussed when to call, discussed what number to call, anesthetic/analgesic options reviewed and delivering clinician:  plans Physician. Postpartum supports and preparation: circumcision discussed and contraception plans  discussed.  Follow up in 2 weeks postpartum.

## 2014-04-18 ENCOUNTER — Encounter (HOSPITAL_COMMUNITY)
Admission: RE | Disposition: A | Discharge status: Home / Self Care | Payer: Self-pay | Source: Ambulatory Visit | Attending: Obstetrics

## 2014-04-18 ENCOUNTER — Encounter (HOSPITAL_COMMUNITY): Payer: Self-pay | Admitting: *Deleted

## 2014-04-18 ENCOUNTER — Inpatient Hospital Stay (HOSPITAL_COMMUNITY)
Admission: RE | Admit: 2014-04-18 | Discharge: 2014-04-21 | DRG: 765 | Disposition: A | Discharge status: Home / Self Care | Payer: Medicaid Other | Source: Ambulatory Visit | Attending: Obstetrics | Admitting: Obstetrics

## 2014-04-18 ENCOUNTER — Inpatient Hospital Stay (HOSPITAL_COMMUNITY): Payer: Medicaid Other | Admitting: Anesthesiology

## 2014-04-18 DIAGNOSIS — O2412 Pre-existing diabetes mellitus, type 2, in childbirth: Secondary | ICD-10-CM | POA: Diagnosis present

## 2014-04-18 DIAGNOSIS — O3663X0 Maternal care for excessive fetal growth, third trimester, not applicable or unspecified: Secondary | ICD-10-CM | POA: Diagnosis present

## 2014-04-18 DIAGNOSIS — D573 Sickle-cell trait: Secondary | ICD-10-CM

## 2014-04-18 DIAGNOSIS — Z3483 Encounter for supervision of other normal pregnancy, third trimester: Secondary | ICD-10-CM | POA: Diagnosis present

## 2014-04-18 DIAGNOSIS — Z98891 History of uterine scar from previous surgery: Secondary | ICD-10-CM

## 2014-04-18 DIAGNOSIS — E119 Type 2 diabetes mellitus without complications: Secondary | ICD-10-CM | POA: Diagnosis present

## 2014-04-18 DIAGNOSIS — O3421 Maternal care for scar from previous cesarean delivery: Secondary | ICD-10-CM | POA: Diagnosis present

## 2014-04-18 DIAGNOSIS — E559 Vitamin D deficiency, unspecified: Secondary | ICD-10-CM

## 2014-04-18 DIAGNOSIS — Z3A39 39 weeks gestation of pregnancy: Secondary | ICD-10-CM | POA: Diagnosis present

## 2014-04-18 DIAGNOSIS — O34219 Maternal care for unspecified type scar from previous cesarean delivery: Secondary | ICD-10-CM

## 2014-04-18 LAB — RPR: RPR Ser Ql: NONREACTIVE

## 2014-04-18 LAB — GLUCOSE, CAPILLARY
GLUCOSE-CAPILLARY: 111 mg/dL — AB (ref 70–99)
GLUCOSE-CAPILLARY: 162 mg/dL — AB (ref 70–99)
Glucose-Capillary: 114 mg/dL — ABNORMAL HIGH (ref 70–99)
Glucose-Capillary: 150 mg/dL — ABNORMAL HIGH (ref 70–99)

## 2014-04-18 SURGERY — Surgical Case
Anesthesia: Spinal

## 2014-04-18 MED ORDER — PHENYLEPHRINE 8 MG IN D5W 100 ML (0.08MG/ML) PREMIX OPTIME
INJECTION | INTRAVENOUS | Status: AC
  Filled 2014-04-18: qty 100

## 2014-04-18 MED ORDER — SCOPOLAMINE 1 MG/3DAYS TD PT72
1.0000 | MEDICATED_PATCH | Freq: Once | TRANSDERMAL | Status: DC
Start: 2014-04-18 — End: 2014-04-18
  Administered 2014-04-18: 1.5 mg via TRANSDERMAL

## 2014-04-18 MED ORDER — FENTANYL CITRATE 0.05 MG/ML IJ SOLN
INTRAMUSCULAR | Status: AC
  Filled 2014-04-18: qty 2

## 2014-04-18 MED ORDER — DEXAMETHASONE SODIUM PHOSPHATE 10 MG/ML IJ SOLN
INTRAMUSCULAR | Status: AC
  Filled 2014-04-18: qty 1

## 2014-04-18 MED ORDER — DIPHENHYDRAMINE HCL 25 MG PO CAPS
25.0000 mg | ORAL_CAPSULE | ORAL | Status: DC | PRN
  Administered 2014-04-18: 25 mg via ORAL
  Filled 2014-04-18: qty 1

## 2014-04-18 MED ORDER — MORPHINE SULFATE 0.5 MG/ML IJ SOLN
INTRAMUSCULAR | Status: AC
  Filled 2014-04-18: qty 10

## 2014-04-18 MED ORDER — ACETAMINOPHEN 500 MG PO TABS
1000.0000 mg | ORAL_TABLET | Freq: Four times a day (QID) | ORAL | Status: AC
  Administered 2014-04-18 – 2014-04-19 (×3): 1000 mg via ORAL
  Filled 2014-04-18 (×4): qty 2

## 2014-04-18 MED ORDER — OXYTOCIN 40 UNITS IN LACTATED RINGERS INFUSION - SIMPLE MED: 62.5000 mL/h | INTRAVENOUS | Status: AC

## 2014-04-18 MED ORDER — GLYBURIDE 1.25 MG PO TABS: 1.2500 mg | ORAL_TABLET | ORAL | Status: DC

## 2014-04-18 MED ORDER — ZOLPIDEM TARTRATE 5 MG PO TABS: 5.0000 mg | ORAL_TABLET | Freq: Every evening | ORAL | Status: DC | PRN

## 2014-04-18 MED ORDER — PHENYLEPHRINE 8 MG IN D5W 100 ML (0.08MG/ML) PREMIX OPTIME
INJECTION | INTRAVENOUS | Status: DC | PRN
  Administered 2014-04-18: 60 ug/min via INTRAVENOUS

## 2014-04-18 MED ORDER — NALBUPHINE HCL 10 MG/ML IJ SOLN: 5.0000 mg | INTRAMUSCULAR | Status: DC | PRN

## 2014-04-18 MED ORDER — LANOLIN HYDROUS EX OINT: 1.0000 "application " | TOPICAL_OINTMENT | CUTANEOUS | Status: DC | PRN

## 2014-04-18 MED ORDER — ONDANSETRON HCL 4 MG PO TABS: 4.0000 mg | ORAL_TABLET | ORAL | Status: DC | PRN

## 2014-04-18 MED ORDER — ONDANSETRON HCL 4 MG/2ML IJ SOLN: 4.0000 mg | Freq: Three times a day (TID) | INTRAMUSCULAR | Status: DC | PRN

## 2014-04-18 MED ORDER — MENTHOL 3 MG MT LOZG: 1.0000 | LOZENGE | OROMUCOSAL | Status: DC | PRN

## 2014-04-18 MED ORDER — CEFAZOLIN SODIUM-DEXTROSE 2-3 GM-% IV SOLR
2.0000 g | INTRAVENOUS | Status: AC
  Administered 2014-04-18: 2 g via INTRAVENOUS

## 2014-04-18 MED ORDER — KETOROLAC TROMETHAMINE 30 MG/ML IJ SOLN
30.0000 mg | Freq: Four times a day (QID) | INTRAMUSCULAR | Status: DC | PRN
  Administered 2014-04-18: 30 mg via INTRAMUSCULAR

## 2014-04-18 MED ORDER — LACTATED RINGERS IV SOLN: INTRAVENOUS | Status: DC

## 2014-04-18 MED ORDER — FENTANYL CITRATE 0.05 MG/ML IJ SOLN
25.0000 ug | INTRAMUSCULAR | Status: DC | PRN
  Administered 2014-04-18 (×2): 25 ug via INTRAVENOUS

## 2014-04-18 MED ORDER — ONDANSETRON HCL 4 MG/2ML IJ SOLN
INTRAMUSCULAR | Status: AC
  Filled 2014-04-18: qty 2

## 2014-04-18 MED ORDER — GLYBURIDE 2.5 MG PO TABS
2.5000 mg | ORAL_TABLET | Freq: Every day | ORAL | Status: DC
Start: 2014-04-19 — End: 2014-04-20
  Administered 2014-04-19: 2.5 mg via ORAL
  Filled 2014-04-18 (×4): qty 1

## 2014-04-18 MED ORDER — DIPHENHYDRAMINE HCL 50 MG/ML IJ SOLN
12.5000 mg | INTRAMUSCULAR | Status: DC | PRN
  Administered 2014-04-18 (×2): 12.5 mg via INTRAVENOUS
  Filled 2014-04-18 (×2): qty 1

## 2014-04-18 MED ORDER — SCOPOLAMINE 1 MG/3DAYS TD PT72
MEDICATED_PATCH | TRANSDERMAL | Status: AC
  Administered 2014-04-18: 1.5 mg via TRANSDERMAL
  Filled 2014-04-18: qty 1

## 2014-04-18 MED ORDER — FENTANYL CITRATE 0.05 MG/ML IJ SOLN
INTRAMUSCULAR | Status: DC | PRN
  Administered 2014-04-18: 10 ug via INTRATHECAL

## 2014-04-18 MED ORDER — NALBUPHINE HCL 10 MG/ML IJ SOLN
INTRAMUSCULAR | Status: AC
  Administered 2014-04-18: 5 mg via SUBCUTANEOUS
  Filled 2014-04-18: qty 1

## 2014-04-18 MED ORDER — SODIUM CHLORIDE 0.9 % IJ SOLN: 3.0000 mL | INTRAMUSCULAR | Status: DC | PRN

## 2014-04-18 MED ORDER — DEXAMETHASONE SODIUM PHOSPHATE 10 MG/ML IJ SOLN
INTRAMUSCULAR | Status: DC | PRN
  Administered 2014-04-18: 5 mg via INTRAVENOUS

## 2014-04-18 MED ORDER — SCOPOLAMINE 1 MG/3DAYS TD PT72
1.0000 | MEDICATED_PATCH | Freq: Once | TRANSDERMAL | Status: DC
  Filled 2014-04-18: qty 1

## 2014-04-18 MED ORDER — 0.9 % SODIUM CHLORIDE (POUR BTL) OPTIME
TOPICAL | Status: DC | PRN
  Administered 2014-04-18: 1000 mL

## 2014-04-18 MED ORDER — ALBUTEROL SULFATE HFA 108 (90 BASE) MCG/ACT IN AERS: 2.0000 | INHALATION_SPRAY | Freq: Four times a day (QID) | RESPIRATORY_TRACT | Status: DC | PRN

## 2014-04-18 MED ORDER — CEFAZOLIN SODIUM-DEXTROSE 2-3 GM-% IV SOLR
INTRAVENOUS | Status: AC
  Filled 2014-04-18: qty 50

## 2014-04-18 MED ORDER — ONDANSETRON HCL 4 MG/2ML IJ SOLN
INTRAMUSCULAR | Status: DC | PRN
  Administered 2014-04-18: 4 mg via INTRAVENOUS

## 2014-04-18 MED ORDER — OXYCODONE-ACETAMINOPHEN 5-325 MG PO TABS
2.0000 | ORAL_TABLET | ORAL | Status: DC | PRN
  Administered 2014-04-18 – 2014-04-21 (×6): 2 via ORAL
  Filled 2014-04-18 (×7): qty 2

## 2014-04-18 MED ORDER — DIBUCAINE 1 % RE OINT: 1.0000 "application " | TOPICAL_OINTMENT | RECTAL | Status: DC | PRN

## 2014-04-18 MED ORDER — KETOROLAC TROMETHAMINE 30 MG/ML IJ SOLN
INTRAMUSCULAR | Status: AC
  Filled 2014-04-18: qty 1

## 2014-04-18 MED ORDER — IBUPROFEN 600 MG PO TABS
600.0000 mg | ORAL_TABLET | Freq: Four times a day (QID) | ORAL | Status: DC
  Administered 2014-04-18 – 2014-04-21 (×12): 600 mg via ORAL
  Filled 2014-04-18 (×12): qty 1

## 2014-04-18 MED ORDER — BUPIVACAINE IN DEXTROSE 0.75-8.25 % IT SOLN
INTRATHECAL | Status: DC | PRN
  Administered 2014-04-18: 1.6 mL via INTRATHECAL

## 2014-04-18 MED ORDER — WITCH HAZEL-GLYCERIN EX PADS: 1.0000 "application " | MEDICATED_PAD | CUTANEOUS | Status: DC | PRN

## 2014-04-18 MED ORDER — ONDANSETRON HCL 4 MG/2ML IJ SOLN: 4.0000 mg | Freq: Once | INTRAMUSCULAR | Status: DC | PRN

## 2014-04-18 MED ORDER — ALBUTEROL SULFATE (2.5 MG/3ML) 0.083% IN NEBU: 2.5000 mg | INHALATION_SOLUTION | Freq: Four times a day (QID) | RESPIRATORY_TRACT | Status: DC | PRN

## 2014-04-18 MED ORDER — OXYTOCIN 10 UNIT/ML IJ SOLN
INTRAMUSCULAR | Status: AC
  Filled 2014-04-18: qty 4

## 2014-04-18 MED ORDER — SIMETHICONE 80 MG PO CHEW
80.0000 mg | CHEWABLE_TABLET | ORAL | Status: DC
  Administered 2014-04-19 – 2014-04-20 (×3): 80 mg via ORAL
  Filled 2014-04-18 (×2): qty 1

## 2014-04-18 MED ORDER — SIMETHICONE 80 MG PO CHEW: 80.0000 mg | CHEWABLE_TABLET | ORAL | Status: DC | PRN

## 2014-04-18 MED ORDER — TETANUS-DIPHTH-ACELL PERTUSSIS 5-2.5-18.5 LF-MCG/0.5 IM SUSP: 0.5000 mL | Freq: Once | INTRAMUSCULAR | Status: DC

## 2014-04-18 MED ORDER — SENNOSIDES-DOCUSATE SODIUM 8.6-50 MG PO TABS
2.0000 | ORAL_TABLET | ORAL | Status: DC
  Administered 2014-04-19 – 2014-04-20 (×3): 2 via ORAL
  Filled 2014-04-18 (×3): qty 2

## 2014-04-18 MED ORDER — MORPHINE SULFATE (PF) 0.5 MG/ML IJ SOLN
INTRAMUSCULAR | Status: DC | PRN
  Administered 2014-04-18: .2 mg via EPIDURAL

## 2014-04-18 MED ORDER — MEPERIDINE HCL 25 MG/ML IJ SOLN: 6.2500 mg | INTRAMUSCULAR | Status: DC | PRN

## 2014-04-18 MED ORDER — ONDANSETRON HCL 4 MG/2ML IJ SOLN: 4.0000 mg | INTRAMUSCULAR | Status: DC | PRN

## 2014-04-18 MED ORDER — NALOXONE HCL 1 MG/ML IJ SOLN
1.0000 ug/kg/h | INTRAVENOUS | Status: DC | PRN
  Filled 2014-04-18: qty 2

## 2014-04-18 MED ORDER — PRENATAL MULTIVITAMIN CH
1.0000 | ORAL_TABLET | Freq: Every day | ORAL | Status: DC
  Administered 2014-04-19 – 2014-04-21 (×3): 1 via ORAL
  Filled 2014-04-18 (×3): qty 1

## 2014-04-18 MED ORDER — NALOXONE HCL 0.4 MG/ML IJ SOLN: 0.4000 mg | INTRAMUSCULAR | Status: DC | PRN

## 2014-04-18 MED ORDER — DIPHENHYDRAMINE HCL 25 MG PO CAPS: 25.0000 mg | ORAL_CAPSULE | Freq: Four times a day (QID) | ORAL | Status: DC | PRN

## 2014-04-18 MED ORDER — NALBUPHINE HCL 10 MG/ML IJ SOLN
5.0000 mg | Freq: Once | INTRAMUSCULAR | Status: AC | PRN
  Administered 2014-04-18: 5 mg via INTRAVENOUS
  Filled 2014-04-18: qty 1

## 2014-04-18 MED ORDER — LACTATED RINGERS IV SOLN
INTRAVENOUS | Status: DC
  Administered 2014-04-18 (×3): via INTRAVENOUS

## 2014-04-18 MED ORDER — NALBUPHINE HCL 10 MG/ML IJ SOLN: 5.0000 mg | Freq: Once | INTRAMUSCULAR | Status: AC | PRN

## 2014-04-18 MED ORDER — OXYTOCIN 10 UNIT/ML IJ SOLN
40.0000 [IU] | INTRAMUSCULAR | Status: DC | PRN
  Administered 2014-04-18: 40 [IU] via INTRAVENOUS

## 2014-04-18 MED ORDER — OXYCODONE-ACETAMINOPHEN 5-325 MG PO TABS
1.0000 | ORAL_TABLET | ORAL | Status: DC | PRN
  Administered 2014-04-19 – 2014-04-21 (×5): 1 via ORAL
  Filled 2014-04-18 (×4): qty 1

## 2014-04-18 MED ORDER — KETOROLAC TROMETHAMINE 30 MG/ML IJ SOLN
30.0000 mg | Freq: Four times a day (QID) | INTRAMUSCULAR | Status: DC | PRN
Start: 2014-04-18 — End: 2014-04-18

## 2014-04-18 MED ORDER — GLYBURIDE 2.5 MG PO TABS
2.5000 mg | ORAL_TABLET | Freq: Every day | ORAL | Status: DC
  Administered 2014-04-18: 2.5 mg via ORAL
  Filled 2014-04-18 (×3): qty 1

## 2014-04-18 MED ORDER — GLYBURIDE 1.25 MG PO TABS
1.2500 mg | ORAL_TABLET | Freq: Every day | ORAL | Status: DC
Start: 2014-04-19 — End: 2014-04-20
  Administered 2014-04-19: 1.25 mg via ORAL
  Filled 2014-04-18 (×3): qty 1

## 2014-04-18 MED ORDER — SIMETHICONE 80 MG PO CHEW
80.0000 mg | CHEWABLE_TABLET | Freq: Three times a day (TID) | ORAL | Status: DC
  Administered 2014-04-18 – 2014-04-21 (×8): 80 mg via ORAL
  Filled 2014-04-18 (×10): qty 1

## 2014-04-18 SURGICAL SUPPLY — 40 items
CANISTER WOUND CARE 500ML ATS (WOUND CARE) IMPLANT
CLAMP CORD UMBIL (MISCELLANEOUS) IMPLANT
CLOTH BEACON ORANGE TIMEOUT ST (SAFETY) ×3 IMPLANT
CONTAINER PREFILL 10% NBF 15ML (MISCELLANEOUS) ×6 IMPLANT
DRAPE SHEET LG 3/4 BI-LAMINATE (DRAPES) IMPLANT
DRSG OPSITE POSTOP 4X10 (GAUZE/BANDAGES/DRESSINGS) ×3 IMPLANT
DRSG VAC ATS LRG SENSATRAC (GAUZE/BANDAGES/DRESSINGS) IMPLANT
DRSG VAC ATS MED SENSATRAC (GAUZE/BANDAGES/DRESSINGS) IMPLANT
DRSG VAC ATS SM SENSATRAC (GAUZE/BANDAGES/DRESSINGS) IMPLANT
DURAPREP 26ML APPLICATOR (WOUND CARE) ×3 IMPLANT
ELECT REM PT RETURN 9FT ADLT (ELECTROSURGICAL) ×3
ELECTRODE REM PT RTRN 9FT ADLT (ELECTROSURGICAL) ×1 IMPLANT
EXTRACTOR VACUUM M CUP 4 TUBE (SUCTIONS) ×1 IMPLANT
EXTRACTOR VACUUM M CUP 4' TUBE (SUCTIONS) ×1
GLOVE BIO SURGEON STRL SZ8 (GLOVE) ×6 IMPLANT
GOWN STRL REUS W/TWL LRG LVL3 (GOWN DISPOSABLE) ×6 IMPLANT
KIT ABG SYR 3ML LUER SLIP (SYRINGE) IMPLANT
LIQUID BAND (GAUZE/BANDAGES/DRESSINGS) ×3 IMPLANT
NDL HYPO 25X5/8 SAFETYGLIDE (NEEDLE) ×1 IMPLANT
NEEDLE HYPO 25X5/8 SAFETYGLIDE (NEEDLE) ×3 IMPLANT
NS IRRIG 1000ML POUR BTL (IV SOLUTION) ×3 IMPLANT
PACK C SECTION WH (CUSTOM PROCEDURE TRAY) ×3 IMPLANT
PAD OB MATERNITY 4.3X12.25 (PERSONAL CARE ITEMS) ×3 IMPLANT
RTRCTR C-SECT PINK 25CM LRG (MISCELLANEOUS) ×3 IMPLANT
STAPLER VISISTAT 35W (STAPLE) ×2 IMPLANT
SUT GUT PLAIN 0 CT-3 TAN 27 (SUTURE) IMPLANT
SUT MNCRL 0 VIOLET CTX 36 (SUTURE) ×3 IMPLANT
SUT MNCRL AB 4-0 PS2 18 (SUTURE) IMPLANT
SUT MON AB 2-0 CT1 27 (SUTURE) ×3 IMPLANT
SUT MON AB 3-0 SH 27 (SUTURE) ×3
SUT MON AB 3-0 SH27 (SUTURE) IMPLANT
SUT MONOCRYL 0 CTX 36 (SUTURE) ×6
SUT PDS AB 0 CTX 60 (SUTURE) IMPLANT
SUT PLAIN 2 0 XLH (SUTURE) IMPLANT
SUT VIC AB 0 CTX 36 (SUTURE) ×3
SUT VIC AB 0 CTX36XBRD ANBCTRL (SUTURE) IMPLANT
SUT VIC AB 2-0 CT1 27 (SUTURE)
SUT VIC AB 2-0 CT1 TAPERPNT 27 (SUTURE) IMPLANT
TOWEL OR 17X24 6PK STRL BLUE (TOWEL DISPOSABLE) ×3 IMPLANT
TRAY FOLEY CATH 14FR (SET/KITS/TRAYS/PACK) ×3 IMPLANT

## 2014-04-18 NOTE — Anesthesia Postprocedure Evaluation (Signed)
  Anesthesia Post-op Note  Patient: [name]  Procedure(s) Performed: Procedure(s) (LRB): REPEAT CESAREAN SECTION (N/A)  Patient Location: PACU  Anesthesia Type: Spinal  Level of Consciousness: awake and alert   Airway and Oxygen Therapy: Patient Spontanous Breathing  Post-op Pain: mild  Post-op Assessment: Post-op Vital signs reviewed, Patient's Cardiovascular Status Stable, Respiratory Function Stable, Patent Airway and No signs of Nausea or vomiting  Last Vitals:  Filed Vitals:   04/18/14 1029  BP: 132/73  Pulse: 63  Temp: 36.8 C  Resp: 16    Post-op Vital Signs: stable   Complications: No apparent anesthesia complications

## 2014-04-18 NOTE — Op Note (Signed)
Cesarean Section Procedure Note   [name]   04/18/2014  Indications: Scheduled Proceedure/Maternal Request   Pre-operative Diagnosis: PREV C/S,MACROSOMIA,DIABETIC TYPE 2.   Post-operative Diagnosis: Same   Surgeon: Baltazar Najjar A  Assistants: Gracy Racer  Anesthesia: spinal  Procedure Details:  The patient was seen in the Holding Room. The risks, benefits, complications, treatment options, and expected outcomes were discussed with the patient. The patient concurred with the proposed plan, giving informed consent. The patient was identified as [name] and the procedure verified as C-Section Delivery. A Time Out was held and the above information confirmed.  After induction of anesthesia, the patient was draped and prepped in the usual sterile manner. A transverse incision was made and carried down through the subcutaneous tissue to the fascia. The fascial incision was made and extended transversely. The fascia was separated from the underlying rectus tissue superiorly and inferiorly. The peritoneum was identified and entered. The peritoneal incision was extended longitudinally. The utero-vesical peritoneal reflection was incised transversely and the bladder flap was bluntly freed from the lower uterine segment. A low transverse uterine incision was made. Delivered from cephalic presentation was a 3930 gram living newborn female infant(s). APGAR (1 MIN): 9   APGAR (5 MINS): 9   APGAR (10 MINS):    A cord ph was not sent. The umbilical cord was clamped and cut cord. A sample was obtained for evaluation. The placenta was removed Intact and appeared normal.  The uterine incision was closed with running locked sutures of 0 Monocryl. A second imbricating layer of the same suture was placed.  Hemostasis was observed. The paracolic gutters were irrigated. The parieto peritoneum was closed in a running fashion with 2-0 Vicryl.  The fascia was then reapproximated with running  sutures of 0 Vicryl.  The skin was closed with staples.  Instrument, sponge, and needle counts were correct prior the abdominal closure and were correct at the conclusion of the case.    Findings:   Estimated Blood Loss: * No blood loss amount entered *   Total IV Fluids: 2789ml   Urine Output: 200CC OF clear urine  Specimens: @ORSPECIMEN @   Complications: no complications  Disposition: PACU - hemodynamically stable.  Maternal Condition: stable   Baby condition / location:  Couplet care / Skin to Skin    Signed: Surgeon(s): Shelly Bombard, MD Frederico Hamman, MD

## 2014-04-18 NOTE — Anesthesia Postprocedure Evaluation (Signed)
  Anesthesia Post-op Note  Anesthesia Post Note  Patient: [name]  Procedure(s) Performed: Procedure(s) (LRB): REPEAT CESAREAN SECTION (N/A)  Anesthesia type: Spinal  Patient location: Mother/Baby  Post pain: Pain level controlled  Post assessment: Post-op Vital signs reviewed  Last Vitals:  Filed Vitals:   04/18/14 1330  BP: 114/86  Pulse: 69  Temp:   Resp: 18    Post vital signs: Reviewed  Level of consciousness: awake  Complications: No apparent anesthesia complications

## 2014-04-18 NOTE — Transfer of Care (Signed)
Immediate Anesthesia Transfer of Care Note  Patient: [name]  Procedure(s) Performed: Procedure(s): REPEAT CESAREAN SECTION (N/A)  Patient Location: PACU  Anesthesia Type:Spinal  Level of Consciousness: awake, alert  and oriented  Airway & Oxygen Therapy: Patient Spontanous Breathing  Post-op Assessment: Report given to RN and Post -op Vital signs reviewed and stable  Post vital signs: Reviewed and stable  Last Vitals:  Filed Vitals:   04/18/14 0557  BP: 122/81  Pulse: 95  Temp: 36.9 C  Resp: 20    Complications: No apparent anesthesia complications

## 2014-04-18 NOTE — Lactation Note (Signed)
This note was copied from the chart of [name]. Lactation Consultation Note Brief visit at 11 hours of age.  Mom reports baby has been sleepy, but she is hand expressing and giving colostrum to baby.  Tech at bedside to administer EKG for baby.  Baby asleep and was just given a few mls of colostrum.  Encouraged mom to call for assist as needed.  Encouraged mom she is doing well offering EBM until baby is more awake. MBU RN at bedside.    Patient Name: Jennifer Desaree Pacini Today's Date: 04/18/2014     Maternal Data    Feeding    LATCH Score/Interventions                      Lactation Tools Discussed/Used     Consult Status      Dajuana Palen, Justine Null 04/18/2014, 11:18 PM

## 2014-04-18 NOTE — H&P (Signed)
Chenita SHAWNTE WINTON is a 29 y.o. female presenting for repeat C/S. Maternal Medical History:  Fetal activity: Perceived fetal activity is normal.   Last perceived fetal movement was within the past hour.    Prenatal Complications - Diabetes: type 2. Diabetes is managed by oral agent (monotherapy).      OB History    Gravida Para Term Preterm AB TAB SAB Ectopic Multiple Living   5 1 1  3 1 2   1      Past Medical History  Diagnosis Date  . Pelvic inflammatory disease   . Pancreatitis     diet controlled  . Polycystic ovary syndrome   . Pancreatic cyst   . Asthma     rarely uses inhaler  . Missed ab     x 2, one requiring D & E  . 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 Bronchitis   . Bipolar disorder     HX PPD after 2014 delivery, no meds  . Anemia    Past Surgical History  Procedure Laterality Date  . Splenectomy, total  2012  . Pancreas surgery  2012    s/p partial pancreatectomy  . Cesarean section N/A 05/23/2012    Procedure: CESAREAN SECTION;  Surgeon: Lahoma Crocker, MD;  Location: [facility];  Service: Obstetrics;  Laterality: N/A;  primary  . Dilation and curettage of uterus       for MAB  . Wisdom tooth extraction     Family History: family history includes Diabetes in her mother. There is no history of Colon cancer. Social History:  reports that she has been smoking Cigarettes.  She has a 6.5 pack-year smoking history. She has never used smokeless tobacco. She reports that she drinks alcohol. She reports that she does not use illicit drugs.   Prenatal Transfer Tool  Maternal Diabetes: Yes:  Diabetes Type:  Pre-pregnancy, Insulin/Medication controlled Genetic Screening: Normal Maternal Ultrasounds/Referrals: Normal Fetal Ultrasounds or other Referrals:  Referred to Materal Fetal Medicine  Maternal Substance Abuse:  No Significant Maternal Medications:  Meds include: Other: Glyburide Significant Maternal Lab  Results:  None Other Comments:  None  Review of Systems  All other systems reviewed and are negative.     Blood pressure 122/81, pulse 95, temperature 98.4 F (36.9 C), temperature source Oral, resp. rate 20, last menstrual period 09/16/2013, SpO2 100 %, not currently breastfeeding. Maternal Exam:  Abdomen: Patient reports no abdominal tenderness.   Physical Exam  Nursing note and vitals reviewed. Constitutional: She is oriented to person, place, and time. She appears well-developed and well-nourished.  HENT:  Head: Normocephalic and atraumatic.  Eyes: Conjunctivae are normal. Pupils are equal, round, and reactive to light.  Neck: Normal range of motion. Neck supple.  Cardiovascular: Normal rate and regular rhythm.   Respiratory: Effort normal.  GI: Soft.  Musculoskeletal: Normal range of motion.  Neurological: She is alert and oriented to person, place, and time.  Skin: Skin is warm and dry.  Psychiatric: She has a normal mood and affect. Her behavior is normal. Judgment and thought content normal.    Prenatal labs: ABO, Rh: --/--/B POS (02/11 1405) Antibody: NEG (02/11 1405) Rubella: 2.57 (10/21 1339) RPR: Non Reactive (02/11 1405)  HBsAg: NEGATIVE (10/21 1339)  HIV: NONREACTIVE (11/18 1556)  GBS:     Assessment/Plan: 39 weeks.  Previous C/S.  Kathlee Nations repeat C/S.   Evaleigh Mccamy A 04/18/2014, 6:58 AM

## 2014-04-18 NOTE — Anesthesia Procedure Notes (Signed)
Spinal Patient location during procedure: OR Start time: 04/18/2014 7:30 AM Staffing Anesthesiologist: Lauretta Grill JENNETTE Performed by: anesthesiologist  Preanesthetic Checklist Completed: patient identified, site marked, surgical consent, pre-op evaluation, timeout performed, IV checked, risks and benefits discussed and monitors and equipment checked Spinal Block Patient position: sitting Prep: ChloraPrep Patient monitoring: continuous pulse ox, blood pressure and heart rate Approach: midline Location: L3-4 Injection technique: single-shot Needle Needle type: Sprotte  Needle gauge: 24 G Needle length: 9 cm Assessment Sensory level: T4 Additional Notes Functioning IV was confirmed and monitors were applied. Sterile prep and drape, including hand hygiene, mask and sterile gloves were used. The patient was positioned and the spine was prepped. The skin was anesthetized with lidocaine.  Free flow of clear CSF was obtained prior to injecting local anesthetic into the CSF.  The spinal needle aspirated freely following injection.  The needle was carefully withdrawn.  The patient tolerated the procedure well. Consent was obtained prior to procedure with all questions answered and concerns addressed. Risks including but not limited to bleeding, infection, nerve damage, paralysis, failed block, inadequate analgesia, allergic reaction, high spinal, itching and headache were discussed and the patient wished to proceed.   Lauretta Grill, MD

## 2014-04-18 NOTE — Addendum Note (Signed)
Addendum  created 04/18/14 1407 by Billie Lade, CRNA   Modules edited: Notes Section   Notes Section:  File: [id number]

## 2014-04-18 NOTE — Lactation Note (Signed)
This note was copied from the chart of Jennifer [place]. Lactation Consultation Note: Mom asking for assist with latch. Unwrapped baby and placed skin to skin. Would not latch- too sleepy,. Mom easily able to hand express Colostrum. Encouraged skin to skin but mom wants to wrap him to let visitors hold him. Reviewed feeding cues and encouraged to feed whenever she sees them. Reviewed normal newborn behavior the first 24 hours. Bf brochure given with resources for support after DC. To call for assist prn  Patient Name: [name] Today's Date: 04/18/2014 Reason for consult: Initial assessment   Maternal Data Formula Feeding for Exclusion: No Has patient been taught Hand Expression?: Yes Does the patient have breastfeeding experience prior to this delivery?: Yes  Feeding Feeding Type: Breast Fed  LATCH Score/Interventions Latch: Too sleepy or reluctant, no latch achieved, no sucking elicited.  Audible Swallowing: None  Type of Nipple: Everted at rest and after stimulation  Comfort (Breast/Nipple): Soft / non-tender     Hold (Positioning): Assistance needed to correctly position infant at breast and maintain latch.  LATCH Score: 5  Lactation Tools Discussed/Used     Consult Status Consult Status: Follow-up Date: 04/19/14 Follow-up type: In-patient    Truddie Crumble 04/18/2014, 2:47 PM

## 2014-04-19 LAB — CBC
HEMATOCRIT: 28.7 % — AB (ref 36.0–46.0)
HEMOGLOBIN: 9.8 g/dL — AB (ref 12.0–15.0)
MCH: 22.7 pg — AB (ref 26.0–34.0)
MCHC: 34.1 g/dL (ref 30.0–36.0)
MCV: 66.6 fL — ABNORMAL LOW (ref 78.0–100.0)
PLATELETS: 418 10*3/uL — AB (ref 150–400)
RBC: 4.31 MIL/uL (ref 3.87–5.11)
RDW: 14 % (ref 11.5–15.5)
WBC: 14.3 10*3/uL — ABNORMAL HIGH (ref 4.0–10.5)

## 2014-04-19 LAB — GLUCOSE, CAPILLARY
GLUCOSE-CAPILLARY: 88 mg/dL (ref 70–99)
GLUCOSE-CAPILLARY: 96 mg/dL (ref 70–99)
GLUCOSE-CAPILLARY: 34 mg/dL — AB (ref 70–99)
Glucose-Capillary: 123 mg/dL — ABNORMAL HIGH (ref 70–99)
Glucose-Capillary: 99 mg/dL (ref 70–99)

## 2014-04-19 NOTE — Progress Notes (Signed)
Hypoglycemic Event  CBG: 34 @ 1708  Treatment: 15 GM carbohydrate snack 15 gm carb snack  Symptoms: Pale and Shaky  Follow-up CBG: [id number] CBG Result:123  Possible Reasons for Event: Other: too much glyburide not enough carbs  Comments/MD notified: Jodi Mourning message left for him to call`````Dr. Jodi Mourning suggested to hold the next dose of glyburide and consult with diabetic coordinator in am.  `````````````````````````````````````````````````````````````````````````````````````````````````````````````````````````````````````````````````````````````````````````````````````````````````````````````````````````````````````````````````````````````````````````````````````````````````````````````````````````````````````````````````````````````````````````````````````````````````````````````````````````````````````````````````````````````````````````````````````````````````````````````````````````````````````````````````````````````````````````````````````````````````````````````````````````````````````````````````````````````````````````````````````````````````````````````````````````````````````````````````````````````````````````````````````````````````````````````````````````````````````````````````````````````````````````````````````````````````````````````````````````````````````````````````````````````````````````````````````````````````````````````````````````````````````````````````````````````````````````````````````````````````````````````````````````````````````````````````````````````````````````````````````````````````````````````````````````````````````````````````````````````````````````````````````````````````````````````````````````````````````````````````````````````````````````````````````````````````````````````````````````````````````````````````````````````````````````````````````````````````````````````````````````````````````````````````````````````````````````````````````````````````````````````````````````````````````````````````````````````````````````````````````````````````````````````````````````````````````````````````````````````````````````````````````````````````````````````````````````````````````````````````````````````````````````````````````````````````````````````````````````````````````````````````````````````````````````````````````````````````````````````````````````````` ```````````````````````````````````````````````````````````````````````````````````````````````````````````````````````````````````````````````````````````````````````````````````````````````````````````````````````````````````````````````````````````````````````````````````````````````````````````````````````````````````````````````````````````````````````````````````````````````````````````````````````````````````````````````````````````````````````````````````````````````````````````````````````````````````````````````````````````````````````````````````````````````````````````````````````````````````````````````````````````````````````````````````````````````````````````````````````````````````````````````````````````````````````````````````````````````````````````````````````````````````````````````````````````````````````````````````````````````````````````````````````````````````````````````````````````````````````````````````````````````````````````````````````````````````````````````````````````````````````````````````````````````````````````````````````````````````````````````````````````````````````````````````````````````````````````````````````````````````````````````````````````````````````````````````````````````````````````````````````````````````````````````````````````````````````````````````````````````````````````````````````````````````````````````````````````````````````````````````````````````````````````````````````````````````````````````````````````````````````````````````````````````````````````````````````````````````````````````````````````````````````````````````````````````````````````````````````````````````````````````````````````````````````````````````````````````````````````````````````````````````````````````````````````````````````````````````````````````````    Johnnette Litter  Remember to initiate Hypoglycemia Order Set & complete

## 2014-04-19 NOTE — Discharge Instructions (Signed)
Iron-Rich Diet An iron-rich diet contains foods that are good sources of iron. Iron is an important mineral that helps your body produce hemoglobin. Hemoglobin is a protein in red blood cells that carries oxygen to the body's tissues. Sometimes, the iron level in your blood can be low. This may be caused by:  A lack of iron in your diet.  Blood loss.  Times of growth, such as during pregnancy or during a child's growth and development. Low levels of iron can cause a decrease in the number of red blood cells. This can result in iron deficiency anemia. Iron deficiency anemia symptoms include:  Tiredness.  Weakness.  Irritability.  Increased chance of infection. Here are some recommendations for daily iron intake:  Males older than 30 years of age need 8 mg of iron per day.  Women ages 19 to 50 need 18 mg of iron per day.  Pregnant women need 27 mg of iron per day, and women who are over 19 years of age and breastfeeding need 9 mg of iron per day.  Women over the age of 50 need 8 mg of iron per day. SOURCES OF IRON There are 2 types of iron that are found in food: heme iron and nonheme iron. Heme iron is absorbed by the body better than nonheme iron. Heme iron is found in meat, poultry, and fish. Nonheme iron is found in grains, beans, and vegetables. Heme Iron Sources Food / Iron (mg)  Chicken liver, 3 oz (85 g)/ 10 mg  Beef liver, 3 oz (85 g)/ 5.5 mg  Oysters, 3 oz (85 g)/ 8 mg  Beef, 3 oz (85 g)/ 2 to 3 mg  Shrimp, 3 oz (85 g)/ 2.8 mg  Turkey, 3 oz (85 g)/ 2 mg  Chicken, 3 oz (85 g) / 1 mg  Fish (tuna, halibut), 3 oz (85 g)/ 1 mg  Pork, 3 oz (85 g)/ 0.9 mg Nonheme Iron Sources Food / Iron (mg)  Ready-to-eat breakfast cereal, iron-fortified / 3.9 to 7 mg  Tofu,  cup / 3.4 mg  Kidney beans,  cup / 2.6 mg  Baked potato with skin / 2.7 mg  Asparagus,  cup / 2.2 mg  Avocado / 2 mg  Dried peaches,  cup / 1.6 mg  Raisins,  cup / 1.5 mg  Soy milk, 1 cup  / 1.5 mg  Whole-wheat bread, 1 slice / 1.2 mg  Spinach, 1 cup / 0.8 mg  Broccoli,  cup / 0.6 mg IRON ABSORPTION Certain foods can decrease the body's absorption of iron. Try to avoid these foods and beverages while eating meals with iron-containing foods:  Coffee.  Tea.  Fiber.  Soy. Foods containing vitamin C can help increase the amount of iron your body absorbs from iron sources, especially from nonheme sources. Eat foods with vitamin C along with iron-containing foods to increase your iron absorption. Foods that are high in vitamin C include many fruits and vegetables. Some good sources are:  Fresh orange juice.  Oranges.  Strawberries.  Mangoes.  Grapefruit.  Red bell peppers.  Green bell peppers.  Broccoli.  Potatoes with skin.  Tomato juice. Document Released: 10/05/2004 Document Revised: 05/16/2011 Document Reviewed: 08/12/2010 ExitCare Patient Information 2015 [employer]. This information is not intended to replace advice given to you by your health care provider. Make sure you discuss any questions you have with your health care provider.  

## 2014-04-19 NOTE — Progress Notes (Signed)
Subjective: Postpartum Day 1: Cesarean Delivery Patient reports tolerating PO, + flatus and no problems voiding.    Objective: Vital signs in last 24 hours: Temp:  [97.8 F (36.6 C)-98.3 F (36.8 C)] 98.2 F (36.8 C) (02/13 0530) Pulse Rate:  [50-81] 56 (02/13 0530) Resp:  [13-23] 18 (02/13 0530) BP: (95-132)/(67-103) 120/74 mmHg (02/13 0530) SpO2:  [98 %-100 %] 100 % (02/13 0530)  Physical Exam:  General: alert and no distress Lochia: appropriate Uterine Fundus: firm Incision: healing well DVT Evaluation: No evidence of DVT seen on physical exam.   Recent Labs  04/17/14 1405 04/19/14 0553  HGB 10.2* 9.8*  HCT 30.6* 28.7*    Assessment/Plan: Status post Cesarean section. Doing well postoperatively.  Continue current care.  Alessandria Henken A 04/19/2014, 7:48 AM

## 2014-04-19 NOTE — Progress Notes (Signed)
Clinical Social Work Department PSYCHOSOCIAL ASSESSMENT - MATERNAL/CHILD 04/19/2014  Patient:  [name],[name]  Account Number:  [id number]  Admit Date:  04/18/2014  Childs Name:   [name]    Clinical Social Worker:  Dottie Vaquerano, LCSW   Date/Time:  04/19/2014 11:30 AM  Date Referred:  04/18/2014   Referral source  Central Nursery     Referred reason  Hx of PP Depression   Other referral source:    I:  FAMILY / HOME ENVIRONMENT Child's legal guardian:  PARENT  Guardian - Name Guardian - Age Guardian - Address  [name],[name] [address]  [place], [place] [address]  [name], [name]  same as above   Other household support members/support persons Other support:    II  PSYCHOSOCIAL DATA Information Source:    Financial and Community Resources Employment:   FOB is employed  Mother worked in the beginning of the pregnancy   Financial resources:  Medicaid If Medicaid - County:   Other  WIC  Food Stamps   School / Grade:   Maternity Care Coordinator / Child Services Coordination / Early Interventions:  Cultural issues impacting care:    III  STRENGTHS Strengths  Supportive family/friends  Adequate Resources  Home prepared for Child (including basic supplies)   Strength comment:    IV  RISK FACTORS AND CURRENT PROBLEMS Current Problem:       V  SOCIAL WORK ASSESSMENT Acknowledged order for social work consult to assess mother's hx of bipolar and PP Depression.   Met with mother who was pleasant and receptive to social work intervention. She and FOB are not married, but they cohabitate and have been in a relationship for 10 years.   They have one other child age 2.  Mother acknowledge hx of PP Depression, but denies any hx of bipolar.  "My mother was diagnosed with bipolar".  Informed that 4 weeks after the birth of her child, she noticed a change in her mood and was diagnosed with PP Depression.  Mother states that she sought counseling and it was  helpful.  Informed that her PP Depression was severe and she has already have a plan in mind should she have symptoms again.  Mother states that she has already spoken with her Physician who offered medication now, but she reports no current symptoms and stated that she will reevaluate need for treatment when she sees her Physician in 3 weeks.  She was not open to medication in the past, but states that she will consider it in the future if needed.  "I thought I was going crazy". Informed that the support of her family was instrumental in her recovery, and she feels that she can continue to rely on their support.  She denies any hx of illicit drug use. No acute social concerns noted or reported at this time. Mother informed of social work availability.      VI SOCIAL WORK PLAN Social Work Plan  No Further Intervention Required / No Barriers to Discharge   Type of pt/family education:   PP Depression - importance of recognizing the symptoms and seeking treatment.   If child protective services report - county:   If child protective services report - date:   Information/referral to community resources comment:   Other social work plan:     

## 2014-04-19 NOTE — Lactation Note (Signed)
This note was copied from the chart of Jennifer [place]. Lactation Consultation Note  Baby comes off nipple easily.  Attempted BF with #20 & #24NS but baby would not sustain latch. Repositioned mother to side lying position.  Using compressionbaby latched.  Sucks and swallows heard. LS8. Mother likes this position.  Mother has good flow of colostrum.  Gave mother comfort gels. Encouraged her to call if she needs further assistance.  Patient Name: [name] [id number] Date: 04/19/2014 Reason for consult: Follow-up assessment   Maternal Data    Feeding Feeding Type: Breast Fed Nipple Type: Slow - flow  LATCH Score/Interventions Latch: Repeated attempts needed to sustain latch, nipple held in mouth throughout feeding, stimulation needed to elicit sucking reflex. Intervention(s): Adjust position;Assist with latch;Breast massage;Breast compression  Audible Swallowing: Spontaneous and intermittent  Type of Nipple: Everted at rest and after stimulation  Comfort (Breast/Nipple): Soft / non-tender     Hold (Positioning): Assistance needed to correctly position infant at breast and maintain latch.  LATCH Score: 8  Lactation Tools Discussed/Used     Consult Status Consult Status: Follow-up Date: 04/20/14 Follow-up type: In-patient    [name] [facility] 04/19/2014, 1:42 PM

## 2014-04-19 NOTE — Lactation Note (Signed)
This note was copied from the chart of Jennifer [place]. Lactation Consultation Note  Follow up visit made.  Mom states baby wont sustain a latch and only licks colostrum.  She started supplementing with small amounts of formula this AM.  RN has set up DEBP for mom to use every 3 hours.  Instructed mom to call for [facility] assist when baby starts to cue.  Discussed possible nipple shield use if appropriate.  Patient Name: [name] Today's Date: 04/19/2014     Maternal Data    Feeding Feeding Type: Formula Nipple Type: Slow - flow Length of feed: 2 min  LATCH Score/Interventions                      Lactation Tools Discussed/Used     Consult Status      Ave Filter 04/19/2014, 10:46 AM

## 2014-04-19 NOTE — Plan of Care (Signed)
Problem: Phase II Progression Outcomes Goal: Progress activity as tolerated unless otherwise ordered Outcome: Progressing Diabetic consult written per md for 2/14 in am to dose glyburide after hypoglycemic attack at 1710 2/16. See note

## 2014-04-20 LAB — GLUCOSE, CAPILLARY
GLUCOSE-CAPILLARY: 94 mg/dL (ref 70–99)
GLUCOSE-CAPILLARY: 44 mg/dL — AB (ref 70–99)
Glucose-Capillary: 78 mg/dL (ref 70–99)
Glucose-Capillary: 54 mg/dL — ABNORMAL LOW (ref 70–99)
Glucose-Capillary: 76 mg/dL (ref 70–99)
Glucose-Capillary: 92 mg/dL (ref 70–99)
Glucose-Capillary: 100 mg/dL — ABNORMAL HIGH (ref 70–99)
Glucose-Capillary: 120 mg/dL — ABNORMAL HIGH (ref 70–99)

## 2014-04-20 MED ORDER — GLYBURIDE 1.25 MG PO TABS
1.2500 mg | ORAL_TABLET | Freq: Two times a day (BID) | ORAL | Status: DC
  Administered 2014-04-20: 1.25 mg via ORAL
  Filled 2014-04-20 (×4): qty 1

## 2014-04-20 NOTE — Progress Notes (Signed)
Inpatient Diabetes Program Recommendations  AACE/ADA: New Consensus Statement on Inpatient Glycemic Control (2013)  Target Ranges:  Prepandial:   less than 140 mg/dL      Peak postprandial:   less than 180 mg/dL (1-2 hours)      Critically ill patients:  140 - 180 mg/dL   Results for [name], [name] (MRN [id number]) as of 04/20/2014 11:04  Ref. Range 04/20/2014 03:56 04/20/2014 05:42 04/20/2014 08:12 04/20/2014 08:32 04/20/2014 10:55  Glucose-Capillary Latest Range: 70-99 mg/dL 78 94 54 (L) 76 44 (LL)    Reason for Assessment: hypoglycemia  Diabetes history: Type 2 Outpatient Diabetes medications: Glyburide 1.25mg  at breakfast, 1.25mg  at lunch and 2.5mg  at bedtime Current orders for Inpatient glycemic control: Glyburide 1.25mg  at breakfast, 2.5mg  at lunch, 2.5 at supper  Please consider discontinuing current orders for Glyburide.  Consider starting Glyburide 1.25mg  qam and qhs.  I have spoken to patient by phone- she has a blood sugar meter at home and is willing to check blood sugars before each meal and at bedtime.  I have asked her to document her blood sugar numbers and to follow up with her MD this week to report blood sugars and have them change medication if needed. For safety of this patient, my preference is to have her blood sugars run a little high and to avoid low blood sugars completely.   Gentry Fitz, RN, BA, MHA, CDE Diabetes Coordinator Inpatient Diabetes Program  [phone] (Team Pager) [phone] [facility]) 04/20/2014 11:14 AM

## 2014-04-20 NOTE — Progress Notes (Signed)
Hypoglycemic Event  CBG: 54 @ 0812  Treatment: 15 GM carbohydrate snack  Symptoms: Pale and Shaky  Follow-up CBG: Time: 0832 CBG Result: 76  Possible Reasons for Event: inadequate meal intake (carb modified diet) and Medication regiment  Comments/MD notified: Notified Dr Jodi Mourning [phone] received order to change diet to regular. Paged Diabetic Coordinator.   Carolann Littler  Remember to initiate Hypoglycemia Order Set & complete

## 2014-04-20 NOTE — Progress Notes (Signed)
Subjective: Postpartum Day 2: Cesarean Delivery Patient reports tolerating PO, + flatus and no problems voiding.    Objective: Vital signs in last 24 hours: Temp:  [97.7 F (36.5 C)-98.3 F (36.8 C)] 97.7 F (36.5 C) (02/13 1900) Pulse Rate:  [66-76] 76 (02/13 1900) Resp:  [19-20] 19 (02/13 1900) BP: (100-122)/(71-83) 100/71 mmHg (02/13 1900) SpO2:  [98 %-99 %] 99 % (02/13 1900) Weight:  [169 lb (76.658 kg)] 169 lb (76.658 kg) (02/13 0610)  Physical Exam:  General: alert and no distress Lochia: appropriate Uterine Fundus: firm Incision: healing well DVT Evaluation: No evidence of DVT seen on physical exam.   Recent Labs  04/17/14 1405 04/19/14 0553  HGB 10.2* 9.8*  HCT 30.6* 28.7*    Assessment/Plan: Status post Cesarean section. Doing well postoperatively.  Continue current care.  HARPER,CHARLES A 04/20/2014, 5:31 AM

## 2014-04-20 NOTE — Lactation Note (Signed)
This note was copied from the chart of Jennifer [place]. Lactation Consultation Note: Follow up visit with mom. She reports that baby just finished feeding for 20 min. Baby asleep at her side. LS 9 by RN. Reports her breasts are feeling fuller this morning and she is leaking more. Breast feels softer after nursing. Has given some formula when mom's blood sugar got low. Expereinced BF mom. No questions at present. To call prn  Patient Name: [name] [id number] Date: 04/20/2014 Reason for consult: Follow-up assessment   Maternal Data Formula Feeding for Exclusion: No Does the patient have breastfeeding experience prior to this delivery?: Yes  Feeding   LATCH Score/Interventions   Lactation Tools Discussed/Used     Consult Status Consult Status: Follow-up Date: 04/21/14 Follow-up type: In-patient    Truddie Crumble 04/20/2014, 11:50 AM

## 2014-04-20 NOTE — Progress Notes (Signed)
Hypoglycemic Event  CBG: 44 @1055   Treatment: 15 GM carbohydrate snack  Symptoms: Shaky  Follow-up CBG: Time: 1136 CBG Result: 92  Possible Reasons for Event: Medication regimen  Comments/MD notified: Spoke with Almyra Free the Diabetic Coordinator informed her of result and discussed plan of care. Almyra Free to call MD with recommendations. This value was taken 2 hours pp. Lunch in room will recheck 2 hrs pp.    Carolann Littler  Remember to initiate Hypoglycemia Order Set & complete

## 2014-04-21 ENCOUNTER — Inpatient Hospital Stay (HOSPITAL_COMMUNITY): Admission: RE | Admit: 2014-04-21 | Payer: Medicaid Other | Source: Ambulatory Visit

## 2014-04-21 ENCOUNTER — Encounter (HOSPITAL_COMMUNITY): Payer: Self-pay | Admitting: Obstetrics

## 2014-04-21 LAB — GLUCOSE, CAPILLARY
GLUCOSE-CAPILLARY: 201 mg/dL — AB (ref 70–99)
GLUCOSE-CAPILLARY: 82 mg/dL (ref 70–99)
Glucose-Capillary: 162 mg/dL — ABNORMAL HIGH (ref 70–99)
Glucose-Capillary: 71 mg/dL (ref 70–99)
Glucose-Capillary: 90 mg/dL (ref 70–99)

## 2014-04-21 LAB — CCBB MATERNAL DONOR DRAW

## 2014-04-21 MED ORDER — OXYCODONE-ACETAMINOPHEN 5-325 MG PO TABS: 1.0000 | ORAL_TABLET | ORAL | Status: DC | PRN

## 2014-04-21 MED ORDER — FUSION PLUS PO CAPS: 1.0000 | ORAL_CAPSULE | Freq: Every day | ORAL | Status: DC

## 2014-04-21 MED ORDER — IBUPROFEN 600 MG PO TABS: 600.0000 mg | ORAL_TABLET | Freq: Four times a day (QID) | ORAL | Status: DC | PRN

## 2014-04-21 NOTE — Progress Notes (Signed)
Subjective: Postpartum Day 3: Cesarean Delivery Patient reports tolerating PO, + flatus, + BM and no problems voiding.    Objective: Vital signs in last 24 hours: Temp:  [98 F (36.7 C)-98.3 F (36.8 C)] 98 F (36.7 C) (02/15 0716) Pulse Rate:  [69-72] 69 (02/15 0716) Resp:  [18] 18 (02/15 0716) BP: (110-120)/(74-75) 120/74 mmHg (02/15 0716) SpO2:  [100 %] 100 % (02/15 0716)  Physical Exam:  General: alert and no distress Lochia: appropriate Uterine Fundus: firm Incision: healing well DVT Evaluation: No evidence of DVT seen on physical exam.   Recent Labs  04/19/14 0553  HGB 9.8*  HCT 28.7*    Assessment/Plan: Status post Cesarean section. Doing well postoperatively.  Discharge home with standard precautions and return to clinic in 2 weeks.  Nautia Lem A 04/21/2014, 8:42 AM

## 2014-04-21 NOTE — Progress Notes (Signed)
Spoke at length with patient after reviewing her history of pancreatitis, pancreatic pseudocyst, and partial pancreatectomy. Pt states that her glucose control has been extremely difficult since she was diagnosed with dm. She states she just used diet control prior to pregnancy as she was having hypoglycemia on the glyburide she was prescribed. She states her glucose is 'all over the place' and her patterns differ every day. She is followed by [facility] and states she will make an appt asap to re-evaluate treatment therapy. After reviewing her glucose pattern since delivery, it is my recommendation that she take glyburide 1.5 mg in the am if her glucose is high throughout the day today due to holding the glyburide this morning. She also understands that if she takes the glyburide ac supper tonight to check her glucose every 3-4 hours, 2 am, and fasting. Since she will be awake during the night for baby's feedings, I recommended she check her glucose throughout that time. Pt is concerned about hypoglycemia during the night because her husband works 12 midnight until mid-morning. Because it appears that she is making insulin on her own (as her glucose was elevated over the weekend and it normalized in the next few hours without medicine. I recommend that her discharge plan be that she takes glyburide 1.5 mg ac breakfast and ac supper if her home glucose patterns indicate the need (as she well understands as she verbally repeated with me). I would also recommend her staying here until at least supper tonight to review her glucose throughout the day today since the glyburide was held this am. Otherwise, she can check on her own at home throughout the day.  She may needs a prescription renewal for her cbg strips, but I did not ask her specifically.She has some glyburide at home, but she may need renewal on 1.5 glyburide for home. I advised her that breastfeeding also typically lessens the need for medication and  that she will need extra calories to provide for the baby. In my view, pt is an intelligent person who understands her blood glucose patterns and how to treat with either more carbohydrates and/or more glyburide.' Pt would like an appt with [facility] education referral as a 1:1 with RN to review her glucose patterns and make recommendations if needed. Will call Dr Jodi Mourning with recommendations. T Thank you, Rosita Kea, RN, CNS, Diabetes Coordinator  Pager [phone]) 8:00 am to 5:00pm Office [phone])  8:12m - 5:00 pm

## 2014-04-21 NOTE — Lactation Note (Signed)
This note was copied from the chart of Jennifer [place]. Lactation Consultation Note Mom is doing her own thing as she wants to do it. Reviewed suply and demand. Mom stated she pumped 3 times yesterday. When she goes home she plans on pumping about  Patient Name: [name] Today's Date: 04/21/2014 Reason for consult: Follow-up assessment   Maternal Data    Feeding Feeding Type: Breast Milk Nipple Type: Slow - flow  LATCH Score/Interventions                      Lactation Tools Discussed/Used Tools: Pump Breast pump type: Double-Electric Breast Pump   Consult Status Consult Status: Complete Date: 04/21/14 Follow-up type: In-patient    Theodoro Kalata 04/21/2014, 6:47 AM

## 2014-04-21 NOTE — Discharge Summary (Signed)
Obstetric Discharge Summary Reason for Admission: cesarean section Prenatal Procedures: NST and ultrasound Intrapartum Procedures: cesarean: low cervical, transverse Postpartum Procedures: none Complications-Operative and Postpartum: none HEMOGLOBIN  Date Value Ref Range Status  04/19/2014 9.8* 12.0 - 15.0 g/dL Final   HCT  Date Value Ref Range Status  04/19/2014 28.7* 36.0 - 46.0 % Final    Physical Exam:  General: alert and no distress Lochia: appropriate Uterine Fundus: firm Incision: healing well DVT Evaluation: No evidence of DVT seen on physical exam.  Discharge Diagnoses: Term Pregnancy-delivered  Discharge Information: Date: 04/21/2014 Activity: pelvic rest Diet: routine Medications: PNV, Ibuprofen, Colace, Iron and Percocet Condition: stable Instructions: refer to practice specific booklet Discharge to: home Follow-up Information    Follow up with HARPER,CHARLES A, MD. Schedule an appointment as soon as possible for a visit in 2 weeks.   Specialty:  Obstetrics and Gynecology   Contact information:   [place] [address] [phone]       Newborn Data: Live born female  Birth Weight: 8 lb 10.6 oz (3930 g) APGAR: 9, 9  Home with mother.  HARPER,CHARLES A 04/21/2014, 8:46 AM

## 2014-04-21 NOTE — Progress Notes (Signed)
A Point of care testing edit sheet was competed for CBG of 201 taken on Patient [place]  at 2150 on 04/20/14.  Therefore under results, a blood sugar of 201 at 2150 on 04/20/2014 will not appear until technical services updates .

## 2014-04-21 NOTE — Lactation Note (Signed)
This note was copied from the chart of Jennifer [place]. Lactation Consultation Note  Follow up visit.  Mom is pumping 60 mls per breast.  No breast pain or tenderness.  Mom plans on obtaining a DEBP from [facility].  Mom shown how to use her DEBP pieces with piston if FOB can assist her with pumping.  Encouraged to call with concerns prn.  Patient Name: [name] Today's Date: 04/21/2014     Maternal Data    Feeding Feeding Type: Breast Milk Nipple Type: Slow - flow  LATCH Score/Interventions                      Lactation Tools Discussed/Used     Consult Status      Ave Filter 04/21/2014, 12:24 PM

## 2014-04-21 NOTE — Progress Notes (Addendum)
Called Dr. Jodi Mourning regarding patient's blood sugars :  201 at 21:30 04/20/2014- patient was given 1.25 of Glyburide  162 at 0008 04/21/2014  71 at 0500 02/215/2016- pt stated if her blood sugar is in  the 60s  she will become dizzy and she requested carbs. Rn gave apple juice and graham crackers with peanut butter due to pt's history of dropping in 30s to 40s. during her stay.   Dr Jodi Mourning said to hold glyburide and call diabetic coordinator this morning .

## 2014-04-21 NOTE — H&P (Signed)
[name] is a 29 y.o. female presenting for nausea and vomiting.  Discharged home after C/S but returns with N/V.Marland Kitchen Maternal Medical History:  Reason for admission: Nausea.   Prenatal Complications - Diabetes: type 2. Diabetes is managed by oral agent (monotherapy).      OB History    Gravida Para Term Preterm AB TAB SAB Ectopic Multiple Living   5 2 2  3 1 2   0 2     Past Medical History  Diagnosis Date  . Pelvic inflammatory disease   . Pancreatitis     diet controlled  . Polycystic ovary syndrome   . Pancreatic cyst   . Asthma     rarely uses inhaler  . Missed ab     x 2, one requiring D & E  . 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 Bronchitis   . Bipolar disorder     HX PPD after 2014 delivery, no meds  . Anemia    Past Surgical History  Procedure Laterality Date  . Splenectomy, total  2012  . Pancreas surgery  2012    s/p partial pancreatectomy  . Cesarean section N/A 05/23/2012    Procedure: CESAREAN SECTION;  Surgeon: Lahoma Crocker, MD;  Location: [facility];  Service: Obstetrics;  Laterality: N/A;  primary  . Dilation and curettage of uterus       for MAB  . Wisdom tooth extraction     Family History: family history includes Diabetes in her mother. There is no history of Colon cancer. Social History:  reports that she has been smoking Cigarettes.  She has a 6.5 pack-year smoking history. She has never used smokeless tobacco. She reports that she drinks alcohol. She reports that she does not use illicit drugs.   Review of Systems  Gastrointestinal: Positive for nausea and vomiting.  All other systems reviewed and are negative.     Blood pressure 110/75, pulse 72, temperature 98.3 F (36.8 C), temperature source Oral, resp. rate 18, height 5' (1.524 m), weight 169 lb (76.658 kg), last menstrual period 09/16/2013, SpO2 99 %, unknown if currently breastfeeding. Maternal Exam:  Abdomen: Patient reports  no abdominal tenderness.   Physical Exam  Nursing note and vitals reviewed. Constitutional: She is oriented to person, place, and time. She appears well-developed and well-nourished.  HENT:  Head: Normocephalic and atraumatic.  Eyes: Conjunctivae are normal. Pupils are equal, round, and reactive to light.  Neck: Normal range of motion. Neck supple.  Cardiovascular: Normal rate and regular rhythm.   Respiratory: Effort normal and breath sounds normal.  GI: Soft. Bowel sounds are normal.  Neurological: She is alert and oriented to person, place, and time.  Skin: Skin is warm and dry.  Psychiatric: She has a normal mood and affect. Her behavior is normal. Judgment and thought content normal.    Prenatal labs: ABO, Rh: --/--/B POS (02/11 1405) Antibody: NEG (02/11 1405) Rubella: 2.57 (10/21 1339) RPR: Non Reactive (02/11 1405)  HBsAg: NEGATIVE (10/21 1339)  HIV: NONREACTIVE (11/18 1556)  GBS:     Assessment/Plan: POD#1 cesarean section.  Nausea and vomiting.  Poor diabetic glucose control on Glyburide.  Admit.  Supportive management.   Emmett Bracknell A 04/21/2014, 5:52 AM

## 2014-04-28 ENCOUNTER — Encounter (HOSPITAL_COMMUNITY): Payer: Self-pay

## 2014-04-28 ENCOUNTER — Inpatient Hospital Stay (EMERGENCY_DEPARTMENT_HOSPITAL)
Admission: AD | Admit: 2014-04-28 | Discharge: 2014-04-28 | Disposition: A | Discharge status: Home / Self Care | Payer: Medicaid Other | Source: Ambulatory Visit | Attending: Obstetrics | Admitting: Obstetrics

## 2014-04-28 DIAGNOSIS — O165 Unspecified maternal hypertension, complicating the puerperium: Secondary | ICD-10-CM

## 2014-04-28 DIAGNOSIS — O169 Unspecified maternal hypertension, unspecified trimester: Secondary | ICD-10-CM

## 2014-04-28 DIAGNOSIS — F17210 Nicotine dependence, cigarettes, uncomplicated: Secondary | ICD-10-CM

## 2014-04-28 DIAGNOSIS — O9089 Other complications of the puerperium, not elsewhere classified: Secondary | ICD-10-CM

## 2014-04-28 DIAGNOSIS — I158 Other secondary hypertension: Secondary | ICD-10-CM

## 2014-04-28 LAB — COMPREHENSIVE METABOLIC PANEL
ALBUMIN: 3.3 g/dL — AB (ref 3.5–5.2)
ALK PHOS: 132 U/L — AB (ref 39–117)
ALT: 20 U/L (ref 0–35)
AST: 21 U/L (ref 0–37)
Anion gap: 6 (ref 5–15)
BILIRUBIN TOTAL: 0.4 mg/dL (ref 0.3–1.2)
BUN: 11 mg/dL (ref 6–23)
CHLORIDE: 107 mmol/L (ref 96–112)
CO2: 25 mmol/L (ref 19–32)
CREATININE: 0.52 mg/dL (ref 0.50–1.10)
Calcium: 8.7 mg/dL (ref 8.4–10.5)
GFR calc Af Amer: >90 mL/min (ref >90)
GFR calc non Af Amer: >90 mL/min (ref >90)
Glucose, Bld: 85 mg/dL (ref 70–99)
Potassium: 3.8 mmol/L (ref 3.5–5.1)
SODIUM: 138 mmol/L (ref 135–145)
Total Protein: 7.5 g/dL (ref 6.0–8.3)

## 2014-04-28 LAB — URIC ACID: Uric Acid, Serum: 5.6 mg/dL (ref 2.4–7.0)

## 2014-04-28 LAB — URINALYSIS, ROUTINE W REFLEX MICROSCOPIC
BILIRUBIN URINE: NEGATIVE
GLUCOSE, UA: NEGATIVE mg/dL
Ketones, ur: NEGATIVE mg/dL
Nitrite: NEGATIVE
PROTEIN: NEGATIVE mg/dL
Specific Gravity, Urine: 1.025 (ref 1.005–1.030)
Urobilinogen, UA: 1 mg/dL (ref 0.0–1.0)
pH: 6 (ref 5.0–8.0)

## 2014-04-28 LAB — LACTATE DEHYDROGENASE: LDH: 186 U/L (ref 94–250)

## 2014-04-28 LAB — CBC
HCT: 35.2 % — ABNORMAL LOW (ref 36.0–46.0)
HEMOGLOBIN: 11.9 g/dL — AB (ref 12.0–15.0)
MCH: 22.6 pg — AB (ref 26.0–34.0)
MCHC: 33.8 g/dL (ref 30.0–36.0)
MCV: 66.8 fL — AB (ref 78.0–100.0)
PLATELETS: 733 10*3/uL — AB (ref 150–400)
RBC: 5.27 MIL/uL — AB (ref 3.87–5.11)
RDW: 15.1 % (ref 11.5–15.5)
WBC: 4.4 10*3/uL (ref 4.0–10.5)

## 2014-04-28 LAB — URINE MICROSCOPIC-ADD ON

## 2014-04-28 LAB — PROTEIN / CREATININE RATIO, URINE
Creatinine, Urine: 130 mg/dL
PROTEIN CREATININE RATIO: 0.05 (ref 0.00–0.15)
Total Protein, Urine: 6 mg/dL

## 2014-04-28 MED ORDER — OXYCODONE-ACETAMINOPHEN 5-325 MG PO TABS
2.0000 | ORAL_TABLET | Freq: Once | ORAL | Status: AC
  Administered 2014-04-28: 2 via ORAL
  Filled 2014-04-28: qty 2

## 2014-04-28 MED ORDER — ACETAMINOPHEN-CODEINE #3 300-30 MG PO TABS: 1.0000 | ORAL_TABLET | Freq: Four times a day (QID) | ORAL | Status: DC | PRN

## 2014-04-28 NOTE — MAU Provider Note (Signed)
History     CSN: [id number]  Arrival date and time: 04/28/14 1053   None     Chief Complaint  Patient presents with  . Hypertension  . Headache   HPI   [name] is a 29 y.o. female [id number] status post un-complicated repeat cesarean section on 04/18/14. She presents with elevated BP readings and HA. The symptoms started a few days ago. The HA does not seem to worsen with position change.   History of type 2 DM.  Denies history of hypertension.   OB History    Gravida Para Term Preterm AB TAB SAB Ectopic Multiple Living   '5 2 2  3 1 2  '$ 0 2      Past Medical History  Diagnosis Date  . Pelvic inflammatory disease   . Pancreatitis     diet controlled  . Polycystic ovary syndrome   . Pancreatic cyst   . Asthma     rarely uses inhaler  . Missed ab     x 2, one requiring D & E  . 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 Bronchitis   . Bipolar disorder     HX PPD after 2014 delivery, no meds  . Anemia     Past Surgical History  Procedure Laterality Date  . Splenectomy, total  2012  . Pancreas surgery  2012    s/p partial pancreatectomy  . Cesarean section N/A 05/23/2012    Procedure: CESAREAN SECTION;  Surgeon: Lahoma Crocker, MD;  Location: [facility];  Service: Obstetrics;  Laterality: N/A;  primary  . Dilation and curettage of uterus       for MAB  . Wisdom tooth extraction    . Cesarean section N/A 04/18/2014    Procedure: REPEAT CESAREAN SECTION;  Surgeon: Shelly Bombard, MD;  Location: [facility];  Service: Obstetrics;  Laterality: N/A;    Family History  Problem Relation Age of Onset  . Diabetes Mother   . Colon cancer Neg Hx     History  Substance Use Topics  . Smoking status: Current Every Day Smoker -- 0.50 packs/day for 13 years    Types: Cigarettes  . Smokeless tobacco: Never Used  . Alcohol Use: 0.0 oz/week     Comment: social but none with pregnancy    Allergies:  Allergies   Allergen Reactions  . Dilaudid [Hydromorphone Hcl] Itching and Other (See Comments)    hallucinations "Felt like she was losing her mind"    Prescriptions prior to admission  Medication Sig Dispense Refill Last Dose  . albuterol (PROAIR HFA) 108 (90 BASE) MCG/ACT inhaler Inhale 2 puffs into the lungs every 6 (six) hours as needed for wheezing or shortness of breath.    Taking  . glyBURIDE (DIABETA) 1.25 MG tablet 1.25 mg 30 minutes before breakfast, 1.25 mg 30 minutes before lunch and 2.5 mg at bedtime. (Patient taking differently: Take 1.25-2.5 mg by mouth See admin instructions. 1.25 mg 30 minutes before breakfast, 2.5 mg 30 minutes before lunch and 2.5 mg at bedtime.) 120 tablet 2 04/17/2014 at Unknown time  . ibuprofen (ADVIL,MOTRIN) 600 MG tablet Take 1 tablet (600 mg total) by mouth every 6 (six) hours as needed. 30 tablet 5   . Iron-FA-B Cmp-C-Biot-Probiotic (FUSION PLUS) CAPS Take 1 capsule by mouth daily before breakfast. 30 capsule 5   . oxyCODONE-acetaminophen (PERCOCET/ROXICET) 5-325 MG per tablet Take 1-2 tablets by mouth every 4 (four) hours as needed for  moderate pain or severe pain (for pain scale equal to or greater than 7). 40 tablet 0   . Prenat-FeCbn-FeAspGl-FA-Omega (OB COMPLETE PETITE) 35-5-1-200 MG CAPS Take 1 capsule by mouth daily. 30 capsule 11 Taking   Results for orders placed or performed during the hospital encounter of 04/28/14 (from the past 48 hour(s))  CBC     Status: Abnormal   Collection Time: 04/28/14 11:35 AM  Result Value Ref Range   WBC 4.4 4.0 - 10.5 K/uL   RBC 5.27 (H) 3.87 - 5.11 MIL/uL   Hemoglobin 11.9 (L) 12.0 - 15.0 g/dL   HCT 60.1 (L) 46.2 - 41.5 %   MCV 66.8 (L) 78.0 - 100.0 fL   MCH 22.6 (L) 26.0 - 34.0 pg   MCHC 33.8 30.0 - 36.0 g/dL   RDW 12.5 01.2 - 22.8 %   Platelets 733 (H) 150 - 400 K/uL  Comprehensive metabolic panel     Status: Abnormal   Collection Time: 04/28/14 11:35 AM  Result Value Ref Range   Sodium 138 135 - 145 mmol/L    Potassium 3.8 3.5 - 5.1 mmol/L   Chloride 107 96 - 112 mmol/L   CO2 25 19 - 32 mmol/L   Glucose, Bld 85 70 - 99 mg/dL   BUN 11 6 - 23 mg/dL   Creatinine, Ser 4.49 0.50 - 1.10 mg/dL   Calcium 8.7 8.4 - 02.0 mg/dL   Total Protein 7.5 6.0 - 8.3 g/dL   Albumin 3.3 (L) 3.5 - 5.2 g/dL   AST 21 0 - 37 U/L   ALT 20 0 - 35 U/L   Alkaline Phosphatase 132 (H) 39 - 117 U/L   Total Bilirubin 0.4 0.3 - 1.2 mg/dL   GFR calc non Af Amer >90 >90 mL/min   GFR calc Af Amer >90 >90 mL/min    Comment: (NOTE) The eGFR has been calculated using the CKD EPI equation. This calculation has not been validated in all clinical situations. eGFR's persistently <90 mL/min signify possible Chronic Kidney Disease.    Anion gap 6 5 - 15  Uric acid     Status: None   Collection Time: 04/28/14 11:35 AM  Result Value Ref Range   Uric Acid, Serum 5.6 2.4 - 7.0 mg/dL  Lactate dehydrogenase     Status: None   Collection Time: 04/28/14 11:35 AM  Result Value Ref Range   LDH 186 94 - 250 U/L  Urinalysis, Routine w reflex microscopic     Status: Abnormal   Collection Time: 04/28/14 11:55 AM  Result Value Ref Range   Color, Urine YELLOW YELLOW   APPearance CLEAR CLEAR   Specific Gravity, Urine 1.025 1.005 - 1.030   pH 6.0 5.0 - 8.0   Glucose, UA NEGATIVE NEGATIVE mg/dL   Hgb urine dipstick MODERATE (A) NEGATIVE   Bilirubin Urine NEGATIVE NEGATIVE   Ketones, ur NEGATIVE NEGATIVE mg/dL   Protein, ur NEGATIVE NEGATIVE mg/dL   Urobilinogen, UA 1.0 0.0 - 1.0 mg/dL   Nitrite NEGATIVE NEGATIVE   Leukocytes, UA TRACE (A) NEGATIVE  Protein / creatinine ratio, urine     Status: None   Collection Time: 04/28/14 11:55 AM  Result Value Ref Range   Creatinine, Urine 130.00 mg/dL   Total Protein, Urine 6 mg/dL    Comment: NO NORMAL RANGE ESTABLISHED FOR THIS TEST   Protein Creatinine Ratio 0.05 0.00 - 0.15  Urine microscopic-add on     Status: None   Collection Time: 04/28/14 11:55 AM  Result Value Ref Range    Squamous Epithelial / LPF RARE RARE   WBC, UA 0-2 <3 WBC/hpf   Bacteria, UA RARE RARE    Review of Systems  Constitutional: Negative for fever and chills.  Eyes: Positive for blurred vision (Seeing spots ).  Respiratory: Negative for shortness of breath.   Cardiovascular: Negative for leg swelling.  Gastrointestinal: Negative for nausea, vomiting and abdominal pain.  Genitourinary: Negative for dysuria.  Neurological: Positive for dizziness and headaches.   Physical Exam   Blood pressure 136/82, pulse 65, temperature 98.3 F (36.8 C), temperature source Oral, resp. rate 18, last menstrual period 09/16/2013, unknown if currently breastfeeding.   Today's Vitals   04/28/14 1245 04/28/14 1300 04/28/14 1315 04/28/14 1352  BP: 136/93 142/92 137/86 136/82  Pulse: 57 59 51 65  Temp:      TempSrc:      Resp:      PainSc:    0-No pain    Physical Exam  Constitutional: She is oriented to person, place, and time. She appears well-developed and well-nourished. No distress.  HENT:  Head: Normocephalic.  Eyes: Pupils are equal, round, and reactive to light.  Neck: Neck supple.  Cardiovascular: Normal rate and normal heart sounds.   Respiratory: Effort normal and breath sounds normal. No respiratory distress.  Musculoskeletal: Normal range of motion.       Right ankle: She exhibits no swelling.       Left ankle: She exhibits no swelling.  Neurological: She is alert and oriented to person, place, and time. She has normal reflexes.  Skin: Skin is warm. She is not diaphoretic.  Psychiatric: Her behavior is normal.    MAU Course  Procedures  None  MDM Percocet 2 tabs given with significant relief. Patient says she can tolerate the pain Discussed that her urine shows an elevated specific gravity and that she needs to increase her fluid intake.   Discussed patient with Dr. Jodi Mourning. Discussed labs, BP readings and plan of care   Assessment and Plan   A:  1. Postpartum hypertension      P:  Discharge home in stable condition Call Dr. Jacelyn Grip office today and schedule an appointment tomorrow morning for blood pressure check RX: Tylenol #3 Preeclampsia precautions Return to MAU if symptoms worsen Increase fluid intake.    Darrelyn Hillock Rifky Lapre, NP 04/28/2014 3:50 PM

## 2014-04-28 NOTE — MAU Note (Signed)
C/S on 2/12, home health nurse came to see pt, BP has been elevated since last Wednesday, pt has had HA since she left the hospital.  Pt checked her BP this morning, was 161/109, has HA, also floaters.  Pt states bleeding is WNL, no problems with incision.

## 2014-04-28 NOTE — Discharge Instructions (Signed)

## 2014-04-29 ENCOUNTER — Encounter (HOSPITAL_COMMUNITY): Payer: Self-pay | Admitting: *Deleted

## 2014-04-29 ENCOUNTER — Inpatient Hospital Stay (HOSPITAL_COMMUNITY)
Admission: AD | Admit: 2014-04-29 | Discharge: 2014-05-03 | DRG: 776 | Disposition: A | Discharge status: Home / Self Care | Payer: Medicaid Other | Source: Ambulatory Visit | Attending: Obstetrics | Admitting: Obstetrics

## 2014-04-29 ENCOUNTER — Telehealth: Payer: Self-pay | Admitting: *Deleted

## 2014-04-29 DIAGNOSIS — E1165 Type 2 diabetes mellitus with hyperglycemia: Secondary | ICD-10-CM | POA: Diagnosis present

## 2014-04-29 DIAGNOSIS — J45909 Unspecified asthma, uncomplicated: Secondary | ICD-10-CM | POA: Diagnosis present

## 2014-04-29 DIAGNOSIS — O99345 Other mental disorders complicating the puerperium: Secondary | ICD-10-CM | POA: Diagnosis present

## 2014-04-29 DIAGNOSIS — F319 Bipolar disorder, unspecified: Secondary | ICD-10-CM | POA: Diagnosis present

## 2014-04-29 DIAGNOSIS — O152 Eclampsia in the puerperium: Secondary | ICD-10-CM | POA: Diagnosis present

## 2014-04-29 DIAGNOSIS — O9953 Diseases of the respiratory system complicating the puerperium: Secondary | ICD-10-CM | POA: Diagnosis present

## 2014-04-29 DIAGNOSIS — F17210 Nicotine dependence, cigarettes, uncomplicated: Secondary | ICD-10-CM | POA: Diagnosis present

## 2014-04-29 DIAGNOSIS — O99335 Smoking (tobacco) complicating the puerperium: Secondary | ICD-10-CM | POA: Diagnosis present

## 2014-04-29 DIAGNOSIS — O1490 Unspecified pre-eclampsia, unspecified trimester: Secondary | ICD-10-CM | POA: Diagnosis present

## 2014-04-29 LAB — COMPREHENSIVE METABOLIC PANEL
ALBUMIN: 3.4 g/dL — AB (ref 3.5–5.2)
ALK PHOS: 134 U/L — AB (ref 39–117)
ALT: 19 U/L (ref 0–35)
ANION GAP: 3 — AB (ref 5–15)
AST: 22 U/L (ref 0–37)
BUN: 11 mg/dL (ref 6–23)
CO2: 24 mmol/L (ref 19–32)
Calcium: 9 mg/dL (ref 8.4–10.5)
Chloride: 109 mmol/L (ref 96–112)
Creatinine, Ser: 0.57 mg/dL (ref 0.50–1.10)
GFR calc Af Amer: >90 mL/min (ref >90)
GFR calc non Af Amer: >90 mL/min (ref >90)
GLUCOSE: 80 mg/dL (ref 70–99)
Potassium: 4.5 mmol/L (ref 3.5–5.1)
Sodium: 136 mmol/L (ref 135–145)
Total Bilirubin: 0.5 mg/dL (ref 0.3–1.2)
Total Protein: 7.4 g/dL (ref 6.0–8.3)

## 2014-04-29 LAB — URINALYSIS, ROUTINE W REFLEX MICROSCOPIC
Bilirubin Urine: NEGATIVE
GLUCOSE, UA: NEGATIVE mg/dL
KETONES UR: NEGATIVE mg/dL
LEUKOCYTES UA: NEGATIVE
Nitrite: NEGATIVE
PH: 5.5 (ref 5.0–8.0)
Protein, ur: NEGATIVE mg/dL
Specific Gravity, Urine: 1.025 (ref 1.005–1.030)
Urobilinogen, UA: 0.2 mg/dL (ref 0.0–1.0)

## 2014-04-29 LAB — URINE MICROSCOPIC-ADD ON

## 2014-04-29 LAB — CBC
HCT: 36.1 % (ref 36.0–46.0)
Hemoglobin: 12.5 g/dL (ref 12.0–15.0)
MCH: 23 pg — ABNORMAL LOW (ref 26.0–34.0)
MCHC: 34.6 g/dL (ref 30.0–36.0)
MCV: 66.5 fL — AB (ref 78.0–100.0)
Platelets: 746 10*3/uL — ABNORMAL HIGH (ref 150–400)
RBC: 5.43 MIL/uL — ABNORMAL HIGH (ref 3.87–5.11)
RDW: 15.3 % (ref 11.5–15.5)
WBC: 6 10*3/uL (ref 4.0–10.5)

## 2014-04-29 LAB — PROTEIN / CREATININE RATIO, URINE
Creatinine, Urine: 161 mg/dL
Total Protein, Urine: <6 mg/dL

## 2014-04-29 LAB — GLUCOSE, CAPILLARY: Glucose-Capillary: 281 mg/dL — ABNORMAL HIGH (ref 70–99)

## 2014-04-29 LAB — URIC ACID: Uric Acid, Serum: 5.8 mg/dL (ref 2.4–7.0)

## 2014-04-29 LAB — LACTATE DEHYDROGENASE: LDH: 215 U/L (ref 94–250)

## 2014-04-29 MED ORDER — GLYBURIDE 2.5 MG PO TABS: 2.5000 mg | ORAL_TABLET | Freq: Every day | ORAL | Status: DC

## 2014-04-29 MED ORDER — GLYBURIDE 1.25 MG PO TABS: 1.2500 mg | ORAL_TABLET | Freq: Every day | ORAL | Status: DC

## 2014-04-29 MED ORDER — METOCLOPRAMIDE HCL 5 MG/ML IJ SOLN
10.0000 mg | Freq: Once | INTRAMUSCULAR | Status: AC
  Administered 2014-04-29: 10 mg via INTRAVENOUS
  Filled 2014-04-29: qty 2

## 2014-04-29 MED ORDER — LACTATED RINGERS IV SOLN
INTRAVENOUS | Status: DC
  Administered 2014-04-29 – 2014-05-01 (×5): via INTRAVENOUS

## 2014-04-29 MED ORDER — GLYBURIDE 1.25 MG PO TABS
1.2500 mg | ORAL_TABLET | Freq: Every day | ORAL | Status: DC
  Filled 2014-04-29: qty 1

## 2014-04-29 MED ORDER — OXYCODONE-ACETAMINOPHEN 5-325 MG PO TABS
1.0000 | ORAL_TABLET | ORAL | Status: AC
  Administered 2014-04-29: 1 via ORAL
  Filled 2014-04-29: qty 1

## 2014-04-29 MED ORDER — LABETALOL HCL 5 MG/ML IV SOLN
40.0000 mg | Freq: Once | INTRAVENOUS | Status: AC
  Administered 2014-04-29: 20 mg via INTRAVENOUS
  Filled 2014-04-29: qty 8

## 2014-04-29 MED ORDER — LABETALOL HCL 5 MG/ML IV SOLN
20.0000 mg | Freq: Once | INTRAVENOUS | Status: AC
  Administered 2014-04-29: 20 mg via INTRAVENOUS
  Filled 2014-04-29: qty 4

## 2014-04-29 MED ORDER — MAGNESIUM SULFATE BOLUS VIA INFUSION
4.0000 g | Freq: Once | INTRAVENOUS | Status: AC
  Administered 2014-04-29: 4 g via INTRAVENOUS
  Filled 2014-04-29: qty 500

## 2014-04-29 MED ORDER — LABETALOL HCL 200 MG PO TABS
200.0000 mg | ORAL_TABLET | Freq: Three times a day (TID) | ORAL | Status: DC
  Administered 2014-04-29 – 2014-04-30 (×2): 200 mg via ORAL
  Filled 2014-04-29 (×6): qty 1

## 2014-04-29 MED ORDER — MAGNESIUM SULFATE 40 G IN LACTATED RINGERS - SIMPLE
2.0000 g/h | INTRAVENOUS | Status: DC
  Administered 2014-04-29 – 2014-04-30 (×2): 2 g/h via INTRAVENOUS
  Filled 2014-04-29 (×2): qty 500

## 2014-04-29 MED ORDER — DIPHENHYDRAMINE HCL 50 MG/ML IJ SOLN
12.5000 mg | Freq: Once | INTRAMUSCULAR | Status: AC
  Administered 2014-04-29: 12.5 mg via INTRAVENOUS
  Filled 2014-04-29: qty 1

## 2014-04-29 MED ORDER — DEXAMETHASONE SODIUM PHOSPHATE 10 MG/ML IJ SOLN
10.0000 mg | Freq: Once | INTRAMUSCULAR | Status: AC
  Administered 2014-04-29: 10 mg via INTRAVENOUS
  Filled 2014-04-29: qty 1

## 2014-04-29 MED ORDER — OXYCODONE-ACETAMINOPHEN 5-325 MG PO TABS
1.0000 | ORAL_TABLET | ORAL | Status: DC | PRN
  Administered 2014-04-30 (×2): 1 via ORAL
  Administered 2014-04-30: 2 via ORAL
  Administered 2014-04-30: 1 via ORAL
  Administered 2014-05-01: 2 via ORAL
  Administered 2014-05-02: 1 via ORAL
  Administered 2014-05-02: 2 via ORAL
  Administered 2014-05-02: 1 via ORAL
  Filled 2014-04-29 (×2): qty 2
  Filled 2014-04-29 (×4): qty 1
  Filled 2014-04-29: qty 2
  Filled 2014-04-29: qty 1

## 2014-04-29 MED ORDER — ZOLPIDEM TARTRATE 5 MG PO TABS: 5.0000 mg | ORAL_TABLET | Freq: Every evening | ORAL | Status: DC | PRN

## 2014-04-29 MED ORDER — LACTATED RINGERS IV BOLUS (SEPSIS)
1000.0000 mL | Freq: Once | INTRAVENOUS | Status: AC
  Administered 2014-04-29 (×2): 1000 mL via INTRAVENOUS

## 2014-04-29 MED ORDER — GLYBURIDE 1.25 MG PO TABS
1.2500 mg | ORAL_TABLET | Freq: Two times a day (BID) | ORAL | Status: DC
  Filled 2014-04-29 (×4): qty 1

## 2014-04-29 MED ORDER — PRENATAL MULTIVITAMIN CH
1.0000 | ORAL_TABLET | Freq: Every day | ORAL | Status: DC
Start: 2014-04-30 — End: 2014-05-03
  Administered 2014-04-30 – 2014-05-02 (×3): 1 via ORAL
  Filled 2014-04-29 (×3): qty 1

## 2014-04-29 MED ORDER — GLYBURIDE 2.5 MG PO TABS
2.5000 mg | ORAL_TABLET | Freq: Every day | ORAL | Status: DC
  Administered 2014-04-30: 2.5 mg via ORAL
  Filled 2014-04-29 (×2): qty 1

## 2014-04-29 NOTE — MAU Note (Signed)
B-ROOM

## 2014-04-29 NOTE — MAU Provider Note (Addendum)
History     CSN: [id number]  Arrival date and time: 04/29/14 1418   First Provider Initiated Contact with Patient 04/29/14 1537      Chief Complaint  Patient presents with  . Hypertension   HPI [name] 29 y.o. [id number] s/p csection on 2/12 presents to MAU with complaints of elevated blood pressure and headache.  She is checking her blood pressure at home with an electronic cuffas well as having the [employer] nurse check it.  BP was elevated with a high of 161/109 yesterday.  The left side of her head has been hurting for 7 days, 10/10 with throbbing, worse with bright lights, no change with noises or movement.  Percocet this morning was helpful to 5-6/10.   OB History    Gravida Para Term Preterm AB TAB SAB Ectopic Multiple Living   5 2 2  3 1 2   0 2      Past Medical History  Diagnosis Date  . Pelvic inflammatory disease   . Pancreatitis     diet controlled  . Polycystic ovary syndrome   . Pancreatic cyst   . Asthma     rarely uses inhaler  . Missed ab     x 2, one requiring D & E  . 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 Bronchitis   . Bipolar disorder     HX PPD after 2014 delivery, no meds  . Anemia     Past Surgical History  Procedure Laterality Date  . Splenectomy, total  2012  . Pancreas surgery  2012    s/p partial pancreatectomy  . Cesarean section N/A 05/23/2012    Procedure: CESAREAN SECTION;  Surgeon: Lahoma Crocker, MD;  Location: [facility];  Service: Obstetrics;  Laterality: N/A;  primary  . Dilation and curettage of uterus       for MAB  . Wisdom tooth extraction    . Cesarean section N/A 04/18/2014    Procedure: REPEAT CESAREAN SECTION;  Surgeon: Shelly Bombard, MD;  Location: [facility];  Service: Obstetrics;  Laterality: N/A;    Family History  Problem Relation Age of Onset  . Diabetes Mother   . Colon cancer Neg Hx     History  Substance Use Topics  . Smoking status: Current Every  Day Smoker -- 0.50 packs/day for 13 years    Types: Cigarettes  . Smokeless tobacco: Never Used  . Alcohol Use: 0.0 oz/week     Comment: social but none with pregnancy    Allergies:  Allergies  Allergen Reactions  . Dilaudid [Hydromorphone Hcl] Itching and Other (See Comments)    hallucinations "Felt like she was losing her mind"    Prescriptions prior to admission  Medication Sig Dispense Refill Last Dose  . glyBURIDE (DIABETA) 1.25 MG tablet 1.25 mg 30 minutes before breakfast, 1.25 mg 30 minutes before lunch and 2.5 mg at bedtime. (Patient taking differently: Take 1.25-2.5 mg by mouth See admin instructions. 1.25 mg 30 minutes before breakfast, 2.5 mg 30 minutes before lunch and 2.5 mg at bedtime.) 120 tablet 2 04/28/2014 at Unknown time  . oxyCODONE-acetaminophen (PERCOCET/ROXICET) 5-325 MG per tablet Take 1-2 tablets by mouth every 4 (four) hours as needed for moderate pain or severe pain (for pain scale equal to or greater than 7). 40 tablet 0 04/29/2014 at Unknown time  . Prenat-FeCbn-FeAspGl-FA-Omega (OB COMPLETE PETITE) 35-5-1-200 MG CAPS Take 1 capsule by mouth daily. 30 capsule 11 04/28/2014 at  Unknown time  . acetaminophen-codeine (TYLENOL #3) 300-30 MG per tablet Take 1-2 tablets by mouth every 6 (six) hours as needed for moderate pain. 15 tablet 0   . albuterol (PROAIR HFA) 108 (90 BASE) MCG/ACT inhaler Inhale 2 puffs into the lungs every 6 (six) hours as needed for wheezing or shortness of breath.    More than a month at Unknown time  . Iron-FA-B Cmp-C-Biot-Probiotic (FUSION PLUS) CAPS Take 1 capsule by mouth daily before breakfast. (Patient not taking: Reported on 04/28/2014) 30 capsule 5     Review of Systems  Constitutional: Negative for fever.  HENT: Negative for congestion and sore throat.   Respiratory: Negative for cough and shortness of breath.   Cardiovascular: Negative for chest pain.  Gastrointestinal: Negative for heartburn, nausea and vomiting.  Genitourinary:  Negative for dysuria.  Neurological: Positive for headaches. Negative for dizziness, tingling and weakness.   Physical Exam   Blood pressure 136/93, pulse 63, temperature 99 F (37.2 C), temperature source Oral, resp. rate 18, height 5' 0.63" (1.54 m), weight 146 lb (66.225 kg), last menstrual period 09/16/2013, SpO2 100 %, currently breastfeeding.  Physical Exam  Constitutional: She is oriented to person, place, and time. She appears well-developed and well-nourished. No distress.  HENT:  Head: Atraumatic.  Eyes: EOM are normal.  Cardiovascular: Normal rate and regular rhythm.   Respiratory: Effort normal and breath sounds normal. No respiratory distress.  GI: Soft. Bowel sounds are normal. She exhibits no distension. There is no tenderness. There is no rebound and no guarding.  Incision from cesarean appears to be closed and healing well.  There is some central swelling but no erythema or warmth and no drainage.    Musculoskeletal: Normal range of motion.  Neurological: She is alert and oriented to person, place, and time.  Skin: Skin is warm and dry.  Psychiatric: She has a normal mood and affect.   Results for orders placed or performed during the hospital encounter of 04/29/14 (from the past 24 hour(s))  Urinalysis, Routine w reflex microscopic     Status: Abnormal   Collection Time: 04/29/14  2:27 PM  Result Value Ref Range   Color, Urine YELLOW YELLOW   APPearance CLEAR CLEAR   Specific Gravity, Urine 1.025 1.005 - 1.030   pH 5.5 5.0 - 8.0   Glucose, UA NEGATIVE NEGATIVE mg/dL   Hgb urine dipstick MODERATE (A) NEGATIVE   Bilirubin Urine NEGATIVE NEGATIVE   Ketones, ur NEGATIVE NEGATIVE mg/dL   Protein, ur NEGATIVE NEGATIVE mg/dL   Urobilinogen, UA 0.2 0.0 - 1.0 mg/dL   Nitrite NEGATIVE NEGATIVE   Leukocytes, UA NEGATIVE NEGATIVE  Protein / creatinine ratio, urine     Status: None   Collection Time: 04/29/14  2:27 PM  Result Value Ref Range   Creatinine, Urine 161.00  mg/dL   Total Protein, Urine <6 mg/dL   Protein Creatinine Ratio        0.00 - 0.15  Urine microscopic-add on     Status: Abnormal   Collection Time: 04/29/14  2:27 PM  Result Value Ref Range   Squamous Epithelial / LPF RARE RARE   WBC, UA 0-2 <3 WBC/hpf   RBC / HPF 3-6 <3 RBC/hpf   Bacteria, UA FEW (A) RARE   Urine-Other MUCOUS PRESENT   CBC     Status: Abnormal   Collection Time: 04/29/14  4:20 PM  Result Value Ref Range   WBC 6.0 4.0 - 10.5 K/uL   RBC 5.43 (H) 3.87 -  5.11 MIL/uL   Hemoglobin 12.5 12.0 - 15.0 g/dL   HCT 36.1 36.0 - 46.0 %   MCV 66.5 (L) 78.0 - 100.0 fL   MCH 23.0 (L) 26.0 - 34.0 pg   MCHC 34.6 30.0 - 36.0 g/dL   RDW 15.3 11.5 - 15.5 %   Platelets 746 (H) 150 - 400 K/uL  Comprehensive metabolic panel     Status: Abnormal   Collection Time: 04/29/14  4:20 PM  Result Value Ref Range   Sodium 136 135 - 145 mmol/L   Potassium 4.5 3.5 - 5.1 mmol/L   Chloride 109 96 - 112 mmol/L   CO2 24 19 - 32 mmol/L   Glucose, Bld 80 70 - 99 mg/dL   BUN 11 6 - 23 mg/dL   Creatinine, Ser 0.57 0.50 - 1.10 mg/dL   Calcium 9.0 8.4 - 10.5 mg/dL   Total Protein 7.4 6.0 - 8.3 g/dL   Albumin 3.4 (L) 3.5 - 5.2 g/dL   AST 22 0 - 37 U/L   ALT 19 0 - 35 U/L   Alkaline Phosphatase 134 (H) 39 - 117 U/L   Total Bilirubin 0.5 0.3 - 1.2 mg/dL   GFR calc non Af Amer >90 >90 mL/min   GFR calc Af Amer >90 >90 mL/min   Anion gap 3 (L) 5 - 15  Uric acid     Status: None   Collection Time: 04/29/14  4:20 PM  Result Value Ref Range   Uric Acid, Serum 5.8 2.4 - 7.0 mg/dL  Lactate dehydrogenase     Status: None   Collection Time: 04/29/14  4:20 PM  Result Value Ref Range   LDH 215 94 - 250 U/L    MAU Course  Procedures  MDM Percocet ordered to address HA for pt.   Consulted with Dr. Jodi Mourning.  He advises to begin IV.  Given 2 Liters of fluid, Headache cocktail of Decadron, Reglan and Benadryl as well as Labetalol 20mg  slow IV push.   Pt notes improvement in HA to 0/10.  Blood pressures  continue to be elevated.   Dr. Jodi Mourning consulted again.  Labs reviewed as well as response of HA and BP to medication.  BP continues to be elevated at 160/93.  He advises for 40mg  Labetalol slow IV push.  Continue to monitor and report back.   Blood pressure responded favorably initially but then elevated again.  Dr. Jodi Mourning consulted again.  He will admit patient  Assessment and Plan  A: Postpartum atypical nonproteinuria preeclampsia  P: Admit to AICU - orders per Dr. Loree Fee, Collene Leyden  04/29/2014, 3:37 PM

## 2014-04-29 NOTE — MAU Note (Signed)
PT breastfeeding and talking on phone. Difficult to monitor b/p

## 2014-04-29 NOTE — Telephone Encounter (Signed)
Patient called she had a C section on 04/18/2014. Patient reports she has had elevated BP since she left the hospital. Patient was seen at MAU yesterday and evaluated. She was stable and discharged home. She state she still has severe headache and dizziness. Her BP at its highest was 161/109. Per Dr Jodi Mourning- she is to go back for reevaluation . Patient instructed and she is going back.

## 2014-04-29 NOTE — MAU Note (Signed)
Called [name] and was told to come back. Has HA since last Tuesday, light sensitive..  At times vision is blurry.  C/s 02/12. BP was not up then, went up after delivery.

## 2014-04-29 NOTE — MAU Note (Signed)
Urine in lab 

## 2014-04-29 NOTE — MAU Note (Signed)
[facility] -  PB

## 2014-04-29 NOTE — H&P (Addendum)
HPI [name], S/P C/S on 04-18-14.  Presents with persistent HA and elevated BP's at home.  PMH:      PID      Pancretitis      Asthma      Diabetes      Bronchitis      Bipolar Disorder  PSH:      Splenectomy      Partial pancreatectomy       C/S      D&C      C/S  Review of Systems  Constitutional: Negative for fever.  HENT: Negative for congestion and sore throat.  Respiratory: Negative for cough and shortness of breath.  Cardiovascular: Negative for chest pain.  Gastrointestinal: Negative for heartburn, nausea and vomiting.  Genitourinary: Negative for dysuria.  Neurological: Positive for headaches. Negative for dizziness, tingling and weakness.   Physical Exam   Blood pressure 136/93, pulse 63, temperature 99 F (37.2 C), temperature source Oral, resp. rate 18, height 5' 0.63" (1.54 m), weight 146 lb (66.225 kg), last menstrual period 09/16/2013, SpO2 100 %, currently breastfeeding.  Physical Exam  Constitutional: She is oriented to person, place, and time. She appears well-developed and well-nourished. No distress.  HENT:  Head: Atraumatic.  Eyes: EOM are normal.  Cardiovascular: Normal rate and regular rhythm.  Respiratory: Effort normal and breath sounds normal. No respiratory distress.  GI: Soft. Bowel sounds are normal. She exhibits no distension. There is no tenderness. There is no rebound and no guarding.  Incision from cesarean appears to be closed and healing well. There is some central swelling but no erythema or warmth and no drainage.  Musculoskeletal: Normal range of motion.  Neurological: She is alert and oriented to person, place, and time.  Skin: Skin is warm and dry.  Psychiatric: She has a normal mood and affect.    Lab Results Last 24 Hours         Results for orders placed or performed during the hospital encounter of 04/29/14 (from the past 24 hour(s))  Urinalysis, Routine w reflex microscopic     Status: Abnormal   Collection Time: 04/29/14  2:27 PM  Result Value Ref Range   Color, Urine YELLOW YELLOW   APPearance CLEAR CLEAR   Specific Gravity, Urine 1.025 1.005 - 1.030   pH 5.5 5.0 - 8.0   Glucose, UA NEGATIVE NEGATIVE mg/dL   Hgb urine dipstick MODERATE (A) NEGATIVE   Bilirubin Urine NEGATIVE NEGATIVE   Ketones, ur NEGATIVE NEGATIVE mg/dL   Protein, ur NEGATIVE NEGATIVE mg/dL   Urobilinogen, UA 0.2 0.0 - 1.0 mg/dL   Nitrite NEGATIVE NEGATIVE   Leukocytes, UA NEGATIVE NEGATIVE  Protein / creatinine ratio, urine     Status: None   Collection Time: 04/29/14  2:27 PM  Result Value Ref Range   Creatinine, Urine 161.00 mg/dL   Total Protein, Urine <6 mg/dL   Protein Creatinine Ratio        0.00 - 0.15  Urine microscopic-add on     Status: Abnormal   Collection Time: 04/29/14  2:27 PM  Result Value Ref Range   Squamous Epithelial / LPF RARE RARE   WBC, UA 0-2 <3 WBC/hpf   RBC / HPF 3-6 <3 RBC/hpf   Bacteria, UA FEW (A) RARE   Urine-Other MUCOUS PRESENT   CBC     Status: Abnormal   Collection Time: 04/29/14  4:20 PM  Result Value Ref Range   WBC 6.0 4.0 - 10.5 K/uL   RBC 5.43 (  H) 3.87 - 5.11 MIL/uL   Hemoglobin 12.5 12.0 - 15.0 g/dL   HCT 36.1 36.0 - 46.0 %   MCV 66.5 (L) 78.0 - 100.0 fL   MCH 23.0 (L) 26.0 - 34.0 pg   MCHC 34.6 30.0 - 36.0 g/dL   RDW 15.3 11.5 - 15.5 %   Platelets 746 (H) 150 - 400 K/uL  Comprehensive metabolic panel     Status: Abnormal   Collection Time: 04/29/14  4:20 PM  Result Value Ref Range   Sodium 136 135 - 145 mmol/L   Potassium 4.5 3.5 - 5.1 mmol/L   Chloride 109 96 - 112 mmol/L   CO2 24 19 - 32 mmol/L   Glucose, Bld 80 70 - 99 mg/dL   BUN 11 6 - 23 mg/dL   Creatinine, Ser 0.57 0.50 - 1.10 mg/dL   Calcium 9.0 8.4 - 10.5 mg/dL   Total Protein 7.4 6.0 - 8.3 g/dL   Albumin 3.4 (L) 3.5 - 5.2 g/dL   AST 22 0 - 37 U/L   ALT 19 0 - 35 U/L   Alkaline Phosphatase 134 (H) 39 - 117 U/L   Total Bilirubin 0.5 0.3 - 1.2 mg/dL   GFR calc non Af Amer >90 >90  mL/min   GFR calc Af Amer >90 >90 mL/min   Anion gap 3 (L) 5 - 15  Uric acid     Status: None   Collection Time: 04/29/14  4:20 PM  Result Value Ref Range   Uric Acid, Serum 5.8 2.4 - 7.0 mg/dL  Lactate dehydrogenase     Status: None   Collection Time: 04/29/14  4:20 PM  Result Value Ref Range   LDH 215 94 - 250 U/L   A/P:  Atypical, non proteinuric preeclampsia.   Admit to AICU for BP management.  Magnesium sulfate started for seizure prophylaxis.  Baltazar Najjar MD

## 2014-04-30 LAB — GLUCOSE, CAPILLARY
GLUCOSE-CAPILLARY: 95 mg/dL (ref 70–99)
GLUCOSE-CAPILLARY: 92 mg/dL (ref 70–99)
Glucose-Capillary: 52 mg/dL — ABNORMAL LOW (ref 70–99)
Glucose-Capillary: 79 mg/dL (ref 70–99)
Glucose-Capillary: 114 mg/dL — ABNORMAL HIGH (ref 70–99)

## 2014-04-30 MED ORDER — GLYBURIDE 1.25 MG PO TABS
1.2500 mg | ORAL_TABLET | Freq: Every day | ORAL | Status: DC
  Administered 2014-05-01: 1.25 mg via ORAL
  Filled 2014-04-30 (×2): qty 1

## 2014-04-30 MED ORDER — LABETALOL HCL 200 MG PO TABS
200.0000 mg | ORAL_TABLET | Freq: Two times a day (BID) | ORAL | Status: DC
  Administered 2014-05-01 – 2014-05-03 (×5): 200 mg via ORAL
  Filled 2014-04-30 (×8): qty 1

## 2014-04-30 NOTE — Progress Notes (Signed)
Pt update given to Dr. Jodi Mourning regarding BP's and CBG's.  Also, discussed conversations had with both pt and Diabetes Coordinator through out the day. See new orders. Pt verbalized understanding and agreeable with POC.

## 2014-04-30 NOTE — Progress Notes (Signed)
UR chart review completed.  

## 2014-04-30 NOTE — Progress Notes (Signed)
Hypoglycemic Event  CBG: 52  Treatment: 15 GM carbohydrate snack  Symptoms: Shaky  Follow-up CBG: Time: 0825 CBG Result: 79  Possible Reasons for Event: Medication regimen: pt has changed her Glyburide schedule several times since delivery  Comments/MD notified:  Dr. Jodi Mourning notified when in on rounds-no new orders received.      [name] A  Remember to initiate Hypoglycemia Order Set & complete

## 2014-04-30 NOTE — Progress Notes (Addendum)
Inpatient Diabetes Program Recommendations  AACE/ADA: New Consensus Statement on Inpatient Glycemic Control (2013)  Target Ranges:  Prepandial:   less than 140 mg/dL      Peak postprandial:   less than 180 mg/dL (1-2 hours)      Critically ill patients:  140 - 180 mg/dL   Results for [name], [name] (MRN [id number]) as of 04/30/2014 08:03  Ref. Range 04/29/2014 23:29  Glucose-Capillary Latest Range: 70-99 mg/dL 281 (H)   Results for [name], [name] (MRN [id number]) as of 04/30/2014 08:03  Ref. Range 04/28/2014 11:35 04/29/2014 16:20  Glucose Latest Range: 70-99 mg/dL 85 80   Reason for Visit:Consult  Outpatient Diabetes medications: Glyburide 1.25 mg with breakfast; if CBGs running high would take Glyburide 1.25 mg with supper Current orders for Inpatient glycemic control: Glyburide 1.25 mg with breakfast and lunch, Glyburide 2.5 mg QHS  Inpatient Diabetes Program Recommendations Correction (SSI): Please order CBGs five times per day (8am,  12pm. 5pm, 10pm, 2am). Oral Agents: Since fasting glucose was 52 mg/dl at 8:08 am, please change Glyburide to 1.25 mg daily with breakfast. Diet: Please continue Regular diet at this time.  Note: In reviewing the chart, noted that Rosita Kea, RN, CDE (Inpatient Diabetes Coordinator) talked with patient at length on 04/21/14 (see note for details). Patient's initial glucose was 85 mg/dl at 11:35 yesterday. Also noted order for Decadron 10 mg x 1 at 16:15 on 2/23. CBG of 281 mg/dl noted at 23:29; elevation likely due to Decadron. Talked with patient over the phone to inquire about glycemic control and DM medications taken since she was discharged from the hospital after delivery. Patient verifies that she was given steroids yesterday afternoon (not sure of the name of steroid). Patient states that she was discharged from the hospital after delivery her glucose has been trending "pretty good" with lowest glucose of 40 mg/dl and max of 202 mg/dl. Patient  states that she feels the 40 mg/dl was a result of taking Glyburide and restricting carbohydrates.Patient states that she is breastfeeding; which is also going to help glucose control.  Patient states that she is followed by [facility] doctors since she had a partial pancreatectomy in 2012 and she states that she has her next appointment at [facility] in May. Discussed effects of steroids on glucose control and explained that I felt the elevated glucose noted of 281 mg/dl was likely due to steroids. Informed patient I would recommend that the Glyburide 1.25 mg be given once a day with breakfast and that CBG be checked 5x day (before meals, at bedtime, and 2am). Patient asked to notify nursing staff if at any time she felt her glucose was low. Patient verbalized understanding of information discussed and states that she has no further questions at this time related to diabetes. Marquis Buggy, RN to inform about discussion with patient and recommendations. Will continue to follow and make further recommendations as more data is collected.  Thanks, Barnie Alderman, RN, MSN, CCRN, CDE Diabetes Coordinator Inpatient Diabetes Program [phone] (Team Pager) [phone] ([facility] office) [phone] [facility] office)

## 2014-04-30 NOTE — Progress Notes (Signed)
Post Partum Day 12 Subjective: No headache.  Objective: Blood pressure 111/71, pulse 72, temperature 98.3 F (36.8 C), temperature source Oral, resp. rate 17, height 5' (1.524 m), weight 149 lb 12.8 oz (67.949 kg), last menstrual period 09/16/2013, SpO2 100 %, currently breastfeeding.  Physical Exam:  General: alert and no distress Lochia: appropriate Uterine Fundus: firm Incision: C, D, I.  Nontender. DVT Evaluation: No evidence of DVT seen on physical exam.   Recent Labs  04/28/14 1135 04/29/14 1620  HGB 11.9* 12.5  HCT 35.2* 36.1    Assessment/Plan: Postpartum non proteinuric preeclampsia.  Stable.  Continue magnesium sulfate / Labetalol.  Poor glucose control.  Diabetic nurse consultation requested for glucose management.   LOS: 1 day   Annette Liotta A 04/30/2014, 7:27 AM

## 2014-05-01 LAB — GLUCOSE, CAPILLARY
GLUCOSE-CAPILLARY: 102 mg/dL — AB (ref 70–99)
GLUCOSE-CAPILLARY: 82 mg/dL (ref 70–99)
GLUCOSE-CAPILLARY: 78 mg/dL (ref 70–99)
Glucose-Capillary: 81 mg/dL (ref 70–99)
Glucose-Capillary: 59 mg/dL — ABNORMAL LOW (ref 70–99)
Glucose-Capillary: 69 mg/dL — ABNORMAL LOW (ref 70–99)
Glucose-Capillary: 85 mg/dL (ref 70–99)
Glucose-Capillary: 112 mg/dL — ABNORMAL HIGH (ref 70–99)

## 2014-05-01 NOTE — Progress Notes (Signed)
Hypoglycemic Event  CBG: 59 at 1244  Treatment: 15 GM carbohydrate snack  Symptoms: Shaky  Follow-up CBG: Time: 1304 = 69 and recheck at 1331 = 85   CBG Result:  1304 = 69 and recheck at 1331 = 85   Possible Reasons for Event: Glyburide dose decreased this morning.  Patient ate 90% breakfast.   Comments/MD notified:  45 MD in procedure.  Will call back.   [place] Dr. Jodi Mourning called back.  Notified of blood sugars.  Gave order to notify diabetes coordinator for evaluation.    [name]  Remember to initiate Hypoglycemia Order Set & complete

## 2014-05-01 NOTE — Progress Notes (Signed)
Post Partum Day 13 Subjective: no complaints  Objective: Blood pressure 136/88, pulse 70, temperature 98.7 F (37.1 C), temperature source Oral, resp. rate 18, height 5' (1.524 m), weight 147 lb 3.2 oz (66.769 kg), last menstrual period 09/16/2013, SpO2 98 %, currently breastfeeding.  Physical Exam:  General: alert and no distress Lochia: appropriate Uterine Fundus: firm Incision: none DVT Evaluation: No evidence of DVT seen on physical exam.   Recent Labs  04/28/14 1135 04/29/14 1620  HGB 11.9* 12.5  HCT 35.2* 36.1    Assessment/Plan: Postpartum preeclampsia.  Improved.  D/C magnesium sulfate.  Transfer to floor. Plan for discharge tomorrow   LOS: 2 days   Kerigan Narvaez A 05/01/2014, 6:30 AM

## 2014-05-01 NOTE — Progress Notes (Signed)
Inpatient Diabetes Program Recommendations  AACE/ADA: New Consensus Statement on Inpatient Glycemic Control (2013)  Target Ranges:  Prepandial:   less than 140 mg/dL      Peak postprandial:   less than 180 mg/dL (1-2 hours)      Critically ill patients:  140 - 180 mg/dL    Results for KEIRAH, KONITZER (MRN [id number]) as of 05/01/2014 15:31  Ref. Range 05/01/2014 07:34 05/01/2014 12:44 05/01/2014 13:04 05/01/2014 13:31  Glucose-Capillary Latest Range: 70-99 mg/dL 81 59 (L) 69 (L) 85    Reason for Note: Consult for Hypoglycemia  Outpatient Diabetes medications: Glyburide 1.25 mg with breakfast; if CBGs running high would take Glyburide 1.25 mg with supper  Current DM orders: Glyburide 1.25 mg with breakfast    **Called by RN caring for patient.  Patient with Hypoglycemia at 12 noon today (CBG 59 mg/dl) and patient symptomatic after receiving 1.25 mg Glyburide this AM.  **RN asking for advice on what to do for this patient.  MD called by RN and MD asked DM Coordinator to evaluate patient's glucose control.  **Upon review of glucose levels, note patient was Hypoglycemic yesterday AM as well.  Note that Glyburide was reduced to 1.25 mg daily today as a result, however, patient continues to have hypoglycemia.    MD- Recommend the following for improvement of glucose control:  1. Discontinue Glyburide 1.25 mg daily  2. Change current diet orders to Carbohydrate Modified diet  3. If patient has any additional glucose elevations, we can always start Novolog Sensitive correction scale (SSI)     Will follow Wyn Quaker RN, MSN, CDE Diabetes Coordinator Inpatient Diabetes Program Team Pager: [phone] (8a-10p)

## 2014-05-02 LAB — GLUCOSE, CAPILLARY
GLUCOSE-CAPILLARY: 91 mg/dL (ref 70–99)
GLUCOSE-CAPILLARY: 90 mg/dL (ref 70–99)
GLUCOSE-CAPILLARY: 87 mg/dL (ref 70–99)
Glucose-Capillary: 125 mg/dL — ABNORMAL HIGH (ref 70–99)
Glucose-Capillary: 87 mg/dL (ref 70–99)
Glucose-Capillary: 122 mg/dL — ABNORMAL HIGH (ref 70–99)

## 2014-05-02 MED ORDER — AMLODIPINE BESYLATE 5 MG PO TABS
5.0000 mg | ORAL_TABLET | Freq: Every day | ORAL | Status: DC
  Administered 2014-05-02 – 2014-05-03 (×2): 5 mg via ORAL
  Filled 2014-05-02 (×2): qty 1

## 2014-05-02 NOTE — Progress Notes (Signed)
Pt was advised by myself and nursing assistant director to place baby in bassinet for his safety when she is drowsy or is not holding him. Pt was provided with safety fall plan which she says she had on prior admission. This was reviewed with particular care given to baby safety and encouraged pt to place baby in bassinet. Baby was swaddled with 1 blanket and placed in bassinet at this time. Baby is sleeping well. [name]

## 2014-05-02 NOTE — Progress Notes (Signed)
Post Partum Day 14 Subjective: no complaints  Objective: Blood pressure 159/96, pulse 71, temperature 98.6 F (37 C), temperature source Oral, resp. rate 16, height 5' (1.524 m), weight 147 lb 4 oz (66.792 kg), last menstrual period 09/16/2013, SpO2 99 %, currently breastfeeding.  Physical Exam:  General: alert and no distress Lochia: appropriate Uterine Fundus: firm Incision: healing well DVT Evaluation: No evidence of DVT seen on physical exam.   Recent Labs  04/29/14 1620  HGB 12.5  HCT 36.1    Assessment/Plan: Postpartum PIH.  BP not optimally controlled.  Will add Norvasc.   LOS: 3 days   HARPER,CHARLES A 05/02/2014, 7:22 AM

## 2014-05-03 LAB — GLUCOSE, CAPILLARY: GLUCOSE-CAPILLARY: 123 mg/dL — AB (ref 70–99)

## 2014-05-03 MED ORDER — AMLODIPINE BESYLATE 5 MG PO TABS: 5.0000 mg | ORAL_TABLET | Freq: Every day | ORAL | Status: DC

## 2014-05-03 MED ORDER — LABETALOL HCL 200 MG PO TABS
200.0000 mg | ORAL_TABLET | Freq: Two times a day (BID) | ORAL | Status: DC
Start: 2014-05-03 — End: 2014-05-08

## 2014-05-03 NOTE — Progress Notes (Signed)
Post Partum Day 15 Subjective: no complaints  Objective: Blood pressure 128/93, pulse 74, temperature 98.2 F (36.8 C), temperature source Oral, resp. rate 18, height 5' (1.524 m), weight 146 lb 8 oz (66.452 kg), last menstrual period 09/16/2013, SpO2 100 %, currently breastfeeding.  Physical Exam:  General: alert and no distress Lochia: appropriate Uterine Fundus: firm Incision: none DVT Evaluation: No evidence of DVT seen on physical exam.  No results for input(s): HGB, HCT in the last 72 hours.  Assessment/Plan: Discharge home   LOS: 4 days   Neri Vieyra A 05/03/2014, 8:33 AM

## 2014-05-03 NOTE — Discharge Summary (Signed)
Physician Discharge Summary  Patient ID: [name] MRN: [id number] DOB/AGE: [birth year] 30 y.o.  Admit date: 04/29/2014 Discharge date: 05/03/2014  Admission Diagnoses:  Postpartum Preeclampsia                                          Poorly Controlled Diabetes  Discharge Diagnoses: Same Active Problems:   Pre-eclampsia   Discharged Condition: good  Hospital Course: Admitted with elevated BP.  Responded well to therapy.  Discharged home in good condition.   Consults: Diabetic Nurse Specialist  Significant Diagnostic Studies: labs: CBC, CMET  Treatments: IV hydration and magnesium sulfate, Glyburide, Norvasc, Labetalol  Discharge Exam: Blood pressure 128/93, pulse 74, temperature 98.2 F (36.8 C), temperature source Oral, resp. rate 18, height 5' (1.524 m), weight 146 lb 8 oz (66.452 kg), last menstrual period 09/16/2013, SpO2 100 %, currently breastfeeding. General appearance: alert and no distress Resp: clear to auscultation bilaterally Cardio: regular rate and rhythm, S1, S2 normal, no murmur, click, rub or gallop GI: normal findings: soft, non-tender Extremities: extremities normal, atraumatic, no cyanosis or edema  Disposition: 01-Home or Self Care  Discharge Instructions    Diet - low sodium heart healthy    Complete by:  As directed             Medication List    STOP taking these medications        acetaminophen-codeine 300-30 MG per tablet  Commonly known as:  TYLENOL #3      TAKE these medications        FUSION PLUS Caps  Take 1 capsule by mouth daily before breakfast.     glyBURIDE 1.25 MG tablet  Commonly known as:  DIABETA  1.25 mg 30 minutes before breakfast, 1.25 mg 30 minutes before lunch and 2.5 mg at bedtime.     OB COMPLETE PETITE 35-5-1-200 MG Caps  Take 1 capsule by mouth daily.     oxyCODONE-acetaminophen 5-325 MG per tablet  Commonly known as:  PERCOCET/ROXICET  Take 1-2 tablets by mouth every 4 (four) hours as needed for  moderate pain or severe pain (for pain scale equal to or greater than 7).     PROAIR HFA 108 (90 BASE) MCG/ACT inhaler  Generic drug:  albuterol  Inhale 2 puffs into the lungs every 6 (six) hours as needed for wheezing or shortness of breath.           Follow-up Information    Follow up with HARPER,CHARLES A, MD In 2 weeks.   Specialty:  Obstetrics and Gynecology   Contact information:   [address] [place] [address] [phone]       Signed: Shelly Bombard 05/03/2014, 8:38 AM

## 2014-05-03 NOTE — Progress Notes (Signed)
Discharge teaching complete. Pt understood all instructions and did not have any questions. Pt ambulated out of the hospital and discharged home to family.

## 2014-05-08 ENCOUNTER — Ambulatory Visit (INDEPENDENT_AMBULATORY_CARE_PROVIDER_SITE_OTHER): Payer: 59 | Admitting: Obstetrics

## 2014-05-08 ENCOUNTER — Encounter: Payer: Self-pay | Admitting: Obstetrics

## 2014-05-08 DIAGNOSIS — O24410 Gestational diabetes mellitus in pregnancy, diet controlled: Secondary | ICD-10-CM

## 2014-05-08 DIAGNOSIS — R52 Pain, unspecified: Secondary | ICD-10-CM

## 2014-05-08 DIAGNOSIS — O139 Gestational [pregnancy-induced] hypertension without significant proteinuria, unspecified trimester: Secondary | ICD-10-CM

## 2014-05-08 MED ORDER — AMLODIPINE BESYLATE 5 MG PO TABS: 5.0000 mg | ORAL_TABLET | Freq: Every day | ORAL | Status: DC

## 2014-05-08 MED ORDER — OXYCODONE-ACETAMINOPHEN 5-325 MG PO TABS: 1.0000 | ORAL_TABLET | ORAL | Status: DC | PRN

## 2014-05-08 MED ORDER — LABETALOL HCL 200 MG PO TABS: 200.0000 mg | ORAL_TABLET | Freq: Two times a day (BID) | ORAL | Status: DC

## 2014-05-08 NOTE — Progress Notes (Signed)
Subjective:     [name] is a 29 y.o. female who presents for a postpartum visit. She is 3 weeks postpartum following a low cervical transverse Cesarean section. I have fully reviewed the prenatal and intrapartum course. The delivery was at 7 gestational weeks. Outcome: repeat cesarean section, low transverse incision. Anesthesia: spinal. Postpartum course has been normal. Baby's course has been normal. Baby is feeding by bottle - Enfamil with Iron. Bleeding thin lochia. Bowel function is normal. Bladder function is normal. Patient is not sexually active. Contraception method is abstinence. Postpartum depression screening: negative.  Tobacco, alcohol and substance abuse history reviewed.  Adult immunizations reviewed including TDAP, rubella and varicella.  The following portions of the patient's history were reviewed and updated as appropriate: allergies, current medications, past family history, past medical history, past social history, past surgical history and problem list.  Review of Systems A comprehensive review of systems was negative.   Objective:    BP 131/90 mmHg  Pulse 71  Temp(Src) 98.7 F (37.1 C)  Ht 5' (1.524 m)  Wt 146 lb (66.225 kg)  BMI 28.51 kg/m2  LMP 09/16/2013  Breastfeeding? Yes     Assessment:    Postpartum, 3 weeks.  Doing well.  Contraception counseling.  Plan:    1. Contraception: Considering options 2. Continue PNV's 3. Follow up in: 4 weeks or as needed.   Healthy lifestyle practices reviewed

## 2014-05-09 ENCOUNTER — Encounter: Payer: Self-pay | Admitting: Obstetrics

## 2014-05-21 ENCOUNTER — Telehealth: Payer: Self-pay | Admitting: *Deleted

## 2014-05-21 DIAGNOSIS — O99345 Other mental disorders complicating the puerperium: Principal | ICD-10-CM

## 2014-05-21 DIAGNOSIS — F53 Postpartum depression: Secondary | ICD-10-CM

## 2014-05-21 MED ORDER — SERTRALINE HCL 50 MG PO TABS: 50.0000 mg | ORAL_TABLET | Freq: Every day | ORAL | Status: DC

## 2014-05-21 NOTE — Telephone Encounter (Signed)
Patient called and state she is really struggling and thinks she needs medication for her depression. 10:32 Spoke with patient - she states she is struggling with depression and would like to start on a medication as discussed with Dr Jodi Mourning. Offered counseling with [facility] and at first she was hesitant- but when she found out she could come her- she wants an appointment. Appointment scheduled. Zoloft sent to pharmacy per Dr Jodi Mourning verbal.

## 2014-05-29 ENCOUNTER — Ambulatory Visit: Payer: 59 | Admitting: Obstetrics

## 2014-06-23 ENCOUNTER — Encounter: Payer: Self-pay | Admitting: Obstetrics

## 2014-06-23 ENCOUNTER — Ambulatory Visit (INDEPENDENT_AMBULATORY_CARE_PROVIDER_SITE_OTHER): Payer: 59 | Admitting: Obstetrics

## 2014-06-23 DIAGNOSIS — G44009 Cluster headache syndrome, unspecified, not intractable: Secondary | ICD-10-CM

## 2014-06-23 DIAGNOSIS — O139 Gestational [pregnancy-induced] hypertension without significant proteinuria, unspecified trimester: Secondary | ICD-10-CM

## 2014-06-23 DIAGNOSIS — F53 Postpartum depression: Secondary | ICD-10-CM

## 2014-06-23 DIAGNOSIS — O99345 Other mental disorders complicating the puerperium: Secondary | ICD-10-CM

## 2014-06-23 MED ORDER — CARVEDILOL 12.5 MG PO TABS: 12.5000 mg | ORAL_TABLET | Freq: Two times a day (BID) | ORAL | Status: DC

## 2014-06-23 MED ORDER — OXYCODONE HCL 10 MG PO TABS: 10.0000 mg | ORAL_TABLET | Freq: Four times a day (QID) | ORAL | Status: DC | PRN

## 2014-06-23 MED ORDER — AMLODIPINE BESYLATE 10 MG PO TABS: 10.0000 mg | ORAL_TABLET | Freq: Every day | ORAL | Status: DC

## 2014-06-23 NOTE — Progress Notes (Signed)
Subjective:     [name] is a 29 y.o. female who presents for a postpartum visit. She is 8 weeks postpartum following a low cervical transverse Cesarean section. I have fully reviewed the prenatal and intrapartum course. The delivery was at 71 gestational weeks. Outcome: repeat cesarean section, low transverse incision. Anesthesia: spinal. Postpartum course has been complicated by Hypertension. Baby's course has been normal. Baby is feeding by breast. Bleeding no bleeding. Bowel function is normal. Bladder function is normal. Patient is sexually active. Contraception method is condoms. Postpartum depression screening: negative.  Tobacco, alcohol and substance abuse history reviewed.  Adult immunizations reviewed including TDAP, rubella and varicella.  The following portions of the patient's history were reviewed and updated as appropriate: allergies, current medications, past family history, past medical history, past social history, past surgical history and problem list.  Review of Systems Positive for HA's and Depression.  Objective:    BP 138/100 mmHg  Pulse 84  Temp(Src) 97.9 F (36.6 C)  Ht 5' (1.524 m)  Wt 154 lb (69.854 kg)  BMI 30.08 kg/m2  Breastfeeding? Yes  General:  alert and no distress   Breasts:  inspection negative, no nipple discharge or bleeding, no masses or nodularity palpable  Lungs: clear to auscultation bilaterally  Heart:  regular rate and rhythm, S1, S2 normal, no murmur, click, rub or gallop  Abdomen: normal findings: soft, non-tender.  Incision C, D, I.   Vulva:  normal  Vagina: normal vagina  Cervix:  no cervical motion tenderness  Corpus: normal size, contour, position, consistency, mobility, non-tender  Adnexa:  no mass, fullness, tenderness  Rectal Exam: Not performed.          Assessment:     Normal postpartum exam. Pap smear not done at today's visit.   Hypertension - transient, postpartum.  Mild. Contraceptive management.  Wants  Nexplanon. Postpartum Depression - stable  Plan:    1. Contraception: Nexplanon 2. Nexplanon Rx 3. D/C Labetalol.  Start Coreg and increase Norvasc 4. Continue Zoloft 5. Follow up in: 2 weeks or as needed.   Healthy lifestyle practices reviewed

## 2014-07-11 ENCOUNTER — Telehealth: Payer: Self-pay | Admitting: *Deleted

## 2014-07-11 NOTE — Telephone Encounter (Signed)
Patient states she has just stopped bleeding after her delivery. Patient state she has not resumed intercourse since delivery. Patient is interested in a Mirena IUD for contraception. Patient has been scheduled for an appointment on 07-17-14.

## 2014-07-17 ENCOUNTER — Encounter: Payer: Self-pay | Admitting: Certified Nurse Midwife

## 2014-07-17 ENCOUNTER — Ambulatory Visit (INDEPENDENT_AMBULATORY_CARE_PROVIDER_SITE_OTHER): Payer: Medicaid Other | Admitting: Certified Nurse Midwife

## 2014-07-17 VITALS — BP 141/101 | HR 76 | Temp 98.2°F | Ht 60.0 in | Wt 159.0 lb

## 2014-07-17 DIAGNOSIS — A630 Anogenital (venereal) warts: Secondary | ICD-10-CM

## 2014-07-17 DIAGNOSIS — IMO0001 Reserved for inherently not codable concepts without codable children: Secondary | ICD-10-CM

## 2014-07-17 DIAGNOSIS — Z01812 Encounter for preprocedural laboratory examination: Secondary | ICD-10-CM

## 2014-07-17 DIAGNOSIS — Z30430 Encounter for insertion of intrauterine contraceptive device: Secondary | ICD-10-CM | POA: Diagnosis not present

## 2014-07-17 DIAGNOSIS — R030 Elevated blood-pressure reading, without diagnosis of hypertension: Secondary | ICD-10-CM

## 2014-07-17 MED ORDER — SINECATECHINS 15 % EX OINT: 1.0000 "application " | TOPICAL_OINTMENT | Freq: Three times a day (TID) | CUTANEOUS | Status: DC

## 2014-07-17 MED ORDER — PROPRANOLOL-HCTZ 80-25 MG PO TABS: 1.0000 | ORAL_TABLET | Freq: Every day | ORAL | Status: DC

## 2014-07-17 NOTE — Progress Notes (Signed)
Patient ID: EVALISSE PRAJAPATI, female   DOB: 09-21-1985, 29 y.o.   MRN: [id number]  Also is having HTN with an elevated B/P at today's visit.  States the depression is improving.  Lost her job during her pregnancy.  Has two small children at home and is overwhelmed at times, does not get a lot of sleep.  She also desires treatment for her genital warts.      IUD Procedure Note   DIAGNOSIS: Desires long-term, reversible contraception.  Has a hx of PCOS.     PROCEDURE: IUD placement Performing Provider: Kandis Cocking, CNM  Patient counseled prior to procedure. I explained risks and benefits of Mirena IUD, reviewed alternative forms of contraception. Patient stated understanding and consented to continue with procedure.   LMP: roughly 07/12/14 Pregnancy Test: Negative Lot #: [id number] Expiration Date: 11/18   IUD type: [X]  Mirena   [ ]  Paraguard    PROCEDURE:  Timeout procedure was performed to ensure right patient and right site.  A bimanual exam was performed to determine the position of the uterus, retroverted. The speculum was placed. The vagina and cervix was sterilized in the usual manner and sterile technique was maintained throughout the course of the procedure. The depth of the uterus was sounded to 9 cm. A tenaculum was not required. The IUD was inserted to the appropriate depth and inserted without difficulty.  The string was cut to an estimated 4 cm length. Bleeding was minimal. The patient tolerated the procedure well.   Follow up: The patient tolerated the procedure well without complications.  Standard post-procedure care is explained and return precautions are given.  F/U appointment in 1 month.  Consults for cardiology and a PCP provider were given.    One medication added for HTN.    Kandis Cocking CNM

## 2014-07-17 NOTE — Progress Notes (Addendum)
Patient ID: [name], female   DOB: 04-28-1985, 29 y.o.   MRN: [id number]   Chief Complaint  Patient presents with  . Procedure    Mirena IUD Insertion     HPI [name] is a 29 y.o. female.  C/O elevated blood pressures with HA.  Is here for Mirena IUD insertion, while here addressed her needs for PCP provider, cardiology referral and genital wart treatment.     HPI  Past Medical History  Diagnosis Date  . Pelvic inflammatory disease   . Pancreatitis     diet controlled  . Polycystic ovary syndrome   . Pancreatic cyst   . Asthma     rarely uses inhaler  . Missed ab     x 2, one requiring D & E  . 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 Bronchitis   . Bipolar disorder     HX PPD after 2014 delivery, no meds  . Anemia   . Hypertension     Past Surgical History  Procedure Laterality Date  . Splenectomy, total  2012  . Pancreas surgery  2012    s/p partial pancreatectomy  . Cesarean section N/A 05/23/2012    Procedure: CESAREAN SECTION;  Surgeon: Lahoma Crocker, MD;  Location: [facility];  Service: Obstetrics;  Laterality: N/A;  primary  . Dilation and curettage of uterus       for MAB  . Wisdom tooth extraction    . Cesarean section N/A 04/18/2014    Procedure: REPEAT CESAREAN SECTION;  Surgeon: Shelly Bombard, MD;  Location: [facility];  Service: Obstetrics;  Laterality: N/A;    Family History  Problem Relation Age of Onset  . Diabetes Mother   . Colon cancer Neg Hx     Social History History  Substance Use Topics  . Smoking status: Current Every Day Smoker -- 0.50 packs/day for 13 years    Types: Cigarettes  . Smokeless tobacco: Never Used  . Alcohol Use: 0.0 oz/week    0 Standard drinks or equivalent per week     Comment: social     Allergies  Allergen Reactions  . Dilaudid [Hydromorphone Hcl] Itching and Other (See Comments)    hallucinations "Felt like she was losing her mind"     Current Outpatient Prescriptions  Medication Sig Dispense Refill  . albuterol (PROAIR HFA) 108 (90 BASE) MCG/ACT inhaler Inhale 2 puffs into the lungs every 6 (six) hours as needed for wheezing or shortness of breath.     Marland Kitchen amLODipine (NORVASC) 10 MG tablet Take 1 tablet (10 mg total) by mouth daily. 30 tablet 5  . carvedilol (COREG) 12.5 MG tablet Take 1 tablet (12.5 mg total) by mouth 2 (two) times daily with a meal. 60 tablet 5  . Iron-FA-B Cmp-C-Biot-Probiotic (FUSION PLUS) CAPS Take 1 capsule by mouth daily before breakfast. 30 capsule 5  . Oxycodone HCl 10 MG TABS Take 1 tablet (10 mg total) by mouth every 6 (six) hours as needed. 40 tablet 0  . Prenat-FeCbn-FeAspGl-FA-Omega (OB COMPLETE PETITE) 35-5-1-200 MG CAPS Take 1 capsule by mouth daily. 30 capsule 11  . sertraline (ZOLOFT) 50 MG tablet Take 1 tablet (50 mg total) by mouth daily. 30 tablet 5  . glyBURIDE (DIABETA) 1.25 MG tablet 1.25 mg 30 minutes before breakfast, 1.25 mg 30 minutes before lunch and 2.5 mg at bedtime. (Patient not taking: Reported on 05/08/2014) 120 tablet 2  . propranolol-hydrochlorothiazide (INDERIDE) 80-25 MG per  tablet Take 1 tablet by mouth daily. In the morning. 30 tablet 12  . Sinecatechins (VEREGEN) 15 % OINT Apply 1 application topically 3 (three) times daily. For 16 weeks 30 g 2   No current facility-administered medications for this visit.    Review of Systems Review of Systems Constitutional: negative for weight loss, + fatigue Respiratory: negative for cough and wheezing Cardiovascular: negative for chest pain and palpitations Gastrointestinal: negative for abdominal pain and change in bowel habits Genitourinary: + genital warts Integument/breast: + breastfeeding Musculoskeletal:negative for myalgias Neurological: negative for gait problems and tremors Behavioral/Psych: negative for abusive relationship, + depression on Zoloft Endocrine: negative for temperature intolerance     Blood  pressure 141/101, pulse 76, temperature 98.2 F (36.8 C), height 5' (1.524 m), weight 72.122 kg (159 lb), currently breastfeeding.  Physical Exam Physical Exam General:   alert  Skin:   no rash or abnormalities  Lungs:   clear to auscultation bilaterally  Heart:   regular rate and rhythm, S1, S2 normal, no murmur, click, rub or gallop  Breasts:   normal without suspicious masses, skin or nipple changes or axillary nodes  Abdomen:  normal findings: no organomegaly, soft, non-tender and no hernia  Pelvis:  External genitalia: normal general appearance, + condyloma on left lower labia towards rectum Urinary system: urethral meatus normal and bladder without fullness, nontender Vaginal: normal without tenderness, induration or masses Cervix: normal appearance Adnexa: normal bimanual exam Uterus: retroverted and non-tender, normal size    30% of 30 min visit spent on counseling and coordination of care.   Data Reviewed Previous medical hx, labs, medications  Assessment     Condyloma LARK placed/Mirena HTN, uncontrolled Depression: situational combined with hx of bipolar     Plan   Pap Smear obtained.   Orders Placed This Encounter  Procedures  . Ambulatory referral to Cardiology    Referral Priority:  Routine    Referral Type:  Consultation    Referral Reason:  Specialty Services Required    Requested Specialty:  Cardiology    Number of Visits Requested:  1  . Ambulatory referral to Internal Medicine    Referral Priority:  Routine    Referral Type:  Consultation    Referral Reason:  Specialty Services Required    Requested Specialty:  Internal Medicine    Number of Visits Requested:  1  . POCT urine pregnancy   Meds ordered this encounter  Medications  . propranolol-hydrochlorothiazide (INDERIDE) 80-25 MG per tablet    Sig: Take 1 tablet by mouth daily. In the morning.    Dispense:  30 tablet    Refill:  12  . Sinecatechins (VEREGEN) 15 % OINT    Sig: Apply 1  application topically 3 (three) times daily. For 16 weeks    Dispense:  30 g    Refill:  2    Follow up 1 month

## 2014-07-17 NOTE — Addendum Note (Signed)
Addended by: Bettye Boeck on: 07/17/2014 02:44 PM   Modules accepted: Orders

## 2014-07-18 ENCOUNTER — Inpatient Hospital Stay (HOSPITAL_COMMUNITY): Payer: Medicaid Other

## 2014-07-18 ENCOUNTER — Encounter (HOSPITAL_COMMUNITY): Payer: Self-pay | Admitting: *Deleted

## 2014-07-18 ENCOUNTER — Inpatient Hospital Stay (HOSPITAL_COMMUNITY)
Admission: AD | Admit: 2014-07-18 | Discharge: 2014-07-18 | Disposition: A | Discharge status: Home / Self Care | Payer: Medicaid Other | Source: Ambulatory Visit | Attending: Emergency Medicine | Admitting: Emergency Medicine

## 2014-07-18 DIAGNOSIS — Z79899 Other long term (current) drug therapy: Secondary | ICD-10-CM | POA: Diagnosis not present

## 2014-07-18 DIAGNOSIS — F419 Anxiety disorder, unspecified: Secondary | ICD-10-CM | POA: Diagnosis not present

## 2014-07-18 DIAGNOSIS — Z8719 Personal history of other diseases of the digestive system: Secondary | ICD-10-CM | POA: Insufficient documentation

## 2014-07-18 DIAGNOSIS — E119 Type 2 diabetes mellitus without complications: Secondary | ICD-10-CM | POA: Insufficient documentation

## 2014-07-18 DIAGNOSIS — I10 Essential (primary) hypertension: Secondary | ICD-10-CM | POA: Diagnosis not present

## 2014-07-18 DIAGNOSIS — G44209 Tension-type headache, unspecified, not intractable: Secondary | ICD-10-CM

## 2014-07-18 DIAGNOSIS — D649 Anemia, unspecified: Secondary | ICD-10-CM | POA: Insufficient documentation

## 2014-07-18 DIAGNOSIS — Z720 Tobacco use: Secondary | ICD-10-CM | POA: Diagnosis not present

## 2014-07-18 DIAGNOSIS — R51 Headache: Secondary | ICD-10-CM | POA: Diagnosis not present

## 2014-07-18 DIAGNOSIS — R519 Headache, unspecified: Secondary | ICD-10-CM

## 2014-07-18 DIAGNOSIS — Z8742 Personal history of other diseases of the female genital tract: Secondary | ICD-10-CM | POA: Insufficient documentation

## 2014-07-18 DIAGNOSIS — J45909 Unspecified asthma, uncomplicated: Secondary | ICD-10-CM | POA: Diagnosis not present

## 2014-07-18 LAB — CBC WITH DIFFERENTIAL/PLATELET
BASOS ABS: 0.1 10*3/uL (ref 0.0–0.1)
BASOS PCT: 1 % (ref 0–1)
Eosinophils Absolute: 0.1 10*3/uL (ref 0.0–0.7)
Eosinophils Relative: 2 % (ref 0–5)
HCT: 36.1 % (ref 36.0–46.0)
Hemoglobin: 12.2 g/dL (ref 12.0–15.0)
LYMPHS ABS: 2.3 10*3/uL (ref 0.7–4.0)
LYMPHS PCT: 44 % (ref 12–46)
MCH: 22.8 pg — AB (ref 26.0–34.0)
MCHC: 33.8 g/dL (ref 30.0–36.0)
MCV: 67.6 fL — ABNORMAL LOW (ref 78.0–100.0)
Monocytes Absolute: 0.5 10*3/uL (ref 0.1–1.0)
Monocytes Relative: 9 % (ref 3–12)
Neutro Abs: 2.2 10*3/uL (ref 1.7–7.7)
Neutrophils Relative %: 44 % (ref 43–77)
Platelets: 514 10*3/uL — ABNORMAL HIGH (ref 150–400)
RBC: 5.34 MIL/uL — ABNORMAL HIGH (ref 3.87–5.11)
RDW: 17.5 % — AB (ref 11.5–15.5)
WBC: 5.2 10*3/uL (ref 4.0–10.5)

## 2014-07-18 LAB — COMPREHENSIVE METABOLIC PANEL
ALT: 15 U/L (ref 14–54)
AST: 17 U/L (ref 15–41)
Albumin: 3.9 g/dL (ref 3.5–5.0)
Alkaline Phosphatase: 71 U/L (ref 38–126)
Anion gap: 10 (ref 5–15)
BUN: 8 mg/dL (ref 6–20)
CO2: 24 mmol/L (ref 22–32)
Calcium: 9.1 mg/dL (ref 8.9–10.3)
Chloride: 104 mmol/L (ref 101–111)
Creatinine, Ser: 0.55 mg/dL (ref 0.44–1.00)
GFR calc Af Amer: >60 mL/min (ref >60)
GFR calc non Af Amer: >60 mL/min (ref >60)
Glucose, Bld: 112 mg/dL — ABNORMAL HIGH (ref 65–99)
Potassium: 3.8 mmol/L (ref 3.5–5.1)
Sodium: 138 mmol/L (ref 135–145)
Total Bilirubin: 0.2 mg/dL — ABNORMAL LOW (ref 0.3–1.2)
Total Protein: 7.3 g/dL (ref 6.5–8.1)

## 2014-07-18 LAB — URINALYSIS, ROUTINE W REFLEX MICROSCOPIC
Bilirubin Urine: NEGATIVE
GLUCOSE, UA: NEGATIVE mg/dL
Ketones, ur: 15 mg/dL — AB
Leukocytes, UA: NEGATIVE
Nitrite: NEGATIVE
PROTEIN: NEGATIVE mg/dL
SPECIFIC GRAVITY, URINE: 1.021 (ref 1.005–1.030)
Urobilinogen, UA: 0.2 mg/dL (ref 0.0–1.0)
pH: 6.5 (ref 5.0–8.0)

## 2014-07-18 LAB — LIPASE, BLOOD: LIPASE: 17 U/L — AB (ref 22–51)

## 2014-07-18 LAB — URINE MICROSCOPIC-ADD ON

## 2014-07-18 LAB — CBG MONITORING, ED: GLUCOSE-CAPILLARY: 125 mg/dL — AB (ref 65–99)

## 2014-07-18 MED ORDER — METOCLOPRAMIDE HCL 10 MG PO TABS: 10.0000 mg | ORAL_TABLET | Freq: Four times a day (QID) | ORAL | Status: DC | PRN

## 2014-07-18 MED ORDER — DIPHENHYDRAMINE HCL 50 MG/ML IJ SOLN
25.0000 mg | Freq: Once | INTRAMUSCULAR | Status: AC
  Administered 2014-07-18: 25 mg via INTRAVENOUS
  Filled 2014-07-18: qty 1

## 2014-07-18 MED ORDER — METFORMIN HCL 500 MG PO TABS: 500.0000 mg | ORAL_TABLET | Freq: Two times a day (BID) | ORAL | Status: DC

## 2014-07-18 MED ORDER — METOCLOPRAMIDE HCL 5 MG/ML IJ SOLN
10.0000 mg | Freq: Once | INTRAMUSCULAR | Status: AC
  Administered 2014-07-18: 10 mg via INTRAVENOUS
  Filled 2014-07-18: qty 2

## 2014-07-18 MED ORDER — BUTALBITAL-APAP-CAFFEINE 50-325-40 MG PO TABS
1.0000 | ORAL_TABLET | Freq: Once | ORAL | Status: AC
  Administered 2014-07-18: 1 via ORAL
  Filled 2014-07-18: qty 1

## 2014-07-18 MED ORDER — SODIUM CHLORIDE 0.9 % IV SOLN: 1000.0000 mL | INTRAVENOUS | Status: DC

## 2014-07-18 MED ORDER — SODIUM CHLORIDE 0.9 % IV SOLN
1000.0000 mL | Freq: Once | INTRAVENOUS | Status: AC
Start: 2014-07-18 — End: 2014-07-18
  Administered 2014-07-18: 1000 mL via INTRAVENOUS

## 2014-07-18 NOTE — MAU Note (Addendum)
Had C/S 04/18/14. Had high blood pressure after delivery and readmitted. Has been taking Coreg and Amlodipine in am and Coreg at hs. Known diabetic and having trouble with high sugars. 165 before coming in. Taking Glyburide with pregnancy but had to stop afterward due to low sugars. Has been controlling sugar with diet. Dr Jodi Mourning placed IUD yesterday. Dr Jodi Mourning manages pt's B/P. Has h/a

## 2014-07-18 NOTE — MAU Note (Signed)
EMS arrival @ 2020 EMS departure @ 2033  Freddi Che, RN transport

## 2014-07-18 NOTE — ED Provider Notes (Signed)
CSN: [id number]     Arrival date & time 07/18/14  1916 History   First MD Initiated Contact with Patient 07/18/14 2100     Chief Complaint  Patient presents with  . Hypertension  . Hyperglycemia     (Consider location/radiation/quality/duration/timing/severity/associated sxs/prior Treatment) Patient is a 29 y.o. female presenting with hypertension and hyperglycemia. The history is provided by the patient.  Hypertension  Hyperglycemia She was referred to the ED from [facility] each ear where she presented with headache and was noted to be hypertensive. She has had problems with hypertension and headaches since delivering a baby 3 months ago. She was in patient with suspected pregnancy-induced hypertension and treated with intravenous magnesium but blood pressure has continued to be elevated and headaches have persisted. Headaches are bifrontal and described as a throbbing pain which he rates at 10/10. Headaches are worse with exposure to light. There is no visual change, nausea, vomiting. There is no phonophobia. She is tried over-the-counter medications with no relief of her headache. Oxycodone-acetaminophen does give her relief of her headache. Of note, she is breast-feeding. 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 She states she has been compliant with her blood pressure and diabetic medications. She has noted some weight gain since giving birth and this is unusual for her.  Past Medical History  Diagnosis Date  . Pelvic inflammatory disease   . Pancreatitis     diet controlled  . Polycystic ovary syndrome   . Pancreatic cyst   . Asthma     rarely uses inhaler  . Missed ab     x 2, one requiring D & E  . 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 Bronchitis   . Bipolar disorder     HX PPD after 2014 delivery, no meds  . Anemia   . Hypertension     Past Surgical History  Procedure Laterality Date  . Splenectomy, total  2012  . Pancreas surgery  2012    s/p partial pancreatectomy  . Cesarean section N/A 05/23/2012    Procedure: CESAREAN SECTION;  Surgeon: Lahoma Crocker, MD;  Location: [facility];  Service: Obstetrics;  Laterality: N/A;  primary  . Dilation and curettage of uterus       for MAB  . Wisdom tooth extraction    . Cesarean section N/A 04/18/2014    Procedure: REPEAT CESAREAN SECTION;  Surgeon: Shelly Bombard, MD;  Location: [facility];  Service: Obstetrics;  Laterality: N/A;   Family History  Problem Relation Age of Onset  . Diabetes Mother   . Colon cancer Neg Hx    History  Substance Use Topics  . Smoking status: Current Every Day Smoker -- 0.50 packs/day for 13 years    Types: Cigarettes  . Smokeless tobacco: Never Used  . Alcohol Use: 0.0 oz/week    0 Standard drinks or equivalent per week     Comment: social    OB History    Gravida Para Term Preterm AB TAB SAB Ectopic Multiple Living   5 2 2  3 1 2   0 2     Review of Systems  All other systems reviewed and are negative.     Allergies  Dilaudid  Home Medications   Prior to Admission medications   Medication Sig Start Date End Date Taking? Authorizing Provider  amLODipine (NORVASC) 10 MG tablet Take 1  tablet (10 mg total) by mouth daily. 06/23/14  Yes Shelly Bombard, MD  carvedilol (COREG) 12.5 MG tablet Take 1 tablet (12.5 mg total) by mouth 2 (two) times daily with a meal. 06/23/14  Yes Shelly Bombard, MD  Iron-FA-B Cmp-C-Biot-Probiotic (FUSION PLUS) CAPS Take 1 capsule by mouth daily before breakfast. 04/21/14  Yes Shelly Bombard, MD  Oxycodone HCl 10 MG TABS Take 1 tablet (10 mg total) by mouth every 6 (six) hours as needed. 06/23/14  Yes Shelly Bombard, MD  Prenat-FeCbn-FeAspGl-FA-Omega (OB COMPLETE PETITE) 35-5-1-200 MG CAPS Take 1 capsule by mouth daily. 02/05/14  Yes Lahoma Crocker, MD  sertraline (ZOLOFT) 50 MG tablet Take 1  tablet (50 mg total) by mouth daily. 05/21/14  Yes Shelly Bombard, MD  albuterol (PROAIR HFA) 108 (90 BASE) MCG/ACT inhaler Inhale 2 puffs into the lungs every 6 (six) hours as needed for wheezing or shortness of breath.     Historical Provider, MD  glyBURIDE (DIABETA) 1.25 MG tablet 1.25 mg 30 minutes before breakfast, 1.25 mg 30 minutes before lunch and 2.5 mg at bedtime. Patient not taking: Reported on 05/08/2014 02/19/14   Lahoma Crocker, MD  propranolol-hydrochlorothiazide (INDERIDE) 80-25 MG per tablet Take 1 tablet by mouth daily. In the morning. 07/17/14   Rachelle A Denney, CNM  Sinecatechins (VEREGEN) 15 % OINT Apply 1 application topically 3 (three) times daily. For 16 weeks 07/17/14   Rachelle A Denney, CNM   BP 149/94 mmHg  Pulse 63  Temp(Src) 98.1 F (36.7 C) (Oral)  Resp 12  Ht 5' (1.524 m)  Wt 156 lb 12.8 oz (71.124 kg)  BMI 30.62 kg/m2  SpO2 100% Physical Exam  Nursing note and vitals reviewed.  29 year old female, resting comfortably and in no acute distress. Vital signs are significant for hypertension with blood pressure 149/94. Oxygen saturation is 100%, which is normal. Head is normocephalic and atraumatic. PERRLA, EOMI. Oropharynx is clear. Fundi show no hemorrhage, exudate, or papilledema. There is tenderness palpation over the temporalis muscles which does reproduce her headache. There is no tenderness to palpation over the insertion of the paracervical muscles. Neck is nontender and supple without adenopathy or JVD. Back is nontender and there is no CVA tenderness. Lungs are clear without rales, wheezes, or rhonchi. Chest is nontender. Heart has regular rate and rhythm without murmur. Abdomen is soft, flat, nontender without masses or hepatosplenomegaly and peristalsis is normoactive. Extremities have no cyanosis or edema, full range of motion is present. Skin is warm and dry without rash. Neurologic: Mental status is normal, cranial nerves are intact, there  are no motor or sensory deficits.  ED Course  Procedures (including critical care time) Labs Review Results for orders placed or performed during the hospital encounter of 07/18/14  CBC with Differential  Result Value Ref Range   WBC 5.2 4.0 - 10.5 K/uL   RBC 5.34 (H) 3.87 - 5.11 MIL/uL   Hemoglobin 12.2 12.0 - 15.0 g/dL   HCT 36.1 36.0 - 46.0 %   MCV 67.6 (L) 78.0 - 100.0 fL   MCH 22.8 (L) 26.0 - 34.0 pg   MCHC 33.8 30.0 - 36.0 g/dL   RDW 17.5 (H) 11.5 - 15.5 %   Platelets 514 (H) 150 - 400 K/uL   Neutrophils Relative % 44 43 - 77 %   Lymphocytes Relative 44 12 - 46 %   Monocytes Relative 9 3 - 12 %   Eosinophils Relative 2 0 - 5 %  Basophils Relative 1 0 - 1 %   Neutro Abs 2.2 1.7 - 7.7 K/uL   Lymphs Abs 2.3 0.7 - 4.0 K/uL   Monocytes Absolute 0.5 0.1 - 1.0 K/uL   Eosinophils Absolute 0.1 0.0 - 0.7 K/uL   Basophils Absolute 0.1 0.0 - 0.1 K/uL   RBC Morphology POLYCHROMASIA PRESENT   Comprehensive metabolic panel  Result Value Ref Range   Sodium 138 135 - 145 mmol/L   Potassium 3.8 3.5 - 5.1 mmol/L   Chloride 104 101 - 111 mmol/L   CO2 24 22 - 32 mmol/L   Glucose, Bld 112 (H) 65 - 99 mg/dL   BUN 8 6 - 20 mg/dL   Creatinine, Ser 0.55 0.44 - 1.00 mg/dL   Calcium 9.1 8.9 - 10.3 mg/dL   Total Protein 7.3 6.5 - 8.1 g/dL   Albumin 3.9 3.5 - 5.0 g/dL   AST 17 15 - 41 U/L   ALT 15 14 - 54 U/L   Alkaline Phosphatase 71 38 - 126 U/L   Total Bilirubin 0.2 (L) 0.3 - 1.2 mg/dL   GFR calc non Af Amer >60 >60 mL/min   GFR calc Af Amer >60 >60 mL/min   Anion gap 10 5 - 15  Lipase, blood  Result Value Ref Range   Lipase 17 (L) 22 - 51 U/L  Urinalysis, Routine w reflex microscopic  Result Value Ref Range   Color, Urine YELLOW YELLOW   APPearance CLOUDY (A) CLEAR   Specific Gravity, Urine 1.021 1.005 - 1.030   pH 6.5 5.0 - 8.0   Glucose, UA NEGATIVE NEGATIVE mg/dL   Hgb urine dipstick TRACE (A) NEGATIVE   Bilirubin Urine NEGATIVE NEGATIVE   Ketones, ur 15 (A) NEGATIVE mg/dL    Protein, ur NEGATIVE NEGATIVE mg/dL   Urobilinogen, UA 0.2 0.0 - 1.0 mg/dL   Nitrite NEGATIVE NEGATIVE   Leukocytes, UA NEGATIVE NEGATIVE  Urine microscopic-add on  Result Value Ref Range   Squamous Epithelial / LPF FEW (A) RARE   RBC / HPF 0-2 <3 RBC/hpf   Bacteria, UA RARE RARE  CBG monitoring, ED  Result Value Ref Range   Glucose-Capillary 125 (H) 65 - 99 mg/dL   Imaging Review Ct Head Wo Contrast  07/18/2014   CLINICAL DATA:  Headaches off and on since the birth of her son in February.  EXAM: CT HEAD WITHOUT CONTRAST  TECHNIQUE: Contiguous axial images were obtained from the base of the skull through the vertex without intravenous contrast.  COMPARISON:  11/05/2009  FINDINGS: Ventricles and sulci are symmetrical. No ventricular dilatation. No mass effect or midline shift. No abnormal extra-axial fluid collections. Gray-white matter junctions are distinct. Basal cisterns are not effaced. No evidence of acute intracranial hemorrhage. No depressed skull fractures. Visualized paranasal sinuses and mastoid air cells are not opacified. Multiple extra-axial calcifications along the falx and along the calvarium possibly representing dural calcifications. Appearance is unchanged since previous study and may the due to postinflammatory or dystrophic calcification.  IMPRESSION: No acute intracranial abnormalities.  No change since prior study.   Electronically Signed   By: Lucienne Capers M.D.   On: 07/18/2014 22:00    MDM   Final diagnoses:  Acute intractable headache, unspecified headache type  Essential hypertension  Headache, unspecified headache type  Muscle contraction headache    Headache which seems most likely to be muscle contraction headache. This will be treated with IV fluids, metoclopramide, and diphenhydramine. Hypertension which is mild. This may need adjustments of  her medications but should be able to be accomplished as an outpatient. CT scan of the head will be obtained to  rule out Sheehan syndrome. Patient is also requesting a lipase be drawn to evaluate her pancreas.  She had excellent relief of her headache with above noted treatment. CT is negative. Blood pressures come down with improvement headache. At this point, I am reluctant to make any changes in her antihypertensive regimen. She is concerned about her spikes in blood sugar. She has clubbing ride but she is not taking it on a regular basis and states she really only wants to take it when her sugar goes up. I do not feel that this is safe and she does have periods of normal blood sugar. Risk of hypoglycemia on glyburide is too great. She will be switched to metformin and she is given a prescription for metformin 500 mg twice a day. She is also given a prescription for metoclopramide and is referred to the [facility] for follow-up.  Delora Fuel, MD 28/36/62 [id number]

## 2014-07-18 NOTE — Discharge Instructions (Signed)
Tension Headache A tension headache is a feeling of pain, pressure, or aching often felt over the front and sides of the head. The pain can be dull or can feel tight (constricting). It is the most common type of headache. Tension headaches are not normally associated with nausea or vomiting and do not get worse with physical activity. Tension headaches can last 30 minutes to several days.  CAUSES  The exact cause is not known, but it may be caused by chemicals and hormones in the brain that lead to pain. Tension headaches often begin after stress, anxiety, or depression. Other triggers may include:  Alcohol.  Caffeine (too much or withdrawal).  Respiratory infections (colds, flu, sinus infections).  Dental problems or teeth clenching.  Fatigue.  Holding your head and neck in one position too long while using a computer. SYMPTOMS   Pressure around the head.   Dull, aching head pain.   Pain felt over the front and sides of the head.   Tenderness in the muscles of the head, neck, and shoulders. DIAGNOSIS  A tension headache is often diagnosed based on:   Symptoms.   Physical examination.   A CT scan or MRI of your head. These tests may be ordered if symptoms are severe or unusual. TREATMENT  Medicines may be given to help relieve symptoms.  HOME CARE INSTRUCTIONS   Only take over-the-counter or prescription medicines for pain or discomfort as directed by your caregiver.   Lie down in a dark, quiet room when you have a headache.   Keep a journal to find out what may be triggering your headaches. For example, write down:  What you eat and drink.  How much sleep you get.  Any change to your diet or medicines.  Try massage or other relaxation techniques.   Ice packs or heat applied to the head and neck can be used. Use these 3 to 4 times per day for 15 to 20 minutes each time, or as needed.   Limit stress.   Sit up straight, and do not tense your muscles.    Quit smoking if you smoke.  Limit alcohol use.  Decrease the amount of caffeine you drink, or stop drinking caffeine.  Eat and exercise regularly.  Get 7 to 9 hours of sleep, or as recommended by your caregiver.  Avoid excessive use of pain medicine as recurrent headaches can occur.  SEEK MEDICAL CARE IF:   You have problems with the medicines you were prescribed.  Your medicines do not work.  You have a change from the usual headache.  You have nausea or vomiting. SEEK IMMEDIATE MEDICAL CARE IF:   Your headache becomes severe.  You have a fever.  You have a stiff neck.  You have loss of vision.  You have muscular weakness or loss of muscle control.  You lose your balance or have trouble walking.  You feel faint or pass out.  You have severe symptoms that are different from your first symptoms. MAKE SURE YOU:   Understand these instructions.  Will watch your condition.  Will get help right away if you are not doing well or get worse. Document Released: 02/21/2005 Document Revised: 05/16/2011 Document Reviewed: 02/11/2011 [facility] Patient Information 2015 [place], [place]. This information is not intended to replace advice given to you by your health care provider. Make sure you discuss any questions you have with your health care provider.  Hypertension Hypertension, commonly called high blood pressure, is when the force of  blood pumping through your arteries is too strong. Your arteries are the blood vessels that carry blood from your heart throughout your body. A blood pressure reading consists of a higher number over a lower number, such as 110/72. The higher number (systolic) is the pressure inside your arteries when your heart pumps. The lower number (diastolic) is the pressure inside your arteries when your heart relaxes. Ideally you want your blood pressure below 120/80. Hypertension forces your heart to work harder to pump blood. Your arteries may become  narrow or stiff. Having hypertension puts you at risk for heart disease, stroke, and other problems.  RISK FACTORS Some risk factors for high blood pressure are controllable. Others are not.  Risk factors you cannot control include:   Race. You may be at higher risk if you are African American.  Age. Risk increases with age.  Gender. Men are at higher risk than women before age 57 years. After age 55, women are at higher risk than men. Risk factors you can control include:  Not getting enough exercise or physical activity.  Being overweight.  Getting too much fat, sugar, calories, or salt in your diet.  Drinking too much alcohol. SIGNS AND SYMPTOMS Hypertension does not usually cause signs or symptoms. Extremely high blood pressure (hypertensive crisis) may cause headache, anxiety, shortness of breath, and nosebleed. DIAGNOSIS  To check if you have hypertension, your health care provider will measure your blood pressure while you are seated, with your arm held at the level of your heart. It should be measured at least twice using the same arm. Certain conditions can cause a difference in blood pressure between your right and left arms. A blood pressure reading that is higher than normal on one occasion does not mean that you need treatment. If one blood pressure reading is high, ask your health care provider about having it checked again. TREATMENT  Treating high blood pressure includes making lifestyle changes and possibly taking medicine. Living a healthy lifestyle can help lower high blood pressure. You may need to change some of your habits. Lifestyle changes may include:  Following the DASH diet. This diet is high in fruits, vegetables, and whole grains. It is low in salt, red meat, and added sugars.  Getting at least 2 hours of brisk physical activity every week.  Losing weight if necessary.  Not smoking.  Limiting alcoholic beverages.  Learning ways to reduce stress. If  lifestyle changes are not enough to get your blood pressure under control, your health care provider may prescribe medicine. You may need to take more than one. Work closely with your health care provider to understand the risks and benefits. HOME CARE INSTRUCTIONS  Have your blood pressure rechecked as directed by your health care provider.   Take medicines only as directed by your health care provider. Follow the directions carefully. Blood pressure medicines must be taken as prescribed. The medicine does not work as well when you skip doses. Skipping doses also puts you at risk for problems.   Do not smoke.   Monitor your blood pressure at home as directed by your health care provider. SEEK MEDICAL CARE IF:   You think you are having a reaction to medicines taken.  You have recurrent headaches or feel dizzy.  You have swelling in your ankles.  You have trouble with your vision. SEEK IMMEDIATE MEDICAL CARE IF:  You develop a severe headache or confusion.  You have unusual weakness, numbness, or feel faint.  You  have severe chest or abdominal pain.  You vomit repeatedly.  You have trouble breathing. MAKE SURE YOU:   Understand these instructions.  Will watch your condition.  Will get help right away if you are not doing well or get worse. Document Released: 02/21/2005 Document Revised: 07/08/2013 Document Reviewed: 12/14/2012 [facility] Patient Information 2015 [place], [place]. This information is not intended to replace advice given to you by your health care provider. Make sure you discuss any questions you have with your health care provider.  Metformin tablets What is this medicine? METFORMIN (met FOR min) is used to treat type 2 diabetes. It helps to control blood sugar. Treatment is combined with diet and exercise. This medicine can be used alone or with other medicines for diabetes. This medicine may be used for other purposes; ask your health care provider or  pharmacist if you have questions. COMMON BRAND NAME(S): Glucophage What should I tell my health care provider before I take this medicine? They need to know if you have any of these conditions: -anemia -frequently drink alcohol-containing beverages -become easily dehydrated -heart attack -heart failure that is treated with medications -kidney disease -liver disease -polycystic ovary syndrome -serious infection or injury -vomiting -an unusual or allergic reaction to metformin, other medicines, foods, dyes, or preservatives -pregnant or trying to get pregnant -breast-feeding How should I use this medicine? Take this medicine by mouth. Take it with meals. Swallow the tablets with a drink of water. Follow the directions on the prescription label. Take your medicine at regular intervals. Do not take your medicine more often than directed. Talk to your pediatrician regarding the use of this medicine in children. While this drug may be prescribed for children as young as 16 years of age for selected conditions, precautions do apply. Overdosage: If you think you have taken too much of this medicine contact a poison control center or emergency room at once. NOTE: This medicine is only for you. Do not share this medicine with others. What if I miss a dose? If you miss a dose, take it as soon as you can. If it is almost time for your next dose, take only that dose. Do not take double or extra doses. What may interact with this medicine? Do not take this medicine with any of the following medications: -dofetilide -gatifloxacin -certain contrast medicines given before X-rays, CT scans, MRI, or other procedures This medicine may also interact with the following medications: -digoxin -diuretics -female hormones, like estrogens or progestins and birth control pills -isoniazid -medicines for blood pressure, heart disease, irregular heart beat -morphine -nicotinic acid -phenothiazines like  chlorpromazine, mesoridazine, prochlorperazine, thioridazine -phenytoin -procainamide -quinidine -quinine -ranitidine -steroid medicines like prednisone or cortisone -stimulant medicines for attention disorders, weight loss, or to stay awake -thyroid medicines -trimethoprim -vancomycin This list may not describe all possible interactions. Give your health care provider a list of all the medicines, herbs, non-prescription drugs, or dietary supplements you use. Also tell them if you smoke, drink alcohol, or use illegal drugs. Some items may interact with your medicine. What should I watch for while using this medicine? Visit your doctor or health care professional for regular checks on your progress. A test called the HbA1C (A1C) will be monitored. This is a simple blood test. It measures your blood sugar control over the last 2 to 3 months. You will receive this test every 3 to 6 months. Learn how to check your blood sugar. Learn the symptoms of low and high blood sugar and  how to manage them. Always carry a quick-source of sugar with you in case you have symptoms of low blood sugar. Examples include hard sugar candy or glucose tablets. Make sure others know that you can choke if you eat or drink when you develop serious symptoms of low blood sugar, such as seizures or unconsciousness. They must get medical help at once. Tell your doctor or health care professional if you have high blood sugar. You might need to change the dose of your medicine. If you are sick or exercising more than usual, you might need to change the dose of your medicine. Do not skip meals. Ask your doctor or health care professional if you should avoid alcohol. Many nonprescription cough and cold products contain sugar or alcohol. These can affect blood sugar. This medicine may cause ovulation in premenopausal women who do not have regular monthly periods. This may increase your chances of becoming pregnant. You should not  take this medicine if you become pregnant or think you may be pregnant. Talk with your doctor or health care professional about your birth control options while taking this medicine. Contact your doctor or health care professional right away if think you are pregnant. If you are going to need surgery, a MRI, CT scan, or other procedure, tell your doctor that you are taking this medicine. You may need to stop taking this medicine before the procedure. Wear a medical ID bracelet or chain, and carry a card that describes your disease and details of your medicine and dosage times. What side effects may I notice from receiving this medicine? Side effects that you should report to your doctor or health care professional as soon as possible: -allergic reactions like skin rash, itching or hives, swelling of the face, lips, or tongue -breathing problems -feeling faint or lightheaded, falls -muscle aches or pains -signs and symptoms of low blood sugar such as feeling anxious, confusion, dizziness, increased hunger, unusually weak or tired, sweating, shakiness, cold, irritable, headache, blurred vision, fast heartbeat, loss of consciousness -slow or irregular heartbeat -unusual stomach pain or discomfort -unusually tired or weak Side effects that usually do not require medical attention (report to your doctor or health care professional if they continue or are bothersome): -diarrhea -headache -heartburn -metallic taste in mouth -nausea -stomach gas, upset This list may not describe all possible side effects. Call your doctor for medical advice about side effects. You may report side effects to FDA at 1-800-FDA-1088. Where should I keep my medicine? Keep out of the reach of children. Store at room temperature between 15 and 30 degrees C (59 and 86 degrees F). Protect from moisture and light. Throw away any unused medicine after the expiration date. NOTE: This sheet is a summary. It may not cover all  possible information. If you have questions about this medicine, talk to your doctor, pharmacist, or health care provider.  2015, [employer]. (2012-06-05 16:03:44)  Metoclopramide tablets What is this medicine? METOCLOPRAMIDE (met oh kloe PRA mide) is used to treat the symptoms of gastroesophageal reflux disease (GERD) like heartburn. It is also used to treat people with slow emptying of the stomach and intestinal tract. This medicine may be used for other purposes; ask your health care provider or pharmacist if you have questions. COMMON BRAND NAME(S): Reglan What should I tell my health care provider before I take this medicine? They need to know if you have any of these conditions: -breast cancer -depression -diabetes -heart failure -high blood pressure -kidney disease -  liver disease -Parkinson's disease or a movement disorder -pheochromocytoma -seizures -stomach obstruction, bleeding, or perforation -an unusual or allergic reaction to metoclopramide, procainamide, sulfites, other medicines, foods, dyes, or preservatives -pregnant or trying to get pregnant -breast-feeding How should I use this medicine? Take this medicine by mouth with a glass of water. Follow the directions on the prescription label. Take this medicine on an empty stomach, about 30 minutes before eating. Take your doses at regular intervals. Do not take your medicine more often than directed. Do not stop taking except on the advice of your doctor or health care professional. A special MedGuide will be given to you by the pharmacist with each prescription and refill. Be sure to read this information carefully each time. Talk to your pediatrician regarding the use of this medicine in children. Special care may be needed. Overdosage: If you think you have taken too much of this medicine contact a poison control center or emergency room at once. NOTE: This medicine is only for you. Do not share this medicine  with others. What if I miss a dose? If you miss a dose, take it as soon as you can. If it is almost time for your next dose, take only that dose. Do not take double or extra doses. What may interact with this medicine? -acetaminophen -cyclosporine -digoxin -medicines for blood pressure -medicines for diabetes, including insulin -medicines for hay fever and other allergies -medicines for depression, especially an Monoamine Oxidase Inhibitor (MAOI) -medicines for Parkinson's disease, like levodopa -medicines for sleep or for pain -tetracycline This list may not describe all possible interactions. Give your health care provider a list of all the medicines, herbs, non-prescription drugs, or dietary supplements you use. Also tell them if you smoke, drink alcohol, or use illegal drugs. Some items may interact with your medicine. What should I watch for while using this medicine? It may take a few weeks for your stomach condition to start to get better. However, do not take this medicine for longer than 12 weeks. The longer you take this medicine, and the more you take it, the greater your chances are of developing serious side effects. If you are an elderly patient, a female patient, or you have diabetes, you may be at an increased risk for side effects from this medicine. Contact your doctor immediately if you start having movements you cannot control such as lip smacking, rapid movements of the tongue, involuntary or uncontrollable movements of the eyes, head, arms and legs, or muscle twitches and spasms. Patients and their families should watch out for worsening depression or thoughts of suicide. Also watch out for any sudden or severe changes in feelings such as feeling anxious, agitated, panicky, irritable, hostile, aggressive, impulsive, severely restless, overly excited and hyperactive, or not being able to sleep. If this happens, especially at the beginning of treatment or after a change in dose,  call your doctor. Do not treat yourself for high fever. Ask your doctor or health care professional for advice. You may get drowsy or dizzy. Do not drive, use machinery, or do anything that needs mental alertness until you know how this drug affects you. Do not stand or sit up quickly, especially if you are an older patient. This reduces the risk of dizzy or fainting spells. Alcohol can make you more drowsy and dizzy. Avoid alcoholic drinks. What side effects may I notice from receiving this medicine? Side effects that you should report to your doctor or health care professional as soon as  possible: -allergic reactions like skin rash, itching or hives, swelling of the face, lips, or tongue -abnormal production of milk in females -breast enlargement in both males and females -change in the way you walk -difficulty moving, speaking or swallowing -drooling, lip smacking, or rapid movements of the tongue -excessive sweating -fever -involuntary or uncontrollable movements of the eyes, head, arms and legs -irregular heartbeat or palpitations -muscle twitches and spasms -unusually weak or tired Side effects that usually do not require medical attention (report to your doctor or health care professional if they continue or are bothersome): -change in sex drive or performance -depressed mood -diarrhea -difficulty sleeping -headache -menstrual changes -restless or nervous This list may not describe all possible side effects. Call your doctor for medical advice about side effects. You may report side effects to FDA at 1-800-FDA-1088. Where should I keep my medicine? Keep out of the reach of children. Store at room temperature between 20 and 25 degrees C (68 and 77 degrees F). Protect from light. Keep container tightly closed. Throw away any unused medicine after the expiration date. NOTE: This sheet is a summary. It may not cover all possible information. If you have questions about this medicine,  talk to your doctor, pharmacist, or health care provider.  2015, [employer] Standard. (2011-06-21 13:04:38)

## 2014-07-18 NOTE — MAU Provider Note (Signed)
History     CSN: [id number]  Arrival date and time: 07/18/14 1916   First Provider Initiated Contact with Patient 07/18/14 1939      Chief Complaint  Patient presents with  . Hypertension  . Hyperglycemia   HPI [name] is a 29 y.o. [id number] who presents to MAU today with complaint of elevated blood pressure. The patient states that she has had issues with controlling her blood pressure since the delivery of her last child. She delivered in February. The patient is taking her medications as prescribed. She states that today she was very fatigued and disoriented. She states that she has a severe headache. She hasn't taken anything for pain. She also states that she had chest pain yesterday which resolved, but now is feeling it again. She denies focal weakness, numbness or tingling. Patient also states that she had surgery to remove half of her pancreas due to a cyst. She states that she was taking Glyburide, but immediately postpartum she was hypoglycemic, so her OB discontinued the medication. She states increased fatigue lately and has started checking her sugars again. She states at one point today her glucose was 333. The last glucose she had prior to arrival was 165.   OB History    Gravida Para Term Preterm AB TAB SAB Ectopic Multiple Living   5 2 2  3 1 2   0 2      Past Medical History  Diagnosis Date  . Pelvic inflammatory disease   . Pancreatitis     diet controlled  . Polycystic ovary syndrome   . Pancreatic cyst   . Asthma     rarely uses inhaler  . Missed ab     x 2, one requiring D & E  . 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 Bronchitis   . Bipolar disorder     HX PPD after 2014 delivery, no meds  . Anemia   . Hypertension     Past Surgical History  Procedure Laterality Date  . Splenectomy, total  2012  . Pancreas surgery  2012    s/p partial pancreatectomy  . Cesarean section N/A 05/23/2012   Procedure: CESAREAN SECTION;  Surgeon: Lahoma Crocker, MD;  Location: [facility];  Service: Obstetrics;  Laterality: N/A;  primary  . Dilation and curettage of uterus       for MAB  . Wisdom tooth extraction    . Cesarean section N/A 04/18/2014    Procedure: REPEAT CESAREAN SECTION;  Surgeon: Shelly Bombard, MD;  Location: [facility];  Service: Obstetrics;  Laterality: N/A;    Family History  Problem Relation Age of Onset  . Diabetes Mother   . Colon cancer Neg Hx     History  Substance Use Topics  . Smoking status: Current Every Day Smoker -- 0.50 packs/day for 13 years    Types: Cigarettes  . Smokeless tobacco: Never Used  . Alcohol Use: 0.0 oz/week    0 Standard drinks or equivalent per week     Comment: social     Allergies:  Allergies  Allergen Reactions  . Dilaudid [Hydromorphone Hcl] Itching and Other (See Comments)    hallucinations "Felt like she was losing her mind"    Prescriptions prior to admission  Medication Sig Dispense Refill Last Dose  . albuterol (PROAIR HFA) 108 (90 BASE) MCG/ACT inhaler Inhale 2 puffs into the lungs every 6 (six) hours as needed for wheezing or shortness of  breath.    Taking  . amLODipine (NORVASC) 10 MG tablet Take 1 tablet (10 mg total) by mouth daily. 30 tablet 5 Taking  . carvedilol (COREG) 12.5 MG tablet Take 1 tablet (12.5 mg total) by mouth 2 (two) times daily with a meal. 60 tablet 5 Taking  . glyBURIDE (DIABETA) 1.25 MG tablet 1.25 mg 30 minutes before breakfast, 1.25 mg 30 minutes before lunch and 2.5 mg at bedtime. (Patient not taking: Reported on 05/08/2014) 120 tablet 2 Not Taking  . Iron-FA-B Cmp-C-Biot-Probiotic (FUSION PLUS) CAPS Take 1 capsule by mouth daily before breakfast. 30 capsule 5 Taking  . Oxycodone HCl 10 MG TABS Take 1 tablet (10 mg total) by mouth every 6 (six) hours as needed. 40 tablet 0 Taking  . Prenat-FeCbn-FeAspGl-FA-Omega (OB COMPLETE PETITE) 35-5-1-200 MG CAPS Take 1 capsule by mouth daily. 30 capsule 11  Taking  . propranolol-hydrochlorothiazide (INDERIDE) 80-25 MG per tablet Take 1 tablet by mouth daily. In the morning. 30 tablet 12   . sertraline (ZOLOFT) 50 MG tablet Take 1 tablet (50 mg total) by mouth daily. 30 tablet 5 Taking  . Sinecatechins (VEREGEN) 15 % OINT Apply 1 application topically 3 (three) times daily. For 16 weeks 30 g 2     Review of Systems  Constitutional: Positive for malaise/fatigue. Negative for fever.  Eyes: Negative for blurred vision.  Respiratory: Negative for shortness of breath.   Cardiovascular: Positive for chest pain.  Neurological: Positive for dizziness, weakness and headaches. Negative for sensory change, speech change, focal weakness and loss of consciousness.   Physical Exam   Blood pressure 125/92, pulse 73, temperature 97.8 F (36.6 C), resp. rate 18, height 5' (1.524 m), weight 156 lb 12.8 oz (71.124 kg), SpO2 100 %, currently breastfeeding.  Physical Exam  Nursing note and vitals reviewed. Constitutional: She is oriented to person, place, and time. She appears well-developed and well-nourished. No distress.  HENT:  Head: Normocephalic and atraumatic.  Eyes: EOM are normal. Pupils are equal, round, and reactive to light.  Cardiovascular: Normal rate, regular rhythm and normal heart sounds.   Respiratory: Effort normal and breath sounds normal. No respiratory distress. She has no wheezes.  GI: Soft.  Musculoskeletal: Normal range of motion.  Neurological: She is alert and oriented to person, place, and time. She has normal strength and normal reflexes. No cranial nerve deficit. Coordination normal.  Skin: Skin is warm and dry. No erythema.  Psychiatric: She has a normal mood and affect.   MAU Course  Procedures None  MDM Discussed patient with Dr. Ruthann Cancer. Agrees with plan to transfer patient to [facility] for further evaluation as she is no longer within the PP period Discussed patient with Dr. Roxanne Mins at [facility] He will accept patient for  transfer for further evaluation of headache.  EKG - normal sinus rhythm 1 Fioricet given Assessment and Plan  A: Hypertension Headache Chest pain Poor glycemic control  P: Transfer to [facility] by [facility] for further evaluation.   Luvenia Redden, PA-C  07/18/2014, 8:11 PM

## 2014-07-18 NOTE — ED Notes (Signed)
cbg 125

## 2014-07-21 LAB — PAP IG AND HPV HIGH-RISK: HPV DNA High Risk: DETECTED — AB

## 2014-07-31 ENCOUNTER — Telehealth: Payer: Self-pay | Admitting: *Deleted

## 2014-07-31 DIAGNOSIS — N939 Abnormal uterine and vaginal bleeding, unspecified: Secondary | ICD-10-CM

## 2014-07-31 MED ORDER — NORETHINDRONE-ETH ESTRADIOL 1-35 MG-MCG PO TABS: ORAL_TABLET | ORAL | Status: DC

## 2014-07-31 NOTE — Telephone Encounter (Signed)
Patient calling for lab results- normal pap with + HPV- repeat 1 year. Patient report bleeding reoccurrence.  ON 1/35 2/d x7d- patient given instructions and knows SE profile of pills. Patient has primary care appointment 6/8 and was evaluated at ED for glucose and heart scare which was fine.

## 2014-08-13 ENCOUNTER — Encounter: Payer: Self-pay | Admitting: Cardiology

## 2014-08-13 ENCOUNTER — Ambulatory Visit (INDEPENDENT_AMBULATORY_CARE_PROVIDER_SITE_OTHER): Payer: Medicaid Other | Admitting: Cardiology

## 2014-08-13 ENCOUNTER — Telehealth: Payer: Self-pay | Admitting: Gastroenterology

## 2014-08-13 VITALS — BP 118/78 | HR 76 | Ht 60.0 in | Wt 156.0 lb

## 2014-08-13 DIAGNOSIS — I10 Essential (primary) hypertension: Secondary | ICD-10-CM | POA: Diagnosis not present

## 2014-08-13 DIAGNOSIS — O10919 Unspecified pre-existing hypertension complicating pregnancy, unspecified trimester: Secondary | ICD-10-CM | POA: Insufficient documentation

## 2014-08-13 DIAGNOSIS — O24912 Unspecified diabetes mellitus in pregnancy, second trimester: Secondary | ICD-10-CM

## 2014-08-13 NOTE — Patient Instructions (Signed)
Reduce amlodipine to 5 mg daily  We will schedule you a follow up in 2-3 months to see if we can wean your medication further

## 2014-08-13 NOTE — Telephone Encounter (Signed)
Yes.  Thank you.

## 2014-08-13 NOTE — Progress Notes (Signed)
Cardiology Office Note   Date:  08/13/2014   ID:  [name], DOB 1985-08-10, MRN [id number]  PCP:  Reports no primary care.   Cardiologist:   Agueda Houpt [place], MD   Chief Complaint  Patient presents with  . Advice Only    Chest pains, light headed, and dizzy      History of Present Illness: [name] is a 29 y.o. female who presents for evaluation of HTN. She reports that she had delivery of her second son in February. Since then she states her BP shot up with readings over 962 diastolic. She received magnesium and was started on antihypertensive therapy. Over the past 3 weeks BP has returned to normal. She has changed her diet- eating high potassium foods and avoiding salt. She also reports her diabetes was uncontrolled and she was started on metformin. 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 She is followed at [facility] for this. Abdominal CT 2 weeks ago was stable.     Past Medical History  Diagnosis Date  . Pelvic inflammatory disease   . Pancreatitis     diet controlled  . Polycystic ovary syndrome   . Pancreatic cyst   . Asthma     rarely uses inhaler  . Missed ab     x 2, one requiring D & E  . 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 Bronchitis   . Bipolar disorder     HX PPD after 2014 delivery, no meds  . Anemia   . Hypertension     Past Surgical History  Procedure Laterality Date  . Splenectomy, total  2012  . Pancreas surgery  2012    s/p partial pancreatectomy  . Cesarean section N/A 05/23/2012    Procedure: CESAREAN SECTION;  Surgeon: Lahoma Crocker, MD;  Location: [facility];  Service: Obstetrics;  Laterality: N/A;  primary  . Dilation and curettage of uterus       for MAB  . Wisdom tooth extraction    . Cesarean section N/A 04/18/2014    Procedure: REPEAT CESAREAN SECTION;  Surgeon: Shelly Bombard, MD;  Location: [facility];  Service:  Obstetrics;  Laterality: N/A;     Current Outpatient Prescriptions  Medication Sig Dispense Refill  . albuterol (PROAIR HFA) 108 (90 BASE) MCG/ACT inhaler Inhale 2 puffs into the lungs every 6 (six) hours as needed for wheezing or shortness of breath.     Marland Kitchen amLODipine (NORVASC) 10 MG tablet Take 1 tablet (10 mg total) by mouth daily. 30 tablet 5  . carvedilol (COREG) 12.5 MG tablet Take 1 tablet (12.5 mg total) by mouth 2 (two) times daily with a meal. 60 tablet 5  . Iron-FA-B Cmp-C-Biot-Probiotic (FUSION PLUS) CAPS Take 1 capsule by mouth daily before breakfast. 30 capsule 5  . metFORMIN (GLUCOPHAGE) 500 MG tablet Take 1 tablet (500 mg total) by mouth 2 (two) times daily with a meal. 60 tablet 0  . metoCLOPramide (REGLAN) 10 MG tablet Take 1 tablet (10 mg total) by mouth every 6 (six) hours as needed for nausea (or headache). 30 tablet 0  . norethindrone-ethinyl estradiol 1/35 ([place] 1/35, 28,) tablet Take 2 tablets daily for 7 days for break through bleeding with IUD 14 Package 0  . Prenat-FeCbn-FeAspGl-FA-Omega (OB COMPLETE PETITE) 35-5-1-200 MG CAPS Take 1 capsule by mouth daily. 30 capsule 11   No current facility-administered medications for this visit.  Allergies:   Dilaudid    Social History:  The patient  reports that she has been smoking Cigarettes.  She has a 6.5 pack-year smoking history. She has never used smokeless tobacco. She reports that she drinks alcohol. She reports that she does not use illicit drugs.   Family History:  The patient's family history includes Asthma in her father; Diabetes in her mother; Hypertension in her mother. There is no history of Colon cancer.    ROS:  Please see the history of present illness.   Otherwise, review of systems are positive for upper abdominal pain radiating to her back. She insists this is pancreatitis despite recent CT results and normal lipase.    All other systems are reviewed and negative.    PHYSICAL EXAM: VS:  BP  118/78 mmHg  Pulse 76  Ht 5' (1.524 m)  Wt 70.761 kg (156 lb)  BMI 30.47 kg/m2 , BMI Body mass index is 30.47 kg/(m^2). GEN: Well nourished, well developed BF, in no acute distress HEENT: normal Neck: no JVD, carotid bruits, or masses Cardiac: RRR; no murmurs, rubs, or gallops,no edema  Respiratory:  clear to auscultation bilaterally, normal work of breathing GI: soft, nontender, nondistended, + BS MS: no deformity or atrophy Skin: warm and dry, no rash Neuro:  Strength and sensation are intact Psych: euthymic mood, full affect   EKG:  EKG is not ordered today. The ekg done 08/05/14 in the ED demonstrates Normal Ecg. I have personally reviewed and interpreted this study.    Recent Labs: 12/25/2013: TSH 1.221 07/18/2014: ALT 15; BUN 8; Creatinine, Ser 0.55; Hemoglobin 12.2; Platelets 514*; Potassium 3.8; Sodium 138    Lipid Panel No results found for: CHOL, TRIG, HDL, CHOLHDL, VLDL, LDLCALC, LDLDIRECT    Wt Readings from Last 3 Encounters:  08/13/14 70.761 kg (156 lb)  07/18/14 71.124 kg (156 lb 12.8 oz)  07/17/14 72.122 kg (159 lb)      Other studies Reviewed: Additional studies/ records that were reviewed today include: None  Review of the above records demonstrates: none   ASSESSMENT AND PLAN:  1. HTN- probably related to pregnancy and pre-eclampsia. BP now normal. No plans for further pregnancies and has IUD in place. Will decrease amlodipine to 5 mg daily. Will follow up in 2-3 months. I anticipate we will be able to wean BP medications further and possibly discontinue.   2. DM - needs follow up with primary care  3. Abdominal pain. No clear evidence of pancreatitis on recent CT and lab work. Will follow up with physician at [facility].   Current medicines are reviewed at length with the patient today.  The patient does not have concerns regarding medicines.  The following changes have been made:  See above  Labs/ tests ordered today include: none  No  orders of the defined types were placed in this encounter.     Disposition:   FU with  in 2-3 months.  Signed, Mackey Varricchio [place], [place] 08/13/2014 1:39 PM    [facility] [address], [place], [place], [address] Phone [phone], Fax [phone]

## 2014-08-20 NOTE — Telephone Encounter (Signed)
Spoke to pt and explained we needs records. She will have [facility] fax records to [place].

## 2014-08-20 NOTE — Telephone Encounter (Signed)
Left message for the pt to call back.

## 2014-08-21 ENCOUNTER — Ambulatory Visit: Payer: Medicaid Other | Admitting: Certified Nurse Midwife

## 2014-08-28 ENCOUNTER — Telehealth: Payer: Self-pay | Admitting: Cardiology

## 2014-08-28 NOTE — Telephone Encounter (Signed)
Received records from [facility] for appointment on 10/30/14 with Dr Martinique.  Records given to [facility] (medical records) for Dr Doug Sou schedule on 10/30/14. lp

## 2014-09-01 ENCOUNTER — Other Ambulatory Visit: Payer: Self-pay | Admitting: Obstetrics

## 2014-09-01 DIAGNOSIS — N939 Abnormal uterine and vaginal bleeding, unspecified: Secondary | ICD-10-CM

## 2014-09-01 MED ORDER — MEDROXYPROGESTERONE ACETATE 10 MG PO TABS: ORAL_TABLET | ORAL | Status: DC

## 2014-09-01 MED ORDER — ESTROGENS CONJUGATED 1.25 MG PO TABS: 2.5000 mg | ORAL_TABLET | Freq: Once | ORAL | Status: DC

## 2014-09-03 ENCOUNTER — Other Ambulatory Visit: Payer: Self-pay | Admitting: Obstetrics

## 2014-10-16 ENCOUNTER — Other Ambulatory Visit: Payer: Self-pay | Admitting: Obstetrics

## 2014-10-21 ENCOUNTER — Encounter (HOSPITAL_COMMUNITY): Payer: Self-pay

## 2014-10-21 NOTE — Progress Notes (Signed)
Disability paperwork received by [employer] to CHF Clinic at [facility]. Patient has neveer been seen in our clinic or by our providers. Notation made of this and fax returned.  Renee Pain

## 2014-10-29 ENCOUNTER — Telehealth: Payer: Self-pay | Admitting: *Deleted

## 2014-10-29 NOTE — Telephone Encounter (Signed)
Patient reports she is having heavy BTB with her IUD again and wants medication.  11:42 Attempted to call [employer] and got busy signal. [phone]

## 2014-10-29 NOTE — Telephone Encounter (Signed)
Patient gave wrong number- [phone] 11:44 Patient has been bleeding for 4 weeks- she never took the HRT for BTB because she stopped- but she state she never picked up the corrected dosing of the Premarin because it was never called in- told patient I would have doctor Jodi Mourning review and would call her back.

## 2014-10-30 ENCOUNTER — Ambulatory Visit: Payer: Medicaid Other | Admitting: Cardiology

## 2014-11-18 ENCOUNTER — Ambulatory Visit (INDEPENDENT_AMBULATORY_CARE_PROVIDER_SITE_OTHER): Payer: Medicaid Other | Admitting: Obstetrics

## 2014-11-18 ENCOUNTER — Encounter: Payer: Self-pay | Admitting: Obstetrics

## 2014-11-18 VITALS — BP 115/86 | HR 84 | Temp 98.3°F | Wt 165.0 lb

## 2014-11-18 DIAGNOSIS — Z30432 Encounter for removal of intrauterine contraceptive device: Secondary | ICD-10-CM

## 2014-11-18 DIAGNOSIS — Z30011 Encounter for initial prescription of contraceptive pills: Secondary | ICD-10-CM

## 2014-11-18 MED ORDER — NORETHIN ACE-ETH ESTRAD-FE 1-20 MG-MCG(24) PO TABS: 1.0000 | ORAL_TABLET | Freq: Every day | ORAL | Status: DC

## 2014-11-18 NOTE — Addendum Note (Signed)
Addended by: Lewie Loron D on: 11/18/2014 04:17 PM   Modules accepted: Orders

## 2014-11-18 NOTE — Progress Notes (Signed)
Subjective:    [name] is a 29 y.o. female who presents for IUD removal. The patient has no complaints today. The patient is sexually active. Pertinent past medical history: hypertension.  The information documented in the HPI was reviewed and verified.  Menstrual History: OB History    Gravida Para Term Preterm AB TAB SAB Ectopic Multiple Living   5 2 2  3 1 2   0 2       No LMP recorded. Patient is not currently having periods (Reason: IUD).   Patient Active Problem List   Diagnosis Date Noted  . HTN (hypertension) 08/13/2014  . Pre-eclampsia 04/29/2014  . S/P cesarean section 04/18/2014  . [redacted] weeks gestation of pregnancy   . [redacted] weeks gestation of pregnancy   . [redacted] weeks gestation of pregnancy   . [redacted] weeks gestation of pregnancy   . Diabetes mellitus in pregnancy, antepartum   . Diabetes mellitus in pregnancy in second trimester   . [redacted] weeks gestation of pregnancy   . Encounter for routine screening for malformation using ultrasonics   . 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 Vitamin D deficiency 12/29/2013  . H/O macrosomia in infant in prior pregnancy, currently pregnant 12/25/2013  . Previous cesarean delivery, antepartum 12/25/2013  . Tobacco use during pregnancy 12/25/2013  . Asthma 12/25/2013  . Pregnancy and non-insulin-dependent diabetes mellitus 05/22/2012  . Genital warts 12/22/2011  . Pancreatic pseudocyst 06/24/2010   Past Medical History  Diagnosis Date  . Pelvic inflammatory disease   . Pancreatitis     diet controlled  . Polycystic ovary syndrome   . Pancreatic cyst   . Asthma     rarely uses inhaler  . Missed ab     x 2, one requiring D & E  . 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 Bronchitis   . Bipolar disorder     HX PPD after 2014 delivery, no meds  . Anemia   . Hypertension     Past Surgical History  Procedure Laterality Date  . Splenectomy, total  2012  . Pancreas surgery  2012    s/p  partial pancreatectomy  . Cesarean section N/A 05/23/2012    Procedure: CESAREAN SECTION;  Surgeon: Lahoma Crocker, MD;  Location: [facility];  Service: Obstetrics;  Laterality: N/A;  primary  . Dilation and curettage of uterus       for MAB  . Wisdom tooth extraction    . Cesarean section N/A 04/18/2014    Procedure: REPEAT CESAREAN SECTION;  Surgeon: Shelly Bombard, MD;  Location: [facility];  Service: Obstetrics;  Laterality: N/A;     Current outpatient prescriptions:  .  lipase/protease/amylase (CREON) 12000 UNITS CPEP capsule, Take by mouth., Disp: , Rfl:  .  Norethindrone Acetate-Ethinyl Estrad-FE (LOESTRIN 24 FE) 1-20 MG-MCG(24) tablet, Take 1 tablet by mouth daily., Disp: 1 Package, Rfl: 11 Allergies  Allergen Reactions  . Dilaudid [Hydromorphone Hcl] Itching and Other (See Comments)    hallucinations "Felt like she was losing her mind"    Social History  Substance Use Topics  . Smoking status: Current Every Day Smoker -- 0.50 packs/day for 13 years    Types: Cigarettes  . Smokeless tobacco: Never Used  . Alcohol Use: No     Comment: social     Family History  Problem Relation Age of Onset  . Diabetes Mother   . Hypertension Mother   . Colon cancer Neg Hx   .  Asthma Father        Review of Systems Constitutional: negative for weight loss Genitourinary: positive for abnormal menstrual periods and negative for vaginal discharge   Objective:   BP 115/86 mmHg  Pulse 84  Temp(Src) 98.3 F (36.8 C)  Wt 165 lb (74.844 kg)          General:  Alert and no distress Abdomen:  normal findings: no organomegaly, soft, non-tender and no hernia  Pelvis:  External genitalia: normal general appearance Urinary system: urethral meatus normal and bladder without fullness, nontender Vaginal: normal without tenderness, induration or masses Cervix: normal appearance.  IUD string visualized and grasped with dressing forceps and removed, intact. Adnexa: normal bimanual exam Uterus:  anteverted and non-tender, normal size   Lab Review Urine pregnancy test Labs reviewed yes Radiologic studies reviewed yes    Assessment:    29 y.o., discontinuing IUD, no contraindications.    Contraceptive management  Plan:   Loestrin 6 Fe Rx   All questions answered. Chlamydia specimen. Discussed healthy lifestyle modifications. Neurosurgeon distributed. Follow up as needed. GC specimen. Wet prep.    Meds ordered this encounter  Medications  . lipase/protease/amylase (CREON) 12000 UNITS CPEP capsule    Sig: Take by mouth.  . Norethindrone Acetate-Ethinyl Estrad-FE (LOESTRIN 24 FE) 1-20 MG-MCG(24) tablet    Sig: Take 1 tablet by mouth daily.    Dispense:  1 Package    Refill:  11   No orders of the defined types were placed in this encounter.

## 2014-11-22 LAB — SURESWAB, VAGINOSIS/VAGINITIS PLUS
ATOPOBIUM VAGINAE: NOT DETECTED Log (cells/mL)
BV CATEGORY: UNDETERMINED — AB
C. PARAPSILOSIS, DNA: NOT DETECTED
C. albicans, DNA: NOT DETECTED
C. glabrata, DNA: NOT DETECTED
C. trachomatis RNA, TMA: NOT DETECTED
C. tropicalis, DNA: NOT DETECTED
Gardnerella vaginalis: 6.2 Log (cells/mL)
LACTOBACILLUS SPECIES: 5.8 Log (cells/mL)
MEGASPHAERA SPECIES: NOT DETECTED Log (cells/mL)
N. GONORRHOEAE RNA, TMA: NOT DETECTED
T. vaginalis RNA, QL TMA: NOT DETECTED

## 2014-11-23 ENCOUNTER — Other Ambulatory Visit: Payer: Self-pay | Admitting: Obstetrics

## 2014-11-23 DIAGNOSIS — N760 Acute vaginitis: Principal | ICD-10-CM

## 2014-11-23 DIAGNOSIS — B9689 Other specified bacterial agents as the cause of diseases classified elsewhere: Secondary | ICD-10-CM

## 2014-11-23 MED ORDER — METRONIDAZOLE 500 MG PO TABS: 500.0000 mg | ORAL_TABLET | Freq: Two times a day (BID) | ORAL | Status: DC

## 2015-04-13 ENCOUNTER — Encounter: Payer: Self-pay | Admitting: *Deleted

## 2015-04-17 ENCOUNTER — Ambulatory Visit (INDEPENDENT_AMBULATORY_CARE_PROVIDER_SITE_OTHER): Payer: Medicaid Other | Admitting: Internal Medicine

## 2015-04-17 ENCOUNTER — Encounter: Payer: Self-pay | Admitting: Internal Medicine

## 2015-04-17 ENCOUNTER — Other Ambulatory Visit (INDEPENDENT_AMBULATORY_CARE_PROVIDER_SITE_OTHER): Payer: Medicaid Other

## 2015-04-17 VITALS — BP 118/78 | HR 78 | Ht 60.0 in | Wt 167.0 lb

## 2015-04-17 DIAGNOSIS — R110 Nausea: Secondary | ICD-10-CM

## 2015-04-17 DIAGNOSIS — Z8719 Personal history of other diseases of the digestive system: Secondary | ICD-10-CM

## 2015-04-17 DIAGNOSIS — K30 Functional dyspepsia: Secondary | ICD-10-CM

## 2015-04-17 DIAGNOSIS — D490 Neoplasm of unspecified behavior of digestive system: Secondary | ICD-10-CM

## 2015-04-17 DIAGNOSIS — R1013 Epigastric pain: Secondary | ICD-10-CM

## 2015-04-17 LAB — COMPREHENSIVE METABOLIC PANEL
ALT: 15 U/L (ref 0–35)
AST: 15 U/L (ref 0–37)
Albumin: 4.3 g/dL (ref 3.5–5.2)
Alkaline Phosphatase: 50 U/L (ref 39–117)
BILIRUBIN TOTAL: 0.4 mg/dL (ref 0.2–1.2)
BUN: 8 mg/dL (ref 6–23)
CHLORIDE: 106 meq/L (ref 96–112)
CO2: 27 meq/L (ref 19–32)
Calcium: 9.2 mg/dL (ref 8.4–10.5)
Creatinine, Ser: 0.56 mg/dL (ref 0.40–1.20)
GFR: 162.41 mL/min (ref >60.00)
GLUCOSE: 100 mg/dL — AB (ref 70–99)
Potassium: 3.9 mEq/L (ref 3.5–5.1)
Sodium: 138 mEq/L (ref 135–145)
Total Protein: 7.6 g/dL (ref 6.0–8.3)

## 2015-04-17 LAB — LIPASE: LIPASE: 5 U/L — AB (ref 11.0–59.0)

## 2015-04-17 LAB — AMYLASE: AMYLASE: 25 U/L — AB (ref 27–131)

## 2015-04-17 MED ORDER — OXYCODONE HCL 10 MG PO TABS: ORAL_TABLET | ORAL | Status: DC

## 2015-04-17 NOTE — Patient Instructions (Addendum)
Your physician has requested that you go to the basement for the following lab work before leaving today: Lipase, amylase, CMP, CBC, IgG subclass  Please continue creon 2 capsules with each meal  Continue pantoprazole.  We have given you a written prescription for oxycodone 10 mg to take 1-2 tablets by mouth every 4-6 hours as needed only for severe abdominal pain.  Please continue to refrain from all alcohol use.   Please follow up with [name] in 3 months.  You have been scheduled for an MRI/MRCP at [facility] on Wednesday, 04/22/15. Your appointment time is 8:00 am. Please arrive 15 minutes prior to your appointment time for registration purposes. Please make certain not to have anything to eat or drink 6 hours prior to your test. In addition, if you have any metal in your body, have a pacemaker or defibrillator, please be sure to let your ordering physician know. This test typically takes 45 minutes to 1 hour to complete.

## 2015-04-17 NOTE — Progress Notes (Signed)
Patient ID: [name], female   DOB: 1985/04/30, 30 y.o.   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 Jeanie Cooks to evaluate epigastric abdominal pain and possible pancreatitis. She is here alone today. She reports that last week she developed severe epigastric abdominal pain radiating to her mid back. It was associated with nausea but not vomiting. She had flare of indigestion and reflux during this time as well. She dramatically decreased oral intake and was drinking mostly liquids. Prior to the pain becoming severe she had several days of gnawing epigastric abdominal pain. Slowly over several days the pain improved and now has resolved. She's had several attacks of similar pain over the last year. She saw Dr. Eugenia Pancoast her pancreatic surgeon at [facility] in May 2016 and CT scan of the abdomen and pelvis was done and unrevealing. It was reviewed today and pasted below. In between attacks of pain she reports she functions and feels well. She has 2 young sons age nearly 29 and 4-year-old. From her surgery in 2012 she had 3-4 years without any episodes of pancreatitis type pain. Bowel movements have been regular recently without blood or melena. She does take Creon 2 tablets before each meal. During her severe episodes of epigastric pain her stools are often loose and oily. She does not drink alcohol and smokes about 4 cigarettes per day.  She reports being told that her initial pancreatitis episodes may have been due to a call abuse. She adamantly denies alcohol abuse and reports she drank alcohol socially on the weekends only but was never a daily or heavy drinker denied binge drinking.  Pantoprazole was recently started because of indigestion occurring around  flares of abdominal pain. With this she's had no further heartburn or indigestion.  She denies a family history of pancreatitis and GI tract malignancy.   Past Medical History  Diagnosis Date  . Pelvic inflammatory disease   . Pancreatitis     diet controlled  . Polycystic ovary syndrome   . Pancreatic cyst   . Asthma     rarely uses inhaler  . Missed ab     x 2, one requiring D & E  . Diabetes mellitus without complication ([place])     partial pancreatectomy 2012-glyburide  . Bronchitis   . Bipolar disorder ([place])     HX PPD after 2014 delivery, no meds  . Anemia   . Hypertension   . Alcohol abuse   . Renal cyst     Past Surgical History  Procedure Laterality Date  . Splenectomy, total  2012  . Pancreas surgery  2012    s/p partial pancreatectomy  . Cesarean section N/A 05/23/2012    Procedure: CESAREAN SECTION;  Surgeon: Lahoma Crocker, MD;  Location: [facility];  Service: Obstetrics;  Laterality: N/A;  primary  . Dilation and curettage of uterus       for MAB  . Wisdom tooth extraction    . Cesarean section N/A 04/18/2014    Procedure: REPEAT CESAREAN SECTION;  Surgeon: Shelly Bombard, MD;  Location: [facility];  Service: Obstetrics;  Laterality: N/A;    Outpatient Prescriptions Prior to Visit  Medication Sig Dispense Refill  . lipase/protease/amylase (CREON) 12000 UNITS CPEP capsule Take 24,000 Units by mouth 3 (three) times  daily before meals.     . Norethindrone Acetate-Ethinyl Estrad-FE (LOESTRIN 24 FE) 1-20 MG-MCG(24) tablet Take 1 tablet by mouth daily. 1 Package 11  . metroNIDAZOLE (FLAGYL) 500 MG tablet Take 1 tablet (500 mg total) by mouth 2 (two) times daily. 14 tablet 2   No facility-administered medications prior to visit.    Allergies  Allergen Reactions  . Dilaudid [Hydromorphone Hcl] Itching and Other (See Comments)    hallucinations "Felt like she was losing her mind"    Family History  Problem Relation Age of Onset  . Diabetes Mother   .  Hypertension Mother   . Colon cancer Neg Hx   . Asthma Father     Social History  Substance Use Topics  . Smoking status: Current Every Day Smoker -- 13 years    Types: Cigarettes  . Smokeless tobacco: Never Used     Comment: smokes 4 cigs/day, tobacco info given 04/17/15  . Alcohol Use: No     Comment: social     ROS: As per history of present illness, otherwise negative  BP 118/78 mmHg  Pulse 78  Ht 5' (1.524 m)  Wt 167 lb (75.751 kg)  BMI 32.62 kg/m2  Breastfeeding? No Constitutional: Well-developed and well-nourished. No distress. HEENT: Normocephalic and atraumatic. Oropharynx is clear and moist. No oropharyngeal exudate. Conjunctivae are normal.  No scleral icterus. Neck: Neck supple. Trachea midline. Cardiovascular: Normal rate, regular rhythm and intact distal pulses. No M/R/G Pulmonary/chest: Effort normal and breath sounds normal. No wheezing, rales or rhonchi. Abdominal: Soft, epigastric tenderness without rebound or guarding, nondistended. Bowel sounds active throughout. There are no masses palpable.  Extremities: no clubbing, cyanosis, or edema Lymphadenopathy: No cervical adenopathy noted. Neurological: Alert and oriented to person place and time. Skin: Skin is warm and dry. No rashes noted. Psychiatric: Normal mood and affect. Behavior is normal.  RELEVANT LABS AND IMAGING: CBC    Component Value Date/Time   WBC 5.2 07/18/2014 2132   RBC 5.34* 07/18/2014 2132   HGB 12.2 07/18/2014 2132   HCT 36.1 07/18/2014 2132   PLT 514* 07/18/2014 2132   MCV 67.6* 07/18/2014 2132   MCH 22.8* 07/18/2014 2132   MCHC 33.8 07/18/2014 2132   RDW 17.5* 07/18/2014 2132   LYMPHSABS 2.3 07/18/2014 2132   MONOABS 0.5 07/18/2014 2132   EOSABS 0.1 07/18/2014 2132   BASOSABS 0.1 07/18/2014 2132    CMP     Component Value Date/Time   NA 138 07/18/2014 2132   K 3.8 07/18/2014 2132   CL 104 07/18/2014 2132   CO2 24 07/18/2014 2132   GLUCOSE 112* 07/18/2014 2132   BUN 8  07/18/2014 2132   CREATININE 0.55 07/18/2014 2132   CREATININE 0.38* 02/05/2014 1625   CALCIUM 9.1 07/18/2014 2132   PROT 7.3 07/18/2014 2132   ALBUMIN 3.9 07/18/2014 2132   AST 17 07/18/2014 2132   ALT 15 07/18/2014 2132   ALKPHOS 71 07/18/2014 2132   BILITOT 0.2* 07/18/2014 2132   GFRNONAA >60 07/18/2014 2132   GFRAA >60 07/18/2014 2132    Result Impression   Status post partial pancreatectomy. No abnormality seen in pancreatectomy site.  Unchanged 8 mm nodule left adrenal gland, likely benign due to lack of change since 2013.  Decreased in size of right upper pole renal cyst when compared with previous exam   Result Narrative  CT OF THE ABDOMEN AND PELVIS WITH INTRAVENOUS CONTRAST (PANCREAS PROTOCOL), Aug 01, 2014 12:51:29 PM . INDICATION:  pancreas massK86.9 Pancreatic mass  .  COMPARISON: 05/27/2011 . TECHNIQUE: After the administration of intravenous contrast, axial images of the abdomen were obtained in the arterial and portal venous phases. Axial images of the pelvis were obtained in the portal venous phase. Supplemental 2D reformatted images were generated and reviewed as needed. . LOWER CHEST: .  Mediastinum/hila: Within normal limits. .  Heart/vessels: Within normal limits. .  Pleura: Within normal limits. .  Lungs: Within normal limits. . ABDOMEN: .  Liver: Within normal limits .  Gallbladder/biliary: Within normal limits. .  Spleen: Status post splenectomy. .  Pancreas: Status post subtotal distal pancreatectomy 4 mucinous cystic neoplasm of the pancreas no abnormality seen in the pancreatectomy site. .  Adrenals: 8 Millimeter nodule left adrenal gland unchanged from previous study in 2013. Marland Kitchen  Kidneys: The cystic lesion in the right upper pole has markedly decreased in size now measuring 1.6 cm compared to 3.4 cm on the previous study. .  Peritoneum/mesenteries: Within normal limits. .  Extraperitoneum: Within normal limits. .  Gastrointestinal tract: Within  normal limits. .  Vascular: Within normal limits. Marland Kitchen PELVIS: .  Peritoneum: Within normal limits. .  Extraperitoneum: Within normal limits. .  Ureters: Within normal limits. .  Bladder: Within normal limits. .  Reproductive System: Within normal limits. .  Vascular: Within normal limits. . MSK: Within normal limits. .   Distal pancreatectomy and splenectomy July 2012 Path report: SURGICAL PATHOLOGY REPORT  FINAL PATHOLOGIC DIAGNOSIS  MICROSCOPIC EXAMINATION PERFORMED AND SUPPORTS DIAGNOSES.   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 Nineteen benign lymph nodes (19). Surgical resection margins are negative.  SPLEEN: No diagnostic abnormality.   Report Prepared By: Marvia Pickles, M.D.    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 Jeanie Cooks to evaluate epigastric abdominal pain and possible pancreatitis.  1. History of cystic pancreatic neoplasm status post distal pancreatectomy and splenectomy 2012/epigastric abdominal pain -- her attacks seem very much consistent with acute pancreatitis. I'm not convinced that she has chronic pancreatitis but this is certainly possible. Check labs today to include CBC, CMP, amylase and lipase. Check IgG subclass 4 for possible autoimmune pancreatitis. Given her history of IPMN and recent symptoms of recommended repeating cross-sectional imaging. MRI pancreas protocol with MRCP to exclude any postsurgical pancreatic duct abnormality such as stricture which would lead to possible pancreatitis intermittently. Also rule out recurrence of cystic neoplasm. Continue Creon 2 tablets with meals. Low-fat diet. I will give her a prescription for oxycodone after our  thorough discussion regarding the risks of this medication and habit forming nature. I do fully believe she is having attacks of pancreatitis type pain given her history, and she will use the narcotic sparingly. The 5 mg dose given by primary care has not been sufficient for pain relief. I am prescribing oxycodone/Tylenol 10-3 25 one to 2 tablets every 4-6 hours as needed for pain #30. Colace or MiraLAX recommended when using narcotics to avoid constipation. She should continue to avoid all alcohol and I recommended she stop smoking  2. Indigestion -- improved with pantoprazole. Continue 40 mg daily   3 month followup, sooner if needed  [id number] [address], [address], [place] [id number]

## 2015-04-18 LAB — CBC WITH DIFFERENTIAL/PLATELET
BASOS ABS: 0.1 10*3/uL (ref 0.0–0.1)
Basophils Relative: 1 % (ref 0.0–3.0)
Eosinophils Absolute: 0 10*3/uL (ref 0.0–0.7)
Eosinophils Relative: 0.6 % (ref 0.0–5.0)
HCT: 37.2 % (ref 36.0–46.0)
Hemoglobin: 12.3 g/dL (ref 12.0–15.0)
LYMPHS ABS: 2.8 10*3/uL (ref 0.7–4.0)
Lymphocytes Relative: 40.1 % (ref 12.0–46.0)
MCHC: 33.1 g/dL (ref 30.0–36.0)
MCV: 69.4 fl — ABNORMAL LOW (ref 78.0–100.0)
Monocytes Absolute: 0.6 10*3/uL (ref 0.1–1.0)
Monocytes Relative: 8.1 % (ref 3.0–12.0)
NEUTROS ABS: 3.6 10*3/uL (ref 1.4–7.7)
NEUTROS PCT: 50.2 % (ref 43.0–77.0)
Platelets: 561 10*3/uL — ABNORMAL HIGH (ref 150.0–400.0)
RBC: 5.36 Mil/uL — ABNORMAL HIGH (ref 3.87–5.11)
RDW: 14.5 % (ref 11.5–15.5)
WBC: 7.1 10*3/uL (ref 4.0–10.5)

## 2015-04-20 ENCOUNTER — Other Ambulatory Visit: Payer: Self-pay | Admitting: Internal Medicine

## 2015-04-20 DIAGNOSIS — Z8719 Personal history of other diseases of the digestive system: Secondary | ICD-10-CM

## 2015-04-20 DIAGNOSIS — R109 Unspecified abdominal pain: Secondary | ICD-10-CM

## 2015-04-20 DIAGNOSIS — D490 Neoplasm of unspecified behavior of digestive system: Secondary | ICD-10-CM

## 2015-04-20 DIAGNOSIS — R112 Nausea with vomiting, unspecified: Secondary | ICD-10-CM

## 2015-04-20 LAB — IGG SUBCLASSES
IGG SUBCLASS 2: 369 mg/dL (ref 241–700)
IGG SUBCLASS 3: 88 mg/dL (ref 22–178)
IgG Subclass 1: 598 mg/dL (ref 382–929)
IgG Subclass 4: 23.2 mg/dL (ref 4.0–86.0)

## 2015-04-22 ENCOUNTER — Ambulatory Visit (HOSPITAL_COMMUNITY)
Admission: RE | Admit: 2015-04-22 | Discharge: 2015-04-22 | Disposition: A | Discharge status: Home / Self Care | Payer: Medicaid Other | Source: Ambulatory Visit | Attending: Internal Medicine | Admitting: Internal Medicine

## 2015-04-22 ENCOUNTER — Ambulatory Visit (HOSPITAL_COMMUNITY): Payer: Medicaid Other

## 2015-04-22 ENCOUNTER — Other Ambulatory Visit: Payer: Self-pay | Admitting: Internal Medicine

## 2015-04-22 DIAGNOSIS — Z90411 Acquired partial absence of pancreas: Secondary | ICD-10-CM | POA: Diagnosis not present

## 2015-04-22 DIAGNOSIS — K859 Acute pancreatitis without necrosis or infection, unspecified: Secondary | ICD-10-CM | POA: Insufficient documentation

## 2015-04-22 DIAGNOSIS — Z8719 Personal history of other diseases of the digestive system: Secondary | ICD-10-CM

## 2015-04-22 DIAGNOSIS — R109 Unspecified abdominal pain: Secondary | ICD-10-CM | POA: Insufficient documentation

## 2015-04-22 DIAGNOSIS — D490 Neoplasm of unspecified behavior of digestive system: Secondary | ICD-10-CM | POA: Diagnosis present

## 2015-04-22 DIAGNOSIS — Z9081 Acquired absence of spleen: Secondary | ICD-10-CM | POA: Insufficient documentation

## 2015-04-22 DIAGNOSIS — R112 Nausea with vomiting, unspecified: Secondary | ICD-10-CM

## 2015-04-22 MED ORDER — GADOBENATE DIMEGLUMINE 529 MG/ML IV SOLN
15.0000 mL | Freq: Once | INTRAVENOUS | Status: AC | PRN
  Administered 2015-04-22: 15 mL via INTRAVENOUS

## 2015-05-29 ENCOUNTER — Other Ambulatory Visit (INDEPENDENT_AMBULATORY_CARE_PROVIDER_SITE_OTHER): Payer: Medicaid Other

## 2015-05-29 ENCOUNTER — Encounter: Payer: Self-pay | Admitting: Internal Medicine

## 2015-05-29 ENCOUNTER — Ambulatory Visit (INDEPENDENT_AMBULATORY_CARE_PROVIDER_SITE_OTHER): Payer: Medicaid Other | Admitting: Internal Medicine

## 2015-05-29 VITALS — BP 126/98 | HR 64 | Ht 60.0 in | Wt 167.0 lb

## 2015-05-29 DIAGNOSIS — R12 Heartburn: Secondary | ICD-10-CM

## 2015-05-29 DIAGNOSIS — G8929 Other chronic pain: Secondary | ICD-10-CM

## 2015-05-29 DIAGNOSIS — R1013 Epigastric pain: Secondary | ICD-10-CM

## 2015-05-29 DIAGNOSIS — K858 Other acute pancreatitis without necrosis or infection: Secondary | ICD-10-CM | POA: Diagnosis not present

## 2015-05-29 LAB — LIPASE: LIPASE: 5 U/L — AB (ref 11.0–59.0)

## 2015-05-29 LAB — HEPATIC FUNCTION PANEL
ALT: 13 U/L (ref 0–35)
AST: 14 U/L (ref 0–37)
Albumin: 4.4 g/dL (ref 3.5–5.2)
Alkaline Phosphatase: 57 U/L (ref 39–117)
Bilirubin, Direct: 0.1 mg/dL (ref 0.0–0.3)
TOTAL PROTEIN: 8 g/dL (ref 6.0–8.3)
Total Bilirubin: 0.4 mg/dL (ref 0.2–1.2)

## 2015-05-29 LAB — AMYLASE: Amylase: 27 U/L (ref 27–131)

## 2015-05-29 NOTE — Patient Instructions (Addendum)
We will have Dr Ardis Hughs office call to schedule you for an EUS. (done on Thrursdays)  Your physician has requested that you go to the basement for lab work before leaving today.

## 2015-05-29 NOTE — Progress Notes (Signed)
Subjective:    Patient ID: [name], female    DOB: 20-Apr-1985, 30 y.o.   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 She was initially seen on 04/17/2015 to evaluate epigastric abdominal pain radiating to the back similar to prior episodes of pancreatitis. After this visit she had MRI MRCP which showed evidence of distal pancreatectomy but was otherwise normal. There was no evidence of gallstones or choledocholithiasis. No evidence for pancreatic ductal abnormality or definite chronic pancreatitis. No new cystic neoplasms.  Fortunately she has continued to have intermittent epigastric abdominal pain radiating to the back. Last episode was 3 days ago which became so severe she thought about going to the ER. She reduces her diet to clear liquids and spent one day in bed. She did use oxycodone which helped with the abdominal pain. She has avoided regular diet since then out of fear. She is also now avoiding sodas and trying to eat a low-fat diet. She did not go to the ER because she says that she was "scared people think I am an addict". Her weight has been stable. Bowel movements a been regular. She continues Creon with meals. Stools do become loose during attacks. She does not drink alcohol. She continues pantoprazole daily which completely controls her indigestion and heartburn.  Review of Systems  as per history of present illness, otherwise negative  Current Medications, Allergies, Past Medical History, Past Surgical History, Family History and Social History were reviewed in [employer] record.     Objective:   Physical Exam BP 126/98 mmHg  Pulse 64  Ht 5' (1.524 m)  Wt 167 lb (75.751 kg)  BMI 32.62 kg/m2 Constitutional: Well-developed and well-nourished. No  distress. HEENT: Normocephalic and atraumatic. Oropharynx is clear and moist. No oropharyngeal exudate. Conjunctivae are normal.  No scleral icterus. Neck: Neck supple. Trachea midline. Cardiovascular: Normal rate, regular rhythm and intact distal pulses. No M/R/G Pulmonary/chest: Effort normal and breath sounds normal. No wheezing, rales or rhonchi. Abdominal: Soft, Epigastric tenderness without rebound or guarding, nondistended. Bowel sounds active throughout. There are no masses palpable. No hepatosplenomegaly. Extremities: no clubbing, cyanosis, or edema Neurological: Alert and oriented to person place and time. Skin: Skin is warm and dry. Psychiatric: Normal mood and affect. Behavior is normal.  Hepatic Function Latest Ref Rng 05/29/2015 04/17/2015 07/18/2014  Total Protein 6.0 - 8.3 g/dL 8.0 7.6 7.3  Albumin 3.5 - 5.2 g/dL 4.4 4.3 3.9  AST 0 - 37 U/L '14 15 17  '$ ALT 0 - 35 U/L '13 15 15  '$ Alk Phosphatase 39 - 117 U/L 57 50 71  Total Bilirubin 0.2 - 1.2 mg/dL 0.4 0.4 0.2(L)  Bilirubin, Direct 0.0 - 0.3 mg/dL 0.1 - -    Lipase     Component Value Date/Time   LIPASE 5.0* 05/29/2015 1449   IgG4 normal   MRI ABDOMEN WITHOUT AND WITH CONTRAST (INCLUDING MRCP)   TECHNIQUE: Multiplanar multisequence MR imaging of the abdomen was performed both before and after the administration of intravenous contrast. Heavily T2-weighted images of the biliary and pancreatic ducts were obtained, and three-dimensional MRCP images were rendered by post processing.   CONTRAST:  25m MULTIHANCE GADOBENATE DIMEGLUMINE 529 MG/ML IV SOLN   COMPARISON:  08/22/2012.   FINDINGS: Lower chest:  No pleural or pericardial effusion.   Hepatobiliary: No  suspicious liver abnormality. The gallbladder appears normal. The common bile duct is upper limits of normal in caliber measuring 6 mm. No choledocholithiasis or mass identified.   Pancreas: Distal pancreatectomy has been performed. The pancreatic duct has a  normal caliber. No mass. No fluid collections identified within the pancreatic bed.   Spleen: Previous splenectomy.   Adrenals/Urinary Tract: The adrenal glands are within normal limits. Normal appearance of the left kidney. There is a cyst arising from the upper pole of the right kidney, image number 56 of series 1101. Unremarkable appearance of the left kidney.   Stomach/Bowel: The stomach is normal. No pathologic dilatation of the visualized upper abdominal bowel loops.   Vascular/Lymphatic: Normal appearance of the abdominal aorta. No upper abdominal adenopathy.   Other: No free fluid or fluid collections identified within the upper abdomen.   Musculoskeletal: Normal signal from within the bone to marrow.   IMPRESSION: 1. No acute findings identified within the abdomen. 2. No explanation for patient's pancreatitis. No gallstones or choledocholithiasis. 3. Status post distal pancreatectomy and splenectomy.     Electronically Signed   By: [name]   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  1. History of cystic pancreatic neoplasm status post distal pancreatectomy and splenectomy/episodic severe abdominal pain -- her symptoms seem consistent to me with pancreatic type pain despite a normal MRI/MRCP. Interestingly, when I reviewed the EUS performed by Dr. Jerl Santos on 08/13/2010 at [facility], he did remark that there were changes "suggestive of chronic pancreatitis in the body and tail".  I have discussed the case with Dr. Ardis Hughs today and after this discussion we decided to pursue repeat EUS to further evaluate the pancreas to determine if any chronic pancreatitis changes exist. If so this may explain her attacks of pain. This will also rule out recurrent cystic neoplasm that may  be subtle by MR. At the time of EUS, he will examine the stomach and proximal small bowel to rule out ulcer disease. She will continue Creon 2 tablets with meals. She will continue oxycodone 10 mg 1-2 tablets on an as-needed basis only when she develops episodic pain. Repeat liver enzymes and lipase was normal today. Standing order for liver enzymes and lipase and she is instructed to return as soon as possible after her next attack of abdominal pain to see if her liver enzymes or pancreatic enzyme bumps. She did not request a refill of narcotics today. She can get a refill when needed.  2. Indigestion/heartburn -- continue pantoprazole 40 mg daily for now  3-4 month office follow-up, sooner if necessary Appreciate Dr. Ardis Hughs' assistance

## 2015-06-01 ENCOUNTER — Other Ambulatory Visit: Payer: Self-pay

## 2015-06-01 ENCOUNTER — Telehealth: Payer: Self-pay

## 2015-06-01 DIAGNOSIS — R109 Unspecified abdominal pain: Secondary | ICD-10-CM

## 2015-06-01 DIAGNOSIS — K861 Other chronic pancreatitis: Secondary | ICD-10-CM

## 2015-06-01 NOTE — Progress Notes (Signed)
Ulice Dash, we'll get this scheduled.  Thanks   Patty, she needs upper EUS, radial +/- linear, +MAC, next available EUS Thursday.  For abd pains, ? Chronic pancreatitis.  thanks

## 2015-06-01 NOTE — Telephone Encounter (Signed)
-----   Message from Milus Banister, MD sent at 06/01/2015  7:14 AM EDT -----   ----- Message -----    From: Jerene Bears, MD    Sent: 05/29/2015   5:21 PM      To: Milus Banister, MD  Pt we discussed re: EUS Interestingly, Jerl Santos' impression at EUS in 2012 suggested chronic pancreatitis changes in the body and tail. Tail subsequently resected

## 2015-06-01 NOTE — Telephone Encounter (Signed)
[name], we'll get this scheduled. Thanks   Tavarius Grewe, she needs upper EUS, radial +/- linear, +MAC, next available EUS Thursday. For abd pains, ? Chronic pancreatitis. thanks

## 2015-06-01 NOTE — Telephone Encounter (Signed)
EUS scheduled, pt instructed and medications reviewed.  Patient instructions mailed to home.  Patient to call with any questions or concerns.  

## 2015-06-02 ENCOUNTER — Encounter (HOSPITAL_COMMUNITY): Payer: Self-pay | Admitting: *Deleted

## 2015-06-02 ENCOUNTER — Telehealth: Payer: Self-pay | Admitting: *Deleted

## 2015-06-02 ENCOUNTER — Other Ambulatory Visit (INDEPENDENT_AMBULATORY_CARE_PROVIDER_SITE_OTHER): Payer: Medicaid Other

## 2015-06-02 DIAGNOSIS — R1013 Epigastric pain: Secondary | ICD-10-CM

## 2015-06-02 DIAGNOSIS — K858 Other acute pancreatitis without necrosis or infection: Secondary | ICD-10-CM

## 2015-06-02 DIAGNOSIS — G8929 Other chronic pain: Secondary | ICD-10-CM

## 2015-06-02 LAB — HEPATIC FUNCTION PANEL
ALBUMIN: 4.3 g/dL (ref 3.5–5.2)
ALT: 13 U/L (ref 0–35)
AST: 15 U/L (ref 0–37)
Alkaline Phosphatase: 63 U/L (ref 39–117)
BILIRUBIN TOTAL: 0.4 mg/dL (ref 0.2–1.2)
Bilirubin, Direct: 0.1 mg/dL (ref 0.0–0.3)
TOTAL PROTEIN: 7.7 g/dL (ref 6.0–8.3)

## 2015-06-02 LAB — LIPASE: Lipase: 7 U/L — ABNORMAL LOW (ref 11.0–59.0)

## 2015-06-02 LAB — AMYLASE: AMYLASE: 27 U/L (ref 27–131)

## 2015-06-02 MED ORDER — OXYCODONE HCL 10 MG PO TABS: ORAL_TABLET | ORAL | Status: DC

## 2015-06-02 NOTE — Telephone Encounter (Signed)
Rx placed at front desk for patient pick up. Also see lab note dated 06/02/15.

## 2015-06-02 NOTE — Telephone Encounter (Signed)
Yes per my last conversation with her and my last office note, the narcotic pain medication can be refilled

## 2015-06-02 NOTE — Telephone Encounter (Signed)
Patient came in office today stating that Dr Hilarie Fredrickson told her that if she had an acute attack of abdominal pain then she needed to come for labs. Patient states that she starting having epigastric pain raidiating to the back around 5:30 pm yesterday.  She indicates that she had some vomiting this morning and took a pain pill so she could come here. Requests additional oxycodone rx. Last rx that we gave was 04/17/15 for #30. May I refill?

## 2015-06-08 ENCOUNTER — Telehealth: Payer: Self-pay | Admitting: Internal Medicine

## 2015-06-08 NOTE — Telephone Encounter (Signed)
Patient states that her fiancee will be bringing her for her endoscopic ultrasound on 06/11/15 and he needs a note for work indicating this. She asks if he can also have one for 06/12/15 so he can stay home and help her with the children. States last time she had an EUS she was unable to care for her children the following day and she anticipates that this time as well. Per Dr Vena Rua verbal orders, patient's fiancee may have a work note for 06/11/15 and 06/12/15. Patient's fiancee is Bonnee Quin, DOB 06/06/1982. Letter created and awaiting pick up at front desk.

## 2015-06-11 ENCOUNTER — Ambulatory Visit (HOSPITAL_COMMUNITY)
Admission: RE | Admit: 2015-06-11 | Discharge: 2015-06-11 | Disposition: A | Discharge status: Home / Self Care | Payer: Medicaid Other | Source: Ambulatory Visit | Attending: Gastroenterology | Admitting: Gastroenterology

## 2015-06-11 ENCOUNTER — Encounter (HOSPITAL_COMMUNITY): Payer: Self-pay

## 2015-06-11 ENCOUNTER — Ambulatory Visit (HOSPITAL_COMMUNITY): Payer: Medicaid Other | Admitting: Anesthesiology

## 2015-06-11 ENCOUNTER — Encounter (HOSPITAL_COMMUNITY)
Admission: RE | Disposition: A | Discharge status: Home / Self Care | Payer: Self-pay | Source: Ambulatory Visit | Attending: Gastroenterology

## 2015-06-11 DIAGNOSIS — K861 Other chronic pancreatitis: Secondary | ICD-10-CM

## 2015-06-11 DIAGNOSIS — Z7984 Long term (current) use of oral hypoglycemic drugs: Secondary | ICD-10-CM | POA: Diagnosis not present

## 2015-06-11 DIAGNOSIS — E118 Type 2 diabetes mellitus with unspecified complications: Secondary | ICD-10-CM | POA: Insufficient documentation

## 2015-06-11 DIAGNOSIS — Z90411 Acquired partial absence of pancreas: Secondary | ICD-10-CM | POA: Diagnosis not present

## 2015-06-11 DIAGNOSIS — Z8507 Personal history of malignant neoplasm of pancreas: Secondary | ICD-10-CM | POA: Insufficient documentation

## 2015-06-11 DIAGNOSIS — Z79899 Other long term (current) drug therapy: Secondary | ICD-10-CM | POA: Insufficient documentation

## 2015-06-11 DIAGNOSIS — R1013 Epigastric pain: Secondary | ICD-10-CM | POA: Insufficient documentation

## 2015-06-11 DIAGNOSIS — N289 Disorder of kidney and ureter, unspecified: Secondary | ICD-10-CM | POA: Diagnosis not present

## 2015-06-11 DIAGNOSIS — Z9081 Acquired absence of spleen: Secondary | ICD-10-CM | POA: Insufficient documentation

## 2015-06-11 DIAGNOSIS — I10 Essential (primary) hypertension: Secondary | ICD-10-CM | POA: Diagnosis not present

## 2015-06-11 DIAGNOSIS — R12 Heartburn: Secondary | ICD-10-CM | POA: Insufficient documentation

## 2015-06-11 DIAGNOSIS — Q793 Gastroschisis: Secondary | ICD-10-CM | POA: Insufficient documentation

## 2015-06-11 DIAGNOSIS — F17210 Nicotine dependence, cigarettes, uncomplicated: Secondary | ICD-10-CM | POA: Diagnosis not present

## 2015-06-11 DIAGNOSIS — R109 Unspecified abdominal pain: Secondary | ICD-10-CM

## 2015-06-11 HISTORY — PX: EUS: SHX5427

## 2015-06-11 LAB — GLUCOSE, CAPILLARY: GLUCOSE-CAPILLARY: 110 mg/dL — AB (ref 65–99)

## 2015-06-11 SURGERY — UPPER ENDOSCOPIC ULTRASOUND (EUS) RADIAL
Anesthesia: Monitor Anesthesia Care

## 2015-06-11 MED ORDER — LIDOCAINE HCL (CARDIAC) 20 MG/ML IV SOLN
INTRAVENOUS | Status: AC
  Filled 2015-06-11: qty 5

## 2015-06-11 MED ORDER — PROPOFOL 10 MG/ML IV BOLUS
INTRAVENOUS | Status: DC | PRN
  Administered 2015-06-11: 120 mg via INTRAVENOUS
  Administered 2015-06-11: 60 mg via INTRAVENOUS

## 2015-06-11 MED ORDER — LACTATED RINGERS IV SOLN
INTRAVENOUS | Status: DC
  Administered 2015-06-11: 12:00:00 via INTRAVENOUS

## 2015-06-11 MED ORDER — BUTAMBEN-TETRACAINE-BENZOCAINE 2-2-14 % EX AERO
INHALATION_SPRAY | CUTANEOUS | Status: DC | PRN
  Administered 2015-06-11: 2 via TOPICAL

## 2015-06-11 MED ORDER — PROPOFOL 10 MG/ML IV BOLUS
INTRAVENOUS | Status: AC
  Filled 2015-06-11: qty 20

## 2015-06-11 MED ORDER — SODIUM CHLORIDE 0.9 % IV SOLN: INTRAVENOUS | Status: DC

## 2015-06-11 MED ORDER — LIDOCAINE HCL (CARDIAC) 20 MG/ML IV SOLN
INTRAVENOUS | Status: DC | PRN
  Administered 2015-06-11: 80 mg via INTRAVENOUS
  Administered 2015-06-11: 20 mg via INTRAVENOUS

## 2015-06-11 MED ORDER — PROPOFOL 500 MG/50ML IV EMUL
INTRAVENOUS | Status: DC | PRN
  Administered 2015-06-11: 200 ug/kg/min via INTRAVENOUS

## 2015-06-11 NOTE — Transfer of Care (Signed)
Immediate Anesthesia Transfer of Care Note  Patient: [name]  Procedure(s) Performed: Procedure(s): UPPER ENDOSCOPIC ULTRASOUND (EUS) RADIAL (N/A)  Patient Location: PACU  Anesthesia Type:MAC  Level of Consciousness:  sedated, patient cooperative and responds to stimulation  Airway & Oxygen Therapy:Patient Spontanous Breathing and Patient connected to nasal oxgen  Post-op Assessment:  Report given to PACU RN and Post -op Vital signs reviewed and stable  Post vital signs:  Reviewed and stable  Last Vitals:  Filed Vitals:   06/11/15 1136  BP: 128/89  Pulse: 71  Temp: 36.9 C  Resp: 12    Complications: No apparent anesthesia complications

## 2015-06-11 NOTE — Anesthesia Preprocedure Evaluation (Addendum)
Anesthesia Evaluation  Patient identified by MRN, date of birth, ID band Patient awake    Reviewed: Allergy & Precautions, NPO status , Patient's Chart, lab work & pertinent test results  Airway Mallampati: I  TM Distance: >3 FB Neck ROM: Full    Dental   Pulmonary asthma , Current Smoker,    breath sounds clear to auscultation       Cardiovascular hypertension, negative cardio ROS   Rhythm:Regular Rate:Normal     Neuro/Psych negative neurological ROS     GI/Hepatic negative GI ROS, Neg liver ROS,   Endo/Other  diabetes, Type 2, Oral Hypoglycemic Agents  Renal/GU Renal disease     Musculoskeletal   Abdominal   Peds  Hematology negative hematology ROS (+)   Anesthesia Other Findings   Reproductive/Obstetrics                            Anesthesia Physical Anesthesia Plan  ASA: II  Anesthesia Plan: MAC   Post-op Pain Management:    Induction: Intravenous  Airway Management Planned: Natural Airway and Nasal Cannula  Additional Equipment:   Intra-op Plan:   Post-operative Plan:   Informed Consent: I have reviewed the patients History and Physical, chart, labs and discussed the procedure including the risks, benefits and alternatives for the proposed anesthesia with the patient or authorized representative who has indicated his/her understanding and acceptance.     Plan Discussed with: CRNA  Anesthesia Plan Comments:         Anesthesia Quick Evaluation

## 2015-06-11 NOTE — H&P (View-Only) (Signed)
Subjective:    Patient ID: [name], female    DOB: 1985/09/22, 30 y.o.   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 She was initially seen on 04/17/2015 to evaluate epigastric abdominal pain radiating to the back similar to prior episodes of pancreatitis. After this visit she had MRI MRCP which showed evidence of distal pancreatectomy but was otherwise normal. There was no evidence of gallstones or choledocholithiasis. No evidence for pancreatic ductal abnormality or definite chronic pancreatitis. No new cystic neoplasms.  Fortunately she has continued to have intermittent epigastric abdominal pain radiating to the back. Last episode was 3 days ago which became so severe she thought about going to the ER. She reduces her diet to clear liquids and spent one day in bed. She did use oxycodone which helped with the abdominal pain. She has avoided regular diet since then out of fear. She is also now avoiding sodas and trying to eat a low-fat diet. She did not go to the ER because she says that she was "scared people think I am an addict". Her weight has been stable. Bowel movements a been regular. She continues Creon with meals. Stools do become loose during attacks. She does not drink alcohol. She continues pantoprazole daily which completely controls her indigestion and heartburn.  Review of Systems  as per history of present illness, otherwise negative  Current Medications, Allergies, Past Medical History, Past Surgical History, Family History and Social History were reviewed in [employer] record.     Objective:   Physical Exam BP 126/98 mmHg  Pulse 64  Ht 5' (1.524 m)  Wt 167 lb (75.751 kg)  BMI 32.62 kg/m2 Constitutional: Well-developed and well-nourished. No  distress. HEENT: Normocephalic and atraumatic. Oropharynx is clear and moist. No oropharyngeal exudate. Conjunctivae are normal.  No scleral icterus. Neck: Neck supple. Trachea midline. Cardiovascular: Normal rate, regular rhythm and intact distal pulses. No M/R/G Pulmonary/chest: Effort normal and breath sounds normal. No wheezing, rales or rhonchi. Abdominal: Soft, Epigastric tenderness without rebound or guarding, nondistended. Bowel sounds active throughout. There are no masses palpable. No hepatosplenomegaly. Extremities: no clubbing, cyanosis, or edema Neurological: Alert and oriented to person place and time. Skin: Skin is warm and dry. Psychiatric: Normal mood and affect. Behavior is normal.  Hepatic Function Latest Ref Rng 05/29/2015 04/17/2015 07/18/2014  Total Protein 6.0 - 8.3 g/dL 8.0 7.6 7.3  Albumin 3.5 - 5.2 g/dL 4.4 4.3 3.9  AST 0 - 37 U/L '14 15 17  '$ ALT 0 - 35 U/L '13 15 15  '$ Alk Phosphatase 39 - 117 U/L 57 50 71  Total Bilirubin 0.2 - 1.2 mg/dL 0.4 0.4 0.2(L)  Bilirubin, Direct 0.0 - 0.3 mg/dL 0.1 - -    Lipase     Component Value Date/Time   LIPASE 5.0* 05/29/2015 1449   IgG4 normal   MRI ABDOMEN WITHOUT AND WITH CONTRAST (INCLUDING MRCP)   TECHNIQUE: Multiplanar multisequence MR imaging of the abdomen was performed both before and after the administration of intravenous contrast. Heavily T2-weighted images of the biliary and pancreatic ducts were obtained, and three-dimensional MRCP images were rendered by post processing.   CONTRAST:  51m MULTIHANCE GADOBENATE DIMEGLUMINE 529 MG/ML IV SOLN   COMPARISON:  08/22/2012.   FINDINGS: Lower chest:  No pleural or pericardial effusion.   Hepatobiliary: No  suspicious liver abnormality. The gallbladder appears normal. The common bile duct is upper limits of normal in caliber measuring 6 mm. No choledocholithiasis or mass identified.   Pancreas: Distal pancreatectomy has been performed. The pancreatic duct has a  normal caliber. No mass. No fluid collections identified within the pancreatic bed.   Spleen: Previous splenectomy.   Adrenals/Urinary Tract: The adrenal glands are within normal limits. Normal appearance of the left kidney. There is a cyst arising from the upper pole of the right kidney, image number 56 of series 1101. Unremarkable appearance of the left kidney.   Stomach/Bowel: The stomach is normal. No pathologic dilatation of the visualized upper abdominal bowel loops.   Vascular/Lymphatic: Normal appearance of the abdominal aorta. No upper abdominal adenopathy.   Other: No free fluid or fluid collections identified within the upper abdomen.   Musculoskeletal: Normal signal from within the bone to marrow.   IMPRESSION: 1. No acute findings identified within the abdomen. 2. No explanation for patient's pancreatitis. No gallstones or choledocholithiasis. 3. Status post distal pancreatectomy and splenectomy.     Electronically Signed   By: [name]   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  1. History of cystic pancreatic neoplasm status post distal pancreatectomy and splenectomy/episodic severe abdominal pain -- her symptoms seem consistent to me with pancreatic type pain despite a normal MRI/MRCP. Interestingly, when I reviewed the EUS performed by Dr. Jerl Santos on 08/13/2010 at [facility], he did remark that there were changes "suggestive of chronic pancreatitis in the body and tail".  I have discussed the case with Dr. Ardis Hughs today and after this discussion we decided to pursue repeat EUS to further evaluate the pancreas to determine if any chronic pancreatitis changes exist. If so this may explain her attacks of pain. This will also rule out recurrent cystic neoplasm that may  be subtle by MR. At the time of EUS, he will examine the stomach and proximal small bowel to rule out ulcer disease. She will continue Creon 2 tablets with meals. She will continue oxycodone 10 mg 1-2 tablets on an as-needed basis only when she develops episodic pain. Repeat liver enzymes and lipase was normal today. Standing order for liver enzymes and lipase and she is instructed to return as soon as possible after her next attack of abdominal pain to see if her liver enzymes or pancreatic enzyme bumps. She did not request a refill of narcotics today. She can get a refill when needed.  2. Indigestion/heartburn -- continue pantoprazole 40 mg daily for now  3-4 month office follow-up, sooner if necessary Appreciate Dr. Ardis Hughs' assistance

## 2015-06-11 NOTE — Op Note (Signed)
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 Estimated blood loss:                            None. Estimated Blood Loss:     Estimated blood loss: none. Procedure:                Pre-Anesthesia Assessment:                           - Prior to the procedure, a History and Physical                            was performed, and patient medications and                            allergies were reviewed. The patient's tolerance of                            previous anesthesia was also reviewed. The risks                            and benefits of the procedure and the sedation                            options and risks were discussed with the patient.                            All questions were answered, and informed consent                            was obtained. Prior Anticoagulants: The patient has                            taken no previous anticoagulant or antiplatelet                            agents. ASA Grade Assessment: II - A patient with  mild systemic disease. After reviewing the risks                            and benefits, the patient was deemed in                            satisfactory condition to undergo the procedure.                           After obtaining informed consent, the endoscope was                            passed under direct vision. Throughout the                            procedure, the patient's blood  pressure, pulse, and                            oxygen saturations were monitored continuously. The                            [id number]) scope was introduced through                            the mouth, and advanced to the duodenal bulb. The                            upper EUS was accomplished without difficulty. The                            patient tolerated the procedure well. Scope In: Scope Out: Findings:      Endoscopic Findings: :      The UGI tract was essentially normal      Endosonographic Finding :      Endosonographic Finding      1. Pancreatic parenchyma in the head was slightly lobular and contained       a few echogenic strands but no convincing signs of chronic pancreatitis.       The remaining pancreas (neck, body, remaining tail s/p 5 years go       surgery) was all normal.      2. Main pancreatic duct was normal.      3. CBD was normal, non-dilated.      4. Gallbladder was normal.      5. Limited views of liver, portal and splenic vessels were all normal. Impression:               - Normal [Location].                           - There was no sign of significant pathology.                           - No specimens collected. Moderate Sedation:      N/A- Per Anesthesia Care Recommendation:           - Patient has a contact number available  for                            emergencies. The signs and symptoms of potential                            delayed complications were discussed with the                            patient. Return to normal activities tomorrow.                            Written discharge instructions were provided to the                            patient.                           - Resume previous diet. 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 All rights reserved. The codes documented in this report are preliminary and upon coder review may  be revised to meet current compliance requirements. Milus Banister, MD 06/11/2015 12:22:51 PM This report has been signed electronically. Number of Addenda: 0

## 2015-06-11 NOTE — Interval H&P Note (Signed)
History and Physical Interval Note:  06/11/2015 11:18 AM  [name]  has presented today for surgery, with the diagnosis of chronic pancreatitis, abd pain/pl   The various methods of treatment have been discussed with the patient and family. After consideration of risks, benefits and other options for treatment, the patient has consented to  Procedure(s): UPPER ENDOSCOPIC ULTRASOUND (EUS) RADIAL (N/A) as a surgical intervention .  The patient's history has been reviewed, patient examined, no change in status, stable for surgery.  I have reviewed the patient's chart and labs.  Questions were answered to the patient's satisfaction.     Milus Banister

## 2015-06-11 NOTE — Discharge Instructions (Signed)

## 2015-06-12 ENCOUNTER — Encounter (HOSPITAL_COMMUNITY): Payer: Self-pay | Admitting: Gastroenterology

## 2015-06-13 NOTE — Anesthesia Postprocedure Evaluation (Signed)
Anesthesia Post Note  Patient: [name]  Procedure(s) Performed: Procedure(s) (LRB): UPPER ENDOSCOPIC ULTRASOUND (EUS) RADIAL (N/A)  Patient location during evaluation: PACU Anesthesia Type: MAC Level of consciousness: awake and alert Pain management: pain level controlled Vital Signs Assessment: post-procedure vital signs reviewed and stable Respiratory status: spontaneous breathing, nonlabored ventilation, respiratory function stable and patient connected to nasal cannula oxygen Cardiovascular status: stable and blood pressure returned to baseline Anesthetic complications: no    Last Vitals:  Filed Vitals:   06/11/15 1240 06/11/15 1250  BP: 129/99 124/93  Pulse: 65 68  Temp:    Resp: 11 8    Last Pain: There were no vitals filed for this visit.               Tiajuana Amass

## 2015-06-26 ENCOUNTER — Telehealth: Payer: Self-pay | Admitting: Gastroenterology

## 2015-06-26 NOTE — Telephone Encounter (Signed)
Spoke with pt and she is aware. Pt would like to be referred to Dr. Amalia Hailey at [facility].

## 2015-06-26 NOTE — Telephone Encounter (Signed)
Pt wants to know why she keeps having pain off and on. States she has cut out most of the fat in her diet but she wants to know what causes her pancreatitis. States she has pain off and on that usually starts in her midback. States when this happens she goes to a clear liquid diet for several days and it usually gets better. Pt states she does not want to have to take pain meds, she wants to know what to do to prevent the symptoms. Please advise.

## 2015-06-26 NOTE — Telephone Encounter (Signed)
I really am uncertain as to why she continues to have pancreatitis type pain despite the normal imaging performed recently both with MRI and EUS. Chronic pancreatitis changes seen previously with Dr. Amalia Hailey at [facility] but not seen recently by MRI and EUS hearing [facility] I would recommend that she see another pancreatic specialist either Dr. Amalia Hailey at [facility] or Dr. Harl Bowie at [facility] for another opinion

## 2015-06-26 NOTE — Telephone Encounter (Signed)
Pt would like to speak with Dr Vena Rua nurse regarding future treatment and recommendations.  She has questions about why she has pancreatitis.

## 2015-06-29 NOTE — Telephone Encounter (Signed)
Pt scheduled for appointment at [facility] 10/05/15@3pm , pt to arrive there at 2:45pm and bring insurance card and med list with her. Pt to receive packet in the mail regarding appt. Pt will be seen by one of the Fellows at the clinic, Dr. Amalia Hailey does not see pts with Medicaid. Pt aware of appt.

## 2015-09-22 ENCOUNTER — Ambulatory Visit (HOSPITAL_COMMUNITY)
Admission: EM | Admit: 2015-09-22 | Discharge: 2015-09-22 | Disposition: A | Discharge status: Home / Self Care | Payer: Medicaid Other | Attending: Family Medicine | Admitting: Family Medicine

## 2015-09-22 ENCOUNTER — Encounter (HOSPITAL_COMMUNITY): Payer: Self-pay | Admitting: *Deleted

## 2015-09-22 DIAGNOSIS — J029 Acute pharyngitis, unspecified: Secondary | ICD-10-CM | POA: Diagnosis not present

## 2015-09-22 DIAGNOSIS — J40 Bronchitis, not specified as acute or chronic: Secondary | ICD-10-CM

## 2015-09-22 DIAGNOSIS — R070 Pain in throat: Secondary | ICD-10-CM | POA: Diagnosis not present

## 2015-09-22 MED ORDER — AMOXICILLIN 875 MG PO TABS: 875.0000 mg | ORAL_TABLET | Freq: Two times a day (BID) | ORAL | Status: DC

## 2015-09-22 MED ORDER — BENZONATATE 100 MG PO CAPS: 200.0000 mg | ORAL_CAPSULE | Freq: Three times a day (TID) | ORAL | Status: DC | PRN

## 2015-09-22 MED ORDER — METHYLPREDNISOLONE 4 MG PO TBPK: ORAL_TABLET | ORAL | Status: DC

## 2015-09-22 MED ORDER — LIDOCAINE VISCOUS 2 % MT SOLN: 20.0000 mL | OROMUCOSAL | Status: DC | PRN

## 2015-09-22 MED ORDER — LIDOCAINE VISCOUS 2 % MT SOLN
20.0000 mL | OROMUCOSAL | Status: DC | PRN
Start: 2015-09-22 — End: 2015-09-28

## 2015-09-22 NOTE — ED Notes (Signed)
Pt  Reports       Symptoms       Of  sorethroat        Cough   Congested        bilaterearal  Earache   With   Symptoms  For    About  1  Week   Pt     Sitting  Upright on  The  Exam  Table   Speaking  In  Complete   sentances

## 2015-09-22 NOTE — Discharge Instructions (Signed)
How to Use an Inhaler  Using your inhaler correctly is very important. Good technique will make sure that the medicine reaches your lungs.   HOW TO USE AN INHALER:  1. Take the cap off the inhaler.  2. If this is the first time using your inhaler, you need to prime it. Shake the inhaler for 5 seconds. Release four puffs into the air, away from your face. Ask your doctor for help if you have questions.  3. Shake the inhaler for 5 seconds.  4. Turn the inhaler so the bottle is above the mouthpiece.  5. Put your pointer finger on top of the bottle. Your thumb holds the bottom of the inhaler.  6. Open your mouth.  7. Either hold the inhaler away from your mouth (the width of 2 fingers) or place your lips tightly around the mouthpiece. Ask your doctor which way to use your inhaler.  8. Breathe out as much air as possible.  9. Breathe in and push down on the bottle 1 time to release the medicine. You will feel the medicine go in your mouth and throat.  10. Continue to take a deep breath in very slowly. Try to fill your lungs.  11. After you have breathed in completely, hold your breath for 10 seconds. This will help the medicine to settle in your lungs. If you cannot hold your breath for 10 seconds, hold it for as long as you can before you breathe out.  12. Breathe out slowly, through pursed lips. Whistling is an example of pursed lips.  13. If your doctor has told you to take more than 1 puff, wait at least 15-30 seconds between puffs. This will help you get the best results from your medicine. Do not use the inhaler more than your doctor tells you to.  14. Put the cap back on the inhaler.  15. Follow the directions from your doctor or from the inhaler package about cleaning the inhaler.  If you use more than one inhaler, ask your doctor which inhalers to use and what order to use them in. Ask your doctor to help you figure out when you will need to refill your inhaler.   If you use a steroid inhaler, always rinse your  mouth with water after your last puff, gargle and spit out the water. Do not swallow the water.  GET HELP IF:  · The inhaler medicine only partially helps to stop wheezing or shortness of breath.  · You are having trouble using your inhaler.  · You have some increase in thick spit (phlegm).  GET HELP RIGHT AWAY IF:  · The inhaler medicine does not help your wheezing or shortness of breath or you have tightness in your chest.  · You have dizziness, headaches, or fast heart rate.  · You have chills, fever, or night sweats.  · You have a large increase of thick spit, or your thick spit is bloody.  MAKE SURE YOU:   · Understand these instructions.  · Will watch your condition.  · Will get help right away if you are not doing well or get worse.     This information is not intended to replace advice given to you by your health care provider. Make sure you discuss any questions you have with your health care provider.     Document Released: 12/01/2007 Document Revised: 12/12/2012 Document Reviewed: 09/20/2012  Elsevier Interactive Patient Education ©2016 Elsevier Inc[employer]

## 2015-09-22 NOTE — ED Provider Notes (Signed)
CSN: [id number]     Arrival date & time 09/22/15  1057 History   First MD Initiated Contact with Patient 09/22/15 1253     Chief Complaint  Patient presents with  . Sore Throat   (Consider location/radiation/quality/duration/timing/severity/associated sxs/prior Treatment) Patient is a 30 y.o. female presenting with pharyngitis. The history is provided by the patient.  Sore Throat This is a new problem. The current episode started more than 1 week ago. The problem occurs constantly. The problem has not changed since onset.Associated symptoms include shortness of breath. The symptoms are aggravated by swallowing and drinking. Nothing relieves the symptoms. She has tried nothing for the symptoms.    Past Medical History  Diagnosis Date  . Pelvic inflammatory disease   . Pancreatitis     diet controlled  . Polycystic ovary syndrome   . Pancreatic cyst   . Asthma     rarely uses inhaler  . Missed ab     x 2, one requiring D & E  . Diabetes mellitus without complication ([place])     partial pancreatectomy 2012-glyburide  . Bronchitis   . Bipolar disorder ([place])     HX PPD after 2014 delivery, no meds  . Anemia   . Hypertension   . Alcohol abuse   . Renal cyst   . Plantar fasciitis    Past Surgical History  Procedure Laterality Date  . Splenectomy, total  2012  . Pancreas surgery  2012    s/p partial pancreatectomy  . Cesarean section N/A 05/23/2012    Procedure: CESAREAN SECTION;  Surgeon: Lahoma Crocker, MD;  Location: [facility];  Service: Obstetrics;  Laterality: N/A;  primary  . Dilation and curettage of uterus       for MAB  . Wisdom tooth extraction    . Cesarean section N/A 04/18/2014    Procedure: REPEAT CESAREAN SECTION;  Surgeon: Shelly Bombard, MD;  Location: [facility];  Service: Obstetrics;  Laterality: N/A;  . Eus N/A 06/11/2015    Procedure: UPPER ENDOSCOPIC ULTRASOUND (EUS) RADIAL;  Surgeon: Milus Banister, MD;  Location: [facility];  Service: Endoscopy;   Laterality: N/A;   Family History  Problem Relation Age of Onset  . Diabetes Mother   . Hypertension Mother   . Colon cancer Neg Hx   . Asthma Father    Social History  Substance Use Topics  . Smoking status: Current Every Day Smoker -- 13 years    Types: Cigarettes  . Smokeless tobacco: Never Used     Comment: smokes 4 cigs/day, tobacco info given 04/17/15  . Alcohol Use: No     Comment: social    OB History    Gravida Para Term Preterm AB TAB SAB Ectopic Multiple Living   5 2 2  3 1 2   0 2     Review of Systems  Constitutional: Positive for fatigue.  HENT: Positive for sore throat.   Eyes: Negative.   Respiratory: Positive for shortness of breath.   Cardiovascular: Negative.   Gastrointestinal: Negative.   Endocrine: Negative.   Genitourinary: Negative.   Musculoskeletal: Negative.   Allergic/Immunologic: Negative.   Neurological: Negative.   Hematological: Negative.   Psychiatric/Behavioral: Negative.     Allergies  Dilaudid  Home Medications   Prior to Admission medications   Not on File   Meds Ordered and Administered this Visit  Medications - No data to display  There were no vitals taken for this visit. No data found.   Physical Exam  Constitutional: She is oriented to person, place, and time. She appears well-developed and well-nourished.  HENT:  Head: Normocephalic and atraumatic.  Right Ear: External ear normal.  Left Ear: External ear normal.  OPX injected and tonsils erythematous 2 plus with exudates.  Eyes: Conjunctivae are normal. Pupils are equal, round, and reactive to light.  Neck: Normal range of motion. Neck supple.  Cardiovascular: Normal rate, regular rhythm and normal heart sounds.   Pulmonary/Chest: Effort normal. She has wheezes.  Abdominal: Soft. Bowel sounds are normal.  Neurological: She is alert and oriented to person, place, and time.  Skin: Skin is warm and dry.    ED Course  Procedures (including critical care  time)  Labs Review Labs Reviewed - No data to display  Imaging Review No results found.   Visual Acuity Review  Right Eye Distance:   Left Eye Distance:   Bilateral Distance:    Right Eye Near:   Left Eye Near:    Bilateral Near:         MDM  Bronchitis Pharyngitis Sore throat  Amoxicillin 875mg  one po bid x 10 days Tessalon Perles 200mg  one po  Medrol dose pack 4mg  #21 Viscous lidocaine 2% 56ml q3hour prn #127ml Push po fluids, rest, tylenol and motrin otc prn as directed for fever, arthralgias, and myalgias.  Follow up prn if sx's continue or persist.   Lysbeth Penner, FNP 09/22/15 1322

## 2015-09-25 ENCOUNTER — Emergency Department (HOSPITAL_COMMUNITY): Payer: Medicaid Other

## 2015-09-25 ENCOUNTER — Encounter (HOSPITAL_COMMUNITY): Payer: Self-pay | Admitting: Emergency Medicine

## 2015-09-25 ENCOUNTER — Emergency Department (HOSPITAL_COMMUNITY)
Admission: EM | Admit: 2015-09-25 | Discharge: 2015-09-25 | Disposition: A | Discharge status: Home / Self Care | Payer: Medicaid Other | Attending: Emergency Medicine | Admitting: Emergency Medicine

## 2015-09-25 DIAGNOSIS — Z791 Long term (current) use of non-steroidal anti-inflammatories (NSAID): Secondary | ICD-10-CM | POA: Diagnosis not present

## 2015-09-25 DIAGNOSIS — Z7984 Long term (current) use of oral hypoglycemic drugs: Secondary | ICD-10-CM | POA: Diagnosis not present

## 2015-09-25 DIAGNOSIS — F319 Bipolar disorder, unspecified: Secondary | ICD-10-CM | POA: Diagnosis not present

## 2015-09-25 DIAGNOSIS — J029 Acute pharyngitis, unspecified: Secondary | ICD-10-CM | POA: Diagnosis not present

## 2015-09-25 DIAGNOSIS — I10 Essential (primary) hypertension: Secondary | ICD-10-CM | POA: Insufficient documentation

## 2015-09-25 DIAGNOSIS — E119 Type 2 diabetes mellitus without complications: Secondary | ICD-10-CM | POA: Insufficient documentation

## 2015-09-25 DIAGNOSIS — Z792 Long term (current) use of antibiotics: Secondary | ICD-10-CM | POA: Insufficient documentation

## 2015-09-25 DIAGNOSIS — Z79899 Other long term (current) drug therapy: Secondary | ICD-10-CM | POA: Diagnosis not present

## 2015-09-25 DIAGNOSIS — F17210 Nicotine dependence, cigarettes, uncomplicated: Secondary | ICD-10-CM | POA: Diagnosis not present

## 2015-09-25 DIAGNOSIS — J45909 Unspecified asthma, uncomplicated: Secondary | ICD-10-CM | POA: Diagnosis not present

## 2015-09-25 DIAGNOSIS — M542 Cervicalgia: Secondary | ICD-10-CM | POA: Insufficient documentation

## 2015-09-25 DIAGNOSIS — R51 Headache: Secondary | ICD-10-CM | POA: Insufficient documentation

## 2015-09-25 DIAGNOSIS — R519 Headache, unspecified: Secondary | ICD-10-CM

## 2015-09-25 LAB — GLUCOSE, CSF: GLUCOSE CSF: 71 mg/dL — AB (ref 40–70)

## 2015-09-25 LAB — PROTEIN, CSF: Total  Protein, CSF: 18 mg/dL (ref 15–45)

## 2015-09-25 LAB — CSF CELL COUNT WITH DIFFERENTIAL
RBC COUNT CSF: 8 /mm3 — AB
RBC Count, CSF: 1 /mm3 — ABNORMAL HIGH
TUBE #: 1
TUBE #: 4
WBC, CSF: 1 /mm3 (ref 0–5)
WBC, CSF: 3 /mm3 (ref 0–5)

## 2015-09-25 LAB — COMPREHENSIVE METABOLIC PANEL
ALT: 17 U/L (ref 14–54)
AST: 22 U/L (ref 15–41)
Albumin: 4.4 g/dL (ref 3.5–5.0)
Alkaline Phosphatase: 66 U/L (ref 38–126)
Anion gap: 8 (ref 5–15)
BUN: 8 mg/dL (ref 6–20)
CALCIUM: 9.1 mg/dL (ref 8.9–10.3)
CHLORIDE: 105 mmol/L (ref 101–111)
CO2: 23 mmol/L (ref 22–32)
CREATININE: 0.6 mg/dL (ref 0.44–1.00)
Glucose, Bld: 136 mg/dL — ABNORMAL HIGH (ref 65–99)
Potassium: 3.9 mmol/L (ref 3.5–5.1)
Sodium: 136 mmol/L (ref 135–145)
Total Bilirubin: 0.7 mg/dL (ref 0.3–1.2)
Total Protein: 7.7 g/dL (ref 6.5–8.1)

## 2015-09-25 LAB — CBC WITH DIFFERENTIAL/PLATELET
BASOS PCT: 1 %
Basophils Absolute: 0.1 10*3/uL (ref 0.0–0.1)
EOS PCT: 1 %
Eosinophils Absolute: 0.1 10*3/uL (ref 0.0–0.7)
HCT: 38.3 % (ref 36.0–46.0)
Hemoglobin: 13.1 g/dL (ref 12.0–15.0)
LYMPHS ABS: 2.2 10*3/uL (ref 0.7–4.0)
Lymphocytes Relative: 29 %
MCH: 23.8 pg — AB (ref 26.0–34.0)
MCHC: 34.2 g/dL (ref 30.0–36.0)
MCV: 69.6 fL — AB (ref 78.0–100.0)
MONO ABS: 0.8 10*3/uL (ref 0.1–1.0)
Monocytes Relative: 11 %
NEUTROS PCT: 58 %
Neutro Abs: 4.4 10*3/uL (ref 1.7–7.7)
PLATELETS: 480 10*3/uL — AB (ref 150–400)
RBC: 5.5 MIL/uL — ABNORMAL HIGH (ref 3.87–5.11)
RDW: 14.3 % (ref 11.5–15.5)
WBC: 7.6 10*3/uL (ref 4.0–10.5)

## 2015-09-25 LAB — URINALYSIS, ROUTINE W REFLEX MICROSCOPIC
BILIRUBIN URINE: NEGATIVE
Glucose, UA: NEGATIVE mg/dL
Hgb urine dipstick: NEGATIVE
KETONES UR: NEGATIVE mg/dL
Leukocytes, UA: NEGATIVE
NITRITE: NEGATIVE
PROTEIN: NEGATIVE mg/dL
SPECIFIC GRAVITY, URINE: 1.018 (ref 1.005–1.030)
pH: 8 (ref 5.0–8.0)

## 2015-09-25 LAB — PREGNANCY, URINE: PREG TEST UR: NEGATIVE

## 2015-09-25 MED ORDER — DIAZEPAM 2 MG PO TABS
2.0000 mg | ORAL_TABLET | Freq: Once | ORAL | Status: AC
Start: 2015-09-25 — End: 2015-09-25
  Administered 2015-09-25: 2 mg via ORAL
  Filled 2015-09-25: qty 1

## 2015-09-25 MED ORDER — METOCLOPRAMIDE HCL 5 MG/ML IJ SOLN
10.0000 mg | Freq: Once | INTRAMUSCULAR | Status: AC
  Administered 2015-09-25: 10 mg via INTRAVENOUS
  Filled 2015-09-25: qty 2

## 2015-09-25 MED ORDER — IOPAMIDOL (ISOVUE-300) INJECTION 61%
75.0000 mL | Freq: Once | INTRAVENOUS | Status: AC | PRN
  Administered 2015-09-25: 75 mL via INTRAVENOUS

## 2015-09-25 MED ORDER — FENTANYL CITRATE (PF) 100 MCG/2ML IJ SOLN
100.0000 ug | Freq: Once | INTRAMUSCULAR | Status: AC
  Administered 2015-09-25: 100 ug via INTRAVENOUS
  Filled 2015-09-25: qty 2

## 2015-09-25 MED ORDER — DEXAMETHASONE SODIUM PHOSPHATE 10 MG/ML IJ SOLN
10.0000 mg | Freq: Once | INTRAMUSCULAR | Status: DC
  Filled 2015-09-25: qty 1

## 2015-09-25 MED ORDER — CLINDAMYCIN PHOSPHATE 300 MG/50ML IV SOLN
300.0000 mg | Freq: Once | INTRAVENOUS | Status: AC
  Administered 2015-09-25: 300 mg via INTRAVENOUS
  Filled 2015-09-25: qty 50

## 2015-09-25 MED ORDER — DIAZEPAM 5 MG PO TABS: 2.5000 mg | ORAL_TABLET | Freq: Three times a day (TID) | ORAL | Status: DC | PRN

## 2015-09-25 MED ORDER — MORPHINE SULFATE (PF) 4 MG/ML IV SOLN
6.0000 mg | Freq: Once | INTRAVENOUS | Status: AC
  Administered 2015-09-25: 6 mg via INTRAVENOUS
  Filled 2015-09-25: qty 2

## 2015-09-25 MED ORDER — SODIUM CHLORIDE 0.9 % IV BOLUS (SEPSIS)
1000.0000 mL | Freq: Once | INTRAVENOUS | Status: AC
Start: 2015-09-25 — End: 2015-09-25
  Administered 2015-09-25: 1000 mL via INTRAVENOUS

## 2015-09-25 MED ORDER — OXYCODONE-ACETAMINOPHEN 5-325 MG PO TABS
2.0000 | ORAL_TABLET | Freq: Once | ORAL | Status: AC
  Administered 2015-09-25: 1 via ORAL
  Filled 2015-09-25: qty 2

## 2015-09-25 MED ORDER — OXYCODONE-ACETAMINOPHEN 5-325 MG PO TABS: 1.0000 | ORAL_TABLET | ORAL | Status: DC | PRN

## 2015-09-25 MED ORDER — DIAZEPAM 2 MG PO TABS
2.0000 mg | ORAL_TABLET | Freq: Once | ORAL | Status: AC
  Administered 2015-09-25: 2 mg via ORAL
  Filled 2015-09-25: qty 1

## 2015-09-25 MED ORDER — CLINDAMYCIN HCL 300 MG PO CAPS: 300.0000 mg | ORAL_CAPSULE | Freq: Four times a day (QID) | ORAL | Status: DC

## 2015-09-25 NOTE — ED Notes (Signed)
MD to bedside to perform LP.

## 2015-09-25 NOTE — ED Notes (Signed)
RN notified of lab result

## 2015-09-25 NOTE — ED Notes (Signed)
Patient reports neck pain starting last night and the pain is now in her head. Reports being sick "for a while now" with strept throat. Patient alert, oriented, and no neuro deficits noted upon assessment.

## 2015-09-25 NOTE — ED Notes (Signed)
MD at bedside. 

## 2015-09-25 NOTE — ED Notes (Signed)
RN filled out Lumbar Puncture Consent and brought to bedside to have patient sign. Patient became tearful and stating she wants a different MD to perform procedure. Therapeutic communication performed. Agricultural consultant and MD notified. MD to bedside.  14:00 - Patient calmly laying in bed. Patient states it is fine for MD to perform procedure.

## 2015-09-25 NOTE — ED Provider Notes (Signed)
CSN: [id number]     Arrival date & time 09/25/15  [id number] History   First MD Initiated Contact with Patient 09/25/15 [phone]     Chief Complaint  Patient presents with  . Headache  . Neck Pain     (Consider location/radiation/quality/duration/timing/severity/associated sxs/prior Treatment) Patient is a 30 y.o. female presenting with headaches.  Headache Pain location:  Occipital Quality:  Dull and sharp Severity currently:  10/10 Severity at highest:  10/10 Onset quality:  Gradual Duration:  3 days Timing:  Constant Progression:  Worsening Chronicity:  New Similar to prior headaches: no   Context: not coughing   Ineffective treatments:  NSAIDs Associated symptoms: fever, neck pain, neck stiffness and sore throat   Associated symptoms: no abdominal pain, no cough, no eye pain, no facial pain and no fatigue     Past Medical History  Diagnosis Date  . Pelvic inflammatory disease   . Pancreatitis     diet controlled  . Polycystic ovary syndrome   . Pancreatic cyst   . Asthma     rarely uses inhaler  . Missed ab     x 2, one requiring D & E  . 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 Bronchitis   . Bipolar disorder ([place])     HX PPD after 2014 delivery, no meds  . Anemia   . Hypertension   . Alcohol abuse   . Renal cyst   . Plantar fasciitis    Past Surgical History  Procedure Laterality Date  . Splenectomy, total  2012  . Pancreas surgery  2012    s/p partial pancreatectomy  . Cesarean section N/A 05/23/2012    Procedure: CESAREAN SECTION;  Surgeon: Lahoma Crocker, MD;  Location: [facility];  Service: Obstetrics;  Laterality: N/A;  primary  . Dilation and curettage of uterus       for MAB  . Wisdom tooth extraction    . Cesarean section N/A 04/18/2014    Procedure: REPEAT CESAREAN SECTION;  Surgeon: Shelly Bombard, MD;  Location: [facility];  Service: Obstetrics;  Laterality: N/A;  . Eus N/A 06/11/2015    Procedure: UPPER  ENDOSCOPIC ULTRASOUND (EUS) RADIAL;  Surgeon: Milus Banister, MD;  Location: [facility];  Service: Endoscopy;  Laterality: N/A;   Family History  Problem Relation Age of Onset  . Diabetes Mother   . Hypertension Mother   . Colon cancer Neg Hx   . Asthma Father    Social History  Substance Use Topics  . Smoking status: Current Every Day Smoker -- 13 years    Types: Cigarettes  . Smokeless tobacco: Never Used     Comment: smokes 4 cigs/day, tobacco info given 04/17/15  . Alcohol Use: No     Comment: social    OB History    Gravida Para Term Preterm AB TAB SAB Ectopic Multiple Living   5 2 2  3 1 2   0 2     Review of Systems  Constitutional: Positive for fever. Negative for fatigue.  HENT: Positive for sore throat.   Eyes: Negative for pain.  Respiratory: Negative for cough and shortness of breath.   Gastrointestinal: Negative for abdominal pain.  Musculoskeletal: Positive for neck pain and neck stiffness.  Neurological: Positive for headaches.  All other systems reviewed and are negative.     Allergies  Dilaudid  Home Medications   Prior to Admission medications   Medication Sig Start Date End Date Taking?  Authorizing Provider  amoxicillin (AMOXIL) 875 MG tablet Take 1 tablet (875 mg total) by mouth 2 (two) times daily. 09/22/15  Yes Lysbeth Penner, FNP  benzonatate (TESSALON) 100 MG capsule Take 2 capsules (200 mg total) by mouth 3 (three) times daily as needed for cough. 09/22/15  Yes Lysbeth Penner, FNP  ibuprofen (ADVIL,MOTRIN) 200 MG tablet Take 400 mg by mouth every 6 (six) hours as needed for headache, mild pain or moderate pain.   Yes Historical Provider, MD  lidocaine (XYLOCAINE) 2 % solution Use as directed 20 mLs in the mouth or throat every 3 (three) hours as needed for mouth pain. 09/22/15  Yes Lysbeth Penner, FNP  lipase/protease/amylase (CREON) 12000 units CPEP capsule Take 24,000 Units by mouth 3 (three) times daily before meals.   Yes Historical  Provider, MD  metFORMIN (GLUCOPHAGE) 500 MG tablet Take 500 mg by mouth 2 (two) times daily with a meal.   Yes Historical Provider, MD  clindamycin (CLEOCIN) 300 MG capsule Take 1 capsule (300 mg total) by mouth 4 (four) times daily. X 7 days 09/25/15   Merrily Pew, MD  diazepam (VALIUM) 5 MG tablet Take 0.5 tablets (2.5 mg total) by mouth every 8 (eight) hours as needed for muscle spasms (neck pain). 09/25/15   Merrily Pew, MD  methylPREDNISolone (MEDROL DOSEPAK) 4 MG TBPK tablet Take 6-5-4-3-2-1 po qd Patient not taking: Reported on 09/25/2015 09/22/15   Lysbeth Penner, FNP  oxyCODONE-acetaminophen (PERCOCET) 5-325 MG tablet Take 1-2 tablets by mouth every 4 (four) hours as needed. 09/25/15   Corene Cornea Tammee Thielke, MD   BP 144/98 mmHg  Pulse 69  Temp(Src) 98.2 F (36.8 C) (Oral)  Resp 16  Ht 5' (1.524 m)  Wt 167 lb (75.751 kg)  BMI 32.62 kg/m2  SpO2 100%  LMP 09/05/2015 Physical Exam  Constitutional: She is oriented to person, place, and time. She appears well-developed and well-nourished.  HENT:  Head: Normocephalic and atraumatic.  Neck: Normal range of motion.  Cardiovascular: Normal rate and regular rhythm.   Pulmonary/Chest: Effort normal. No stridor. No respiratory distress. She has no wheezes.  Abdominal: She exhibits no distension.  Neurological: She is alert and oriented to person, place, and time.  No altered mental status, able to give full seemingly accurate history.  Face is symmetric, EOM's intact, pupils equal and reactive, vision intact, tongue and uvula midline without deviation Upper and Lower extremity motor 5/5, intact pain perception in distal extremities, 2+ reflexes in biceps, patella and achilles tendons.   Nursing note and vitals reviewed.   ED Course  .Lumbar Puncture Date/Time: 09/25/2015 5:38 PM Performed by: Merrily Pew Authorized by: Merrily Pew Consent: Verbal consent obtained. Written consent obtained. Risks and benefits: risks, benefits and  alternatives were discussed Consent given by: patient Patient understanding: patient states understanding of the procedure being performed Patient consent: the patient's understanding of the procedure matches consent given Required items: required blood products, implants, devices, and special equipment available Patient identity confirmed: verbally with patient Time out: Immediately prior to procedure a "time out" was called to verify the correct patient, procedure, equipment, support staff and site/side marked as required. Indications: evaluation for infection and evaluation for subarachnoid hemorrhage Anesthesia: local infiltration Local anesthetic: lidocaine 1% with epinephrine Anesthetic total: 5 ml Patient sedated: no Preparation: Patient was prepped and draped in the usual sterile fashion. Lumbar space: L3-L4 interspace Patient's position: sitting Needle gauge: 22 Needle type: diamond point Needle length: 3.5 in Number of attempts: 1 Opening  pressure: 19 (sitting) cm H2O Closing pressure: 8 (sitting) cm H2O Fluid appearance: clear Tubes of fluid: 4 Total volume: 6 ml Post-procedure: site cleaned and adhesive bandage applied Patient tolerance: Patient tolerated the procedure well with no immediate complications   (including critical care time) Labs Review Labs Reviewed  CBC WITH DIFFERENTIAL/PLATELET - Abnormal; Notable for the following:    RBC 5.50 (*)    MCV 69.6 (*)    MCH 23.8 (*)    Platelets 480 (*)    All other components within normal limits  COMPREHENSIVE METABOLIC PANEL - Abnormal; Notable for the following:    Glucose, Bld 136 (*)    All other components within normal limits  CSF CELL COUNT WITH DIFFERENTIAL - Abnormal; Notable for the following:    RBC Count, CSF 1 (*)    All other components within normal limits  CSF CELL COUNT WITH DIFFERENTIAL - Abnormal; Notable for the following:    RBC Count, CSF 8 (*)    All other components within normal limits   GLUCOSE, CSF - Abnormal; Notable for the following:    Glucose, CSF 71 (*)    All other components within normal limits  CSF CULTURE  URINALYSIS, ROUTINE W REFLEX MICROSCOPIC (NOT AT [facility])  PREGNANCY, URINE  PROTEIN, CSF    Imaging Review Ct Head Wo Contrast  09/25/2015  CLINICAL DATA:  30 year old female with headaches, neck pain and stiffness since yesterday. Initial encounter. EXAM: CT HEAD WITHOUT CONTRAST TECHNIQUE: Contiguous axial images were obtained from the base of the skull through the vertex without intravenous contrast. COMPARISON:  Neck CT from today reported separately. Head CT without contrast 07/18/2014. FINDINGS: Images at the skullbase due suggested greater degree of adenoid hypertrophy today versus in 2016, see neck CT. Small volume retained secretions in the posterior right nasal cavity. Mild paranasal sinus mucosal thickening. Mastoids and tympanic cavities are clear. No acute osseous abnormality identified. Negative orbit and scalp soft tissues. Scattered small dural calcifications, normal variant. Cerebral volume is normal. No midline shift, ventriculomegaly, mass effect, evidence of mass lesion, intracranial hemorrhage or evidence of cortically based acute infarction. Gray-white matter differentiation is within normal limits throughout the brain. No suspicious intracranial vascular hyperdensity. IMPRESSION: 1. Stable and negative Normal noncontrast CT appearance of the brain. 2. Mild paranasal sinus mucosal thickening and retained secretions. See also Neck CT from today reported separately. Electronically Signed   By: Genevie Ann M.D.   On: 09/25/2015 12:43   Ct Soft Tissue Neck W Contrast  09/25/2015  CLINICAL DATA:  30 year old female with headaches, neck pain and stiffness since yesterday. Initial encounter. EXAM: CT NECK WITH CONTRAST TECHNIQUE: Multidetector CT imaging of the neck was performed using the standard protocol following the bolus administration of intravenous  contrast. CONTRAST:  63mL ISOVUE-300 IOPAMIDOL (ISOVUE-300) INJECTION 61% COMPARISON:  Head CT from today reported separately. Head CTs 07/18/2014 and earlier. FINDINGS: Pharynx and larynx: Negative larynx. Symmetric appearing adenoid and palatine tonsil hypertrophy. No tonsillar hyper enhancement. However, there are mildly prominent bilateral retropharyngeal lymph nodes. Negative parapharyngeal spaces. Negative retropharyngeal spaces otherwise. Salivary glands: Negative sublingual space and submandibular glands. Negative parotid glands. Thyroid: Negative. Lymph nodes: Increased bilateral level 2 lymph nodes, individually up to 12 mm short axis. Increased number of smaller posterior level 2 and also level 3 nodes, more pronounced on the left. No cystic or necrotic nodes. Increase retropharyngeal nodes measure 8-9 mm short axis. Level 1 nodes are relatively spared. No thoracic inlet lymphadenopathy identified. Vascular: Aberrant origin  of the right subclavian artery. Major vascular structures in the neck and at the skullbase are patent. Limited intracranial: Negative. Visualized orbits: Negative. Mastoids and visualized paranasal sinuses: Visible tympanic cavities, mastoids, and petrous apex air cells are clear. There is mild generalized paranasal sinus mucosal thickening, most pronounced in the posterior ethmoids. Skeleton: Incidental C7 spina bifida occulta. No acute osseous abnormality identified. Upper chest: Aberrant origin of the right subclavian artery. No superior mediastinal lymphadenopathy. Negative lung apices. IMPRESSION: 1. Generalized tonsillar hypertrophy and reactive appearing bilateral retropharyngeal and level 2/3 lymphadenopathy. The appearance favors upper respiratory tract infection or mild tonsillitis. No neck abscess or other acute findings. 2. Aberrant origin right subclavian artery and C7 spina bifida occulta normal variants incidentally noted. Electronically Signed   By: Genevie Ann M.D.   On:  09/25/2015 12:41   I have personally reviewed and evaluated these images and lab results as part of my medical decision-making.   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 Exam with asymmetric swelling of the left peritonsillar area with no uvula deviation. Significant pain whenever she moves her head at all in her midline neck. Plan for CT scan of her head to evaluate for any evidence of bleed, also a CT of her neck to make sure she doesn't have a peritonsillar abscess. Patient also Likely needs lumbar puncture to evaluate for meningitis with her multiple red flags to be concerning for partially treated meningitis.  After multiple rounds of pain medication and nausea medicine the patient's symptoms have significantly improved. LP without any evidence of infection viral or bacterial. Culture is pending. Symptoms possibly related to the pharyngitis versus tonsillitis versus significant lymphadenopathy. Will continue care at home with pain medication and switch her antibiotics from amoxicillin to clindamycin, will return here for any new or worsening symptoms.    Merrily Pew, MD 09/25/15 1740

## 2015-09-25 NOTE — ED Notes (Signed)
Patient transported to CT 

## 2015-09-26 ENCOUNTER — Encounter (HOSPITAL_COMMUNITY): Payer: Self-pay | Admitting: Emergency Medicine

## 2015-09-26 ENCOUNTER — Emergency Department (HOSPITAL_COMMUNITY): Payer: Medicaid Other

## 2015-09-26 ENCOUNTER — Emergency Department (HOSPITAL_COMMUNITY)
Admission: EM | Admit: 2015-09-26 | Discharge: 2015-09-26 | Disposition: A | Discharge status: Home / Self Care | Payer: Medicaid Other | Attending: Emergency Medicine | Admitting: Emergency Medicine

## 2015-09-26 DIAGNOSIS — Z7984 Long term (current) use of oral hypoglycemic drugs: Secondary | ICD-10-CM | POA: Diagnosis not present

## 2015-09-26 DIAGNOSIS — E119 Type 2 diabetes mellitus without complications: Secondary | ICD-10-CM | POA: Diagnosis not present

## 2015-09-26 DIAGNOSIS — G971 Other reaction to spinal and lumbar puncture: Secondary | ICD-10-CM | POA: Diagnosis not present

## 2015-09-26 DIAGNOSIS — Z79899 Other long term (current) drug therapy: Secondary | ICD-10-CM | POA: Insufficient documentation

## 2015-09-26 DIAGNOSIS — I10 Essential (primary) hypertension: Secondary | ICD-10-CM | POA: Diagnosis not present

## 2015-09-26 DIAGNOSIS — J45909 Unspecified asthma, uncomplicated: Secondary | ICD-10-CM | POA: Diagnosis not present

## 2015-09-26 DIAGNOSIS — J029 Acute pharyngitis, unspecified: Secondary | ICD-10-CM | POA: Diagnosis not present

## 2015-09-26 DIAGNOSIS — F17210 Nicotine dependence, cigarettes, uncomplicated: Secondary | ICD-10-CM | POA: Diagnosis not present

## 2015-09-26 DIAGNOSIS — R0789 Other chest pain: Secondary | ICD-10-CM | POA: Diagnosis not present

## 2015-09-26 DIAGNOSIS — M542 Cervicalgia: Secondary | ICD-10-CM | POA: Diagnosis present

## 2015-09-26 LAB — BASIC METABOLIC PANEL
ANION GAP: 11 (ref 5–15)
BUN: 6 mg/dL (ref 6–20)
CALCIUM: 9.2 mg/dL (ref 8.9–10.3)
CHLORIDE: 103 mmol/L (ref 101–111)
CO2: 22 mmol/L (ref 22–32)
CREATININE: 0.6 mg/dL (ref 0.44–1.00)
GFR calc non Af Amer: >60 mL/min (ref >60)
GLUCOSE: 102 mg/dL — AB (ref 65–99)
Potassium: 3.8 mmol/L (ref 3.5–5.1)
Sodium: 136 mmol/L (ref 135–145)

## 2015-09-26 LAB — CBC
HCT: 39.3 % (ref 36.0–46.0)
Hemoglobin: 13.4 g/dL (ref 12.0–15.0)
MCH: 23.7 pg — AB (ref 26.0–34.0)
MCHC: 34.1 g/dL (ref 30.0–36.0)
MCV: 69.6 fL — AB (ref 78.0–100.0)
PLATELETS: 459 10*3/uL — AB (ref 150–400)
RBC: 5.65 MIL/uL — ABNORMAL HIGH (ref 3.87–5.11)
RDW: 14.9 % (ref 11.5–15.5)
WBC: 10.7 10*3/uL — ABNORMAL HIGH (ref 4.0–10.5)

## 2015-09-26 LAB — I-STAT TROPONIN, ED: TROPONIN I, POC: 0 ng/mL (ref 0.00–0.08)

## 2015-09-26 MED ORDER — ONDANSETRON 4 MG PO TBDP: 4.0000 mg | ORAL_TABLET | Freq: Three times a day (TID) | ORAL | Status: DC | PRN

## 2015-09-26 MED ORDER — ONDANSETRON HCL 4 MG/2ML IJ SOLN
4.0000 mg | Freq: Once | INTRAMUSCULAR | Status: AC
  Administered 2015-09-26: 4 mg via INTRAVENOUS
  Filled 2015-09-26: qty 2

## 2015-09-26 MED ORDER — FENTANYL CITRATE (PF) 100 MCG/2ML IJ SOLN
100.0000 ug | Freq: Once | INTRAMUSCULAR | Status: AC
  Administered 2015-09-26: 100 ug via INTRAVENOUS
  Filled 2015-09-26: qty 2

## 2015-09-26 MED ORDER — SODIUM CHLORIDE 0.9 % IV SOLN
500.0000 mg | Freq: Once | INTRAVENOUS | Status: AC
  Administered 2015-09-26: 500 mg via INTRAVENOUS
  Filled 2015-09-26: qty 2

## 2015-09-26 MED ORDER — PROMETHAZINE HCL 25 MG RE SUPP: 25.0000 mg | Freq: Four times a day (QID) | RECTAL | Status: DC | PRN

## 2015-09-26 MED ORDER — SODIUM CHLORIDE 0.9 % IV BOLUS (SEPSIS)
2000.0000 mL | Freq: Once | INTRAVENOUS | Status: AC
  Administered 2015-09-26: 2000 mL via INTRAVENOUS

## 2015-09-26 MED ORDER — KETOROLAC TROMETHAMINE 30 MG/ML IJ SOLN
30.0000 mg | Freq: Once | INTRAMUSCULAR | Status: AC
  Administered 2015-09-26: 30 mg via INTRAVENOUS
  Filled 2015-09-26: qty 1

## 2015-09-26 NOTE — ED Provider Notes (Signed)
CSN: [id number]     Arrival date & time 09/26/15  1902 History   First MD Initiated Contact with Patient 09/26/15 2021     Chief Complaint  Patient presents with  . Chest Pain  . Neck Pain     (Consider location/radiation/quality/duration/timing/severity/associated sxs/prior Treatment) HPI  30 year old female presents with severe chest pain since this morning. Also complaining of continued headache and neck pain, both of which are worse since yesterday. She was seen here yesterday and had a negative head CT, neck CT, and lumbar puncture. She states she's been having sore throat, headache, and neck pain for several days. Has been having fevers as well. Her headache and neck pain get worse when she sits up ever since her lumbar puncture yesterday. Chest pain started this morning and is sharp and diffuse. Worse whenever she coughs or breathes. She is currently coughing and has also been having vomiting. She thinks the vomiting started before the chest pain. No abdominal pain. She has been unable to take any of her medicines due to the vomiting. No leg pain or leg swelling.  Past Medical History  Diagnosis Date  . Pelvic inflammatory disease   . Pancreatitis     diet controlled  . Polycystic ovary syndrome   . Pancreatic cyst   . Asthma     rarely uses inhaler  . Missed ab     x 2, one requiring D & E  . 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 Bronchitis   . Bipolar disorder ([place])     HX PPD after 2014 delivery, no meds  . Anemia   . Hypertension   . Alcohol abuse   . Renal cyst   . Plantar fasciitis    Past Surgical History  Procedure Laterality Date  . Splenectomy, total  2012  . Pancreas surgery  2012    s/p partial pancreatectomy  . Cesarean section N/A 05/23/2012    Procedure: CESAREAN SECTION;  Surgeon: Lahoma Crocker, MD;  Location: [facility];  Service: Obstetrics;  Laterality: N/A;  primary  . Dilation and curettage of  uterus       for MAB  . Wisdom tooth extraction    . Cesarean section N/A 04/18/2014    Procedure: REPEAT CESAREAN SECTION;  Surgeon: Shelly Bombard, MD;  Location: [facility];  Service: Obstetrics;  Laterality: N/A;  . Eus N/A 06/11/2015    Procedure: UPPER ENDOSCOPIC ULTRASOUND (EUS) RADIAL;  Surgeon: Milus Banister, MD;  Location: [facility];  Service: Endoscopy;  Laterality: N/A;   Family History  Problem Relation Age of Onset  . Diabetes Mother   . Hypertension Mother   . Colon cancer Neg Hx   . Asthma Father    Social History  Substance Use Topics  . Smoking status: Current Every Day Smoker -- 13 years    Types: Cigarettes  . Smokeless tobacco: Never Used     Comment: smokes 4 cigs/day, tobacco info given 04/17/15  . Alcohol Use: No     Comment: social    OB History    Gravida Para Term Preterm AB TAB SAB Ectopic Multiple Living   5 2 2  3 1 2   0 2     Review of Systems    Allergies  Dilaudid  Home Medications   Prior to Admission medications   Medication Sig Start Date End Date Taking? Authorizing Provider  clindamycin (CLEOCIN) 300 MG capsule Take 300 mg by mouth  4 (four) times daily. 09/25/15 10/02/15 Yes Historical Provider, MD  diazepam (VALIUM) 5 MG tablet Take 2.5 mg by mouth every 8 (eight) hours as needed for muscle spasms.   Yes Historical Provider, MD  ibuprofen (ADVIL,MOTRIN) 200 MG tablet Take 400 mg by mouth every 6 (six) hours as needed for headache, mild pain or moderate pain.   Yes Historical Provider, MD  lipase/protease/amylase (CREON) 12000 units CPEP capsule Take 24,000 Units by mouth 3 (three) times daily before meals.   Yes Historical Provider, MD  metFORMIN (GLUCOPHAGE) 500 MG tablet Take 500 mg by mouth 2 (two) times daily with a meal.   Yes Historical Provider, MD  oxyCODONE-acetaminophen (PERCOCET/ROXICET) 5-325 MG tablet Take 1-2 tablets by mouth every 4 (four) hours as needed for severe pain.   Yes Historical Provider, MD  amoxicillin  (AMOXIL) 875 MG tablet Take 1 tablet (875 mg total) by mouth 2 (two) times daily. Patient not taking: Reported on 09/26/2015 09/22/15   Lysbeth Penner, FNP  benzonatate (TESSALON) 100 MG capsule Take 2 capsules (200 mg total) by mouth 3 (three) times daily as needed for cough. Patient not taking: Reported on 09/26/2015 09/22/15   Lysbeth Penner, FNP  lidocaine (XYLOCAINE) 2 % solution Use as directed 20 mLs in the mouth or throat every 3 (three) hours as needed for mouth pain. Patient not taking: Reported on 09/26/2015 09/22/15   Lysbeth Penner, FNP  methylPREDNISolone (MEDROL DOSEPAK) 4 MG TBPK tablet Take 6-24 mg by mouth daily. Pt is to take as a taper:  6 tablets on day 1, 5 tablets on day 2, 4 tablets on day 3....    Historical Provider, MD   BP 118/77 mmHg  Pulse 72  Temp(Src) 98 F (36.7 C) (Oral)  Resp 12  Ht 5' (1.524 m)  Wt 167 lb (75.751 kg)  BMI 32.62 kg/m2  SpO2 99%  LMP 09/05/2015 Physical Exam  ED Course  Procedures (including critical care time) Labs Review Labs Reviewed  CBC - Abnormal; Notable for the following:    WBC 10.7 (*)    RBC 5.65 (*)    MCV 69.6 (*)    MCH 23.7 (*)    Platelets 459 (*)    All other components within normal limits  BASIC METABOLIC PANEL - Abnormal; Notable for the following:    Glucose, Bld 102 (*)    All other components within normal limits  I-STAT TROPOININ, ED    Imaging Review Dg Chest 2 View  09/26/2015  CLINICAL DATA:  Patient with neck pain and headache for 2 days. Chest pain. EXAM: CHEST  2 VIEW COMPARISON:  Chest radiograph 08/22/2012 FINDINGS: Monitoring leads overlie the patient. Normal cardiac and mediastinal contours. No consolidative pulmonary opacities. No pleural effusion or pneumothorax. Regional skeleton is unremarkable. IMPRESSION: No active cardiopulmonary disease. Electronically Signed   By: Lovey Newcomer M.D.   On: 09/26/2015 19:45   Ct Head Wo Contrast  09/25/2015  CLINICAL DATA:  30 year old female with  headaches, neck pain and stiffness since yesterday. Initial encounter. EXAM: CT HEAD WITHOUT CONTRAST TECHNIQUE: Contiguous axial images were obtained from the base of the skull through the vertex without intravenous contrast. COMPARISON:  Neck CT from today reported separately. Head CT without contrast 07/18/2014. FINDINGS: Images at the skullbase due suggested greater degree of adenoid hypertrophy today versus in 2016, see neck CT. Small volume retained secretions in the posterior right nasal cavity. Mild paranasal sinus mucosal thickening. Mastoids and tympanic cavities are clear. No  acute osseous abnormality identified. Negative orbit and scalp soft tissues. Scattered small dural calcifications, normal variant. Cerebral volume is normal. No midline shift, ventriculomegaly, mass effect, evidence of mass lesion, intracranial hemorrhage or evidence of cortically based acute infarction. Gray-white matter differentiation is within normal limits throughout the brain. No suspicious intracranial vascular hyperdensity. IMPRESSION: 1. Stable and negative Normal noncontrast CT appearance of the brain. 2. Mild paranasal sinus mucosal thickening and retained secretions. See also Neck CT from today reported separately. Electronically Signed   By: Genevie Ann M.D.   On: 09/25/2015 12:43   Ct Soft Tissue Neck W Contrast  09/25/2015  CLINICAL DATA:  30 year old female with headaches, neck pain and stiffness since yesterday. Initial encounter. EXAM: CT NECK WITH CONTRAST TECHNIQUE: Multidetector CT imaging of the neck was performed using the standard protocol following the bolus administration of intravenous contrast. CONTRAST:  44mL ISOVUE-300 IOPAMIDOL (ISOVUE-300) INJECTION 61% COMPARISON:  Head CT from today reported separately. Head CTs 07/18/2014 and earlier. FINDINGS: Pharynx and larynx: Negative larynx. Symmetric appearing adenoid and palatine tonsil hypertrophy. No tonsillar hyper enhancement. However, there are mildly  prominent bilateral retropharyngeal lymph nodes. Negative parapharyngeal spaces. Negative retropharyngeal spaces otherwise. Salivary glands: Negative sublingual space and submandibular glands. Negative parotid glands. Thyroid: Negative. Lymph nodes: Increased bilateral level 2 lymph nodes, individually up to 12 mm short axis. Increased number of smaller posterior level 2 and also level 3 nodes, more pronounced on the left. No cystic or necrotic nodes. Increase retropharyngeal nodes measure 8-9 mm short axis. Level 1 nodes are relatively spared. No thoracic inlet lymphadenopathy identified. Vascular: Aberrant origin of the right subclavian artery. Major vascular structures in the neck and at the skullbase are patent. Limited intracranial: Negative. Visualized orbits: Negative. Mastoids and visualized paranasal sinuses: Visible tympanic cavities, mastoids, and petrous apex air cells are clear. There is mild generalized paranasal sinus mucosal thickening, most pronounced in the posterior ethmoids. Skeleton: Incidental C7 spina bifida occulta. No acute osseous abnormality identified. Upper chest: Aberrant origin of the right subclavian artery. No superior mediastinal lymphadenopathy. Negative lung apices. IMPRESSION: 1. Generalized tonsillar hypertrophy and reactive appearing bilateral retropharyngeal and level 2/3 lymphadenopathy. The appearance favors upper respiratory tract infection or mild tonsillitis. No neck abscess or other acute findings. 2. Aberrant origin right subclavian artery and C7 spina bifida occulta normal variants incidentally noted. Electronically Signed   By: Genevie Ann M.D.   On: 09/25/2015 12:41   I have personally reviewed and evaluated these images and lab results as part of my medical decision-making.   EKG Interpretation   Date/Time:  Saturday September 26 2015 19:30:50 EDT Ventricular Rate:  77 PR Interval:    QRS Duration: 81 QT Interval:  390 QTC Calculation: 442 R Axis:   29 Text  Interpretation:  Sinus rhythm no acute ST/T changes no significant  change since May 2016 Confirmed by Regenia Skeeter MD, Audra Kagel [phone]) on 09/26/2015  8:22:53 PM      MDM   Final diagnoses:  Chest wall pain  Pharyngitis  Post lumbar puncture headache    Patient's CP is likely chest wall from vomiting and cough. Likely has a viral illness, recommend continued treatment for pharyngitis with her asplenia. No PNA or obvious perforation or acute chest process. Highly doubt Boerhaave syndrome. Pain is greatly improved. Vomiting probably a side effect of her medicines (likely clinda). She is able to tolerate PO now. D/c with antiemetics, recommend NSAIDs as well. Is able to ambulate without difficulty. HA much improved, likely the worsening  of pain was a Post-LP headache. Discussed return precautions.    Sherwood Gambler, MD 09/27/15 [phone]

## 2015-09-26 NOTE — ED Notes (Deleted)
Called for triage x1.

## 2015-09-26 NOTE — ED Notes (Signed)
Patient is complaining of neck pain and headache x 2 days. Patient complaining of chest pain that started this morning. Patient says she is vomiting after she eats or takes medications. Patient was here 2 days ago.

## 2015-09-26 NOTE — ED Notes (Signed)
Bed: WA20 Expected date:  Expected time:  Means of arrival:  Comments: 

## 2015-09-26 NOTE — ED Notes (Signed)
MD at bedside. 

## 2015-09-28 ENCOUNTER — Encounter (HOSPITAL_BASED_OUTPATIENT_CLINIC_OR_DEPARTMENT_OTHER): Payer: Self-pay | Admitting: *Deleted

## 2015-09-28 ENCOUNTER — Emergency Department (HOSPITAL_BASED_OUTPATIENT_CLINIC_OR_DEPARTMENT_OTHER)
Admission: EM | Admit: 2015-09-28 | Discharge: 2015-09-29 | Disposition: A | Discharge status: Home / Self Care | Payer: Medicaid Other | Attending: Emergency Medicine | Admitting: Emergency Medicine

## 2015-09-28 ENCOUNTER — Emergency Department (HOSPITAL_COMMUNITY): Payer: Medicaid Other

## 2015-09-28 DIAGNOSIS — R202 Paresthesia of skin: Secondary | ICD-10-CM

## 2015-09-28 DIAGNOSIS — M542 Cervicalgia: Secondary | ICD-10-CM | POA: Insufficient documentation

## 2015-09-28 DIAGNOSIS — F17210 Nicotine dependence, cigarettes, uncomplicated: Secondary | ICD-10-CM | POA: Insufficient documentation

## 2015-09-28 DIAGNOSIS — E119 Type 2 diabetes mellitus without complications: Secondary | ICD-10-CM | POA: Diagnosis not present

## 2015-09-28 DIAGNOSIS — J45909 Unspecified asthma, uncomplicated: Secondary | ICD-10-CM | POA: Diagnosis not present

## 2015-09-28 DIAGNOSIS — Z7984 Long term (current) use of oral hypoglycemic drugs: Secondary | ICD-10-CM | POA: Insufficient documentation

## 2015-09-28 DIAGNOSIS — I10 Essential (primary) hypertension: Secondary | ICD-10-CM | POA: Diagnosis not present

## 2015-09-28 DIAGNOSIS — G971 Other reaction to spinal and lumbar puncture: Secondary | ICD-10-CM | POA: Diagnosis not present

## 2015-09-28 DIAGNOSIS — R42 Dizziness and giddiness: Secondary | ICD-10-CM

## 2015-09-28 DIAGNOSIS — R519 Headache, unspecified: Secondary | ICD-10-CM

## 2015-09-28 DIAGNOSIS — R51 Headache: Secondary | ICD-10-CM | POA: Insufficient documentation

## 2015-09-28 LAB — CBC WITH DIFFERENTIAL/PLATELET
Basophils Absolute: 0 10*3/uL (ref 0.0–0.1)
Basophils Relative: 0 %
EOS PCT: 1 %
Eosinophils Absolute: 0.1 10*3/uL (ref 0.0–0.7)
HEMATOCRIT: 36.8 % (ref 36.0–46.0)
HEMOGLOBIN: 12.9 g/dL (ref 12.0–15.0)
LYMPHS ABS: 2.2 10*3/uL (ref 0.7–4.0)
Lymphocytes Relative: 22 %
MCH: 23.7 pg — AB (ref 26.0–34.0)
MCHC: 35.1 g/dL (ref 30.0–36.0)
MCV: 67.5 fL — AB (ref 78.0–100.0)
MONOS PCT: 7 %
Monocytes Absolute: 0.7 10*3/uL (ref 0.1–1.0)
NEUTROS ABS: 7.1 10*3/uL (ref 1.7–7.7)
Neutrophils Relative %: 70 %
Platelets: 453 10*3/uL — ABNORMAL HIGH (ref 150–400)
RBC: 5.45 MIL/uL — ABNORMAL HIGH (ref 3.87–5.11)
RDW: 14.3 % (ref 11.5–15.5)
WBC: 10.1 10*3/uL (ref 4.0–10.5)

## 2015-09-28 LAB — BASIC METABOLIC PANEL
Anion gap: 8 (ref 5–15)
BUN: 7 mg/dL (ref 6–20)
CHLORIDE: 104 mmol/L (ref 101–111)
CO2: 25 mmol/L (ref 22–32)
Calcium: 9 mg/dL (ref 8.9–10.3)
Creatinine, Ser: 0.51 mg/dL (ref 0.44–1.00)
GLUCOSE: 109 mg/dL — AB (ref 65–99)
POTASSIUM: 3.4 mmol/L — AB (ref 3.5–5.1)
SODIUM: 137 mmol/L (ref 135–145)

## 2015-09-28 LAB — SEDIMENTATION RATE: Sed Rate: 8 mm/hr (ref 0–22)

## 2015-09-28 LAB — CBG MONITORING, ED: Glucose-Capillary: 99 mg/dL (ref 65–99)

## 2015-09-28 MED ORDER — MORPHINE SULFATE (PF) 4 MG/ML IV SOLN
4.0000 mg | Freq: Once | INTRAVENOUS | Status: AC
Start: 2015-09-28 — End: 2015-09-28
  Administered 2015-09-28: 4 mg via INTRAVENOUS
  Filled 2015-09-28: qty 1

## 2015-09-28 MED ORDER — SODIUM CHLORIDE 0.9 % IV SOLN
INTRAVENOUS | Status: DC
  Administered 2015-09-28: 19:00:00 via INTRAVENOUS

## 2015-09-28 MED ORDER — ONDANSETRON HCL 4 MG/2ML IJ SOLN
4.0000 mg | Freq: Once | INTRAMUSCULAR | Status: AC
  Administered 2015-09-28: 4 mg via INTRAVENOUS

## 2015-09-28 MED ORDER — METOCLOPRAMIDE HCL 5 MG/ML IJ SOLN
10.0000 mg | Freq: Once | INTRAMUSCULAR | Status: AC
  Administered 2015-09-28: 10 mg via INTRAVENOUS
  Filled 2015-09-28: qty 2

## 2015-09-28 MED ORDER — MORPHINE SULFATE (PF) 4 MG/ML IV SOLN
4.0000 mg | Freq: Once | INTRAVENOUS | Status: AC
  Administered 2015-09-28: 4 mg via INTRAVENOUS
  Filled 2015-09-28: qty 1

## 2015-09-28 MED ORDER — ONDANSETRON HCL 4 MG/2ML IJ SOLN
INTRAMUSCULAR | Status: AC
  Filled 2015-09-28: qty 2

## 2015-09-28 MED ORDER — DIPHENHYDRAMINE HCL 50 MG/ML IJ SOLN
25.0000 mg | Freq: Once | INTRAMUSCULAR | Status: AC
  Administered 2015-09-28: 25 mg via INTRAVENOUS
  Filled 2015-09-28: qty 1

## 2015-09-28 NOTE — ED Provider Notes (Signed)
[facility] DEPT Provider Note   CSN: [id number] Arrival date & time: 09/28/15  1446  First Provider Contact:  None    History   Chief Complaint Chief Complaint  Patient presents with  . Dizziness    HPI [name] is a 30 y.o. female.  The history is provided by the patient and medical records. No language interpreter was used.       [name] is a 30 y.o. female  with a hx of asthma, bronchitis, HTN and DM presents to the Emergency Department complaining of sudden, intermittent dizziness onset 1 week ago. Associated symptoms include nausea, neck pain, vomiting, photophobia, blurred vision and back pain.  Headache is generalized and described as pressure.  She also reports tingling and weakness in her bilateral arms.  Pain is better when laying down on her back and is worse with sitting up.  She has been evaluated in the ED 4x in the last week with unremarkable head CT and LP.  She has not attempted to take any of the medications she has been given.  Pt denies fever, chills, known tick bites.  Patient was evaluated earlier today at [facility] and transferred here for MRI.  He reports that she has some residual head pressure but that all of her other symptoms have resolved including her dizziness, nausea, vomiting, photophobia, blurred vision and most of her neck pain. She reports she has some persistent low back pain at the site of the LP.  Past Medical History:  Diagnosis Date  . Alcohol abuse   . Anemia   . Asthma    rarely uses inhaler  . Bipolar disorder ([place])    HX PPD after 2014 delivery, no meds  . Bronchitis   . 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 Hypertension   . Missed ab    x 2, one requiring D & E  . Pancreatic cyst   . Pancreatitis    diet controlled  . Pelvic inflammatory disease   . Plantar fasciitis   . Polycystic ovary syndrome   . Renal cyst     Patient Active Problem  List   Diagnosis Date Noted  . HTN (hypertension) 08/13/2014  . Pre-eclampsia 04/29/2014  . S/P cesarean section 04/18/2014  . [redacted] weeks gestation of pregnancy   . [redacted] weeks gestation of pregnancy   . [redacted] weeks gestation of pregnancy   . [redacted] weeks gestation of pregnancy   . Diabetes mellitus in pregnancy, antepartum   . Diabetes mellitus in pregnancy in second trimester   . [redacted] weeks gestation of pregnancy   . Encounter for routine screening for malformation using ultrasonics   . 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 Vitamin D deficiency 12/29/2013  . H/O macrosomia in infant in prior pregnancy, currently pregnant 12/25/2013  . Previous cesarean delivery, antepartum 12/25/2013  . Tobacco use during pregnancy 12/25/2013  . Asthma 12/25/2013  . Pregnancy and non-insulin-dependent diabetes mellitus ([place]) 05/22/2012  . Genital warts 12/22/2011  . Pancreatic pseudocyst 06/24/2010    Past Surgical History:  Procedure Laterality Date  . CESAREAN SECTION N/A 05/23/2012   Procedure: CESAREAN SECTION;  Surgeon: Lahoma Crocker, MD;  Location: [facility];  Service: Obstetrics;  Laterality: N/A;  primary  . CESAREAN SECTION N/A 04/18/2014   Procedure: REPEAT CESAREAN SECTION;  Surgeon: Shelly Bombard, MD;  Location: [facility];  Service: Obstetrics;  Laterality: N/A;  . DILATION AND CURETTAGE OF UTERUS  for MAB  . EUS N/A 06/11/2015   Procedure: UPPER ENDOSCOPIC ULTRASOUND (EUS) RADIAL;  Surgeon: Milus Banister, MD;  Location: [facility];  Service: Endoscopy;  Laterality: N/A;  . PANCREAS SURGERY  2012   s/p partial pancreatectomy  . SPLENECTOMY, TOTAL  2012  . WISDOM TOOTH EXTRACTION      OB History    Gravida Para Term Preterm AB Living   5 2 2   3 2    SAB TAB Ectopic Multiple Live Births   2 1   0         Home Medications    Prior to Admission medications   Medication Sig Start Date End Date Taking? Authorizing Provider  clindamycin (CLEOCIN) 300 MG capsule Take 300 mg by  mouth 4 (four) times daily. 09/25/15 10/02/15 Yes Historical Provider, MD  diazepam (VALIUM) 5 MG tablet Take 2.5 mg by mouth every 8 (eight) hours as needed for muscle spasms.   Yes Historical Provider, MD  ibuprofen (ADVIL,MOTRIN) 200 MG tablet Take 400 mg by mouth every 6 (six) hours as needed for headache, mild pain or moderate pain.   Yes Historical Provider, MD  lipase/protease/amylase (CREON) 12000 units CPEP capsule Take 24,000 Units by mouth 3 (three) times daily before meals.   Yes Historical Provider, MD  metFORMIN (GLUCOPHAGE) 500 MG tablet Take 500 mg by mouth 2 (two) times daily with a meal.   Yes Historical Provider, MD  methylPREDNISolone (MEDROL DOSEPAK) 4 MG TBPK tablet Take 6-24 mg by mouth daily. Pt is to take as a taper:  6 tablets on day 1, 5 tablets on day 2, 4 tablets on day 3....   Yes Historical Provider, MD  ondansetron (ZOFRAN ODT) 4 MG disintegrating tablet Take 1 tablet (4 mg total) by mouth every 8 (eight) hours as needed for nausea or vomiting. 09/26/15  Yes Sherwood Gambler, MD  oxyCODONE-acetaminophen (PERCOCET/ROXICET) 5-325 MG tablet Take 1-2 tablets by mouth every 4 (four) hours as needed for severe pain.   Yes Historical Provider, MD  promethazine (PHENERGAN) 25 MG suppository Place 1 suppository (25 mg total) rectally every 6 (six) hours as needed for nausea, vomiting or refractory nausea / vomiting. 09/26/15  Yes Sherwood Gambler, MD    Family History Family History  Problem Relation Age of Onset  . Diabetes Mother   . Hypertension Mother   . Asthma Father   . Colon cancer Neg Hx     Social History Social History  Substance Use Topics  . Smoking status: Current Every Day Smoker    Years: 13.00    Types: Cigarettes  . Smokeless tobacco: Never Used     Comment: smokes 4 cigs/day, tobacco info given 04/17/15  . Alcohol use No     Comment: social      Allergies   Dilaudid [hydromorphone hcl]   Review of Systems Review of Systems  Eyes: Positive for  photophobia and visual disturbance.  Gastrointestinal: Positive for nausea and vomiting.  Musculoskeletal: Positive for back pain and neck pain.  Neurological: Positive for dizziness, weakness ( bilateral upper arms) and headaches.  All other systems reviewed and are negative.    Physical Exam Updated Vital Signs BP 139/81 (BP Location: Left Arm)   Pulse 60   Temp 98.1 F (36.7 C) (Oral)   Resp 18   Ht 5' (1.524 m)   Wt 75.8 kg   LMP 09/05/2015   SpO2 100%   BMI 32.61 kg/m   Physical Exam  Constitutional: She is  oriented to person, place, and time. She appears well-developed and well-nourished. No distress.  HENT:  Head: Normocephalic and atraumatic.  Mouth/Throat: Oropharynx is clear and moist.  Eyes: Conjunctivae and EOM are normal. Pupils are equal, round, and reactive to light. No scleral icterus.  No horizontal, vertical or rotational nystagmus  Neck: Normal range of motion. Neck supple.  Full active and passive ROM without pain No midline or paraspinal tenderness No nuchal rigidity or meningeal signs  Cardiovascular: Normal rate, regular rhythm and intact distal pulses.   Pulmonary/Chest: Effort normal and breath sounds normal. No respiratory distress. She has no wheezes. She has no rales.  Abdominal: Soft. Bowel sounds are normal. There is no tenderness. There is no rebound and no guarding.  Musculoskeletal: Normal range of motion.  Lymphadenopathy:    She has no cervical adenopathy.  Neurological: She is alert and oriented to person, place, and time. She has normal reflexes. No cranial nerve deficit. She exhibits normal muscle tone. Coordination normal.  Mental Status:  Alert, oriented, thought content appropriate. Speech fluent without evidence of aphasia. Able to follow 2 step commands without difficulty.  Cranial Nerves:  II:  Peripheral visual fields grossly normal, pupils equal, round, reactive to light III,IV, VI: ptosis not present, extra-ocular motions  intact bilaterally  V,VII: smile symmetric, facial light touch sensation equal VIII: hearing grossly normal bilaterally  IX,X: midline uvula rise  XI: bilateral shoulder shrug equal and strong XII: midline tongue extension  Motor:  5/5 in upper and lower extremities bilaterally including strong and equal grip strength and dorsiflexion/plantar flexion Sensory: Pinprick and light touch normal in all extremities.  Deep Tendon Reflexes: 2+ and symmetric  Cerebellar: normal finger-to-nose with bilateral upper extremities Gait: normal gait and balance CV: distal pulses palpable throughout   Skin: Skin is warm and dry. No rash noted. She is not diaphoretic.  Psychiatric: She has a normal mood and affect. Her behavior is normal. Judgment and thought content normal.  Nursing note and vitals reviewed.    ED Treatments / Results  Labs (all labs ordered are listed, but only abnormal results are displayed) Labs Reviewed  BASIC METABOLIC PANEL - Abnormal; Notable for the following:       Result Value   Potassium 3.4 (*)    Glucose, Bld 109 (*)    All other components within normal limits  CBC WITH DIFFERENTIAL/PLATELET - Abnormal; Notable for the following:    RBC 5.45 (*)    MCV 67.5 (*)    MCH 23.7 (*)    Platelets 453 (*)    All other components within normal limits  SEDIMENTATION RATE  CBG MONITORING, ED    EKG  EKG Interpretation None       Radiology Mr Brain Wo Contrast (neuro Protocol)  Result Date: 09/28/2015 CLINICAL DATA:  Initial evaluation for dizziness with Tomasita Crumble these is. EXAM: MRI HEAD WITHOUT CONTRAST MRI CERVICAL SPINE WITHOUT CONTRAST TECHNIQUE: Multiplanar, multiecho pulse sequences of the brain and surrounding structures, and cervical spine, to include the craniocervical junction and cervicothoracic junction, were obtained without intravenous contrast. COMPARISON:  Prior CT from 09/25/2015. FINDINGS: MRI HEAD FINDINGS Cerebral volume normal for patient age. No  focal parenchymal signal abnormality identified. No abnormal foci of restricted diffusion to suggest acute infarct. Gray-white matter differentiation well maintained. Major intracranial vascular flow voids are preserved. No acute or chronic intracranial hemorrhage. No areas of chronic infarction. No mass lesion, midline shift, or mass effect. No hydrocephalus. No extra-axial fluid collection. Major dural sinuses  are grossly patent. Craniocervical junction normal. Visualized upper cervical spine normal. Pituitary gland within normal limits. No acute abnormality about the globes and orbits. Mild scattered mucosal thickening within the ethmoidal air cells and sphenoid sinuses. Paranasal sinuses are otherwise clear. Trace opacity left mastoid air cells. Inner ear structures grossly normal. Bone marrow signal intensity within normal limits. No scalp soft tissue abnormality. MRI CERVICAL SPINE FINDINGS Alignment: Straightening of the normal cervical lordosis. No listhesis or subluxation. Vertebrae: Vertebral body heights well preserved. No acute or chronic fracture. Signal intensity within the vertebral body bone marrow within normal limits. No focal osseous lesions. No marrow edema. Cord: Signal intensity within the cervical spinal cord is normal. Posterior Fossa, vertebral arteries, paraspinal tissues: Posterior fossa within normal limits. Mildly prominent level 2 lymph nodes measuring up to 12 mm bilaterally, similar relative to recent CT. Paraspinous soft tissues otherwise demonstrate no acute abnormality. Normal intravascular flow voids present within the vertebral arteries bilaterally. Disc levels: No significant degenerative changes present within the cervical spine. No disc bulge or focal disc herniation. No significant canal or foraminal stenosis. IMPRESSION: MRI HEAD IMPRESSION: Normal brain MRI.  No acute intracranial process identified. MRA HEAD IMPRESSION: 1. Straightening of the normal cervical lordosis,  which may be related positioning or muscular spasm. 2. Otherwise negative MRI of the cervical spine. No significant degenerative changes identified. No canal stenosis or foraminal encroachment. 3. Mildly prominent level II adenopathy bilaterally, similar relative to recent CT from 09/25/2015. Again, these are likely reactive in nature. Electronically Signed   By: Jeannine Boga M.D.   On: 09/28/2015 23:19  Mr Cervical Spine Wo Contrast  Result Date: 09/28/2015 CLINICAL DATA:  Initial evaluation for dizziness with Tomasita Crumble these is. EXAM: MRI HEAD WITHOUT CONTRAST MRI CERVICAL SPINE WITHOUT CONTRAST TECHNIQUE: Multiplanar, multiecho pulse sequences of the brain and surrounding structures, and cervical spine, to include the craniocervical junction and cervicothoracic junction, were obtained without intravenous contrast. COMPARISON:  Prior CT from 09/25/2015. FINDINGS: MRI HEAD FINDINGS Cerebral volume normal for patient age. No focal parenchymal signal abnormality identified. No abnormal foci of restricted diffusion to suggest acute infarct. Gray-white matter differentiation well maintained. Major intracranial vascular flow voids are preserved. No acute or chronic intracranial hemorrhage. No areas of chronic infarction. No mass lesion, midline shift, or mass effect. No hydrocephalus. No extra-axial fluid collection. Major dural sinuses are grossly patent. Craniocervical junction normal. Visualized upper cervical spine normal. Pituitary gland within normal limits. No acute abnormality about the globes and orbits. Mild scattered mucosal thickening within the ethmoidal air cells and sphenoid sinuses. Paranasal sinuses are otherwise clear. Trace opacity left mastoid air cells. Inner ear structures grossly normal. Bone marrow signal intensity within normal limits. No scalp soft tissue abnormality. MRI CERVICAL SPINE FINDINGS Alignment: Straightening of the normal cervical lordosis. No listhesis or subluxation.  Vertebrae: Vertebral body heights well preserved. No acute or chronic fracture. Signal intensity within the vertebral body bone marrow within normal limits. No focal osseous lesions. No marrow edema. Cord: Signal intensity within the cervical spinal cord is normal. Posterior Fossa, vertebral arteries, paraspinal tissues: Posterior fossa within normal limits. Mildly prominent level 2 lymph nodes measuring up to 12 mm bilaterally, similar relative to recent CT. Paraspinous soft tissues otherwise demonstrate no acute abnormality. Normal intravascular flow voids present within the vertebral arteries bilaterally. Disc levels: No significant degenerative changes present within the cervical spine. No disc bulge or focal disc herniation. No significant canal or foraminal stenosis. IMPRESSION: MRI HEAD IMPRESSION: Normal brain MRI.  No acute intracranial process identified. MRA HEAD IMPRESSION: 1. Straightening of the normal cervical lordosis, which may be related positioning or muscular spasm. 2. Otherwise negative MRI of the cervical spine. No significant degenerative changes identified. No canal stenosis or foraminal encroachment. 3. Mildly prominent level II adenopathy bilaterally, similar relative to recent CT from 09/25/2015. Again, these are likely reactive in nature. Electronically Signed   By: Jeannine Boga M.D.   On: 09/28/2015 23:19   Procedures Procedures (including critical care time)  Medications Ordered in ED Medications  0.9 %  sodium chloride infusion ( Intravenous New Bag/Given 09/28/15 1839)  morphine 4 MG/ML injection 4 mg (4 mg Intravenous Given 09/28/15 1839)  diphenhydrAMINE (BENADRYL) injection 25 mg (25 mg Intravenous Given 09/28/15 1839)  metoCLOPramide (REGLAN) injection 10 mg (10 mg Intravenous Given 09/28/15 1839)  ondansetron (ZOFRAN) injection 4 mg (4 mg Intravenous Given by Other 09/28/15 2016)  morphine 4 MG/ML injection 4 mg (4 mg Intravenous Given 09/28/15 2338)      Initial Impression / Assessment and Plan / ED Course  I have reviewed the triage vital signs and the nursing notes.  Pertinent labs & imaging results that were available during my care of the patient were reviewed by me and considered in my medical decision making (see chart for details).  Clinical Course   Anaiz Y Poblete presents with persistent headache, worse after LP.  Cultures reviewed. CSF culture and Gram stain were negative. Patient is afebrile. At this time she is almost completely symptom-free. She has tolerated by mouth here in the emergency department without difficulty. No vomiting here.    MRI of head and neck are without acute abnormalities. No evidence of transverse myelitis. No evidence of intercranial hemorrhage or demyelination. Suspect patient's worsening headache is secondary to her LP as it is positional.  1:26 AM MRI without acute abnormality.  VM left for IR to schedule outpatient blood patch for persistent symptoms.  Pt is well appearing and without emesis here in the ED. She ambulates without assistance or difficulty.    Final Clinical Impressions(s) / ED Diagnoses   Final diagnoses:  Dizziness  Paresthesia of upper extremity  Occipital headache  Acute intractable headache, unspecified headache type    New Prescriptions New Prescriptions   No medications on file     Abigail Butts, PA-C 09/29/15 0129    Ripley Fraise, MD 09/29/15 [phone]

## 2015-09-28 NOTE — ED Notes (Signed)
[facility] here for transport 

## 2015-09-28 NOTE — ED Notes (Signed)
PA-C at bedside 

## 2015-09-28 NOTE — ED Notes (Signed)
Patient A&O x 4, in no distress. Patient transported to MRI.

## 2015-09-28 NOTE — ED Triage Notes (Addendum)
Pt c/o dizziness x 1 week. Seen at [facility] and [facility] ED x 4 in last week for same

## 2015-09-28 NOTE — ED Notes (Signed)
Patient returned from MRI. In no distress.

## 2015-09-28 NOTE — ED Notes (Signed)
MD aware patient requesting pain medication.  No new orders at this time.

## 2015-09-28 NOTE — ED Notes (Signed)
Report given to [facility] RN with [facility]

## 2015-09-28 NOTE — ED Notes (Signed)
PA states okay for pt to eat/drink. CBG 99. Pt passed stroke swallow screen. Provided patient with diet ginger ale and graham crackers and peanut butter.

## 2015-09-29 ENCOUNTER — Other Ambulatory Visit: Payer: Medicaid Other

## 2015-09-29 ENCOUNTER — Other Ambulatory Visit: Payer: Self-pay | Admitting: Physician Assistant

## 2015-09-29 DIAGNOSIS — R42 Dizziness and giddiness: Secondary | ICD-10-CM

## 2015-09-29 DIAGNOSIS — R202 Paresthesia of skin: Secondary | ICD-10-CM

## 2015-09-29 DIAGNOSIS — R519 Headache, unspecified: Secondary | ICD-10-CM

## 2015-09-29 DIAGNOSIS — R51 Headache: Secondary | ICD-10-CM

## 2015-09-29 LAB — CBG MONITORING, ED
Comment 1: 10422
GLUCOSE-CAPILLARY: 88 mg/dL (ref 65–99)

## 2015-09-29 LAB — CSF CULTURE W GRAM STAIN
Culture: NO GROWTH
Gram Stain: NONE SEEN

## 2015-09-29 LAB — CSF CULTURE

## 2015-09-29 NOTE — Discharge Instructions (Signed)
1. Medications: usual home medications 2. Treatment: rest, drink plenty of fluids, lay flat as much as possible 3. Follow Up: I have attempted to schedule a blood patch for you tomorrow. You should hear from the interventional radiology clinic by noon today. If you do not hear from them please call the number listed on your discharge paperwork.

## 2015-09-30 ENCOUNTER — Other Ambulatory Visit: Payer: Medicaid Other

## 2015-10-27 ENCOUNTER — Ambulatory Visit (INDEPENDENT_AMBULATORY_CARE_PROVIDER_SITE_OTHER): Payer: Medicaid Other | Admitting: Obstetrics

## 2015-10-27 VITALS — BP 133/95 | HR 82 | Ht 60.0 in

## 2015-10-27 DIAGNOSIS — Z0000 Encounter for general adult medical examination without abnormal findings: Secondary | ICD-10-CM

## 2015-10-27 DIAGNOSIS — Z01419 Encounter for gynecological examination (general) (routine) without abnormal findings: Secondary | ICD-10-CM

## 2015-10-27 DIAGNOSIS — Z3041 Encounter for surveillance of contraceptive pills: Secondary | ICD-10-CM

## 2015-10-27 NOTE — Progress Notes (Signed)
Patient ID: [name], female   DOB: 03/08/1985, 30 y.o.   MRN: [id number]   Subjective:        [name] is a 30 y.o. female here for a routine exam.  Current complaints: Recent treatment of vaginal yeast infection.  Requests STD labs.    Personal health questionnaire:  Is [name], have a family history of breast and/or ovarian cancer: no Is there a family history of uterine cancer diagnosed at age < 64, gastrointestinal cancer, urinary tract cancer, family member who is a [name] syndrome-associated carrier: no Is the patient overweight and hypertensive, family history of diabetes, personal history of gestational diabetes, preeclampsia or PCOS: no Is patient over 58, have PCOS,  family history of premature CHD under age 70, diabetes, smoke, have hypertension or peripheral artery disease:  no At any time, has a partner hit, kicked or otherwise hurt or frightened you?: no Over the past 2 weeks, have you felt down, depressed or hopeless?: no Over the past 2 weeks, have you felt little interest or pleasure in doing things?:no   Gynecologic History Patient's last menstrual period was 10/25/2015. Contraception: OCP (estrogen/progesterone) Last Pap: 2016. Results were: abnormal ( + HPV ) Last mammogram: n/a. Results were: n/a  Obstetric History OB History  Gravida Para Term Preterm AB Living  5 2 2   3 2   SAB TAB Ectopic Multiple Live Births  2 1   0 2    # Outcome Date GA Lbr Len/2nd Weight Sex Delivery Anes PTL Lv  5 Term 04/18/14 [redacted]w[redacted]d  8 lb 10.6 oz (3.93 kg) M CS-Vac Spinal  LIV  4 Term 05/23/12 [redacted]w[redacted]d  9 lb 14.2 oz (4.485 kg) M CS-LTranv Spinal  LIV     Birth Comments: No problems at birth  2 TAB           2 SAB           1 SAB               Past Medical History:  Diagnosis Date  . Alcohol abuse   . Anemia   . Asthma    rarely uses inhaler  . Bipolar disorder ([place])    HX PPD after 2014 delivery, no meds  . Bronchitis   . 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 Hypertension   . Missed ab    x 2, one requiring D & E  . Pancreatic cyst   . Pancreatitis    diet controlled  . Pelvic inflammatory disease   . Plantar fasciitis   . Polycystic ovary syndrome   . Renal cyst     Past Surgical History:  Procedure Laterality Date  . CESAREAN SECTION N/A 05/23/2012   Procedure: CESAREAN SECTION;  Surgeon: Lahoma Crocker, MD;  Location: [facility];  Service: Obstetrics;  Laterality: N/A;  primary  . CESAREAN SECTION N/A 04/18/2014   Procedure: REPEAT CESAREAN SECTION;  Surgeon: Shelly Bombard, MD;  Location: [facility];  Service: Obstetrics;  Laterality: N/A;  . DILATION AND CURETTAGE OF UTERUS      for MAB  . EUS N/A 06/11/2015   Procedure: UPPER ENDOSCOPIC ULTRASOUND (EUS) RADIAL;  Surgeon: Milus Banister, MD;  Location: [facility];  Service: Endoscopy;  Laterality: N/A;  . PANCREAS SURGERY  2012   s/p partial pancreatectomy  . SPLENECTOMY, TOTAL  2012  . WISDOM TOOTH EXTRACTION       Current Outpatient Prescriptions:  .  diazepam (VALIUM) 5 MG tablet, Take 2.5 mg by mouth every 8 (eight) hours as needed for muscle spasms., Disp: , Rfl:  .  lipase/protease/amylase (CREON) 12000 units CPEP capsule, Take 24,000 Units by mouth 3 (three) times daily before meals., Disp: , Rfl:  .  metFORMIN (GLUCOPHAGE) 500 MG tablet, Take 500 mg by mouth 2 (two) times daily with a meal., Disp: , Rfl:  .  ibuprofen (ADVIL,MOTRIN) 200 MG tablet, Take 400 mg by mouth every 6 (six) hours as needed for headache, mild pain or moderate pain., Disp: , Rfl:  .  ondansetron (ZOFRAN ODT) 4 MG disintegrating tablet, Take 1 tablet (4 mg total) by mouth every 8 (eight) hours as needed for nausea or vomiting. (Patient not taking: Reported on 10/27/2015), Disp: 10 tablet, Rfl: 0 .  oxyCODONE-acetaminophen (PERCOCET/ROXICET) 5-325 MG tablet, Take 1-2 tablets by mouth every 4 (four) hours as needed for severe  pain., Disp: , Rfl:  .  promethazine (PHENERGAN) 25 MG suppository, Place 1 suppository (25 mg total) rectally every 6 (six) hours as needed for nausea, vomiting or refractory nausea / vomiting. (Patient not taking: Reported on 10/27/2015), Disp: 4 each, Rfl: 0 Allergies  Allergen Reactions  . Dilaudid [Hydromorphone Hcl] Itching and Other (See Comments)    Reaction:  Hallucinations    Social History  Substance Use Topics  . Smoking status: Current Every Day Smoker    Years: 13.00    Types: Cigarettes  . Smokeless tobacco: Never Used     Comment: smokes 4 cigs/day, tobacco info given 04/17/15  . Alcohol use No     Comment: social     Family History  Problem Relation Age of Onset  . Diabetes Mother   . Hypertension Mother   . Asthma Father   . Colon cancer Neg Hx       Review of Systems  Constitutional: negative for fatigue and weight loss Respiratory: negative for cough and wheezing Cardiovascular: negative for chest pain, fatigue and palpitations Gastrointestinal: negative for abdominal pain and change in bowel habits Musculoskeletal:negative for myalgias Neurological: negative for gait problems and tremors Behavioral/Psych: negative for abusive relationship, depression Endocrine: negative for temperature intolerance   Genitourinary:negative for abnormal menstrual periods, genital lesions, hot flashes, sexual problems and vaginal discharge Integument/breast: negative for breast lump, breast tenderness, nipple discharge and skin lesion(s)    Objective:       BP (!) 133/95   Pulse 82   Ht 5' (1.524 m)   LMP 10/25/2015  General:   alert  Skin:   no rash or abnormalities  Lungs:   clear to auscultation bilaterally  Heart:   regular rate and rhythm, S1, S2 normal, no murmur, click, rub or gallop  Breasts:   normal without suspicious masses, skin or nipple changes or axillary nodes  Abdomen:  normal findings: no organomegaly, soft, non-tender and no hernia  Pelvis:   External genitalia: normal general appearance Urinary system: urethral meatus normal and bladder without fullness, nontender Vaginal: normal without tenderness, induration or masses Cervix: normal appearance Adnexa: normal bimanual exam Uterus: anteverted and non-tender, normal size   Lab Review Urine pregnancy test Labs reviewed yes Radiologic studies reviewed no    Assessment:    Healthy female exam.    Contraceptive Surveillance.  Pleased with OCP's  Tobacco Dependence.  Relative contraindication to OCP's explained.  Smoking cessation strongly encouraged.  Chantix offered but she refused.   Plan:    Education reviewed: calcium supplements, depression evaluation, low fat, low cholesterol  diet, safe sex/STD prevention, self breast exams, smoking cessation and weight bearing exercise. Contraception: OCP (estrogen/progesterone). Follow up in: 1 year.   No orders of the defined types were placed in this encounter.  No orders of the defined types were placed in this encounter.

## 2015-10-29 LAB — PAP IG AND HPV HIGH-RISK
HPV, HIGH-RISK: NEGATIVE
PAP Smear Comment: 0

## 2015-10-30 LAB — NUSWAB VG+, CANDIDA 6SP
CANDIDA KRUSEI, NAA: NEGATIVE
CANDIDA LUSITANIAE, NAA: NEGATIVE
CANDIDA PARAPSILOSIS, NAA: NEGATIVE
Candida albicans, NAA: NEGATIVE
Candida glabrata, NAA: NEGATIVE
Candida tropicalis, NAA: NEGATIVE
Chlamydia trachomatis, NAA: NEGATIVE
Neisseria gonorrhoeae, NAA: NEGATIVE
TRICH VAG BY NAA: NEGATIVE

## 2015-11-12 ENCOUNTER — Encounter: Payer: Self-pay | Admitting: Obstetrics

## 2016-03-21 ENCOUNTER — Inpatient Hospital Stay (HOSPITAL_COMMUNITY)
Admission: AD | Admit: 2016-03-21 | Discharge: 2016-03-21 | Disposition: A | Discharge status: Home / Self Care | Payer: Medicaid Other | Source: Ambulatory Visit | Attending: Obstetrics & Gynecology | Admitting: Obstetrics & Gynecology

## 2016-03-21 ENCOUNTER — Inpatient Hospital Stay (HOSPITAL_COMMUNITY): Payer: Medicaid Other

## 2016-03-21 ENCOUNTER — Encounter (HOSPITAL_COMMUNITY): Payer: Self-pay | Admitting: *Deleted

## 2016-03-21 DIAGNOSIS — Z3A08 8 weeks gestation of pregnancy: Secondary | ICD-10-CM | POA: Diagnosis not present

## 2016-03-21 DIAGNOSIS — K219 Gastro-esophageal reflux disease without esophagitis: Secondary | ICD-10-CM | POA: Diagnosis not present

## 2016-03-21 DIAGNOSIS — O99331 Smoking (tobacco) complicating pregnancy, first trimester: Secondary | ICD-10-CM | POA: Insufficient documentation

## 2016-03-21 DIAGNOSIS — F17210 Nicotine dependence, cigarettes, uncomplicated: Secondary | ICD-10-CM | POA: Insufficient documentation

## 2016-03-21 DIAGNOSIS — Z7984 Long term (current) use of oral hypoglycemic drugs: Secondary | ICD-10-CM | POA: Insufficient documentation

## 2016-03-21 DIAGNOSIS — O99511 Diseases of the respiratory system complicating pregnancy, first trimester: Secondary | ICD-10-CM | POA: Diagnosis not present

## 2016-03-21 DIAGNOSIS — Z800 Family history of malignant neoplasm of digestive organs: Secondary | ICD-10-CM | POA: Insufficient documentation

## 2016-03-21 DIAGNOSIS — O99281 Endocrine, nutritional and metabolic diseases complicating pregnancy, first trimester: Secondary | ICD-10-CM | POA: Insufficient documentation

## 2016-03-21 DIAGNOSIS — F319 Bipolar disorder, unspecified: Secondary | ICD-10-CM | POA: Diagnosis not present

## 2016-03-21 DIAGNOSIS — Z888 Allergy status to other drugs, medicaments and biological substances status: Secondary | ICD-10-CM | POA: Insufficient documentation

## 2016-03-21 DIAGNOSIS — Z79899 Other long term (current) drug therapy: Secondary | ICD-10-CM | POA: Insufficient documentation

## 2016-03-21 DIAGNOSIS — O4691 Antepartum hemorrhage, unspecified, first trimester: Secondary | ICD-10-CM | POA: Diagnosis present

## 2016-03-21 DIAGNOSIS — Z833 Family history of diabetes mellitus: Secondary | ICD-10-CM | POA: Diagnosis not present

## 2016-03-21 DIAGNOSIS — E119 Type 2 diabetes mellitus without complications: Secondary | ICD-10-CM | POA: Diagnosis not present

## 2016-03-21 DIAGNOSIS — E282 Polycystic ovarian syndrome: Secondary | ICD-10-CM | POA: Diagnosis not present

## 2016-03-21 DIAGNOSIS — O99341 Other mental disorders complicating pregnancy, first trimester: Secondary | ICD-10-CM | POA: Insufficient documentation

## 2016-03-21 DIAGNOSIS — J45909 Unspecified asthma, uncomplicated: Secondary | ICD-10-CM | POA: Insufficient documentation

## 2016-03-21 DIAGNOSIS — O161 Unspecified maternal hypertension, first trimester: Secondary | ICD-10-CM | POA: Diagnosis not present

## 2016-03-21 DIAGNOSIS — O99611 Diseases of the digestive system complicating pregnancy, first trimester: Secondary | ICD-10-CM | POA: Diagnosis not present

## 2016-03-21 DIAGNOSIS — Z9889 Other specified postprocedural states: Secondary | ICD-10-CM | POA: Insufficient documentation

## 2016-03-21 DIAGNOSIS — Z825 Family history of asthma and other chronic lower respiratory diseases: Secondary | ICD-10-CM | POA: Insufficient documentation

## 2016-03-21 DIAGNOSIS — Z8249 Family history of ischemic heart disease and other diseases of the circulatory system: Secondary | ICD-10-CM | POA: Diagnosis not present

## 2016-03-21 DIAGNOSIS — O209 Hemorrhage in early pregnancy, unspecified: Secondary | ICD-10-CM

## 2016-03-21 LAB — URINALYSIS, ROUTINE W REFLEX MICROSCOPIC
Bilirubin Urine: NEGATIVE
Glucose, UA: NEGATIVE mg/dL
KETONES UR: NEGATIVE mg/dL
Nitrite: NEGATIVE
Protein, ur: NEGATIVE mg/dL
Specific Gravity, Urine: 1.019 (ref 1.005–1.030)
pH: 5 (ref 5.0–8.0)

## 2016-03-21 LAB — CBC WITH DIFFERENTIAL/PLATELET
BASOS ABS: 0.1 10*3/uL (ref 0.0–0.1)
BASOS PCT: 1 %
EOS ABS: 0.1 10*3/uL (ref 0.0–0.7)
Eosinophils Relative: 1 %
HEMATOCRIT: 35.9 % — AB (ref 36.0–46.0)
HEMOGLOBIN: 12.8 g/dL (ref 12.0–15.0)
Lymphocytes Relative: 33 %
Lymphs Abs: 2 10*3/uL (ref 0.7–4.0)
MCH: 24.5 pg — ABNORMAL LOW (ref 26.0–34.0)
MCHC: 35.7 g/dL (ref 30.0–36.0)
MCV: 68.6 fL — ABNORMAL LOW (ref 78.0–100.0)
Monocytes Absolute: 0.2 10*3/uL (ref 0.1–1.0)
Monocytes Relative: 4 %
NEUTROS ABS: 3.8 10*3/uL (ref 1.7–7.7)
NEUTROS PCT: 62 %
Platelets: 440 10*3/uL — ABNORMAL HIGH (ref 150–400)
RBC: 5.23 MIL/uL — ABNORMAL HIGH (ref 3.87–5.11)
RDW: 15 % (ref 11.5–15.5)
WBC: 6.2 10*3/uL (ref 4.0–10.5)

## 2016-03-21 LAB — ABO/RH: ABO/RH(D): B POS

## 2016-03-21 LAB — POCT PREGNANCY, URINE: PREG TEST UR: POSITIVE — AB

## 2016-03-21 LAB — GLUCOSE, CAPILLARY: Glucose-Capillary: 104 mg/dL — ABNORMAL HIGH (ref 65–99)

## 2016-03-21 LAB — HCG, QUANTITATIVE, PREGNANCY: hCG, Beta Chain, Quant, S: 12138 m[IU]/mL — ABNORMAL HIGH (ref <5)

## 2016-03-21 NOTE — MAU Note (Signed)
Pt states she had a pos HPT one week ago.  Started having cramping & bleeding yesterday.  The pain is lower abd & lower back.  Pain has been severe.

## 2016-03-21 NOTE — MAU Provider Note (Signed)
[facility] Provider Note   CSN: [id number] Arrival date & time: 03/21/16  1033     History   Chief Complaint Chief Complaint  Patient presents with  . Abdominal Pain  . Back Pain  . Vaginal Bleeding    HPI [name] is a 31 y.o. [id number] @ [redacted]w[redacted]d gestation who presents to the MAU with vaginal bleeding.  The symptoms started last night and are worse today. Patient took a HPT last week that was positive. LMP 01/20/2016. Last sexual intercourse 2 weeks ago. Sexually active with one partner x 11 years. Hx of trichomonas. No birth control. Patient reports that the bleeding is less than a period. She rates the abdominal pain as 8/10 and describes it as cramping.   The history is provided by the patient. No language interpreter was used.  Abdominal Pain  The primary symptoms of the illness include abdominal pain, nausea, vaginal discharge and vaginal bleeding. The primary symptoms of the illness do not include fever, shortness of breath, vomiting or dysuria. The current episode started yesterday. The problem has been gradually worsening.  The vaginal discharge is not associated with dysuria.   The patient states that she believes she is currently pregnant. The patient has not had a change in bowel habit. Additional symptoms associated with the illness include urgency, frequency and back pain. Symptoms associated with the illness do not include chills. Significant associated medical issues include GERD and diabetes.  Back Pain   Associated symptoms include abdominal pain. Pertinent negatives include no chest pain, no fever and no dysuria.  Vaginal Bleeding  This is a new problem. The current episode started yesterday. The problem occurs intermittently. Associated symptoms include abdominal pain and nausea. Pertinent negatives include no chest pain, chills, coughing, fever, rash or vomiting. Nothing aggravates the symptoms. She has tried nothing for the symptoms.    Past  Medical History:  Diagnosis Date  . Alcohol abuse   . Anemia   . Asthma    rarely uses inhaler  . Bipolar disorder ([place])    HX PPD after 2014 delivery, no meds  . Bronchitis   . 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 Hypertension   . Missed ab    x 2, one requiring D & E  . Pancreatic cyst   . Pancreatitis    diet controlled  . Pelvic inflammatory disease   . Plantar fasciitis   . Polycystic ovary syndrome   . Renal cyst     Patient Active Problem List   Diagnosis Date Noted  . HTN (hypertension) 08/13/2014  . Pre-eclampsia 04/29/2014  . S/P cesarean section 04/18/2014  . [redacted] weeks gestation of pregnancy   . [redacted] weeks gestation of pregnancy   . [redacted] weeks gestation of pregnancy   . [redacted] weeks gestation of pregnancy   . Diabetes mellitus in pregnancy, antepartum   . Diabetes mellitus in pregnancy in second trimester   . [redacted] weeks gestation of pregnancy   . Encounter for routine screening for malformation using ultrasonics   . 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 Vitamin D deficiency 12/29/2013  . H/O macrosomia in infant in prior pregnancy, currently pregnant 12/25/2013  . Previous cesarean delivery, antepartum 12/25/2013  . Tobacco use during pregnancy 12/25/2013  . Asthma 12/25/2013  . Pregnancy and non-insulin-dependent diabetes mellitus 05/22/2012  . Genital warts 12/22/2011  . Pancreatic pseudocyst 06/24/2010    Past Surgical History:  Procedure Laterality Date  . CESAREAN  SECTION N/A 05/23/2012   Procedure: CESAREAN SECTION;  Surgeon: Lahoma Crocker, MD;  Location: [facility];  Service: Obstetrics;  Laterality: N/A;  primary  . CESAREAN SECTION N/A 04/18/2014   Procedure: REPEAT CESAREAN SECTION;  Surgeon: Shelly Bombard, MD;  Location: [facility];  Service: Obstetrics;  Laterality: N/A;  . DILATION AND CURETTAGE OF UTERUS      for MAB  . EUS N/A 06/11/2015   Procedure: UPPER ENDOSCOPIC ULTRASOUND (EUS) RADIAL;   Surgeon: Milus Banister, MD;  Location: [facility];  Service: Endoscopy;  Laterality: N/A;  . PANCREAS SURGERY  2012   s/p partial pancreatectomy  . SPLENECTOMY, TOTAL  2012  . WISDOM TOOTH EXTRACTION      OB History    Gravida Para Term Preterm AB Living   6 2 2   3 2    SAB TAB Ectopic Multiple Live Births   2 1   0 2       Home Medications    Prior to Admission medications   Medication Sig Start Date End Date Taking? Authorizing Provider  diazepam (VALIUM) 5 MG tablet Take 2.5 mg by mouth every 8 (eight) hours as needed for muscle spasms.   Yes Historical Provider, MD  lipase/protease/amylase (CREON) 12000 units CPEP capsule Take 24,000 Units by mouth 3 (three) times daily before meals.   Yes Historical Provider, MD  metFORMIN (GLUCOPHAGE) 500 MG tablet Take 500 mg by mouth 2 (two) times daily with a meal.   Yes Historical Provider, MD  oseltamivir (TAMIFLU) 75 MG capsule Take 75 mg by mouth daily. Pt started 03/17/16 for 7 days.   Yes Historical Provider, MD  oxyCODONE-acetaminophen (PERCOCET/ROXICET) 5-325 MG tablet Take 1-2 tablets by mouth every 4 (four) hours as needed for severe pain.   Yes Historical Provider, MD  ondansetron (ZOFRAN ODT) 4 MG disintegrating tablet Take 1 tablet (4 mg total) by mouth every 8 (eight) hours as needed for nausea or vomiting. Patient not taking: Reported on 10/27/2015 09/26/15   Sherwood Gambler, MD  promethazine (PHENERGAN) 25 MG suppository Place 1 suppository (25 mg total) rectally every 6 (six) hours as needed for nausea, vomiting or refractory nausea / vomiting. Patient not taking: Reported on 10/27/2015 09/26/15   Sherwood Gambler, MD    Family History Family History  Problem Relation Age of Onset  . Diabetes Mother   . Hypertension Mother   . Asthma Father   . Colon cancer Neg Hx     Social History Social History  Substance Use Topics  . Smoking status: Current Every Day Smoker    Years: 13.00    Types: Cigarettes  . Smokeless  tobacco: Never Used     Comment: smokes 4 cigs/day, tobacco info given 04/17/15  . Alcohol use No     Comment: social      Allergies   Dilaudid [hydromorphone hcl]   Review of Systems Review of Systems  Constitutional: Negative for chills and fever.  HENT: Negative.   Eyes: Negative for visual disturbance.  Respiratory: Negative for cough, shortness of breath and wheezing.   Cardiovascular: Negative for chest pain, palpitations and leg swelling.  Gastrointestinal: Positive for abdominal pain and nausea. Negative for vomiting.  Genitourinary: Positive for frequency, urgency, vaginal bleeding and vaginal discharge. Negative for dysuria.  Musculoskeletal: Positive for back pain.  Skin: Negative for rash.  Neurological: Negative for syncope and light-headedness.  Psychiatric/Behavioral: Negative for confusion. Nervous/anxious: hx of anxiety.      Physical Exam Updated Vital Signs  BP 119/78   Pulse 89   Temp 98.4 F (36.9 C) (Oral)   Resp 18   Ht 5' (1.524 m)   Wt 160 lb (72.6 kg)   LMP 01/20/2016 (Approximate)   BMI 31.25 kg/m   Physical Exam  Constitutional: She is oriented to person, place, and time. She appears well-developed and well-nourished.  Eyes: EOM are normal.  Neck: Neck supple.  Cardiovascular: Normal rate.   Pulmonary/Chest: Effort normal.  Abdominal: Soft. There is no tenderness.  Genitourinary:  Genitourinary Comments: External genitalia without lesions. Small blood vaginal vault, cervix closed. No CMT, no adnexal tenderness. Uterus without palpable enlargement.   Musculoskeletal: Normal range of motion.  Neurological: She is alert and oriented to person, place, and time. No cranial nerve deficit.  Skin: Skin is warm and dry.  Psychiatric: She has a normal mood and affect. Her behavior is normal.  Nursing note and vitals reviewed.    ED Treatments / Results  Labs (all labs ordered are listed, but only abnormal results are displayed) Labs Reviewed   URINALYSIS, ROUTINE W REFLEX MICROSCOPIC - Abnormal; Notable for the following:       Result Value   APPearance HAZY (*)    Hgb urine dipstick MODERATE (*)    Leukocytes, UA TRACE (*)    Bacteria, UA RARE (*)    Squamous Epithelial / LPF 0-5 (*)    All other components within normal limits  CBC WITH DIFFERENTIAL/PLATELET - Abnormal; Notable for the following:    RBC 5.23 (*)    HCT 35.9 (*)    MCV 68.6 (*)    MCH 24.5 (*)    Platelets 440 (*)    All other components within normal limits  GLUCOSE, CAPILLARY - Abnormal; Notable for the following:    Glucose-Capillary 104 (*)    All other components within normal limits  HCG, QUANTITATIVE, PREGNANCY - Abnormal; Notable for the following:    hCG, Beta Chain, Quant, S 12,138 (*)    All other components within normal limits  POCT PREGNANCY, URINE - Abnormal; Notable for the following:    Preg Test, Ur POSITIVE (*)    All other components within normal limits  RPR  HIV ANTIBODY (ROUTINE TESTING)  ABO/RH  GC/CHLAMYDIA PROBE AMP ([place]) NOT AT [facility]    Radiology US Ob Comp Less 14 Wks  Result Date: 03/21/2016 CLINICAL DATA:  Pelvic pain and vaginal bleeding. Gestational age by LMP of 8 weeks 5 days. EXAM: OBSTETRIC <14 WK [place] AND TRANSVAGINAL OB US TECHNIQUE: Both transabdominal and transvaginal ultrasound examinations were performed for complete evaluation of the gestation as well as the maternal uterus, adnexal regions, and pelvic cul-de-sac. Transvaginal technique was performed to assess early pregnancy. COMPARISON:  None. FINDINGS: Intrauterine gestational sac: Probable single, abnormal appearing intrauterine gestational sac Yolk sac:  Not Visualized. Embryo:  Not Visualized. MSD: 12  mm   5 w   6  d Subchorionic hemorrhage: Probable small subchorionic hemorrhage in lower uterine segment. Maternal uterus/adnexae: Normal appearance of both ovaries. No adnexal mass or free fluid identified. IMPRESSION: Probable single early  intrauterine gestational sac with abnormal appearance, and small subchorionic hemorrhage. Recommend close followup of quantitative HCG levels, and follow-up ultrasound in 10-14 days if clinically warranted. Electronically Signed   By: Earle Gell M.D.   On: 03/21/2016 15:03    Procedures Procedures (including critical care time)  Medications Ordered in ED Medications - No data to display   Initial Impression / Assessment and Plan /  ED Course  I have reviewed the triage vital signs and the nursing notes.  Pertinent labs & imaging results that were available during my care of the patient were reviewed by me and considered in my medical decision making (see chart for details).  Clinical Course    31 y.o. female with vaginal bleeding in early pregnancy stable for d/c without acute abdomen or hemorrhage. Discussed with the patient clinical lab and u/s findings and all questioned fully answered. She will return if any problems arise.  Close f/u with repeat Bhcg in 2 days. Strict return precautions.   Final Clinical Impressions(s) / ED Diagnoses   Final diagnoses:  Vaginal bleeding in pregnancy, first trimester    New Prescriptions Discharge Medication List as of 03/21/2016  3:34 PM

## 2016-03-21 NOTE — MAU Note (Signed)
Pt did fall yesterday and landed on her bottom.

## 2016-03-21 NOTE — Discharge Instructions (Signed)
Vaginal Bleeding During Pregnancy, First Trimester A small amount of bleeding (spotting) from the vagina is common in early pregnancy. Sometimes the bleeding is normal and is not a problem, and sometimes it is a sign of something serious. Be sure to tell your doctor about any bleeding from your vagina right away. Follow these instructions at home:  Watch your condition for any changes.  Follow your doctor's instructions about how active you can be.  If you are on bed rest:  You may need to stay in bed and only get up to use the bathroom.  You may be allowed to do some activities.  If you need help, make plans for someone to help you.  Write down:  The number of pads you use each day.  How often you change pads.  How soaked (saturated) your pads are.  Do not use tampons.  Do not douche.  Do not have sex or orgasms until your doctor says it is okay.  If you pass any tissue from your vagina, save the tissue so you can show it to your doctor.  Only take medicines as told by your doctor.  Do not take aspirin because it can make you bleed.  Keep all follow-up visits as told by your doctor. Contact a doctor if:  You bleed from your vagina.  You have cramps.  You have labor pains.  You have a fever that does not go away after you take medicine. Get help right away if:  You have very bad cramps in your back or belly (abdomen).  You pass large clots or tissue from your vagina.  You bleed more.  You feel light-headed or weak.  You pass out (faint).  You have chills.  You are leaking fluid or have a gush of fluid from your vagina.  You pass out while pooping (having a bowel movement). This information is not intended to replace advice given to you by your health care provider. Make sure you discuss any questions you have with your health care provider. Document Released: 07/08/2013 Document Revised: 07/30/2015 Document Reviewed: 10/29/2012 Elsevier Interactive  Patient Education  2017 [place] will need to return on Wednesday to repeat the blood work to see if the pregnancy hormone level is rising normally. Return sooner for any problems.

## 2016-03-22 LAB — HIV ANTIBODY (ROUTINE TESTING W REFLEX): HIV SCREEN 4TH GENERATION: NONREACTIVE

## 2016-03-22 LAB — RPR: RPR: NONREACTIVE

## 2016-03-22 LAB — GC/CHLAMYDIA PROBE AMP (~~LOC~~) NOT AT ARMC
CHLAMYDIA, DNA PROBE: NEGATIVE
NEISSERIA GONORRHEA: NEGATIVE

## 2016-03-25 ENCOUNTER — Inpatient Hospital Stay (HOSPITAL_COMMUNITY)
Admission: AD | Admit: 2016-03-25 | Discharge: 2016-03-25 | Disposition: A | Discharge status: Home / Self Care | Payer: Medicaid Other | Source: Ambulatory Visit | Attending: Obstetrics and Gynecology | Admitting: Obstetrics and Gynecology

## 2016-03-25 ENCOUNTER — Inpatient Hospital Stay (HOSPITAL_COMMUNITY): Payer: Medicaid Other

## 2016-03-25 ENCOUNTER — Other Ambulatory Visit: Payer: Medicaid Other

## 2016-03-25 ENCOUNTER — Encounter (HOSPITAL_COMMUNITY): Payer: Self-pay

## 2016-03-25 ENCOUNTER — Telehealth: Payer: Self-pay

## 2016-03-25 DIAGNOSIS — Z888 Allergy status to other drugs, medicaments and biological substances status: Secondary | ICD-10-CM | POA: Diagnosis not present

## 2016-03-25 DIAGNOSIS — O99511 Diseases of the respiratory system complicating pregnancy, first trimester: Secondary | ICD-10-CM | POA: Insufficient documentation

## 2016-03-25 DIAGNOSIS — Z825 Family history of asthma and other chronic lower respiratory diseases: Secondary | ICD-10-CM | POA: Diagnosis not present

## 2016-03-25 DIAGNOSIS — Z9889 Other specified postprocedural states: Secondary | ICD-10-CM | POA: Diagnosis not present

## 2016-03-25 DIAGNOSIS — Z8249 Family history of ischemic heart disease and other diseases of the circulatory system: Secondary | ICD-10-CM | POA: Insufficient documentation

## 2016-03-25 DIAGNOSIS — F17210 Nicotine dependence, cigarettes, uncomplicated: Secondary | ICD-10-CM | POA: Diagnosis not present

## 2016-03-25 DIAGNOSIS — O99341 Other mental disorders complicating pregnancy, first trimester: Secondary | ICD-10-CM | POA: Insufficient documentation

## 2016-03-25 DIAGNOSIS — O99281 Endocrine, nutritional and metabolic diseases complicating pregnancy, first trimester: Secondary | ICD-10-CM | POA: Insufficient documentation

## 2016-03-25 DIAGNOSIS — O99331 Smoking (tobacco) complicating pregnancy, first trimester: Secondary | ICD-10-CM | POA: Diagnosis not present

## 2016-03-25 DIAGNOSIS — O161 Unspecified maternal hypertension, first trimester: Secondary | ICD-10-CM | POA: Diagnosis not present

## 2016-03-25 DIAGNOSIS — O26899 Other specified pregnancy related conditions, unspecified trimester: Secondary | ICD-10-CM

## 2016-03-25 DIAGNOSIS — Z7984 Long term (current) use of oral hypoglycemic drugs: Secondary | ICD-10-CM | POA: Diagnosis not present

## 2016-03-25 DIAGNOSIS — O3680X0 Pregnancy with inconclusive fetal viability, not applicable or unspecified: Secondary | ICD-10-CM

## 2016-03-25 DIAGNOSIS — Z833 Family history of diabetes mellitus: Secondary | ICD-10-CM | POA: Insufficient documentation

## 2016-03-25 DIAGNOSIS — Z79899 Other long term (current) drug therapy: Secondary | ICD-10-CM | POA: Insufficient documentation

## 2016-03-25 DIAGNOSIS — O24911 Unspecified diabetes mellitus in pregnancy, first trimester: Secondary | ICD-10-CM | POA: Insufficient documentation

## 2016-03-25 DIAGNOSIS — Z3A09 9 weeks gestation of pregnancy: Secondary | ICD-10-CM | POA: Diagnosis not present

## 2016-03-25 DIAGNOSIS — O200 Threatened abortion: Secondary | ICD-10-CM | POA: Diagnosis present

## 2016-03-25 DIAGNOSIS — R109 Unspecified abdominal pain: Secondary | ICD-10-CM

## 2016-03-25 DIAGNOSIS — O209 Hemorrhage in early pregnancy, unspecified: Secondary | ICD-10-CM

## 2016-03-25 LAB — CBC
HCT: 33.5 % — ABNORMAL LOW (ref 36.0–46.0)
Hemoglobin: 11.9 g/dL — ABNORMAL LOW (ref 12.0–15.0)
MCH: 24.2 pg — ABNORMAL LOW (ref 26.0–34.0)
MCHC: 35.5 g/dL (ref 30.0–36.0)
MCV: 68.1 fL — ABNORMAL LOW (ref 78.0–100.0)
PLATELETS: 444 10*3/uL — AB (ref 150–400)
RBC: 4.92 MIL/uL (ref 3.87–5.11)
RDW: 14.9 % (ref 11.5–15.5)
WBC: 6.6 10*3/uL (ref 4.0–10.5)

## 2016-03-25 LAB — HCG, QUANTITATIVE, PREGNANCY: hCG, Beta Chain, Quant, S: 8109 m[IU]/mL — ABNORMAL HIGH (ref <5)

## 2016-03-25 MED ORDER — OXYCODONE-ACETAMINOPHEN 5-325 MG PO TABS: 1.0000 | ORAL_TABLET | Freq: Four times a day (QID) | ORAL | 0 refills | Status: DC | PRN

## 2016-03-25 MED ORDER — OXYCODONE-ACETAMINOPHEN 5-325 MG PO TABS
2.0000 | ORAL_TABLET | Freq: Once | ORAL | Status: AC
  Administered 2016-03-25: 2 via ORAL
  Filled 2016-03-25: qty 2

## 2016-03-25 NOTE — MAU Provider Note (Signed)
History     CSN: [id number]  Arrival date and time: 03/25/16 1016   First Provider Initiated Contact with Patient 03/25/16 1044      Chief Complaint  Patient presents with  . Vaginal Bleeding  . Abdominal Pain   HPI [name] is a 31 y.o. [id number] at [redacted]w[redacted]d who presents with vaginal bleeding and abdominal pain. Was previously seen in MAU on 1/15; had a BHCG of 12,138 & an ultrasound that showed possible IUGS & [facility]. Was presented to [facility] this morning for f/u BHCG & was sent here for evaluation due to pain and bleeding.  Patient reports vaginal bleeding since Tuesday that has increased in the last 24 hours. Reports passing large clots & occasionally saturating pads. Has changed pad 3 times today; none saturated. Abdominal pain is worse today. Rates pain 9/10. Has not treated pain. Describes as sharp & cramp like. Nothing makes pain better or worse.   OB History    Gravida Para Term Preterm AB Living   6 2 2   3 2    SAB TAB Ectopic Multiple Live Births   2 1   0 2      Past Medical History:  Diagnosis Date  . Alcohol abuse   . Anemia   . Asthma    rarely uses inhaler  . Bipolar disorder ([place])    HX PPD after 2014 delivery, no meds  . Bronchitis   . 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 Hypertension   . Missed ab    x 2, one requiring D & E  . Pancreatic cyst   . Pancreatitis    diet controlled  . Pelvic inflammatory disease   . Plantar fasciitis   . Polycystic ovary syndrome   . Renal cyst     Past Surgical History:  Procedure Laterality Date  . CESAREAN SECTION N/A 05/23/2012   Procedure: CESAREAN SECTION;  Surgeon: Lahoma Crocker, MD;  Location: [place] [facility];  Service: Obstetrics;  Laterality: N/A;  primary  . CESAREAN SECTION N/A 04/18/2014   Procedure: REPEAT CESAREAN SECTION;  Surgeon: Shelly Bombard, MD;  Location: [facility];  Service: Obstetrics;  Laterality: N/A;  . DILATION AND CURETTAGE OF UTERUS      for MAB  . EUS N/A 06/11/2015   Procedure: UPPER ENDOSCOPIC ULTRASOUND (EUS) RADIAL;  Surgeon: Milus Banister, MD;  Location: [facility];  Service: Endoscopy;  Laterality: N/A;  . PANCREAS SURGERY  2012   s/p partial pancreatectomy  . SPLENECTOMY, TOTAL  2012  . WISDOM TOOTH EXTRACTION      Family History  Problem Relation Age of Onset  . Diabetes Mother   . Hypertension Mother   . Asthma Father   . Colon cancer Neg Hx     Social History  Substance Use Topics  . Smoking status: Current Every Day Smoker    Years: 13.00    Types: Cigarettes  . Smokeless tobacco: Never Used     Comment: smokes 4 cigs/day, tobacco info given 04/17/15  . Alcohol use No     Comment: social     Allergies:  Allergies  Allergen Reactions  . Dilaudid [Hydromorphone Hcl] Itching and Other (See Comments)    Reaction:  Hallucinations    Prescriptions Prior to Admission  Medication Sig Dispense Refill Last Dose  . acetaminophen (TYLENOL) 500 MG tablet Take 500 mg by mouth every 6 (six) hours as needed for moderate pain.   03/23/2016  .  ibuprofen (ADVIL,MOTRIN) 200 MG tablet Take 600 mg by mouth every 6 (six) hours as needed for moderate pain or cramping.   03/24/2016 at Unknown time  . lipase/protease/amylase (CREON) 12000 units CPEP capsule Take 24,000 Units by mouth 3 (three) times daily before meals.   03/23/2016 at Unknown time  . metFORMIN (GLUCOPHAGE) 500 MG tablet Take 500 mg by mouth 2 (two) times daily with a meal.   03/24/2016 at Unknown time  . oxyCODONE-acetaminophen (PERCOCET/ROXICET) 5-325 MG tablet Take 1-2 tablets by mouth every 4 (four) hours as needed for severe pain.   Past Month at Unknown time  . ondansetron (ZOFRAN ODT) 4 MG disintegrating tablet Take 1 tablet (4 mg total) by mouth every 8 (eight) hours as needed for nausea or vomiting. (Patient not taking: Reported on 10/27/2015) 10 tablet 0 Not Taking  . oseltamivir (TAMIFLU) 75 MG capsule Take 75 mg by mouth daily. Pt started 03/17/16  for 7 days.  03/25/16-pt is now finished.   03/22/2016  . promethazine (PHENERGAN) 25 MG suppository Place 1 suppository (25 mg total) rectally every 6 (six) hours as needed for nausea, vomiting or refractory nausea / vomiting. (Patient not taking: Reported on 10/27/2015) 4 each 0 Not Taking    Review of Systems  Constitutional: Negative for chills and fever.  Gastrointestinal: Positive for abdominal pain and nausea. Negative for constipation and vomiting.  Genitourinary: Positive for vaginal bleeding. Negative for dysuria and vaginal discharge.  Neurological: Negative for dizziness.   Physical Exam   Blood pressure 114/76, pulse 88, temperature 98.5 F (36.9 C), temperature source Oral, resp. rate 20, weight 159 lb 12 oz (72.5 kg), last menstrual period 01/20/2016, not currently breastfeeding.  Physical Exam  Nursing note and vitals reviewed. Constitutional: She is oriented to person, place, and time. She appears well-developed and well-nourished. No distress.  HENT:  Head: Normocephalic and atraumatic.  Eyes: Conjunctivae are normal. Right eye exhibits no discharge. Left eye exhibits no discharge. No scleral icterus.  Neck: Normal range of motion.  Cardiovascular: Normal rate, regular rhythm and normal heart sounds.   No murmur heard. Respiratory: Effort normal and breath sounds normal. No respiratory distress. She has no wheezes.  GI: Soft. Bowel sounds are normal. She exhibits no distension. There is tenderness in the suprapubic area and left lower quadrant. There is no rebound and no guarding.  Genitourinary: Uterus is enlarged. Uterus is not tender. Cervix exhibits no friability. There is bleeding (moderate amount of dark red blood & 1 small clot cleared out with 3 fox swabs) in the vagina.  Genitourinary Comments: Cervix closed  Neurological: She is alert and oriented to person, place, and time.  Skin: Skin is warm and dry. She is not diaphoretic.  Psychiatric: She has a normal mood  and affect. Her behavior is normal. Judgment and thought content normal.    MAU Course  Procedures Results for orders placed or performed during the hospital encounter of 03/25/16 (from the past 24 hour(s))  CBC     Status: Abnormal   Collection Time: 03/25/16 11:02 AM  Result Value Ref Range   WBC 6.6 4.0 - 10.5 K/uL   RBC 4.92 3.87 - 5.11 MIL/uL   Hemoglobin 11.9 (L) 12.0 - 15.0 g/dL   HCT 33.5 (L) 36.0 - 46.0 %   MCV 68.1 (L) 78.0 - 100.0 fL   MCH 24.2 (L) 26.0 - 34.0 pg   MCHC 35.5 30.0 - 36.0 g/dL   RDW 14.9 11.5 - 15.5 %   Platelets  444 (H) 150 - 400 K/uL  hCG, quantitative, pregnancy     Status: Abnormal   Collection Time: 03/25/16 11:02 AM  Result Value Ref Range   hCG, Beta Chain, Quant, S 8,109 (H) <5 mIU/mL   US Ob Transvaginal  Result Date: 03/25/2016 CLINICAL DATA:  First trimester bleeding. EXAM: TRANSVAGINAL OB ULTRASOUND TECHNIQUE: Transvaginal ultrasound was performed for complete evaluation of the gestation as well as the maternal uterus, adnexal regions, and pelvic cul-de-sac. COMPARISON:  03/21/2016 FINDINGS: Intrauterine gestational sac: Single Yolk sac:  Not Visualized. Embryo:  Not Visualized. Cardiac Activity: Not Visualized. MSD: 8.3  mm   5 w   4  d Maternal uterus/adnexae: Subchorionic hemorrhage: None Right ovary: Normal Left ovary: Normal Other :None Free fluid:  None IMPRESSION: 1. Probable early intrauterine gestational sac, but no yolk sac, fetal pole, or cardiac activity yet visualized. Recommend follow-up quantitative B-HCG levels and follow-up US in 14 days to assess viability. This recommendation follows SRU consensus guidelines: Diagnostic Criteria for Nonviable Pregnancy Early in the First Trimester. [employer] Med [id number]. Electronically Signed   By: [name]   On: 03/25/2016 12:11    MDM CBC, BHCG, u/a VSS, hemoglobin stable Cervix closed Ultrasound shows ?IUGS that is smaller in size than previous study & BHCG has dropped to  8,109. These results likely indicate failed pregnancy.  Discussed assessment & results with Dr. Ilda Basset. Will offer patient cytotec & have her f/u in [facility] for miscarriage appointment & ultrasound.   Discussed options for management of incomplete AB including expectant management, Cytotec or D&C. Prefers expectant management at this time. Verbalizes understanding that intervention may become necessary if SAB in not completed spontaneously or if heavy bleeding or infection occur.  Offer spiritual care services; pt declines at this time.   Percocet 2 tabs PO prior to discharge Assessment and Plan  A: 1. Threatened miscarriage   2. Vaginal bleeding in pregnancy, first trimester   3. Abdominal pain affecting pregnancy    P: Discharge home Rx percocet Discussed reasons to return to MAU for worsening symptoms Msg to [facility] for miscarriage f/u appt  Jorje Guild 03/25/2016, 10:41 AM

## 2016-03-25 NOTE — Telephone Encounter (Signed)
Patient presented to Office today for a repeat quant. Patient stated she is passing large amounts of blood clots. She is having to change her pad every hour due to bleeding. Patient reports pain scale of 8 today. Blood was collected for repeat quant. Per provider Dr.Stinson patient needs to report back to MAU. Patient was advised to report to MAU to follow up for her bleeding. Patient verbalizes understanding.

## 2016-03-25 NOTE — MAU Note (Signed)
Came on Monday, for bleeding and cramping.  Did an [place]. Tues bleeding got heavier, started passing clots on Wed. Had blood work done downstairs, was told to come up here.  Pain is unbearable.

## 2016-03-25 NOTE — Discharge Instructions (Signed)
Return to care  °· If you have heavier bleeding that soaks through more that 2 pads per hour for an hour or more °· If you bleed so much that you feel like you might pass out or you do pass out °· If you have significant abdominal pain that is not improved with Tylenol  °· If you develop a fever > 100.5 ° ° ° ° °Miscarriage °A miscarriage is the sudden loss of an unborn baby (fetus) before the 20th week of pregnancy. Most miscarriages happen in the first 3 months of pregnancy. Sometimes, it happens before a woman even knows she is pregnant. A miscarriage is also called a "spontaneous miscarriage" or "early pregnancy loss." Having a miscarriage can be an emotional experience. Talk with your caregiver about any questions you may have about miscarrying, the grieving process, and your future pregnancy plans. °What are the causes? °· Problems with the fetal chromosomes that make it impossible for the baby to develop normally. Problems with the baby's genes or chromosomes are most often the result of errors that occur, by chance, as the embryo divides and grows. The problems are not inherited from the parents. °· Infection of the cervix or uterus. °· Hormone problems. °· Problems with the cervix, such as having an incompetent cervix. This is when the tissue in the cervix is not strong enough to hold the pregnancy. °· Problems with the uterus, such as an abnormally shaped uterus, uterine fibroids, or congenital abnormalities. °· Certain medical conditions. °· Smoking, drinking alcohol, or taking illegal drugs. °· Trauma. °Often, the cause of a miscarriage is unknown. °What are the signs or symptoms? °· Vaginal bleeding or spotting, with or without cramps or pain. °· Pain or cramping in the abdomen or lower back. °· Passing fluid, tissue, or blood clots from the vagina. °How is this diagnosed? °Your caregiver will perform a physical exam. You may also have an ultrasound to confirm the miscarriage. Blood or urine tests may  also be ordered. °How is this treated? °· Sometimes, treatment is not necessary if you naturally pass all the fetal tissue that was in the uterus. If some of the fetus or placenta remains in the body (incomplete miscarriage), tissue left behind may become infected and must be removed. Usually, a dilation and curettage (D and C) procedure is performed. During a D and C procedure, the cervix is widened (dilated) and any remaining fetal or placental tissue is gently removed from the uterus. °· Antibiotic medicines are prescribed if there is an infection. Other medicines may be given to reduce the size of the uterus (contract) if there is a lot of bleeding. °· If you have Rh negative blood and your baby was Rh positive, you will need a Rh immunoglobulin shot. This shot will protect any future baby from having Rh blood problems in future pregnancies. °Follow these instructions at home: °· Your caregiver may order bed rest or may allow you to continue light activity. Resume activity as directed by your caregiver. °· Have someone help with home and family responsibilities during this time. °· Keep track of the number of sanitary pads you use each day and how soaked (saturated) they are. Write down this information. °· Do not use tampons. Do not douche or have sexual intercourse until approved by your caregiver. °· Only take over-the-counter or prescription medicines for pain or discomfort as directed by your caregiver. °· Do not take aspirin. Aspirin can cause bleeding. °· Keep all follow-up appointments with your caregiver. °·   If you or your partner have problems with grieving, talk to your caregiver or seek counseling to help cope with the pregnancy loss. Allow enough time to grieve before trying to get pregnant again. °Get help right away if: °· You have severe cramps or pain in your back or abdomen. °· You have a fever. °· You pass large blood clots (walnut-sized or larger) or tissue from your vagina. Save any tissue  for your caregiver to inspect. °· Your bleeding increases. °· You have a thick, bad-smelling vaginal discharge. °· You become lightheaded, weak, or you faint. °· You have chills. °This information is not intended to replace advice given to you by your health care provider. Make sure you discuss any questions you have with your health care provider. °Document Released: 08/17/2000 Document Revised: 07/30/2015 Document Reviewed: 04/12/2011 °Elsevier Interactive Patient Education © 2017 Elsevier Inc. ° °

## 2016-03-25 NOTE — MAU Note (Signed)
Patient was offered to have Spiritual Care come and she her she declined, also desires to let her body miscarry on its on, notified Jorje Guild CNM

## 2016-03-26 LAB — HCG, QUANTITATIVE, PREGNANCY: hCG, Beta Chain, Quant, S: 8355.8 m[IU]/mL — ABNORMAL HIGH

## 2016-03-31 ENCOUNTER — Telehealth: Payer: Self-pay | Admitting: *Deleted

## 2016-03-31 NOTE — Telephone Encounter (Signed)
Pt left message stating that she was seen last week for possible miscarriage. She stated that she was told she would be called with appt in our office and has not been called yet. Also, she passed the gestational sac last Friday and is continuing to have heavy bleeding. At times, the pads fill up quickly and she has to use double pads. She is also passing clots. I returned call to pt and advised that she go to MAU for evaluation. Pt stated that she does not have anyone to watch her 2 children until tomorrow morning when their father gets off work. I advised pt to come to hospital tomorrow morning or sooner if she is able to arrange childcare. Pt currently denies dizziness or weakness. I advised that she should seek medical attention immediately if these symptoms occur. Pt voiced understanding of all instructions given.

## 2016-04-05 ENCOUNTER — Telehealth: Payer: Self-pay | Admitting: *Deleted

## 2016-04-05 NOTE — Telephone Encounter (Signed)
Called pt and tried to schedule her for Thursday 04/07/2016  And pt stated her Medicaid is not active an that she will call her worker and I let her know she really needs this follow up and to call back to schedule once she gets her insurance staigtened out.Jennifer KitchenMarland [name]

## 2016-04-07 ENCOUNTER — Ambulatory Visit: Payer: Self-pay | Admitting: Obstetrics

## 2016-04-12 ENCOUNTER — Ambulatory Visit: Payer: Self-pay | Admitting: Obstetrics

## 2016-04-25 ENCOUNTER — Encounter: Payer: Self-pay | Admitting: Obstetrics

## 2016-04-25 ENCOUNTER — Ambulatory Visit (INDEPENDENT_AMBULATORY_CARE_PROVIDER_SITE_OTHER): Payer: PRIVATE HEALTH INSURANCE | Admitting: Obstetrics

## 2016-04-25 VITALS — BP 126/88 | HR 83 | Wt 155.8 lb

## 2016-04-25 DIAGNOSIS — N939 Abnormal uterine and vaginal bleeding, unspecified: Secondary | ICD-10-CM

## 2016-04-25 DIAGNOSIS — Z3009 Encounter for other general counseling and advice on contraception: Secondary | ICD-10-CM

## 2016-04-25 DIAGNOSIS — Z3202 Encounter for pregnancy test, result negative: Secondary | ICD-10-CM | POA: Diagnosis not present

## 2016-04-25 DIAGNOSIS — O039 Complete or unspecified spontaneous abortion without complication: Secondary | ICD-10-CM

## 2016-04-25 LAB — POCT URINE PREGNANCY: PREG TEST UR: NEGATIVE

## 2016-04-25 NOTE — Progress Notes (Signed)
Patient ID: [name], female   DOB: 04-18-1985, 31 y.o.   MRN: [id number]  Chief Complaint  Patient presents with  . Gynecologic Exam    HPI [name] is a 31 y.o. female.  S/P spontaneous abortion, complete, 4 weeks ago.  Has had vaginal bleeding since then, until today there is no bleeding or cramping.  She would like to discuss contraception. HPI  Past Medical History:  Diagnosis Date  . Alcohol abuse   . Anemia   . Asthma    rarely uses inhaler  . Bipolar disorder ([place])    HX PPD after 2014 delivery, no meds  . Bronchitis   . 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 Hypertension   . Missed ab    x 2, one requiring D & E  . Pancreatic cyst   . Pancreatitis    diet controlled  . Pelvic inflammatory disease   . Plantar fasciitis   . Polycystic ovary syndrome   . Renal cyst     Past Surgical History:  Procedure Laterality Date  . CESAREAN SECTION N/A 05/23/2012   Procedure: CESAREAN SECTION;  Surgeon: Lahoma Crocker, MD;  Location: [facility];  Service: Obstetrics;  Laterality: N/A;  primary  . CESAREAN SECTION N/A 04/18/2014   Procedure: REPEAT CESAREAN SECTION;  Surgeon: Shelly Bombard, MD;  Location: [facility];  Service: Obstetrics;  Laterality: N/A;  . DILATION AND CURETTAGE OF UTERUS      for MAB  . EUS N/A 06/11/2015   Procedure: UPPER ENDOSCOPIC ULTRASOUND (EUS) RADIAL;  Surgeon: Milus Banister, MD;  Location: [facility];  Service: Endoscopy;  Laterality: N/A;  . PANCREAS SURGERY  2012   s/p partial pancreatectomy  . SPLENECTOMY, TOTAL  2012  . WISDOM TOOTH EXTRACTION      Family History  Problem Relation Age of Onset  . Diabetes Mother   . Hypertension Mother   . Asthma Father   . Colon cancer Neg Hx     Social History Social History  Substance Use Topics  . Smoking status: Current Every Day Smoker    Packs/day: 0.50    Years: 13.00    Types: Cigarettes  . Smokeless tobacco: Never  Used     Comment: smokes 4 cigs/day, tobacco info given 04/17/15  . Alcohol use No     Comment: social     Allergies  Allergen Reactions  . Dilaudid [Hydromorphone Hcl] Itching and Other (See Comments)    Reaction:  Hallucinations    Current Outpatient Prescriptions  Medication Sig Dispense Refill  . acetaminophen (TYLENOL) 500 MG tablet Take 500 mg by mouth every 6 (six) hours as needed for moderate pain.    Marland Kitchen ibuprofen (ADVIL,MOTRIN) 200 MG tablet Take 600 mg by mouth every 6 (six) hours as needed for moderate pain or cramping.    . lipase/protease/amylase (CREON) 12000 units CPEP capsule Take 24,000 Units by mouth 3 (three) times daily before meals.    . metFORMIN (GLUCOPHAGE) 500 MG tablet Take 500 mg by mouth 2 (two) times daily with a meal.    . oxyCODONE-acetaminophen (PERCOCET/ROXICET) 5-325 MG tablet Take 1-2 tablets by mouth every 6 (six) hours as needed. 30 tablet 0  . ondansetron (ZOFRAN ODT) 4 MG disintegrating tablet Take 1 tablet (4 mg total) by mouth every 8 (eight) hours as needed for nausea or vomiting. (Patient not taking: Reported on 10/27/2015) 10 tablet 0  . promethazine (PHENERGAN) 25 MG suppository Place  1 suppository (25 mg total) rectally every 6 (six) hours as needed for nausea, vomiting or refractory nausea / vomiting. (Patient not taking: Reported on 10/27/2015) 4 each 0   No current facility-administered medications for this visit.     Review of Systems Review of Systems Constitutional: negative for fatigue and weight loss Respiratory: negative for cough and wheezing Cardiovascular: negative for chest pain, fatigue and palpitations Gastrointestinal: negative for abdominal pain and change in bowel habits Genitourinary:positive for vaginal bleeding after SAB Integument/breast: negative for nipple discharge Musculoskeletal:negative for myalgias Neurological: negative for gait problems and tremors Behavioral/Psych: negative for abusive relationship,  depression Endocrine: negative for temperature intolerance      Blood pressure 126/88, pulse 83, weight 155 lb 12.8 oz (70.7 kg), last menstrual period 01/20/2016.  Physical Exam Physical Exam General:   alert  Skin:   no rash or abnormalities  Lungs:   clear to auscultation bilaterally  Heart:   regular rate and rhythm, S1, S2 normal, no murmur, click, rub or gallop  Breasts:   normal without suspicious masses, skin or nipple changes or axillary nodes  Abdomen:  normal findings: no organomegaly, soft, non-tender and no hernia  Pelvis:  External genitalia: normal general appearance Urinary system: urethral meatus normal and bladder without fullness, nontender Vaginal: normal without tenderness, induration or masses Cervix: normal appearance Adnexa: normal bimanual exam Uterus: anteverted and non-tender, normal size    50% of 15 min visit spent on counseling and coordination of care.    Data Reviewed UPT:  Negative  Assessment     S/P Spontaneous Abortion, complete.  Doing well at 4 weeks. Contraceptive Counseling and Advice.  Considering Tubal Sterilization.    Plan    Pack of OCP's dispensed for interim contraception. F/U in 2 weeks for Tubal Consult.  Orders Placed This Encounter  Procedures  . POCT urine pregnancy   No orders of the defined types were placed in this encounter.        Patient ID: [name], female   DOB: 1985-11-18, 31 y.o.   MRN: [id number]

## 2016-04-25 NOTE — Progress Notes (Signed)
Patient is in the office for follow up to miscarriage in January. She was seen at [facility] 1/17. Patient has continued to bleed since that visit. Patient had pain at the beginning- she missed 2-3 weeks of work.

## 2016-05-09 ENCOUNTER — Ambulatory Visit: Payer: PRIVATE HEALTH INSURANCE | Admitting: Obstetrics and Gynecology

## 2016-05-09 ENCOUNTER — Encounter: Payer: Self-pay | Admitting: Obstetrics and Gynecology

## 2016-05-09 NOTE — Progress Notes (Signed)
Patient did not keep GYN appointment for 05/09/2016.  Durene Romans MD Attending Center for [employer] farm manager)

## 2016-07-06 ENCOUNTER — Ambulatory Visit (INDEPENDENT_AMBULATORY_CARE_PROVIDER_SITE_OTHER): Payer: PRIVATE HEALTH INSURANCE | Admitting: Obstetrics

## 2016-07-06 VITALS — BP 121/78 | HR 88 | Wt 151.9 lb

## 2016-07-06 DIAGNOSIS — Z3202 Encounter for pregnancy test, result negative: Secondary | ICD-10-CM | POA: Diagnosis not present

## 2016-07-06 DIAGNOSIS — Z113 Encounter for screening for infections with a predominantly sexual mode of transmission: Secondary | ICD-10-CM

## 2016-07-06 DIAGNOSIS — N898 Other specified noninflammatory disorders of vagina: Secondary | ICD-10-CM

## 2016-07-06 LAB — POCT URINE PREGNANCY: Preg Test, Ur: NEGATIVE

## 2016-07-06 NOTE — Progress Notes (Signed)
Patient presents for STD Self swab, she is having a discharge with itching; and UPT. LMP 05/20/16.  Patient self swabed and cultures sent to the lab.

## 2016-07-07 LAB — CERVICOVAGINAL ANCILLARY ONLY
Bacterial vaginitis: NEGATIVE
Candida vaginitis: POSITIVE — AB
Chlamydia: NEGATIVE
Neisseria Gonorrhea: NEGATIVE
TRICH (WINDOWPATH): POSITIVE — AB

## 2016-07-08 ENCOUNTER — Other Ambulatory Visit: Payer: Self-pay

## 2016-07-08 MED ORDER — METRONIDAZOLE 500 MG PO TABS: ORAL_TABLET | ORAL | 0 refills | Status: DC

## 2016-07-08 MED ORDER — FLUCONAZOLE 150 MG PO TABS: 150.0000 mg | ORAL_TABLET | Freq: Once | ORAL | 0 refills | Status: AC

## 2016-07-11 ENCOUNTER — Encounter: Payer: Self-pay | Admitting: *Deleted

## 2016-08-02 ENCOUNTER — Other Ambulatory Visit: Payer: PRIVATE HEALTH INSURANCE

## 2016-08-08 ENCOUNTER — Other Ambulatory Visit (INDEPENDENT_AMBULATORY_CARE_PROVIDER_SITE_OTHER): Payer: PRIVATE HEALTH INSURANCE | Admitting: Obstetrics & Gynecology

## 2016-08-08 ENCOUNTER — Encounter: Payer: Self-pay | Admitting: Obstetrics & Gynecology

## 2016-08-08 VITALS — BP 129/96 | HR 74 | Wt 151.0 lb

## 2016-08-08 DIAGNOSIS — A599 Trichomoniasis, unspecified: Secondary | ICD-10-CM | POA: Diagnosis not present

## 2016-08-08 NOTE — Patient Instructions (Signed)
Trichomonas Test Why am I having this test? The trichomonas test is done to diagnose trichomoniasis, an infection caused by an organism called Trichomonas. Trichomoniasis is a sexually transmitted infection (STI). In women, it causes vaginal infections. In men, it can cause the tube that carries urine (urethra) to become inflamed (urethritis). You may have this test as a part of a routine screening for STIs or if you have symptoms of trichomoniasis. To perform the test, your health care provider will take a sample of discharge. The sample is taken from the vagina or cervix in women and from the urethra in men. A urine sample can also be used for testing. What do the results mean? It is your responsibility to obtain your test results. Ask the lab or department performing the test when and how you will get your results. Contact your health care provider to discuss any questions you have about your results. Negative test results A negative test means you do not have trichomoniasis. Follow your health care provider's directions about any follow-up testing. Positive test results A positive test result means you have an active infection that needs to be treated with antibiotic medicine. All your current sexual partners must also be treated or it is likely you will get reinfected. If your test is positive, your health care provider will start you on medicine and may advise you to:  Not have sexual intercourse until your infection has cleared up.  Use a latex condom properly every time you have sexual intercourse.  Limit the number of sexual partners you have. The more partners you have, the greater your risk of contracting trichomoniasis or another STI.  Tell all sexual partners about your infection so they can also be treated and to prevent reinfection.  Talk with your health care provider to discuss your results, treatment options, and if necessary, the need for more tests. Talk with your health care  provider if you have any questions about your results. This information is not intended to replace advice given to you by your health care provider. Make sure you discuss any questions you have with your health care provider. Document Released: 03/26/2004 Document Revised: 09/23/2015 Document Reviewed: 03/05/2013 Elsevier Interactive Patient Education  2017 Elsevier Inc.  

## 2016-08-08 NOTE — Progress Notes (Signed)
Patient presents for TOC. +Trich

## 2016-08-08 NOTE — Progress Notes (Signed)
Subjective:     Patient ID: [name], female   DOB: Oct 22, 1985, 31 y.o.   MRN: [id number] Cc: RLQ apin for 3 days  [id number] LMP mid May, here for [facility] for trichomonas. For a few days she has had RLQ pain, relieved by OTC medication. No discharge or bleeding. Allergies  Allergen Reactions  . Dilaudid [Hydromorphone Hcl] Itching and Other (See Comments)    Reaction:  Hallucinations   Past Medical History:  Diagnosis Date  . Alcohol abuse   . Anemia   . Asthma    rarely uses inhaler  . Bipolar disorder ([place])    HX PPD after 2014 delivery, no meds  . Bronchitis   . 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 Hypertension   . Missed ab    x 2, one requiring D & E  . Pancreatic cyst   . Pancreatitis    diet controlled  . Pelvic inflammatory disease   . Plantar fasciitis   . Polycystic ovary syndrome   . Renal cyst       Review of Systems  Constitutional: Negative.   Gastrointestinal: Negative.   Genitourinary: Positive for pelvic pain. Negative for vaginal discharge.       Objective:   Physical Exam  Constitutional: She appears well-developed. No distress.  Cardiovascular: Normal rate.   Abdominal: Soft. There is no tenderness.  Genitourinary: Vagina normal. No vaginal discharge found.  Genitourinary Comments: No masses or tenderness  Psychiatric: She has a normal mood and affect. Her behavior is normal.  Vitals reviewed.  Probe for vaginitis obtained    Assessment:     Recent treatment for trichomona Suspect Mittelschmerz    Plan:     Review lab result  RTC PRN  Woodroe Mode, MD 08/08/2016

## 2016-08-09 LAB — CERVICOVAGINAL ANCILLARY ONLY
Bacterial vaginitis: NEGATIVE
CANDIDA VAGINITIS: NEGATIVE
CHLAMYDIA, DNA PROBE: NEGATIVE
Neisseria Gonorrhea: NEGATIVE
TRICH (WINDOWPATH): NEGATIVE

## 2016-08-31 ENCOUNTER — Ambulatory Visit: Payer: PRIVATE HEALTH INSURANCE

## 2016-10-27 ENCOUNTER — Ambulatory Visit: Payer: Self-pay | Admitting: Obstetrics

## 2017-03-25 ENCOUNTER — Encounter (HOSPITAL_COMMUNITY): Payer: Self-pay

## 2017-03-25 ENCOUNTER — Inpatient Hospital Stay (HOSPITAL_COMMUNITY)
Admission: AD | Admit: 2017-03-25 | Discharge: 2017-03-25 | Disposition: A | Discharge status: Home / Self Care | Payer: Medicaid Other | Source: Ambulatory Visit | Attending: Obstetrics and Gynecology | Admitting: Obstetrics and Gynecology

## 2017-03-25 ENCOUNTER — Inpatient Hospital Stay (HOSPITAL_COMMUNITY): Payer: Medicaid Other

## 2017-03-25 DIAGNOSIS — Z3490 Encounter for supervision of normal pregnancy, unspecified, unspecified trimester: Secondary | ICD-10-CM

## 2017-03-25 DIAGNOSIS — O209 Hemorrhage in early pregnancy, unspecified: Secondary | ICD-10-CM

## 2017-03-25 DIAGNOSIS — O99331 Smoking (tobacco) complicating pregnancy, first trimester: Secondary | ICD-10-CM | POA: Diagnosis not present

## 2017-03-25 DIAGNOSIS — R1013 Epigastric pain: Secondary | ICD-10-CM | POA: Insufficient documentation

## 2017-03-25 DIAGNOSIS — O26891 Other specified pregnancy related conditions, first trimester: Secondary | ICD-10-CM | POA: Insufficient documentation

## 2017-03-25 DIAGNOSIS — G8929 Other chronic pain: Secondary | ICD-10-CM

## 2017-03-25 DIAGNOSIS — Z3A01 Less than 8 weeks gestation of pregnancy: Secondary | ICD-10-CM | POA: Insufficient documentation

## 2017-03-25 DIAGNOSIS — F17210 Nicotine dependence, cigarettes, uncomplicated: Secondary | ICD-10-CM | POA: Diagnosis not present

## 2017-03-25 LAB — RAPID URINE DRUG SCREEN, HOSP PERFORMED
AMPHETAMINES: NOT DETECTED
Barbiturates: NOT DETECTED
Benzodiazepines: NOT DETECTED
Cocaine: NOT DETECTED
Opiates: NOT DETECTED
Tetrahydrocannabinol: NOT DETECTED

## 2017-03-25 LAB — COMPREHENSIVE METABOLIC PANEL
ALK PHOS: 46 U/L (ref 38–126)
ALT: 12 U/L — AB (ref 14–54)
AST: 16 U/L (ref 15–41)
Albumin: 4 g/dL (ref 3.5–5.0)
Anion gap: 6 (ref 5–15)
BUN: 10 mg/dL (ref 6–20)
CALCIUM: 8.9 mg/dL (ref 8.9–10.3)
CO2: 20 mmol/L — AB (ref 22–32)
CREATININE: 0.59 mg/dL (ref 0.44–1.00)
Chloride: 105 mmol/L (ref 101–111)
GFR calc non Af Amer: >60 mL/min (ref >60)
GLUCOSE: 144 mg/dL — AB (ref 65–99)
Potassium: 4 mmol/L (ref 3.5–5.1)
Sodium: 131 mmol/L — ABNORMAL LOW (ref 135–145)
Total Bilirubin: 0.2 mg/dL — ABNORMAL LOW (ref 0.3–1.2)
Total Protein: 7.4 g/dL (ref 6.5–8.1)

## 2017-03-25 LAB — CBC WITH DIFFERENTIAL/PLATELET
Basophils Absolute: 0 10*3/uL (ref 0.0–0.1)
Basophils Relative: 0 %
EOS ABS: 0 10*3/uL (ref 0.0–0.7)
Eosinophils Relative: 1 %
HCT: 35.4 % — ABNORMAL LOW (ref 36.0–46.0)
HEMOGLOBIN: 12.2 g/dL (ref 12.0–15.0)
LYMPHS ABS: 1.6 10*3/uL (ref 0.7–4.0)
LYMPHS PCT: 28 %
MCH: 23.9 pg — ABNORMAL LOW (ref 26.0–34.0)
MCHC: 34.5 g/dL (ref 30.0–36.0)
MCV: 69.3 fL — ABNORMAL LOW (ref 78.0–100.0)
Monocytes Absolute: 0.3 10*3/uL (ref 0.1–1.0)
Monocytes Relative: 5 %
NEUTROS ABS: 3.8 10*3/uL (ref 1.7–7.7)
NEUTROS PCT: 66 %
Platelets: 400 10*3/uL (ref 150–400)
RBC: 5.11 MIL/uL (ref 3.87–5.11)
RDW: 14.5 % (ref 11.5–15.5)
WBC: 5.7 10*3/uL (ref 4.0–10.5)

## 2017-03-25 LAB — URINALYSIS, ROUTINE W REFLEX MICROSCOPIC
BILIRUBIN URINE: NEGATIVE
Bacteria, UA: NONE SEEN
Glucose, UA: NEGATIVE mg/dL
KETONES UR: 5 mg/dL — AB
LEUKOCYTES UA: NEGATIVE
NITRITE: NEGATIVE
PROTEIN: NEGATIVE mg/dL
Specific Gravity, Urine: 1.026 (ref 1.005–1.030)
pH: 5 (ref 5.0–8.0)

## 2017-03-25 LAB — WET PREP, GENITAL
Clue Cells Wet Prep HPF POC: NONE SEEN
Sperm: NONE SEEN
Trich, Wet Prep: NONE SEEN
Yeast Wet Prep HPF POC: NONE SEEN

## 2017-03-25 LAB — LIPASE, BLOOD: Lipase: 20 U/L (ref 11–51)

## 2017-03-25 LAB — POCT PREGNANCY, URINE: Preg Test, Ur: POSITIVE — AB

## 2017-03-25 LAB — HEMOGLOBIN A1C
Hgb A1c MFr Bld: 5.6 % (ref 4.8–5.6)
Mean Plasma Glucose: 114.02 mg/dL

## 2017-03-25 LAB — AMYLASE: Amylase: 42 U/L (ref 28–100)

## 2017-03-25 MED ORDER — OXYCODONE-ACETAMINOPHEN 5-325 MG PO TABS
1.0000 | ORAL_TABLET | Freq: Once | ORAL | Status: AC
  Administered 2017-03-25: 1 via ORAL
  Filled 2017-03-25: qty 1

## 2017-03-25 NOTE — Discharge Instructions (Signed)
Acute Pain, Adult  Acute pain is a type of pain that may last for just a few days or as long as six months. It is often related to an illness, injury, or medical procedure. Acute pain may be mild, moderate, or severe. It usually goes away once your injury has healed or you are no longer ill.  Pain can make it hard for you to do daily activities. It can cause anxiety and lead to other problems if left untreated. Treatment depends on the cause and severity of your acute pain.  Follow these instructions at home:   Check your pain level as told by your health care provider.   Take over-the-counter and prescription medicines only as told by your health care provider.   If you are taking prescription pain medicine:  ? Ask your health care provider about taking a stool softener or laxative to prevent constipation.  ? Do not stop taking the medicine suddenly. Talk to your health care provider about how and when to discontinue prescription pain medicine.  ? If your pain is severe, do not take more pills than instructed by your health care provider.  ? Do not take other over-the-counter pain medicines in addition to this medicine unless told by your health care provider.  ? Do not drive or operate heavy machinery while taking prescription pain medicine.   Apply ice or heat as told by your health care provider. These may reduce swelling and pain.   Ask your health care provider if other strategies such as distraction, relaxation, or physical therapies can help your pain.   Keep all follow-up visits as told by your health care provider. This is important.  Contact a health care provider if:   You have pain that is not controlled by medicine.   Your pain does not improve or gets worse.   You have side effects from pain medicines, such as vomitingor confusion.  Get help right away if:   You have severe pain.   You have trouble breathing.   You lose consciousness.   You have chest pain or pressure that lasts for more  than a few minutes. Along with the chest pain you may:  ? Have pain or discomfort in one or both arms, your back, neck, jaw, or stomach.  ? Have shortness of breath.  ? Break out in a cold sweat.  ? Feel nauseous.  ? Become light-headed.  These symptoms may represent a serious problem that is an emergency. Do not wait to see if the symptoms will go away. Get medical help right away. Call your local emergency services (911 in the [place] Do not drive yourself to the hospital.  This information is not intended to replace advice given to you by your health care provider. Make sure you discuss any questions you have with your health care provider.  Document Released: 03/08/2015 Document Revised: 07/31/2015 Document Reviewed: 03/08/2015  Elsevier Interactive Patient Education  2018 [employer] Inc[employer]

## 2017-03-25 NOTE — MAU Provider Note (Signed)
History   [id number] early pregnant in with c/o epigastric pain. 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 Denies any lower abd pain.Currently taking metformin, percocet and Creon 12000 uts.  CSN: [id number]  Arrival date & time 03/25/17  4166   None     Chief Complaint  Patient presents with  . Abdominal Pain    HPI  Past Medical History:  Diagnosis Date  . Alcohol abuse   . Anemia   . Asthma    rarely uses inhaler  . Bipolar disorder ([place])    HX PPD after 2014 delivery, no meds  . Bronchitis   . 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 Hypertension   . Missed ab    x 2, one requiring D & E  . Pancreatic cyst   . Pancreatitis    diet controlled  . Pelvic inflammatory disease   . Plantar fasciitis   . Polycystic ovary syndrome   . Renal cyst     Past Surgical History:  Procedure Laterality Date  . CESAREAN SECTION N/A 05/23/2012   Procedure: CESAREAN SECTION;  Surgeon: Lahoma Crocker, MD;  Location: [facility];  Service: Obstetrics;  Laterality: N/A;  primary  . CESAREAN SECTION N/A 04/18/2014   Procedure: REPEAT CESAREAN SECTION;  Surgeon: Shelly Bombard, MD;  Location: [facility];  Service: Obstetrics;  Laterality: N/A;  . DILATION AND CURETTAGE OF UTERUS      for MAB  . EUS N/A 06/11/2015   Procedure: UPPER ENDOSCOPIC ULTRASOUND (EUS) RADIAL;  Surgeon: Milus Banister, MD;  Location: [facility];  Service: Endoscopy;  Laterality: N/A;  . PANCREAS SURGERY  2012   s/p partial pancreatectomy  . SPLENECTOMY, TOTAL  2012  . WISDOM TOOTH EXTRACTION      Family History  Problem Relation Age of Onset  . Diabetes Mother   . Hypertension Mother   . Asthma Father   . Colon cancer Neg Hx     Social History   Tobacco Use  . Smoking status: Current Every Day Smoker     Packs/day: 0.50    Years: 13.00    Pack years: 6.50    Types: Cigarettes  . Smokeless tobacco: Never Used  . Tobacco comment: smokes 4 cigs/day, tobacco info given 04/17/15  Substance Use Topics  . Alcohol use: No    Alcohol/week: 0.0 oz    Comment: social   . Drug use: No    OB History    Gravida Para Term Preterm AB Living   6 2 2   4 2    SAB TAB Ectopic Multiple Live Births   3 1   0 2      Review of Systems  Constitutional: Negative.   HENT: Negative.   Eyes: Negative.   Respiratory: Negative.   Cardiovascular: Negative.   Gastrointestinal: Positive for abdominal pain.  Endocrine: Negative.   Genitourinary: Negative.   Musculoskeletal: Negative.   Skin: Negative.   Allergic/Immunologic: Negative.   Neurological: Negative.   Hematological: Negative.   Psychiatric/Behavioral: Negative.     Allergies  Dilaudid [hydromorphone hcl]  Home Medications    BP 111/72 (BP Location: Right Arm)   Pulse 88   Temp 98.5 F (36.9 C) (Oral)   Resp 16   Ht 5' (1.524 m)   Wt 148  lb (67.1 kg)   LMP 02/05/2017   BMI 28.90 kg/m   Physical Exam  Constitutional: She is oriented to person, place, and time. She appears well-developed and well-nourished.  HENT:  Head: Normocephalic.  Eyes: Pupils are equal, round, and reactive to light.  Neck: Normal range of motion.  Cardiovascular: Normal rate, regular rhythm, normal heart sounds and intact distal pulses.  Pulmonary/Chest: Effort normal and breath sounds normal.  Abdominal: Soft. Bowel sounds are normal.  Musculoskeletal: Normal range of motion.  Neurological: She is alert and oriented to person, place, and time. She has normal reflexes.  Skin: Skin is warm and dry.  Psychiatric: She has a normal mood and affect. Her behavior is normal. Judgment and thought content normal.    MAU Course  Procedures (including critical care time)  Labs Reviewed  URINALYSIS, ROUTINE W REFLEX MICROSCOPIC - Abnormal; Notable for the  following components:      Result Value   APPearance HAZY (*)    Hgb urine dipstick SMALL (*)    Ketones, ur 5 (*)    Squamous Epithelial / LPF 0-5 (*)    All other components within normal limits  POCT PREGNANCY, URINE - Abnormal; Notable for the following components:   Preg Test, Ur POSITIVE (*)    All other components within normal limits  RAPID URINE DRUG SCREEN, HOSP PERFORMED  CBC WITH DIFFERENTIAL/PLATELET  COMPREHENSIVE METABOLIC PANEL  LIPASE, BLOOD  AMYLASE   No results found.   No diagnosis found.    MDM  VSS, Abd soft, non tender, pt has not started [facility] yet will refer to high risk for assessment. Will get labs to assess pancreas and cbc. Labs WNL. U/S shows viable IUP at 6.[redacted] wks gestation. Will d/c home

## 2017-03-25 NOTE — MAU Note (Signed)
Patient had 2 positive pregnancy test, has history of pancreatitis having lower abdominal pain, spotting yesterday none this morning, has history of miscarriage 1 year ago.

## 2017-03-27 LAB — GC/CHLAMYDIA PROBE AMP (~~LOC~~) NOT AT ARMC
Chlamydia: NEGATIVE
NEISSERIA GONORRHEA: NEGATIVE

## 2017-04-04 ENCOUNTER — Other Ambulatory Visit: Payer: Self-pay | Admitting: *Deleted

## 2017-04-04 ENCOUNTER — Ambulatory Visit: Payer: Medicaid Other | Admitting: *Deleted

## 2017-04-04 ENCOUNTER — Encounter: Payer: Medicaid Other | Attending: Obstetrics & Gynecology | Admitting: *Deleted

## 2017-04-04 DIAGNOSIS — Z713 Dietary counseling and surveillance: Secondary | ICD-10-CM | POA: Insufficient documentation

## 2017-04-04 DIAGNOSIS — O24419 Gestational diabetes mellitus in pregnancy, unspecified control: Secondary | ICD-10-CM | POA: Diagnosis present

## 2017-04-04 DIAGNOSIS — O24111 Pre-existing diabetes mellitus, type 2, in pregnancy, first trimester: Secondary | ICD-10-CM

## 2017-04-04 MED ORDER — ACCU-CHEK FASTCLIX LANCETS MISC: 1.0000 | Freq: Four times a day (QID) | 12 refills | Status: DC

## 2017-04-04 MED ORDER — ACCU-CHEK GUIDE W/DEVICE KIT: 1.0000 | PACK | Freq: Once | 0 refills | Status: AC

## 2017-04-04 MED ORDER — GLUCOSE BLOOD VI STRP
1.0000 | ORAL_STRIP | Freq: Four times a day (QID) | 12 refills | Status: DC
Start: 2017-04-04 — End: 2017-04-10

## 2017-04-04 NOTE — Progress Notes (Signed)
  Patient was seen on 04/04/2017 for Type 2 Diabetes with pregnancy self-management. 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 She is typically managed very well with Metformin and has had 2 pregnancies since then. She states she has not ever needed insulin but both babies were delivered early and were over 8 pounds. Her most recent A1c, done on 03/25/2017 was 5.6%! She expressed excellent understanding of her diabetes, has very healthy eating habits and avoids all beverages that contain sugar. She has an Accu Chek Aviva meter which is out of date, so we will call in the Accu Chek Guide meter for her today. The following learning objectives were met by the patient :   States the definition of Type 2 Diabetes with pregnancy    States why dietary management is important in controlling blood glucose  Describes the effects of carbohydrates on blood glucose levels  Demonstrates ability to create a balanced meal plan  Demonstrates carbohydrate counting   States when to check blood glucose levels  Demonstrates proper blood glucose monitoring techniques  States the effect of stress and exercise on blood glucose levels  States the importance of limiting caffeine and abstaining from alcohol and smoking  Plan:  Aim for 3 Carb Choices per meal (45 grams) +/- 1 either way  Aim for 1-2 Carbs per snack Begin reading food labels for Total Carbohydrate of foods Continue with your activity level by walking or other activity daily as tolerated Begin checking BG before breakfast and 2 hours after first bite of breakfast, lunch and dinner as directed by MD  Bring Log Book to every medical appointment   Take medication if directed by MD  Blood glucose monitor Rx called into pharmacy: Accu Check Guide with Fast Clix drums Patient instructed to test pre breakfast and 2 hours each meal as directed by MD Patient  states she did not have to test that often with last 2 pregnancies, I suggested she discuss with MD at her visit next Monday.  Patient instructed to monitor glucose levels: FBS: 60 - 95 mg/dl 2 hour: <120 mg/dl  Patient received the following handouts:  Nutrition Diabetes and Pregnancy  Carbohydrate Counting List  Patient will be seen for follow-up in 1 month.

## 2017-04-10 ENCOUNTER — Encounter: Payer: Self-pay | Admitting: Obstetrics and Gynecology

## 2017-04-10 ENCOUNTER — Ambulatory Visit (INDEPENDENT_AMBULATORY_CARE_PROVIDER_SITE_OTHER): Payer: Medicaid Other | Admitting: Obstetrics and Gynecology

## 2017-04-10 VITALS — BP 105/68 | HR 71 | Wt 149.2 lb

## 2017-04-10 DIAGNOSIS — O09899 Supervision of other high risk pregnancies, unspecified trimester: Secondary | ICD-10-CM | POA: Insufficient documentation

## 2017-04-10 DIAGNOSIS — O09891 Supervision of other high risk pregnancies, first trimester: Secondary | ICD-10-CM

## 2017-04-10 DIAGNOSIS — O24419 Gestational diabetes mellitus in pregnancy, unspecified control: Secondary | ICD-10-CM

## 2017-04-10 DIAGNOSIS — O1415 Severe pre-eclampsia, complicating the puerperium: Secondary | ICD-10-CM

## 2017-04-10 DIAGNOSIS — O10911 Unspecified pre-existing hypertension complicating pregnancy, first trimester: Secondary | ICD-10-CM

## 2017-04-10 DIAGNOSIS — O24911 Unspecified diabetes mellitus in pregnancy, first trimester: Secondary | ICD-10-CM

## 2017-04-10 DIAGNOSIS — Z98891 History of uterine scar from previous surgery: Secondary | ICD-10-CM

## 2017-04-10 DIAGNOSIS — D573 Sickle-cell trait: Secondary | ICD-10-CM

## 2017-04-10 DIAGNOSIS — O10919 Unspecified pre-existing hypertension complicating pregnancy, unspecified trimester: Secondary | ICD-10-CM | POA: Diagnosis not present

## 2017-04-10 LAB — POCT URINALYSIS DIP (DEVICE)
BILIRUBIN URINE: NEGATIVE
Glucose, UA: NEGATIVE mg/dL
Hgb urine dipstick: NEGATIVE
Leukocytes, UA: NEGATIVE
Nitrite: NEGATIVE
Protein, ur: NEGATIVE mg/dL
SPECIFIC GRAVITY, URINE: 1.02 (ref 1.005–1.030)
Urobilinogen, UA: 0.2 mg/dL (ref 0.0–1.0)
pH: 7 (ref 5.0–8.0)

## 2017-04-10 MED ORDER — ACCU-CHEK GUIDE W/DEVICE KIT
1.0000 | PACK | Freq: Once | 0 refills | Status: AC
Start: 2017-04-10 — End: 2017-04-10

## 2017-04-10 MED ORDER — GLUCOSE BLOOD VI STRP: 1.0000 | ORAL_STRIP | Freq: Four times a day (QID) | 12 refills | Status: DC

## 2017-04-10 MED ORDER — ACCU-CHEK FASTCLIX LANCETS MISC: 1.0000 | Freq: Four times a day (QID) | 12 refills | Status: DC

## 2017-04-10 MED ORDER — FOLIC ACID 1 MG PO TABS: 1.0000 mg | ORAL_TABLET | Freq: Every day | ORAL | 2 refills | Status: DC

## 2017-04-10 NOTE — Progress Notes (Signed)
New OB Note  04/10/2017   Clinic: [facility] [place]  Chief Complaint: NOB  Transfer of Care Patient: no  History of Present Illness: Ms. Hooley is a 32 y.o. [id number] @ 9/1 weeks ([place] 9/8, based on Patient's last menstrual period was 02/05/2017.).  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   Any events prior to today's visit: no Her periods were: regular, qmonth She was using no method when she conceived.  She has Negative signs or symptoms of nausea/vomiting of pregnancy. She has Negative signs or symptoms of miscarriage or preterm labor On any medications around the time she conceived/early pregnancy: Creon and metformin  ROS: A 12-point review of systems was performed and negative, except as stated in the above HPI.  OBGYN History: As per HPI. OB History  Gravida Para Term Preterm AB Living  7 2 2   4 2   SAB TAB Ectopic Multiple Live Births  3 1   0 2    # Outcome Date GA Lbr Len/2nd Weight Sex Delivery Anes PTL Lv  7 Current           6 SAB 03/23/16 [redacted]w[redacted]d    SAB     5 Term 04/18/14 [redacted]w[redacted]d  8 lb 10.6 oz (3.93 kg) M CS-Vac Spinal  LIV  4 Term 05/23/12 [redacted]w[redacted]d  9 lb 14.2 oz (4.485 kg) M CS-LTranv Spinal  LIV     Birth Comments: No problems at birth  3 TAB           2 SAB           1 SAB              Any issues with any prior pregnancies: yes (h/o c-section, DM2 poorly controlled) Prior children are healthy, doing well, and without any problems or issues: yes History of pap smears: Yes. Last pap smear 2017 and results were NILM/HPV neg   Past Medical History: Past Medical History:  Diagnosis Date  . Alcohol abuse   . Anemia   . Asthma    rarely uses inhaler  .  Bipolar disorder ([place])    HX PPD after 2014 delivery, no meds  . Bronchitis   . 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 Hypertension   . Missed ab    x 2, one requiring D & E  . Pancreatic cyst   . Pancreatitis    diet controlled  . Pelvic inflammatory disease   . Plantar fasciitis   . Polycystic ovary syndrome   . Renal cyst     Past Surgical History: Past Surgical History:  Procedure Laterality Date  . CESAREAN SECTION N/A 05/23/2012   Procedure: CESAREAN SECTION;  Surgeon: Lahoma Crocker, MD;  Location: [facility];  Service: Obstetrics;  Laterality: N/A;  primary  . CESAREAN SECTION N/A 04/18/2014   Procedure: REPEAT CESAREAN SECTION;  Surgeon: Shelly Bombard, MD;  Location: [facility];  Service: Obstetrics;  Laterality: N/A;  . DILATION AND CURETTAGE OF UTERUS      for MAB  . EUS N/A 06/11/2015   Procedure: UPPER ENDOSCOPIC ULTRASOUND (EUS) RADIAL;  Surgeon: Milus Banister, MD;  Location: [facility];  Service: Endoscopy;  Laterality: N/A;  . PANCREAS SURGERY  2012  s/p partial pancreatectomy  . SPLENECTOMY, TOTAL  2012  . WISDOM TOOTH EXTRACTION      Family History:  Family History  Problem Relation Age of Onset  . Diabetes Mother   . Hypertension Mother   . Asthma Father   . Colon cancer Neg Hx    She denies any female cancers, bleeding or blood clotting disorders.  She denies any history of mental retardation, birth defects or genetic disorders in her or the FOB's history, except he does have [place] trait, too  Social History:  Social History   Socioeconomic History  . Marital status: Single    Spouse name: Not on file  . Number of children: 2  . Years of education: Not on file  . Highest education level: Not on file  Social Needs  . Financial resource strain: Not on file  . Food insecurity - worry: Not on file  . Food insecurity - inability: Not on file  . Transportation needs - medical: Not on file  .  Transportation needs - non-medical: Not on file  Occupational History  . Occupation: Homemaker  Tobacco Use  . Smoking status: Current Every Day Smoker    Packs/day: 0.50    Years: 13.00    Pack years: 6.50    Types: Cigarettes  . Smokeless tobacco: Never Used  . Tobacco comment: smokes 4 cigs/day, tobacco info given 04/17/15  Substance and Sexual Activity  . Alcohol use: No    Alcohol/week: 0.0 oz    Comment: social   . Drug use: No  . Sexual activity: No    Partners: Male    Birth control/protection: None  Other Topics Concern  . Not on file  Social History Narrative  . Not on file    Allergy: Allergies  Allergen Reactions  . Dilaudid [Hydromorphone Hcl] Itching and Other (See Comments)    Reaction:  Hallucinations    Health Maintenance:  Mammogram Up to Date: not applicable  Current Outpatient Medications: PNV, metformin 500/500, creon  Physical Exam:   BP 105/68   Pulse 71   Wt 149 lb 3.2 oz (67.7 kg)   LMP 02/05/2017   BMI 29.14 kg/m  Body mass index is 29.14 kg/m. Contractions: Not present Vag. Bleeding: Scant. Fundal height: not applicable FHTs: 295M  General appearance: Well nourished, well developed female in no acute distress.  Neck:  Supple, normal appearance, and no thyromegaly  Cardiovascular: S1, S2 normal, no murmur, rub or gallop, regular rate and rhythm Respiratory:  Clear to auscultation bilateral. Normal respiratory effort Abdomen: positive bowel sounds and no masses, hernias; diffusely non tender to palpation, non distended Breasts: pt denies any breast s/s Neuro/Psych:  Normal mood and affect.  Skin:  Warm and dry.   Laboratory: none  Imaging:  As per HPI  Assessment: pt stable  Plan: 1. Supervision of other high risk pregnancy, antepartum Routine care. Pt amenable to 1st trimester screen - Protein / Creatinine Ratio, Urine - Comprehensive metabolic panel - Obstetric Panel, Including HIV - Cystic fibrosis gene test -  TSH - [place] MFM Fetal Nuchal Translucency; Future - AMB referral to maternal fetal medicine - AMB MFM GENETICS REFERRAL  2. Diabetes mellitus affecting pregnancy in first trimester Patient sees Dr. Hilarie Fredrickson with [facility] for GI/endo. Patient has been on Creon for years and is still currently still on it. Pt told to stay on it for now and will have her see MFM asap for management recs and re: medication safety. Per quick literature  review, it's category c. 1/19 a1c 5.6. Pt states she doesn't have the right meter that medicaid will pay for so new one and supplies sent in.   Pt told to call Dr. Venita Sheffield office to let them know she's pregnant and for anything new to do. She states she see's him once a year and her PCP manages her primarily. Pt also states it's been about year since her last eye exam and was told to call and make an appointment.   Will need fetal echo at around 22-24wks - Protein / Creatinine Ratio, Urine - Comprehensive metabolic panel - Obstetric Panel, Including HIV - Cystic fibrosis gene test - TSH - AMB referral to maternal fetal medicine  3. Chronic hypertension during pregnancy, antepartum Start baby asa after 12 wks. Baseline pre-x labs today - Protein / Creatinine Ratio, Urine - Comprehensive metabolic panel - Obstetric Panel, Including HIV - Cystic fibrosis gene test - TSH  4. History of postpartum severe pre-eclampsia  5. History of cesarean delivery Recommend repeat. D/w pt re: BTL.   6. Sickle cell trait ([place]) Pt states FOB has [place] trait, too and she is wondering if there is a way to test the fetus. She states that she does know ppl that have disease; I asked her that if the fetus does have disease, if she would consider termination and she states that she would. Will refer to [facility]  Problem list reviewed and updated.  Follow up in 1 weeks.   >50% of 25 min visit spent on counseling and coordination of care.     Durene Romans  MD Attending [facility])

## 2017-04-10 NOTE — Progress Notes (Signed)
FHR obtained w/bedside US Declines Babyscripts app

## 2017-04-11 ENCOUNTER — Encounter: Payer: Self-pay | Admitting: *Deleted

## 2017-04-12 ENCOUNTER — Encounter (HOSPITAL_COMMUNITY): Payer: Medicaid Other

## 2017-04-14 ENCOUNTER — Telehealth: Payer: Self-pay | Admitting: *Deleted

## 2017-04-14 ENCOUNTER — Inpatient Hospital Stay (HOSPITAL_COMMUNITY)
Admission: AD | Admit: 2017-04-14 | Discharge: 2017-04-14 | Disposition: A | Discharge status: Home / Self Care | Payer: Medicaid Other | Source: Ambulatory Visit | Attending: Obstetrics and Gynecology | Admitting: Obstetrics and Gynecology

## 2017-04-14 DIAGNOSIS — R102 Pelvic and perineal pain: Secondary | ICD-10-CM | POA: Insufficient documentation

## 2017-04-14 DIAGNOSIS — O99281 Endocrine, nutritional and metabolic diseases complicating pregnancy, first trimester: Secondary | ICD-10-CM | POA: Insufficient documentation

## 2017-04-14 DIAGNOSIS — O26891 Other specified pregnancy related conditions, first trimester: Secondary | ICD-10-CM | POA: Diagnosis not present

## 2017-04-14 DIAGNOSIS — Z885 Allergy status to narcotic agent status: Secondary | ICD-10-CM | POA: Diagnosis not present

## 2017-04-14 DIAGNOSIS — F17210 Nicotine dependence, cigarettes, uncomplicated: Secondary | ICD-10-CM | POA: Insufficient documentation

## 2017-04-14 DIAGNOSIS — M549 Dorsalgia, unspecified: Secondary | ICD-10-CM | POA: Diagnosis not present

## 2017-04-14 DIAGNOSIS — O209 Hemorrhage in early pregnancy, unspecified: Secondary | ICD-10-CM | POA: Diagnosis present

## 2017-04-14 DIAGNOSIS — O99511 Diseases of the respiratory system complicating pregnancy, first trimester: Secondary | ICD-10-CM | POA: Diagnosis not present

## 2017-04-14 DIAGNOSIS — O24911 Unspecified diabetes mellitus in pregnancy, first trimester: Secondary | ICD-10-CM | POA: Insufficient documentation

## 2017-04-14 DIAGNOSIS — R509 Fever, unspecified: Secondary | ICD-10-CM | POA: Diagnosis present

## 2017-04-14 DIAGNOSIS — Z7984 Long term (current) use of oral hypoglycemic drugs: Secondary | ICD-10-CM | POA: Diagnosis not present

## 2017-04-14 DIAGNOSIS — Z3A09 9 weeks gestation of pregnancy: Secondary | ICD-10-CM | POA: Diagnosis not present

## 2017-04-14 DIAGNOSIS — F319 Bipolar disorder, unspecified: Secondary | ICD-10-CM | POA: Insufficient documentation

## 2017-04-14 DIAGNOSIS — O99341 Other mental disorders complicating pregnancy, first trimester: Secondary | ICD-10-CM | POA: Diagnosis not present

## 2017-04-14 DIAGNOSIS — R109 Unspecified abdominal pain: Secondary | ICD-10-CM | POA: Diagnosis not present

## 2017-04-14 DIAGNOSIS — O09891 Supervision of other high risk pregnancies, first trimester: Secondary | ICD-10-CM | POA: Diagnosis not present

## 2017-04-14 DIAGNOSIS — J45909 Unspecified asthma, uncomplicated: Secondary | ICD-10-CM | POA: Insufficient documentation

## 2017-04-14 DIAGNOSIS — J101 Influenza due to other identified influenza virus with other respiratory manifestations: Secondary | ICD-10-CM | POA: Diagnosis not present

## 2017-04-14 DIAGNOSIS — E282 Polycystic ovarian syndrome: Secondary | ICD-10-CM | POA: Diagnosis not present

## 2017-04-14 DIAGNOSIS — O99331 Smoking (tobacco) complicating pregnancy, first trimester: Secondary | ICD-10-CM | POA: Insufficient documentation

## 2017-04-14 DIAGNOSIS — O161 Unspecified maternal hypertension, first trimester: Secondary | ICD-10-CM | POA: Insufficient documentation

## 2017-04-14 NOTE — Discharge Instructions (Signed)

## 2017-04-14 NOTE — MAU Provider Note (Signed)
History     CSN: [id number]  Arrival date and time: 04/14/17 1450   None     Chief Complaint  Patient presents with  . Vaginal Bleeding  . Fever   [name] is a 32 y.o. [id number] at [redacted]w[redacted]d who presents today with cramping, back pain and spotting since 04/12/17. She also reports that both her children are +Influenza A, and she has had a fever as well.    Vaginal Bleeding  The patient's primary symptoms include pelvic pain and vaginal bleeding. This is a new problem. The current episode started in the past 7 days. The problem occurs constantly. The problem has been unchanged. Pain severity now: 6/10. The problem affects both sides. She is pregnant. Associated symptoms include a fever. Pertinent negatives include no sore throat. The vaginal discharge was bloody. The vaginal bleeding is spotting. She has not been passing clots. She has not been passing tissue. Nothing aggravates the symptoms. She has tried acetaminophen for the symptoms. The treatment provided moderate relief.  Fever   This is a new problem. The current episode started in the past 7 days. Maximum temperature: 102.7. The temperature was taken using an oral thermometer. Associated symptoms include coughing. Pertinent negatives include no sore throat. She has tried acetaminophen for the symptoms. The treatment provided mild relief.     Past Medical History:  Diagnosis Date  . Alcohol abuse   . Anemia   . Asthma    rarely uses inhaler  . Bipolar disorder ([place])    HX PPD after 2014 delivery, no meds  . Bronchitis   . 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 Hypertension   . Missed ab    x 2, one requiring D & E  . Pancreatic cyst   . Pancreatitis    diet controlled  . Pelvic inflammatory disease   . Plantar fasciitis   . Polycystic ovary syndrome   . Renal cyst     Past Surgical History:  Procedure Laterality Date  . CESAREAN SECTION N/A 05/23/2012   Procedure: CESAREAN SECTION;  Surgeon: Lahoma Crocker, MD;  Location: [facility];  Service: Obstetrics;  Laterality: N/A;  primary  . CESAREAN SECTION N/A 04/18/2014   Procedure: REPEAT CESAREAN SECTION;  Surgeon: Shelly Bombard, MD;  Location: [facility];  Service: Obstetrics;  Laterality: N/A;  . DILATION AND CURETTAGE OF UTERUS      for MAB  . EUS N/A 06/11/2015   Procedure: UPPER ENDOSCOPIC ULTRASOUND (EUS) RADIAL;  Surgeon: Milus Banister, MD;  Location: [facility];  Service: Endoscopy;  Laterality: N/A;  . PANCREAS SURGERY  2012   s/p partial pancreatectomy  . SPLENECTOMY, TOTAL  2012  . WISDOM TOOTH EXTRACTION      Family History  Problem Relation Age of Onset  . Diabetes Mother   . Hypertension Mother   . Asthma Father   . Colon cancer Neg Hx     Social History   Tobacco Use  . Smoking status: Current Every Day Smoker    Packs/day: 0.50    Years: 13.00    Pack years: 6.50    Types: Cigarettes  . Smokeless tobacco: Never Used  . Tobacco comment: smokes 4 cigs/day, tobacco info given 04/17/15  Substance Use Topics  . Alcohol use: No    Alcohol/week: 0.0 oz    Comment: social   . Drug use: No    Allergies:  Allergies  Allergen Reactions  . Dilaudid [Hydromorphone Hcl]  Itching and Other (See Comments)    Reaction:  Hallucinations    Medications Prior to Admission  Medication Sig Dispense Refill Last Dose  . ACCU-CHEK FASTCLIX LANCETS MISC 1 each by Percutaneous route 4 (four) times daily. 100 each 12   . acetaminophen (TYLENOL) 500 MG tablet Take 1,000 mg by mouth every 6 (six) hours as needed for mild pain, moderate pain or headache.    Taking  . calcium carbonate (TUMS - DOSED IN MG ELEMENTAL CALCIUM) 500 MG chewable tablet Chew 2 tablets by mouth 2 (two) times daily as needed for indigestion or heartburn.   Taking  . folic acid (FOLVITE) 1 MG tablet Take 1 tablet (1 mg total) by mouth daily. 60 tablet 2   . glucose blood (ACCU-CHEK GUIDE) test strip 1 each by  Other route 4 (four) times daily. Use as instructed 100 each 12   . lipase/protease/amylase (CREON) 12000 units CPEP capsule Take 24,000 Units by mouth 3 (three) times daily before meals.   Taking  . metFORMIN (GLUCOPHAGE) 500 MG tablet Take 500 mg by mouth 2 (two) times daily with a meal.   Taking  . miconazole (MONISTAT 7) 2 % vaginal cream Place 1 Applicatorful vaginally at bedtime.   Not Taking  . oxyCODONE-acetaminophen (PERCOCET/ROXICET) 5-325 MG tablet Take 1-2 tablets by mouth every 6 (six) hours as needed. 30 tablet 0 Taking    Review of Systems  Constitutional: Positive for fever.  HENT: Positive for sinus pain. Negative for sore throat.   Respiratory: Positive for cough.   Genitourinary: Positive for pelvic pain and vaginal bleeding.   Physical Exam   Blood pressure 110/65, pulse 96, temperature 98.1 F (36.7 C), temperature source Oral, resp. rate 16, height 5' (1.524 m), weight 147 lb (66.7 kg), last menstrual period 02/05/2017.  Physical Exam  Nursing note and vitals reviewed. Constitutional: She is oriented to person, place, and time. She appears well-developed and well-nourished. No distress.  HENT:  Head: Normocephalic.  Cardiovascular: Normal rate.  Respiratory: Effort normal.  GI: Soft. There is no tenderness. There is no rebound.  Neurological: She is alert and oriented to person, place, and time.  Skin: Skin is warm and dry.  Psychiatric: She has a normal mood and affect.    Bedside US: +cardiac activity  MAU Course  Procedures  MDM Patient with exposure to influenza, and new onset fever. Discussed with patient the option to start tamiflu. She does not wish to have this medication. Offered rx, but patient states that she will not change her mind, and won't take it.   Safe medications list reviewed Safe tylenol dosing discussed.   Assessment and Plan   1. Influenza A   2. Pelvic pain in pregnancy, antepartum, first trimester   3. [redacted] weeks gestation of  pregnancy    DC home Return precautions discussed  Comfort measures reviewed  1st Trimester precautions  Bleeding precautions RX: none, safe list reviewed and given  Return to MAU as needed FU with OB as planned  [facility] Follow up.   Specialty:  Obstetrics and Gynecology Contact information: [place] 04/14/2017, 3:46 PM

## 2017-04-14 NOTE — MAU Note (Signed)
Pt c/o lower back pain 2 days ago and lower abd painand brown spotting started yesterday. Pt reports fever of 101 and 102.7, cough, sore throat, and nausea Taking tylenol XS as needed. Says her sons both have influenza A.Marland Kitchen

## 2017-04-14 NOTE — Telephone Encounter (Signed)
Pt called and stated that she has the flu, running fever for a few days. She has also developed bleeding and cramping. I spoke to Noni Saupe, NP and she recommended that patient go to MAU for evaluation. Patient is agreeable to this and had no further questions or concerns.

## 2017-04-17 ENCOUNTER — Other Ambulatory Visit (HOSPITAL_COMMUNITY): Payer: Self-pay | Admitting: *Deleted

## 2017-04-17 ENCOUNTER — Ambulatory Visit (INDEPENDENT_AMBULATORY_CARE_PROVIDER_SITE_OTHER): Payer: Medicaid Other | Admitting: Obstetrics and Gynecology

## 2017-04-17 ENCOUNTER — Inpatient Hospital Stay (HOSPITAL_COMMUNITY)
Admission: AD | Admit: 2017-04-17 | Discharge: 2017-04-17 | Disposition: A | Discharge status: Home / Self Care | Payer: Medicaid Other | Source: Ambulatory Visit | Attending: Obstetrics & Gynecology | Admitting: Obstetrics & Gynecology

## 2017-04-17 ENCOUNTER — Encounter (HOSPITAL_COMMUNITY): Payer: Self-pay

## 2017-04-17 ENCOUNTER — Ambulatory Visit (HOSPITAL_COMMUNITY)
Admission: RE | Admit: 2017-04-17 | Discharge: 2017-04-17 | Disposition: A | Discharge status: Home / Self Care | Payer: Medicaid Other | Source: Ambulatory Visit | Attending: Obstetrics and Gynecology | Admitting: Obstetrics and Gynecology

## 2017-04-17 DIAGNOSIS — O24911 Unspecified diabetes mellitus in pregnancy, first trimester: Secondary | ICD-10-CM | POA: Insufficient documentation

## 2017-04-17 DIAGNOSIS — O99331 Smoking (tobacco) complicating pregnancy, first trimester: Secondary | ICD-10-CM | POA: Insufficient documentation

## 2017-04-17 DIAGNOSIS — Z7984 Long term (current) use of oral hypoglycemic drugs: Secondary | ICD-10-CM | POA: Diagnosis not present

## 2017-04-17 DIAGNOSIS — J45909 Unspecified asthma, uncomplicated: Secondary | ICD-10-CM | POA: Diagnosis not present

## 2017-04-17 DIAGNOSIS — O99341 Other mental disorders complicating pregnancy, first trimester: Secondary | ICD-10-CM | POA: Insufficient documentation

## 2017-04-17 DIAGNOSIS — O4691 Antepartum hemorrhage, unspecified, first trimester: Secondary | ICD-10-CM

## 2017-04-17 DIAGNOSIS — Z6790 Unspecified blood type, Rh positive: Secondary | ICD-10-CM

## 2017-04-17 DIAGNOSIS — O99281 Endocrine, nutritional and metabolic diseases complicating pregnancy, first trimester: Secondary | ICD-10-CM | POA: Insufficient documentation

## 2017-04-17 DIAGNOSIS — Z3A10 10 weeks gestation of pregnancy: Secondary | ICD-10-CM | POA: Diagnosis not present

## 2017-04-17 DIAGNOSIS — D573 Sickle-cell trait: Secondary | ICD-10-CM

## 2017-04-17 DIAGNOSIS — O4690 Antepartum hemorrhage, unspecified, unspecified trimester: Secondary | ICD-10-CM

## 2017-04-17 DIAGNOSIS — N841 Polyp of cervix uteri: Secondary | ICD-10-CM

## 2017-04-17 DIAGNOSIS — O24419 Gestational diabetes mellitus in pregnancy, unspecified control: Secondary | ICD-10-CM

## 2017-04-17 DIAGNOSIS — O99511 Diseases of the respiratory system complicating pregnancy, first trimester: Secondary | ICD-10-CM | POA: Diagnosis not present

## 2017-04-17 DIAGNOSIS — F319 Bipolar disorder, unspecified: Secondary | ICD-10-CM | POA: Insufficient documentation

## 2017-04-17 DIAGNOSIS — O209 Hemorrhage in early pregnancy, unspecified: Secondary | ICD-10-CM | POA: Insufficient documentation

## 2017-04-17 DIAGNOSIS — O09899 Supervision of other high risk pregnancies, unspecified trimester: Secondary | ICD-10-CM

## 2017-04-17 DIAGNOSIS — E282 Polycystic ovarian syndrome: Secondary | ICD-10-CM | POA: Insufficient documentation

## 2017-04-17 DIAGNOSIS — K5900 Constipation, unspecified: Secondary | ICD-10-CM

## 2017-04-17 LAB — COMPREHENSIVE METABOLIC PANEL
A/G RATIO: 1.6 (ref 1.2–2.2)
ALK PHOS: 47 IU/L (ref 39–117)
ALT: 6 IU/L (ref 0–32)
AST: 13 IU/L (ref 0–40)
Albumin: 4.3 g/dL (ref 3.5–5.5)
BUN/Creatinine Ratio: 10 (ref 9–23)
BUN: 5 mg/dL — ABNORMAL LOW (ref 6–20)
Bilirubin Total: 0.2 mg/dL (ref 0.0–1.2)
CO2: 18 mmol/L — ABNORMAL LOW (ref 20–29)
Calcium: 9.2 mg/dL (ref 8.7–10.2)
Chloride: 102 mmol/L (ref 96–106)
Creatinine, Ser: 0.49 mg/dL — ABNORMAL LOW (ref 0.57–1.00)
GFR calc Af Amer: 149 mL/min/{1.73_m2} (ref >59)
GFR calc non Af Amer: 129 mL/min/{1.73_m2} (ref >59)
GLOBULIN, TOTAL: 2.7 g/dL (ref 1.5–4.5)
Glucose: 85 mg/dL (ref 65–99)
POTASSIUM: 4.1 mmol/L (ref 3.5–5.2)
SODIUM: 135 mmol/L (ref 134–144)
Total Protein: 7 g/dL (ref 6.0–8.5)

## 2017-04-17 LAB — OBSTETRIC PANEL, INCLUDING HIV
ANTIBODY SCREEN: NEGATIVE
BASOS: 1 %
Basophils Absolute: 0 10*3/uL (ref 0.0–0.2)
EOS (ABSOLUTE): 0 10*3/uL (ref 0.0–0.4)
EOS: 1 %
HEMATOCRIT: 33.5 % — AB (ref 34.0–46.6)
HEMOGLOBIN: 11.2 g/dL (ref 11.1–15.9)
HIV Screen 4th Generation wRfx: NONREACTIVE
Hepatitis B Surface Ag: NEGATIVE
IMMATURE GRANS (ABS): 0 10*3/uL (ref 0.0–0.1)
IMMATURE GRANULOCYTES: 0 %
LYMPHS: 31 %
Lymphocytes Absolute: 1.8 10*3/uL (ref 0.7–3.1)
MCH: 23.8 pg — ABNORMAL LOW (ref 26.6–33.0)
MCHC: 33.4 g/dL (ref 31.5–35.7)
MCV: 71 fL — ABNORMAL LOW (ref 79–97)
MONOCYTES: 9 %
MONOS ABS: 0.5 10*3/uL (ref 0.1–0.9)
NEUTROS PCT: 58 %
Neutrophils Absolute: 3.4 10*3/uL (ref 1.4–7.0)
Platelets: 409 10*3/uL — ABNORMAL HIGH (ref 150–379)
RBC: 4.71 x10E6/uL (ref 3.77–5.28)
RDW: 16.2 % — ABNORMAL HIGH (ref 12.3–15.4)
RPR Ser Ql: NONREACTIVE
RUBELLA: 2.31 {index} (ref >0.99)
Rh Factor: POSITIVE
WBC: 5.7 10*3/uL (ref 3.4–10.8)

## 2017-04-17 LAB — TSH: TSH: 0.961 u[IU]/mL (ref 0.450–4.500)

## 2017-04-17 LAB — PROTEIN / CREATININE RATIO, URINE
CREATININE, UR: 112.2 mg/dL
PROTEIN UR: 13.6 mg/dL
PROTEIN/CREAT RATIO: 121 mg/g{creat} (ref 0–200)

## 2017-04-17 LAB — CYSTIC FIBROSIS GENE TEST

## 2017-04-17 MED ORDER — POLYETHYLENE GLYCOL 3350 17 GM/SCOOP PO POWD: 17.0000 g | Freq: Every day | ORAL | 0 refills | Status: DC

## 2017-04-17 MED ORDER — GLUCOSE BLOOD VI STRP: 1.0000 | ORAL_STRIP | Freq: Four times a day (QID) | 12 refills | Status: DC

## 2017-04-17 NOTE — MAU Provider Note (Signed)
Chief Complaint  Patient presents with  . Vaginal Bleeding   HPI   [name] is a 32 y.o. [id number] female at [redacted]w[redacted]d gestation who presents to MAU tonight with 1 week history of cramping and vaginal bleeding. She states that this pain began 1 week ago and is crampy in nature. She states that nothing makes it better or worse. It is located in RLQ and LLQ. Bleeding as been small in quantity and bright red. Of note, patient has history of spontaneous miscarriage at [redacted] weeks gestation ~1 year ago and states that "it started just like this." She has been evaluated at her clinic this Monday morning and was told everything was fine on [place]. She is here because, given her history, she wants to ensure this is not a threatened abortion.  Denies fevers, chills, palpitations, SOB, NVD, LE edema. Endorses constipation, last BM 1.5 weeks ago.     Past Medical History:  Diagnosis Date  . Alcohol abuse   . Anemia   . Asthma    rarely uses inhaler  . Bipolar disorder ([place])    HX PPD after 2014 delivery, no meds  . Bronchitis   . 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 Hypertension   . Missed ab    x 2, one requiring D & E  . Pancreatic cyst   . Pancreatitis    diet controlled  . Pelvic inflammatory disease   . Plantar fasciitis   . Polycystic ovary syndrome   . Renal cyst     Past Surgical History:  Procedure Laterality Date  . CESAREAN SECTION N/A 05/23/2012   Procedure: CESAREAN SECTION;  Surgeon: Lahoma Crocker, MD;  Location: [facility];  Service: Obstetrics;  Laterality: N/A;  primary  . CESAREAN SECTION N/A 04/18/2014   Procedure: REPEAT CESAREAN SECTION;  Surgeon: Shelly Bombard, MD;  Location: [facility];  Service: Obstetrics;  Laterality: N/A;  . DILATION AND CURETTAGE OF UTERUS      for MAB  . EUS N/A 06/11/2015   Procedure: UPPER ENDOSCOPIC ULTRASOUND (EUS) RADIAL;  Surgeon: Milus Banister, MD;  Location: [facility];  Service:  Endoscopy;  Laterality: N/A;  . PANCREAS SURGERY  2012   s/p partial pancreatectomy  . SPLENECTOMY, TOTAL  2012  . WISDOM TOOTH EXTRACTION      Family History  Problem Relation Age of Onset  . Diabetes Mother   . Hypertension Mother   . Asthma Father   . Colon cancer Neg Hx     Social History   Tobacco Use  . Smoking status: Current Every Day Smoker    Packs/day: 0.50    Years: 13.00    Pack years: 6.50    Types: Cigarettes  . Smokeless tobacco: Never Used  . Tobacco comment: smokes 4 cigs/day, tobacco info given 04/17/15  Substance Use Topics  . Alcohol use: No    Alcohol/week: 0.0 oz    Comment: social   . Drug use: No    Allergies:  Allergies  Allergen Reactions  . Dilaudid [Hydromorphone Hcl] Itching and Other (See Comments)    Reaction:  Hallucinations    Medications Prior to Admission  Medication Sig Dispense Refill Last Dose  . lipase/protease/amylase (CREON) 12000 units CPEP capsule Take 24,000 Units by mouth 3 (three) times daily before meals.   04/17/2017 at Unknown time  . metFORMIN (GLUCOPHAGE) 500 MG tablet Take 500 mg by mouth 2 (two) times daily with a meal.  04/17/2017 at Unknown time  . oxyCODONE-acetaminophen (PERCOCET/ROXICET) 5-325 MG tablet Take 1-2 tablets by mouth every 6 (six) hours as needed. 30 tablet 0 Past Month at Unknown time  . ACCU-CHEK FASTCLIX LANCETS MISC 1 each by Percutaneous route 4 (four) times daily. 100 each 12 Taking  . acetaminophen (TYLENOL) 500 MG tablet Take 1,000 mg by mouth every 6 (six) hours as needed for mild pain, moderate pain or headache.    Taking  . calcium carbonate (TUMS - DOSED IN MG ELEMENTAL CALCIUM) 500 MG chewable tablet Chew 2 tablets by mouth 2 (two) times daily as needed for indigestion or heartburn.   Taking  . folic acid (FOLVITE) 1 MG tablet Take 1 tablet (1 mg total) by mouth daily. 60 tablet 2 Taking  . glucose blood (ACCU-CHEK GUIDE) test strip 1 each by Other route 4 (four) times daily. Use as  instructed 100 each 12   . miconazole (MONISTAT 7) 2 % vaginal cream Place 1 Applicatorful vaginally at bedtime.   Not Taking    ROS Physical Exam Blood pressure 109/67, pulse 80, temperature 98.5 F (36.9 C), resp. rate 18, height 5' (1.524 m), weight 67.1 kg (148 lb), last menstrual period 02/05/2017, SpO2 100 %. Physical Exam General: well appearing, no acute distress Heart: Normal S1/S2, no MRG Abdomen: BS+ all 4 quadrants and epigastrium, mild tenderness to light palpation of lower abdomen (RLQ, suprapubic, LLQ) negative McBurney's point tenderness, negative Rovsing's  Peripheral: distal pulses intact, trace edema of BL LE Genitourinary: labia normal, no lesions or erythema. Closed, multiparous cervix with very slight bleeding. Bleeding worsened when Fox swab used to clear canal. Bimanual exam reveals growth of tissue on the cervix, likely polyp.   MAU Course Procedures  Bedside ultrasound revealed single IUP with good fetal heart tone.  MDM Given scant blood and obvious lesion on closed cervix, likely polyp. No concern for threatened abortion at this time.    Assessment/Plan  Vaginal Bleeding in 1st Trimester -likely structural given exam finding of tissue growth -patient reassured and given education on what to look for in terms of increased bleeding or passage of other content -follow up with OB as scheduled  Abdominal Pain -feel this is likely due to constipation (LBM >1.5w ago) -patient to take Miralax daily to encourage BM -Patient advised to seek medical attention of unable to pass gas, increased pain, pain in chest or back, nausea or vomiting.

## 2017-04-17 NOTE — MAU Note (Signed)
Pt reports vaginal bleeding that started Friday and was seen here, states she was examined and had an bedside u/s. States bleeding lightened up. Started having cramping last night. States she was at the doctor this morning and did a bedside u/s and baby was fine, but has not had an exam. States about 20 minutes ago, it was in her panties. States she has had discharge with the bleeding, thinks she may have an yeast infection. States she has not been tested for yeast or bv. Is concerned because she miscarried 1 year ago.

## 2017-04-17 NOTE — Progress Notes (Signed)
Prenatal Visit Note Date: 04/17/2017 Clinic: [facility]  Subjective:  [name] is a 32 y.o. [id number] at [redacted]w[redacted]d being seen today for ongoing prenatal care.  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  Patient reports had some abdominal pain recently similar to pancreatitis discomfort.  Still has some VB, spotting; needs to use less than a liner when it happens  Contractions: Not present. Vag. Bleeding: Moderate.  Movement: Absent. Denies leaking of fluid.   The following portions of the patient's history were reviewed and updated as appropriate: allergies, current medications, past family history, past medical history, past social history, past surgical history and problem list. Problem list updated.  Objective:   Vitals:   04/17/17 1047  BP: 102/68  Pulse: 88  Weight: 146 lb 12.8 oz (66.6 kg)    Fetal Status: Fetal Heart Rate (bpm): 150s   Movement: Absent     General:  Alert, oriented and cooperative. Patient is in no acute distress.  Skin: Skin is warm and dry. No rash noted.   Cardiovascular: Normal heart rate noted  Respiratory: Normal respiratory effort, no problems with respiration noted  Abdomen: Soft, gravid, appropriate for gestational age. Pain/Pressure: Present     Pelvic:  Cervical exam deferred        Extremities: Normal range of motion.  Edema: None  Mental Status: Normal mood and affect. Normal behavior. Normal judgment and thought content.   Urinalysis:      Assessment and Plan:  Pregnancy: [id number] at [redacted]w[redacted]d  1. Supervision of other high risk pregnancy, antepartum Pt saw GC  earlier today. See below. ER precautions when to come in for VB  2. Diabetes mellitus affecting pregnancy in first trimester Patient sees Dr. Hilarie Fredrickson with [facility] for GI/endo but states they won't take her pregnancy medicaid. Will set her up to see a GI that does. She is wondering about pain medications. She states that she rarely needs to take it (not even weekly). None given today and will continue to follow  3. Chronic hypertension during pregnancy, antepartum Start baby asa after 12 wks.   4. History of postpartum severe pre-eclampsia  5. History of cesarean delivery Recommend repeat. D/w pt re: BTL.   6. Sickle cell trait ([place]) Pt saw [facility] earlier today and states she's for [facility] next week  Preterm labor symptoms and general obstetric precautions including but not limited to vaginal bleeding, contractions, leaking of fluid and fetal movement were reviewed in detail with the patient. Please refer to After Visit Summary for other counseling recommendations.  Return in about 3 weeks (around 05/08/2017) for hrob.   Aletha Halim, MD

## 2017-04-17 NOTE — MAU Note (Signed)
Urine is in the Lab 

## 2017-04-17 NOTE — MAU Provider Note (Signed)
History     CSN: [id number]  Arrival date and time: 04/17/17 2031   First Provider Initiated Contact with Patient 04/17/17 2211      Chief Complaint  Patient presents with  . Vaginal Bleeding   [id number] @10 .1 wks here with cramping and VB. Sx started 1 week ago. No alleviating or exacerbating factors. Pain is bilateral. Bleeding is small, was bright red then became darker. No recent IC. She was seen in the clinic today and had an [place] which was nml but wants to know why she is bleeding. She is very worried because of SAB last year that started with same sx.   OB History    Gravida Para Term Preterm AB Living   7 2 2   4 2    SAB TAB Ectopic Multiple Live Births   3 1   0 2      Past Medical History:  Diagnosis Date  . Alcohol abuse   . Anemia   . Asthma    rarely uses inhaler  . Bipolar disorder ([place])    HX PPD after 2014 delivery, no meds  . Bronchitis   . 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 Hypertension   . Missed ab    x 2, one requiring D & E  . Pancreatic cyst   . Pancreatitis    diet controlled  . Pelvic inflammatory disease   . Plantar fasciitis   . Polycystic ovary syndrome   . Renal cyst     Past Surgical History:  Procedure Laterality Date  . CESAREAN SECTION N/A 05/23/2012   Procedure: CESAREAN SECTION;  Surgeon: Lahoma Crocker, MD;  Location: [facility];  Service: Obstetrics;  Laterality: N/A;  primary  . CESAREAN SECTION N/A 04/18/2014   Procedure: REPEAT CESAREAN SECTION;  Surgeon: Shelly Bombard, MD;  Location: [facility];  Service: Obstetrics;  Laterality: N/A;  . DILATION AND CURETTAGE OF UTERUS      for MAB  . EUS N/A 06/11/2015   Procedure: UPPER ENDOSCOPIC ULTRASOUND (EUS) RADIAL;  Surgeon: Milus Banister, MD;  Location: [facility];  Service: Endoscopy;  Laterality: N/A;  . PANCREAS SURGERY  2012   s/p partial pancreatectomy  . SPLENECTOMY, TOTAL  2012  . WISDOM TOOTH EXTRACTION       Family History  Problem Relation Age of Onset  . Diabetes Mother   . Hypertension Mother   . Asthma Father   . Colon cancer Neg Hx     Social History   Tobacco Use  . Smoking status: Current Every Day Smoker    Packs/day: 0.50    Years: 13.00    Pack years: 6.50    Types: Cigarettes  . Smokeless tobacco: Never Used  . Tobacco comment: smokes 4 cigs/day, tobacco info given 04/17/15  Substance Use Topics  . Alcohol use: No    Alcohol/week: 0.0 oz    Comment: social   . Drug use: No    Allergies:  Allergies  Allergen Reactions  . Dilaudid [Hydromorphone Hcl] Itching and Other (See Comments)    Reaction:  Hallucinations    No medications prior to admission.    Review of Systems  Gastrointestinal: Positive for abdominal pain and constipation.  Genitourinary: Positive for vaginal bleeding.   Physical Exam   Blood pressure 102/66, pulse 84, temperature 98.5 F (36.9 C), resp. rate 19, height 5' (1.524 m), weight 148 lb (67.1 kg), last menstrual period 02/05/2017, SpO2 100 %.  Physical  Exam  Constitutional: She is oriented to person, place, and time. She appears well-developed and well-nourished. No distress.  HENT:  Head: Normocephalic and atraumatic.  Neck: Normal range of motion.  Cardiovascular: Normal rate.  Respiratory: Effort normal. No respiratory distress.  GI: Soft. She exhibits no distension and no mass. There is no tenderness. There is no rebound and no guarding.  Genitourinary:  Genitourinary Comments: External: no lesions or erythema Vagina: rugated, pink, moist, scant drk bloody discharge at os. cervical polyp protruding from os, bled after gentle contact from fox swab Cervix closed/long  Musculoskeletal: Normal range of motion.  Neurological: She is alert and oriented to person, place, and time.  Skin: Skin is warm and dry.  Psychiatric: She has a normal mood and affect.   Limited bedside US: viable, active fetus, +cardiac activity, subj. nml  AFV  No results found for this or any previous visit (from the past 24 hour(s)).  MAU Course  Procedures  MDM Polyp is likely source of bleeding. Discussed she may have further episodes of bleeding. Could consider removal if continues to bleed. Abdominal pain likely constipation. Stable for discharge.  Assessment and Plan   1. [redacted] weeks gestation of pregnancy   2. Cervical polyp   3. Constipation, unspecified constipation type   4. Vaginal bleeding in pregnancy   5. Blood type, Rh positive    Discharge home Follow up in OB office as scheduled Miralax Increase water and dietary fiber SAB precautions  Allergies as of 04/17/2017      Reactions   Dilaudid [hydromorphone Hcl] Itching, Other (See Comments)   Reaction:  Hallucinations      Medication List    STOP taking these medications   miconazole 2 % vaginal cream Commonly known as:  MONISTAT 7   oxyCODONE-acetaminophen 5-325 MG tablet Commonly known as:  PERCOCET/ROXICET     TAKE these medications   ACCU-CHEK FASTCLIX LANCETS Misc 1 each by Percutaneous route 4 (four) times daily.   acetaminophen 500 MG tablet Commonly known as:  TYLENOL Take 1,000 mg by mouth every 6 (six) hours as needed for mild pain, moderate pain or headache.   calcium carbonate 500 MG chewable tablet Commonly known as:  TUMS - dosed in mg elemental calcium Chew 2 tablets by mouth 2 (two) times daily as needed for indigestion or heartburn.   folic acid 1 MG tablet Commonly known as:  FOLVITE Take 1 tablet (1 mg total) by mouth daily.   glucose blood test strip Commonly known as:  ACCU-CHEK GUIDE 1 each by Other route 4 (four) times daily. Use as instructed   lipase/protease/amylase 12000 units Cpep capsule Commonly known as:  CREON Take 24,000 Units by mouth 3 (three) times daily before meals.   metFORMIN 500 MG tablet Commonly known as:  GLUCOPHAGE Take 500 mg by mouth 2 (two) times daily with a meal.   polyethylene glycol powder  powder Commonly known as:  GLYCOLAX/MIRALAX Take 17 g by mouth daily.      Julianne Handler, CNM 04/18/2017, 7:26 AM

## 2017-04-17 NOTE — Progress Notes (Signed)
Patient scanned via bedside ultrasound. Fetus active/moving with FHR 158

## 2017-04-18 NOTE — Progress Notes (Addendum)
Genetic Counseling  High-Risk Gestation Note  Appointment Date:  04/18/2017 Referred By: Jennifer Halim, MD Date of Birth:  [birth year] Partner: [name]   Pregnancy History: [id number] Estimated Date of Delivery: 11/12/17 Estimated Gestational Age: 69w1dAttending: MGriffin Dakin MD  I met with [name] for genetic counseling given that she and the father of the baby both reportedly have sickle cell trait.   In summary:  Discussed family history of sickle cell trait  Patient reported that she and father of the pregnancy have sickle cell trait  Reviewed autosomal recessive inheritance  Patient understands 1 in 4 (25%) risk for sickle cell disease in pregnancy when both parents are carriers  Discussed options of screening / testing  CVS- patient would like to pursue CVS for molecular testing of Sickle cell disease, scheduled 04/28/17  Amnio  Postnatal evaluation-patient is aware the sickle cell is assessed on newborn screening   We began by reviewing the family history in detail. Ms. HHellenbrandreported that she and the father of the pregnancy, Mr. [name] have sickle cell trait. Hemoglobin electrophoresis from 2015 for [name] indicates that she has hemoglobin AS (sickle cell trait). Ms. HWedinreported that Mr. Jennifer Rampwas also evaluated in 2015 by her primary OB, Dr. LLahoma [name] and that the results also indicated that he had sickle cell trait. Ms. HKasprzakreported that their [name] together, [name], has sickle cell trait. We have not been able to obtain medical documentation of Mr. [name] lab results evaluating for sickle cell trait.  Ms. HCrainhas one prior [name] with a different biological father, age [name]. Mr. Jennifer Ramphas a prior [name] age [name] with Wolf-Parkinson White secondary to meningitis after birth. He is currently [name] and is reportedly healthy. [name] with [name]  regarding sickle cell carrier status for her and her partner at that time but was unable to pursue prenatal diagnosis given late gestational age.   We discussed that sickle cell disease affects the shape and function of the red blood cell by producing abnormal hemoglobin. She was counseled that hemoglobin is a protein in the RBCs that carries oxygen to the body's organs. Individuals who have sickle cell disease have changes within the genes that codes for hemoglobin. [name] counseled that sickle cell disease is inherited in an autosomal recessive manner and occurs when both copies of the hemoglobin gene are changed and produce an abnormal hemoglobin S. Typically, one abnormal gene for the production of hemoglobin S is inherited from each parent. A carrier of sickle cell trait has one altered copy of the gene for hemoglobin and one typical working copy, denoted as Hb AS or Hb S trait. Carriers of recessive conditions typically do not have symptoms related to the condition because they still have one functioning copy of the gene, and thus some production of the typical protein coded for by that gene. We discussed that for a carrier couple, each pregnancy has a 1 in 4 (25%) chance to have neither sickle cell trait nor disease, a 1 in 2 (50%) chance for sickle cell trait like the parents, and a 1 in 4 (25%) chance for sickle cell disease.   We discussed that prenatal diagnosis via molecular analysis of HBB gene is available by chorionic villus sampling (CVS) at approximately 10-[redacted] weeks gestation or amniocentesis (after [redacted] weeks gestation). We discussed that there are other HBB variants  that can combine to cause medically significant hemoglobinopathies, thus, confirmatory testing in the parents prior to proceeding with diagnostic testing is recommended to confirm that the correct molecular changes are being testing on the prenatal sample. We discussed the associate risks, benefits, and limitations. We  reviewed the associated 1 in 100 (1%) risk for complications for CVS, including spontaneous pregnancy loss and discussed the risks for maternal cell contamination and confined placental mosaicism. We discussed the associated 1 in [phone] risk for complications with amniocentesis, including spontaneous pregnancy loss. We discussed that detailed ultrasound in pregnancy is available to assess fetal growth and anatomy but does not provide risk assessment for sickle cell disease. We discussed that both maternal and paternal blood samples would be needed to accompany prenatal diagnostic molecular testing for controls. We discussed that we would attempt to obtain medical records of her partner's sickle cell screening and offered repeat hemoglobin electrophoresis. He was not able to be present at today's visit, given that the couple's children were sick. We discussed the risks, limitations, and benefits of each. We discussed the possible results that the tests might provide including: positive, negative, unanticipated, and no result. Finally, they were counseled regarding the cost of each option and potential out of pocket expenses. After careful consideration, [name] elected to pursue CVS, which is scheduled for 04/28/17.   She is aware that the legal limit for termination in pregnancy in [place] is [redacted] weeks gestation.   The family histories were otherwise found to be noncontributory for updates regarding birth defects, intellectual disability, and known genetic conditions. Without further information regarding the provided family history, an accurate genetic risk cannot be calculated. Further genetic counseling is warranted if more information is obtained.  We reviewed available screening and diagnostic options.  Regarding screening tests, we discussed the options of First screen, Quad screen and ultrasound.  She understands that screening tests are used to modify a patient's a priori risk for aneuploidy,  typically based on age.  This estimate provides a pregnancy specific risk assessment. We discussed that given the patient's age alone, the pregnancy is not considered to be at increased risk for fetal aneuploidy. She is currently scheduled for first trimester screening on 05/08/17. However, the patient would like to pursue karyotype analysis on CVS or amniocentesis sample, when obtained, if possible.     [name] denied exposure to environmental toxins or chemical agents. She denied the use of alcohol or street drugs. She reported smoking approximately She denied significant viral illnesses during the course of her pregnancy. 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    I counseled Ms. Juanette Y Castrillo regarding the above risks and available options. Most of the counseling was provided by Carmin Richmond, [employer] genetic counseling student, under my direct supervision. The approximate face-to-face time with the genetic counselor was 45 minutes.  Chipper Oman, MS Certified Genetic Counselor 04/18/2017

## 2017-04-20 DIAGNOSIS — Z3A10 10 weeks gestation of pregnancy: Secondary | ICD-10-CM | POA: Insufficient documentation

## 2017-04-21 ENCOUNTER — Telehealth (HOSPITAL_COMMUNITY): Payer: Self-pay | Admitting: MS"

## 2017-04-21 NOTE — Telephone Encounter (Signed)
Spoke with [name] and her partner [name]. We are unable to locate documentation of [name]'s prior sickle cell screening lab results from the patient's prior OB provider. [name] stated that they likely have documentation of the results at home, but she is unsure if she would be able to locate them. Discussed the option of retesting [name], given that documentation is needed prior to prenatal diagnosis, primarily to ensure that testing is being performed for the correct HBB variants. The couple is amenable to retesting for [name]. He is currently away for work in [place], [place]. He is a Administrator. He stated that he would possibly return to [name] in time this afternoon to come to our office for a lab draw, but his work schedule can be difficult to predict. [name] plans to contact our office either this afternoon, if [name] is back in town, or on Monday, and he will plan to come to our office for hemoglobin electrophoresis evaluation. All questions answered to their satisfaction. I encouraged them to call with additional questions.   [name] 04/21/2017 9:41 AM

## 2017-04-28 ENCOUNTER — Encounter (HOSPITAL_COMMUNITY): Payer: Self-pay

## 2017-04-28 ENCOUNTER — Ambulatory Visit (HOSPITAL_COMMUNITY)
Admission: RE | Admit: 2017-04-28 | Discharge: 2017-04-28 | Disposition: A | Discharge status: Home / Self Care | Payer: Medicaid Other | Source: Ambulatory Visit | Attending: Obstetrics and Gynecology | Admitting: Obstetrics and Gynecology

## 2017-04-28 ENCOUNTER — Other Ambulatory Visit (HOSPITAL_COMMUNITY): Payer: Self-pay | Admitting: Obstetrics and Gynecology

## 2017-04-28 DIAGNOSIS — O99011 Anemia complicating pregnancy, first trimester: Secondary | ICD-10-CM | POA: Insufficient documentation

## 2017-04-28 DIAGNOSIS — O10019 Pre-existing essential hypertension complicating pregnancy, unspecified trimester: Secondary | ICD-10-CM

## 2017-04-28 DIAGNOSIS — O09899 Supervision of other high risk pregnancies, unspecified trimester: Secondary | ICD-10-CM

## 2017-04-28 DIAGNOSIS — D573 Sickle-cell trait: Secondary | ICD-10-CM

## 2017-04-28 DIAGNOSIS — O34219 Maternal care for unspecified type scar from previous cesarean delivery: Secondary | ICD-10-CM

## 2017-04-28 DIAGNOSIS — Z3A11 11 weeks gestation of pregnancy: Secondary | ICD-10-CM

## 2017-05-05 ENCOUNTER — Telehealth (HOSPITAL_COMMUNITY): Payer: Self-pay | Admitting: *Deleted

## 2017-05-08 ENCOUNTER — Ambulatory Visit (HOSPITAL_COMMUNITY): Payer: Medicaid Other

## 2017-05-08 ENCOUNTER — Ambulatory Visit (HOSPITAL_COMMUNITY)
Admission: RE | Admit: 2017-05-08 | Discharge: 2017-05-08 | Disposition: A | Discharge status: Home / Self Care | Payer: Medicaid Other | Source: Ambulatory Visit | Attending: Obstetrics and Gynecology | Admitting: Obstetrics and Gynecology

## 2017-05-08 ENCOUNTER — Encounter: Payer: Self-pay | Admitting: Obstetrics and Gynecology

## 2017-05-08 ENCOUNTER — Encounter (HOSPITAL_COMMUNITY): Payer: Self-pay

## 2017-05-08 ENCOUNTER — Ambulatory Visit (INDEPENDENT_AMBULATORY_CARE_PROVIDER_SITE_OTHER): Payer: Medicaid Other | Admitting: Obstetrics and Gynecology

## 2017-05-08 VITALS — BP 122/70 | HR 103 | Wt 146.4 lb

## 2017-05-08 DIAGNOSIS — O1415 Severe pre-eclampsia, complicating the puerperium: Secondary | ICD-10-CM

## 2017-05-08 DIAGNOSIS — K861 Other chronic pancreatitis: Secondary | ICD-10-CM

## 2017-05-08 DIAGNOSIS — O24911 Unspecified diabetes mellitus in pregnancy, first trimester: Secondary | ICD-10-CM

## 2017-05-08 DIAGNOSIS — Z98891 History of uterine scar from previous surgery: Secondary | ICD-10-CM

## 2017-05-08 DIAGNOSIS — O09899 Supervision of other high risk pregnancies, unspecified trimester: Secondary | ICD-10-CM

## 2017-05-08 DIAGNOSIS — D219 Benign neoplasm of connective and other soft tissue, unspecified: Secondary | ICD-10-CM | POA: Insufficient documentation

## 2017-05-08 DIAGNOSIS — D573 Sickle-cell trait: Secondary | ICD-10-CM

## 2017-05-08 DIAGNOSIS — O10919 Unspecified pre-existing hypertension complicating pregnancy, unspecified trimester: Secondary | ICD-10-CM

## 2017-05-08 MED ORDER — ASPIRIN EC 81 MG PO TBEC: 81.0000 mg | DELAYED_RELEASE_TABLET | Freq: Every day | ORAL | 2 refills | Status: DC

## 2017-05-08 MED ORDER — GLUCOSE BLOOD VI STRP: ORAL_STRIP | 12 refills | Status: DC

## 2017-05-08 MED ORDER — GLYBURIDE 2.5 MG PO TABS: 1.2500 mg | ORAL_TABLET | Freq: Two times a day (BID) | ORAL | 3 refills | Status: DC

## 2017-05-08 NOTE — Progress Notes (Signed)
PRENATAL VISIT NOTE  Subjective:  [name] is a 32 y.o. [id number] at [redacted]w[redacted]d being seen today for ongoing prenatal care.  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  Patient reports pelvic pain and pressure.  Contractions: Not present. Vag. Bleeding: None.  Movement: Absent. Denies leaking of fluid. She is feeling a lot of pressure and pain that she thinks is due to fibroid. She is having more pain that in prior pregnancies.  T2DM, metformin 500 mg BID, states BG overall are higher than it has been in past FG: 101-110 PP: 120-135   The following portions of the patient's history were reviewed and updated as appropriate: allergies, current medications, past family history, past medical history, past social history, past surgical history and problem list. Problem list updated.  Objective:   Vitals:   05/08/17 1322  BP: 122/70  Pulse: (!) 103  Weight: 146 lb 6.4 oz (66.4 kg)    Fetal Status: Fetal Heart Rate (bpm): 147   Movement: Absent     General:  Alert, oriented and cooperative. Patient is in no acute distress.  Skin: Skin is warm and dry. No rash noted.   Cardiovascular: Normal heart rate noted  Respiratory: Normal respiratory effort, no problems with respiration noted  Abdomen: Soft, gravid, appropriate for gestational age.  Pain/Pressure: Present     Pelvic: Cervical exam deferred        Extremities: Normal range of motion.  Edema: None  Mental Status:  Normal mood and affect. Normal behavior. Normal judgment and thought content.   Assessment and Plan:    Pregnancy: [id number] at [redacted]w[redacted]d  1. Chronic hypertension during pregnancy, antepartum No meds Cont baby ASA  2. History of postpartum severe pre-eclampsia Cont baby ASA  3. Diabetes mellitus affecting pregnancy in first trimester Blood sugars elevated on metformin, reviewed recommendation to start insulin, patient resistant, would prefer to start glyburide as she has been on this in past pregnancies with good results. Reviewed that insulin is better medicine for tight glycemic control and may recommend switch to insulin at some point in future, she is agreeable, states she has been not doing well with diet and will improve it but strongly wants to start glyburide first Will start with glyburide 1.25 mg BID  4. Hemoglobin S trait ([facility]) S/p CVS on 04/28/17 FOB with trait Results pending  5. History of cesarean delivery  6. Supervision of other high risk pregnancy, antepartum  7. Fibroid Monitor growth Q trimester  8. Chronic pancreatitis, unspecified pancreatitis type ([place]) Not taking creon per PCP Has been taking hydrocodone for pain prn Reviewed that there is no data regarding Creon and that is likely why PCP recommended she stop, reviewed that as it is digestive enzymes and there is no absorption, should be considered safe in pregnancy. She is resistant to restarting it because she is worried about potential risk to fetus   Preterm labor symptoms and general obstetric precautions including but not limited to vaginal bleeding, contractions, leaking of fluid and fetal movement were reviewed in detail with the patient. Please refer to After Visit Summary for other counseling recommendations.  Return in about 2 weeks (around 05/22/2017) for OB  visit (MD).   Sloan Leiter, MD

## 2017-05-09 ENCOUNTER — Telehealth (HOSPITAL_COMMUNITY): Payer: Self-pay | Admitting: MS"

## 2017-05-09 NOTE — Telephone Encounter (Signed)
Called Ms. Clydell Y Julius regarding molecular Hb S testing performed on CVS. Patient identified by name and DOB. Discussed that molecular results identified one copy of Hb S, which is consistent with sickle cell trait. Maternal cell contamination studies were negative. Karyotype analysis pending. Will contact patient once karyotype results available.   [name] 05/09/2017 5:22 PM

## 2017-05-10 ENCOUNTER — Other Ambulatory Visit: Payer: Self-pay

## 2017-05-11 ENCOUNTER — Telehealth (HOSPITAL_COMMUNITY): Payer: Self-pay | Admitting: MS"

## 2017-05-11 ENCOUNTER — Other Ambulatory Visit: Payer: Self-pay

## 2017-05-11 NOTE — Telephone Encounter (Signed)
Called [name] to discuss the karyotype results from her CVS. Patient identified by name and DOB.  We reviewed that these are within normal limits, 46,XX.  Fetal sex was disclosed per patient's request, and we discussed that chromosome analysis is consistent with female sex chromosomes. All questions were answered to her satisfaction, she was encouraged to call with additional questions or concerns.  Chipper Oman MS Insurance risk surveyor

## 2017-05-20 ENCOUNTER — Encounter (HOSPITAL_COMMUNITY): Payer: Self-pay | Admitting: *Deleted

## 2017-05-20 ENCOUNTER — Inpatient Hospital Stay (HOSPITAL_COMMUNITY)
Admission: AD | Admit: 2017-05-20 | Discharge: 2017-05-20 | Disposition: A | Discharge status: Home / Self Care | Payer: Medicaid Other | Source: Ambulatory Visit | Attending: Obstetrics & Gynecology | Admitting: Obstetrics & Gynecology

## 2017-05-20 DIAGNOSIS — O09892 Supervision of other high risk pregnancies, second trimester: Secondary | ICD-10-CM | POA: Diagnosis not present

## 2017-05-20 DIAGNOSIS — O99342 Other mental disorders complicating pregnancy, second trimester: Secondary | ICD-10-CM | POA: Insufficient documentation

## 2017-05-20 DIAGNOSIS — Z7982 Long term (current) use of aspirin: Secondary | ICD-10-CM | POA: Insufficient documentation

## 2017-05-20 DIAGNOSIS — O208 Other hemorrhage in early pregnancy: Secondary | ICD-10-CM | POA: Insufficient documentation

## 2017-05-20 DIAGNOSIS — Z7984 Long term (current) use of oral hypoglycemic drugs: Secondary | ICD-10-CM | POA: Diagnosis not present

## 2017-05-20 DIAGNOSIS — N841 Polyp of cervix uteri: Secondary | ICD-10-CM | POA: Diagnosis not present

## 2017-05-20 DIAGNOSIS — D219 Benign neoplasm of connective and other soft tissue, unspecified: Secondary | ICD-10-CM

## 2017-05-20 DIAGNOSIS — O99332 Smoking (tobacco) complicating pregnancy, second trimester: Secondary | ICD-10-CM | POA: Insufficient documentation

## 2017-05-20 DIAGNOSIS — Z3A14 14 weeks gestation of pregnancy: Secondary | ICD-10-CM | POA: Diagnosis not present

## 2017-05-20 DIAGNOSIS — O209 Hemorrhage in early pregnancy, unspecified: Secondary | ICD-10-CM

## 2017-05-20 DIAGNOSIS — K861 Other chronic pancreatitis: Secondary | ICD-10-CM

## 2017-05-20 DIAGNOSIS — F17210 Nicotine dependence, cigarettes, uncomplicated: Secondary | ICD-10-CM | POA: Insufficient documentation

## 2017-05-20 DIAGNOSIS — O09899 Supervision of other high risk pregnancies, unspecified trimester: Secondary | ICD-10-CM

## 2017-05-20 DIAGNOSIS — O3442 Maternal care for other abnormalities of cervix, second trimester: Secondary | ICD-10-CM | POA: Diagnosis not present

## 2017-05-20 DIAGNOSIS — J45909 Unspecified asthma, uncomplicated: Secondary | ICD-10-CM | POA: Diagnosis not present

## 2017-05-20 DIAGNOSIS — O99512 Diseases of the respiratory system complicating pregnancy, second trimester: Secondary | ICD-10-CM | POA: Insufficient documentation

## 2017-05-20 DIAGNOSIS — O99282 Endocrine, nutritional and metabolic diseases complicating pregnancy, second trimester: Secondary | ICD-10-CM | POA: Diagnosis not present

## 2017-05-20 DIAGNOSIS — O24912 Unspecified diabetes mellitus in pregnancy, second trimester: Secondary | ICD-10-CM | POA: Diagnosis not present

## 2017-05-20 DIAGNOSIS — O162 Unspecified maternal hypertension, second trimester: Secondary | ICD-10-CM | POA: Insufficient documentation

## 2017-05-20 DIAGNOSIS — E282 Polycystic ovarian syndrome: Secondary | ICD-10-CM | POA: Diagnosis not present

## 2017-05-20 DIAGNOSIS — O10919 Unspecified pre-existing hypertension complicating pregnancy, unspecified trimester: Secondary | ICD-10-CM

## 2017-05-20 LAB — URINALYSIS, ROUTINE W REFLEX MICROSCOPIC
Bilirubin Urine: NEGATIVE
Glucose, UA: NEGATIVE mg/dL
Hgb urine dipstick: NEGATIVE
KETONES UR: NEGATIVE mg/dL
LEUKOCYTES UA: NEGATIVE
NITRITE: NEGATIVE
PH: 5 (ref 5.0–8.0)
Protein, ur: NEGATIVE mg/dL
Specific Gravity, Urine: 1.02 (ref 1.005–1.030)

## 2017-05-20 LAB — WET PREP, GENITAL
CLUE CELLS WET PREP: NONE SEEN
Sperm: NONE SEEN
Trich, Wet Prep: NONE SEEN
YEAST WET PREP: NONE SEEN

## 2017-05-20 LAB — GLUCOSE, CAPILLARY: GLUCOSE-CAPILLARY: 95 mg/dL (ref 65–99)

## 2017-05-20 NOTE — Discharge Instructions (Signed)
Vaginal Bleeding During Pregnancy, Second Trimester A small amount of bleeding (spotting) from the vagina is common in pregnancy. Sometimes the bleeding is normal and is not a problem, and sometimes it is a sign of something serious. Be sure to tell your doctor about any bleeding from your vagina right away. Follow these instructions at home:  Watch your condition for any changes.  Follow your doctor's instructions about how active you can be.  If you are on bed rest: ? You may need to stay in bed and only get up to use the bathroom. ? You may be allowed to do some activities. ? If you need help, make plans for someone to help you.  Write down: ? The number of pads you use each day. ? How often you change pads. ? How soaked (saturated) your pads are.  Do not use tampons.  Do not douche.  Do not have sex or orgasms until your doctor says it is okay.  If you pass any tissue from your vagina, save the tissue so you can show it to your doctor.  Only take medicines as told by your doctor.  Do not take aspirin because it can make you bleed.  Do not exercise, lift heavy weights, or do any activities that take a lot of energy and effort unless your doctor says it is okay.  Keep all follow-up visits as told by your doctor. Contact a doctor if:  You bleed from your vagina.  You have cramps.  You have labor pains.  You have a fever that does not go away after you take medicine. Get help right away if:  You have very bad cramps in your back or belly (abdomen).  You have contractions.  You have chills.  You pass large clots or tissue from your vagina.  You bleed more.  You feel light-headed or weak.  You pass out (faint).  You are leaking fluid or have a gush of fluid from your vagina. This information is not intended to replace advice given to you by your health care provider. Make sure you discuss any questions you have with your health care provider. Document  Released: 07/08/2013 Document Revised: 07/30/2015 Document Reviewed: 10/29/2012 Elsevier Interactive Patient Education  2018 Elsevier Inc.  

## 2017-05-20 NOTE — MAU Note (Signed)
Had an episode of spotting tonight that was dark blood on tissue. Hx pancreatitis. Part of pancreas removed and splenectomy. Has cervical polyp but no intercourse in 2wks. No pain. Diabetic on Glyburide but states sugars have been swinging today with highs and lows. No further spotting since earlier tonight but concerned

## 2017-05-20 NOTE — MAU Provider Note (Addendum)
History   [name] is a 32 year old [id number] at 14+6 who is here to be evaluated for an episode of vaginal bleeding earlier this evening. She has no cramping. She has a history of a polyp on her cervix. States no intercourse for about 2 weeks. No other complaints. History of partial spleenectomy and pancreatectomy and diabetes on glyburide. States blood sugars have been variable today. No Brooke Pace   CSN: [id number]  Arrival date and time: 05/20/17 2028   None     Chief Complaint  Patient presents with  . Vaginal Bleeding   HPI    Past Medical History:  Diagnosis Date  . Alcohol abuse   . Anemia   . Asthma    rarely uses inhaler  . Bipolar disorder ([place])    HX PPD after 2014 delivery, no meds  . Bronchitis   . Diabetes mellitus without complication ([place])    partial pancreatectomy 2012-glyburide  . Hypertension   . Missed ab    x 2, one requiring D & E  . Pancreatic cyst   . Pancreatitis    diet controlled  . Pelvic inflammatory disease   . Plantar fasciitis   . Polycystic ovary syndrome   . Renal cyst     Past Surgical History:  Procedure Laterality Date  . CESAREAN SECTION N/A 05/23/2012   Procedure: CESAREAN SECTION;  Surgeon: Lahoma Crocker, MD;  Location: [facility];  Service: Obstetrics;  Laterality: N/A;  primary  . CESAREAN SECTION N/A 04/18/2014   Procedure: REPEAT CESAREAN SECTION;  Surgeon: Shelly Bombard, MD;  Location: [facility];  Service: Obstetrics;  Laterality: N/A;  . DILATION AND CURETTAGE OF UTERUS      for MAB  . EUS N/A 06/11/2015   Procedure: UPPER ENDOSCOPIC ULTRASOUND (EUS) RADIAL;  Surgeon: Milus Banister, MD;  Location: [facility];  Service: Endoscopy;  Laterality: N/A;  . PANCREAS SURGERY  2012   s/p partial pancreatectomy  . SPLENECTOMY, TOTAL  2012  . WISDOM TOOTH EXTRACTION      Family History  Problem Relation Age of Onset  . Diabetes Mother   . Hypertension Mother   . Asthma Father   . Colon cancer Neg Hx     Social History    Tobacco Use  . Smoking status: Current Every Day Smoker    Packs/day: 0.50    Years: 13.00    Pack years: 6.50    Types: Cigarettes  . Smokeless tobacco: Never Used  . Tobacco comment: smokes 4 cigs/day, tobacco info given 04/17/15  Substance Use Topics  . Alcohol use: No    Alcohol/week: 0.0 oz    Comment: social   . Drug use: No    Allergies:  Allergies  Allergen Reactions  . Dilaudid [Hydromorphone Hcl] Itching and Other (See Comments)    Reaction:  Hallucinations    Medications Prior to Admission  Medication Sig Dispense Refill Last Dose  . ACCU-CHEK FASTCLIX LANCETS MISC 1 each by Percutaneous route 4 (four) times daily. 100 each 12 Taking  . acetaminophen (TYLENOL) 500 MG tablet Take 1,000 mg by mouth every 6 (six) hours as needed for mild pain, moderate pain or headache.    Taking  . aspirin EC 81 MG tablet Take 1 tablet (81 mg total) by mouth daily. Take after 12 weeks for prevention of preeclampsia later in pregnancy 300 tablet 2   . calcium carbonate (TUMS - DOSED IN MG ELEMENTAL CALCIUM) 500 MG chewable tablet Chew 2 tablets by mouth  2 (two) times daily as needed for indigestion or heartburn.   Not Taking  . folic acid (FOLVITE) 1 MG tablet Take 1 tablet (1 mg total) by mouth daily. 60 tablet 2 Taking  . glucose blood (ACCU-CHEK AVIVA) test strip Use as instructed 100 each 12   . glucose blood (ACCU-CHEK GUIDE) test strip 1 each by Other route 4 (four) times daily. Use as instructed 100 each 12 Taking  . glyBURIDE (DIABETA) 2.5 MG tablet Take 0.5 tablets (1.25 mg total) by mouth 2 (two) times daily with a meal. 60 tablet 3   . lipase/protease/amylase (CREON) 12000 units CPEP capsule Take 24,000 Units by mouth 3 (three) times daily before meals.   Not Taking  . metFORMIN (GLUCOPHAGE) 500 MG tablet Take 500 mg by mouth 2 (two) times daily with a meal.   Taking  . polyethylene glycol powder (GLYCOLAX/MIRALAX) powder Take 17 g by mouth daily. (Patient not taking: Reported  on 05/08/2017) 255 g 0 Not Taking    Review of Systems  Constitutional: Negative.   HENT: Negative.   Eyes: Negative.   Respiratory: Negative.   Cardiovascular: Negative.   Gastrointestinal: Negative.   Endocrine: Negative.   Genitourinary: Positive for vaginal bleeding.  Musculoskeletal: Negative.   Skin: Negative.   Allergic/Immunologic: Negative.   Neurological: Negative.   Hematological: Negative.   Psychiatric/Behavioral: Negative.    Physical Exam   Blood pressure 111/66, pulse 77, resp. rate 18, weight 65.8 kg (145 lb), last menstrual period 02/05/2017.  Physical Exam  Constitutional: She is oriented to person, place, and time. She appears well-developed and well-nourished.  HENT:  Head: Normocephalic.  Eyes: Pupils are equal, round, and reactive to light.  Neck: Normal range of motion.  Cardiovascular: Normal rate and regular rhythm.  Respiratory: Effort normal.  GI: Soft.  Genitourinary:  Genitourinary Comments: Vaginal bleeding, scant amount, red  Musculoskeletal: Normal range of motion.  Neurological: She is alert and oriented to person, place, and time. She has normal reflexes.  Skin: Skin is warm and dry.  Psychiatric: She has a normal mood and affect.    MAU Course  Procedures  MDM Wet prep & GC/chlamydia obtained. Spec exam. UA obtained.   Assessment and Plan  Spec exam shows a visually closed cervix with a scant amount of white discharge and a cyst with a scant amount of bleeding. Wet prep and UA negative. FHT 142. Pt reassured. FSBG 95. Discharge home.  [place] Bing Pruitt 05/20/2017, 9:10 PM  I assessed this pt and agree with the above assessment

## 2017-05-20 NOTE — MAU Note (Addendum)
PT  SAYS  SHE HAD  VAG BLEEDING  - STARTED AT 7 PM-     AND   BLOOD SUGAR -   FASTING WAS 144.    AFTER BKFT- 98.   -  ON GLYBURIDE. [facility]- WITH  CLINIC   AND  [place]

## 2017-05-22 LAB — GC/CHLAMYDIA PROBE AMP (~~LOC~~) NOT AT ARMC
Chlamydia: NEGATIVE
Neisseria Gonorrhea: NEGATIVE

## 2017-05-24 ENCOUNTER — Ambulatory Visit (INDEPENDENT_AMBULATORY_CARE_PROVIDER_SITE_OTHER): Payer: Medicaid Other | Admitting: Obstetrics and Gynecology

## 2017-05-24 ENCOUNTER — Encounter: Payer: Self-pay | Admitting: Obstetrics and Gynecology

## 2017-05-24 VITALS — BP 97/67 | HR 82 | Wt 147.3 lb

## 2017-05-24 DIAGNOSIS — O09899 Supervision of other high risk pregnancies, unspecified trimester: Secondary | ICD-10-CM

## 2017-05-24 DIAGNOSIS — Z98891 History of uterine scar from previous surgery: Secondary | ICD-10-CM

## 2017-05-24 DIAGNOSIS — O24912 Unspecified diabetes mellitus in pregnancy, second trimester: Secondary | ICD-10-CM

## 2017-05-24 DIAGNOSIS — O10912 Unspecified pre-existing hypertension complicating pregnancy, second trimester: Secondary | ICD-10-CM

## 2017-05-24 DIAGNOSIS — O10919 Unspecified pre-existing hypertension complicating pregnancy, unspecified trimester: Secondary | ICD-10-CM

## 2017-05-24 DIAGNOSIS — D573 Sickle-cell trait: Secondary | ICD-10-CM

## 2017-05-24 DIAGNOSIS — O1415 Severe pre-eclampsia, complicating the puerperium: Secondary | ICD-10-CM

## 2017-05-24 DIAGNOSIS — K861 Other chronic pancreatitis: Secondary | ICD-10-CM

## 2017-05-24 LAB — POCT URINALYSIS DIP (DEVICE)
BILIRUBIN URINE: NEGATIVE
GLUCOSE, UA: 100 mg/dL — AB
HGB URINE DIPSTICK: NEGATIVE
Ketones, ur: 40 mg/dL — AB
LEUKOCYTES UA: NEGATIVE
NITRITE: NEGATIVE
Protein, ur: NEGATIVE mg/dL
Specific Gravity, Urine: >=1.03 (ref 1.005–1.030)
Urobilinogen, UA: 0.2 mg/dL (ref 0.0–1.0)
pH: 6 (ref 5.0–8.0)

## 2017-05-24 NOTE — Progress Notes (Signed)
   PRENATAL VISIT NOTE  Subjective:  [name] is a 32 y.o. L2G4010 at [redacted]w[redacted]d being seen today for ongoing prenatal care.  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  Patient reports no complaints.  Contractions: Not present. Vag. Bleeding: None.  Movement: Present. Denies leaking of fluid.   T2DM on glyburide 1.25 mg BID, did not bring log today, reports her sugars have been going low and her son had to call 911 as he could not wake her and her BG was 33, has had several dizzy episodes where she felt sugar was too low and then had juice/snacks and post prandials were too hgiht FG: 96-112 PP: reports post meals are 110s if she is not having lows and having to compensate with juice/sugar  The following portions of the patient's history were reviewed and updated as appropriate: allergies, current medications, past family history, past medical history, past social history, past surgical history and problem list. Problem list updated.  Objective:   Vitals:   05/24/17 0930  BP: 97/67  Pulse: 82  Weight: 147 lb 4.8 oz (66.8 kg)    Fetal Status: Fetal Heart Rate (bpm): 136   Movement: Present     General:  Alert, oriented and cooperative. Patient is in no acute distress.  Skin: Skin is warm and dry. No rash noted.   Cardiovascular: Normal heart rate noted  Respiratory: Normal respiratory effort, no problems with respiration noted  Abdomen: Soft, gravid, appropriate for gestational age.  Pain/Pressure: Present     Pelvic: Cervical exam deferred        Extremities: Normal  range of motion.  Edema: None  Mental Status:  Normal mood and affect. Normal behavior. Normal judgment and thought content.   Assessment and Plan:  Pregnancy: [id number] at [redacted]w[redacted]d  1. Chronic hypertension during pregnancy, antepartum Stable BP, no meds Cont baby ASA  2. History of postpartum severe pre-eclampsia  3. Diabetes mellitus affecting pregnancy in second trimester On glyburide 1.25 mg BID (declined insulin) Has had a couple of hypoglycemic episodes but is doing better with sugars under control when she eats more carbs, she would like to continue glyburide for another couple of weeks rather than switch to insulin now, will place referral to nutrition/diabetes services for consult  4. 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 Supervision of other high risk pregnancy, antepartum Anatomy scheduled today  6. History of cesarean delivery x2  7. Chronic pancreatitis, unspecified pancreatitis type ([place]) Taking pain meds prn Not taking creon   Preterm labor symptoms and general obstetric precautions including but not limited to vaginal bleeding, contractions, leaking of fluid and fetal movement were reviewed in detail with the patient. Please refer to After Visit Summary for other counseling recommendations.  Return in about 3 weeks (around 06/14/2017) for OB visit (MD).   Sloan Leiter, MD

## 2017-06-13 ENCOUNTER — Other Ambulatory Visit: Payer: Medicaid Other

## 2017-06-14 ENCOUNTER — Encounter (HOSPITAL_COMMUNITY): Payer: Self-pay

## 2017-06-14 ENCOUNTER — Other Ambulatory Visit (HOSPITAL_COMMUNITY): Payer: Self-pay | Admitting: *Deleted

## 2017-06-14 ENCOUNTER — Ambulatory Visit (HOSPITAL_COMMUNITY)
Admission: RE | Admit: 2017-06-14 | Discharge: 2017-06-14 | Disposition: A | Discharge status: Home / Self Care | Payer: Medicaid Other | Source: Ambulatory Visit | Attending: Obstetrics and Gynecology | Admitting: Obstetrics and Gynecology

## 2017-06-14 ENCOUNTER — Other Ambulatory Visit: Payer: Self-pay | Admitting: Obstetrics and Gynecology

## 2017-06-14 DIAGNOSIS — O09299 Supervision of pregnancy with other poor reproductive or obstetric history, unspecified trimester: Secondary | ICD-10-CM

## 2017-06-14 DIAGNOSIS — O10919 Unspecified pre-existing hypertension complicating pregnancy, unspecified trimester: Secondary | ICD-10-CM

## 2017-06-14 DIAGNOSIS — Z3A18 18 weeks gestation of pregnancy: Secondary | ICD-10-CM | POA: Diagnosis not present

## 2017-06-14 DIAGNOSIS — D573 Sickle-cell trait: Secondary | ICD-10-CM

## 2017-06-14 DIAGNOSIS — Z862 Personal history of diseases of the blood and blood-forming organs and certain disorders involving the immune mechanism: Secondary | ICD-10-CM | POA: Insufficient documentation

## 2017-06-14 DIAGNOSIS — O10012 Pre-existing essential hypertension complicating pregnancy, second trimester: Secondary | ICD-10-CM | POA: Diagnosis present

## 2017-06-14 DIAGNOSIS — O09292 Supervision of pregnancy with other poor reproductive or obstetric history, second trimester: Secondary | ICD-10-CM | POA: Diagnosis not present

## 2017-06-14 DIAGNOSIS — O09899 Supervision of other high risk pregnancies, unspecified trimester: Secondary | ICD-10-CM

## 2017-06-14 DIAGNOSIS — O24112 Pre-existing diabetes mellitus, type 2, in pregnancy, second trimester: Secondary | ICD-10-CM

## 2017-06-14 DIAGNOSIS — O34219 Maternal care for unspecified type scar from previous cesarean delivery: Secondary | ICD-10-CM

## 2017-06-15 ENCOUNTER — Ambulatory Visit (INDEPENDENT_AMBULATORY_CARE_PROVIDER_SITE_OTHER): Payer: Medicaid Other | Admitting: Obstetrics and Gynecology

## 2017-06-15 ENCOUNTER — Encounter: Payer: Medicaid Other | Attending: Obstetrics and Gynecology | Admitting: *Deleted

## 2017-06-15 ENCOUNTER — Ambulatory Visit: Payer: Medicaid Other | Admitting: *Deleted

## 2017-06-15 VITALS — BP 96/63 | HR 80 | Wt 147.6 lb

## 2017-06-15 DIAGNOSIS — O09892 Supervision of other high risk pregnancies, second trimester: Secondary | ICD-10-CM

## 2017-06-15 DIAGNOSIS — O09899 Supervision of other high risk pregnancies, unspecified trimester: Secondary | ICD-10-CM

## 2017-06-15 DIAGNOSIS — O24912 Unspecified diabetes mellitus in pregnancy, second trimester: Secondary | ICD-10-CM | POA: Insufficient documentation

## 2017-06-15 DIAGNOSIS — B373 Candidiasis of vulva and vagina: Secondary | ICD-10-CM

## 2017-06-15 DIAGNOSIS — Z713 Dietary counseling and surveillance: Secondary | ICD-10-CM | POA: Diagnosis not present

## 2017-06-15 DIAGNOSIS — Z98891 History of uterine scar from previous surgery: Secondary | ICD-10-CM

## 2017-06-15 DIAGNOSIS — Z3A00 Weeks of gestation of pregnancy not specified: Secondary | ICD-10-CM | POA: Diagnosis not present

## 2017-06-15 DIAGNOSIS — D219 Benign neoplasm of connective and other soft tissue, unspecified: Secondary | ICD-10-CM

## 2017-06-15 DIAGNOSIS — O10919 Unspecified pre-existing hypertension complicating pregnancy, unspecified trimester: Secondary | ICD-10-CM

## 2017-06-15 DIAGNOSIS — O10912 Unspecified pre-existing hypertension complicating pregnancy, second trimester: Secondary | ICD-10-CM

## 2017-06-15 DIAGNOSIS — D573 Sickle-cell trait: Secondary | ICD-10-CM

## 2017-06-15 DIAGNOSIS — B3731 Acute candidiasis of vulva and vagina: Secondary | ICD-10-CM

## 2017-06-15 DIAGNOSIS — K861 Other chronic pancreatitis: Secondary | ICD-10-CM

## 2017-06-15 MED ORDER — MICONAZOLE NITRATE 2 % VA CREA: 1.0000 | TOPICAL_CREAM | Freq: Every day | VAGINAL | 2 refills | Status: DC

## 2017-06-15 NOTE — Progress Notes (Signed)
Patient was seen on 06/15/2017 for  type 2 Diabetes and pregnancy self-management. She states EDD 11/12/2017. Patient states history of this diabetes for 7 years and she has had 2 pregnancies while having diabetes .  She states she had a pancreatic cyst in 2012 which was removed along with the tail of her pancreas. She does produce insulin but has been on Metformin for several years and occasionally Glyburide, which she is currently on. Diet history obtained. Patient eats good variety of all food groups and beverages include water and caffeine free diet soda occasionally.  Her comfort level with carb counting is 9/10. 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 She states she has hypoglycemia with glyburide occasionally when she doesn't eat enough carbs with her meals, so she is trying to eat more carbs now.  The following learning objectives were met by the patient :   States the definition of type 2 Diabetes and pregnancy   States why dietary management is important in controlling blood glucose  Describes the effects of carbohydrates on blood glucose levels  Demonstrates ability to create a balanced meal plan  Demonstrates carbohydrate counting   States when to check blood glucose levels  Demonstrates proper blood glucose monitoring techniques  States the effect of stress and exercise on blood glucose levels  States the importance of limiting caffeine and abstaining from alcohol and smoking  Plan:   Aim for 2-3 Carb Choices per meal (45 grams)   Aim for 1-2 Carbs per snack  Begin reading food labels for Total Carbohydrate of foods  Continue with your activity level by walking or other activity daily as tolerated  Continue checking BG before breakfast and 2 hours after first bite of breakfast, lunch and dinner as directed by MD   Bring Log Book/Sheet to every medical appointment  OR  Patient was introduced to [employer], she plans to use as record of BG electronically   Take medication as directed by MD  Patient already has a meter: [employer] And is testing pre breakfast and 2 hours each meal as directed by MD Patient reports BG are within target ranges both fasting and after meals Most recent A1c was 5.6% on 03/25/2017  Patient instructed to monitor glucose levels: FBS: 60 - 95 mg/dl 2 hour: <120 mg/dl  Patient received the following handouts:  Nutrition Diabetes and Pregnancy  Carbohydrate Counting List  Patient will be seen for follow-up in 1.5 months or as needed.

## 2017-06-15 NOTE — Progress Notes (Signed)
Prenatal Visit Note Date: 06/15/2017 Clinic: [facility]  Subjective:  [name] is a 32 y.o. [id number] at [redacted]w[redacted]d being seen today for ongoing prenatal care.  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  Patient reports vaginal irritation.  Had it in the past and cleared up with an OTC cream but forgot what it was.  Contractions: Not present. Vag. Bleeding: None.  Movement: Present. Denies leaking of fluid.   The following portions of the patient's history were reviewed and updated as appropriate: allergies, current medications, past family history, past medical history, past social history, past surgical history and problem list. Problem list updated.  Objective:   Vitals:   06/15/17 1034  BP: 96/63  Pulse: 80  Weight: 147 lb 9.6 oz (67 kg)    Fetal Status: Fetal Heart Rate (bpm): 131   Movement: Present     General:  Alert, oriented and cooperative. Patient is in no acute distress.  Skin: Skin is warm and dry. No rash noted.   Cardiovascular: Normal heart rate noted  Respiratory: Normal respiratory effort, no problems with respiration noted  Abdomen: Soft, gravid, appropriate for gestational age. Pain/Pressure: Present     Pelvic:  Cervical exam deferred        Right vulva down to rectum with erythema, slightly ttp.   Extremities: Normal range of motion.  Edema: None  Mental Status: Normal mood and affect. Normal behavior. Normal judgment and thought content.   Urinalysis:      Assessment and Plan:  Pregnancy: M1D6222 at [redacted]w[redacted]d  1. Vulvovaginal candidiasis Monistat 7. Pt  told if doesn't help to let us know. Can trying something like mycolog  2. Chronic hypertension during pregnancy, antepartum Doing well on no meds. Continue low dose asa  3. Other chronic pancreatitis ([place]) Doing well on no meds.  4. Diabetes mellitus affecting pregnancy in second trimester On glyburide 1.25 with breakfast and dinner. Normal values except when she "cheats" on the weekends when her BF, who's a truck driver is home and her 2hr PP can be in the upper 100s. I told her I'd recommend doing prn short acting insulin but she doesn't want to do this for now, so I said to do a full glyburide pill and check a 2am-3am BS value. Continue with serial growth u/s. Set up for fetal echo in about 6wks.   5. Supervision of other high risk pregnancy, antepartum Routine care. Has completion anatomy u/s already scheduled  6. History of cesarean delivery   7. Fibroid   8. Hemoglobin S trait (HCC) ucx nv.   Preterm labor symptoms and general obstetric precautions including but not limited to vaginal bleeding, contractions, leaking of fluid and fetal movement were reviewed in detail with the patient. Please refer to After Visit Summary for other counseling recommendations.  Return in about 3 weeks (around 07/06/2017) for hrob.   Aletha Halim, MD

## 2017-06-15 NOTE — Progress Notes (Signed)
Pt complains of very dry skin around vulvar region, makes intercourse painful/impossible

## 2017-07-06 ENCOUNTER — Encounter: Payer: Medicaid Other | Admitting: Obstetrics & Gynecology

## 2017-07-19 ENCOUNTER — Encounter (HOSPITAL_COMMUNITY): Payer: Self-pay

## 2017-07-19 ENCOUNTER — Other Ambulatory Visit (HOSPITAL_COMMUNITY): Payer: Self-pay | Admitting: Maternal and Fetal Medicine

## 2017-07-19 ENCOUNTER — Other Ambulatory Visit (HOSPITAL_COMMUNITY): Payer: Self-pay | Admitting: *Deleted

## 2017-07-19 ENCOUNTER — Ambulatory Visit (HOSPITAL_COMMUNITY)
Admission: RE | Admit: 2017-07-19 | Discharge: 2017-07-19 | Disposition: A | Discharge status: Home / Self Care | Payer: Medicaid Other | Source: Ambulatory Visit | Attending: Internal Medicine | Admitting: Internal Medicine

## 2017-07-19 DIAGNOSIS — O10919 Unspecified pre-existing hypertension complicating pregnancy, unspecified trimester: Secondary | ICD-10-CM | POA: Diagnosis present

## 2017-07-19 DIAGNOSIS — Z3A23 23 weeks gestation of pregnancy: Secondary | ICD-10-CM | POA: Diagnosis not present

## 2017-07-19 DIAGNOSIS — O09299 Supervision of pregnancy with other poor reproductive or obstetric history, unspecified trimester: Secondary | ICD-10-CM | POA: Insufficient documentation

## 2017-07-19 DIAGNOSIS — O3412 Maternal care for benign tumor of corpus uteri, second trimester: Secondary | ICD-10-CM | POA: Insufficient documentation

## 2017-07-19 DIAGNOSIS — O24112 Pre-existing diabetes mellitus, type 2, in pregnancy, second trimester: Secondary | ICD-10-CM

## 2017-07-19 DIAGNOSIS — K861 Other chronic pancreatitis: Secondary | ICD-10-CM

## 2017-07-19 DIAGNOSIS — D219 Benign neoplasm of connective and other soft tissue, unspecified: Secondary | ICD-10-CM

## 2017-07-19 DIAGNOSIS — O09899 Supervision of other high risk pregnancies, unspecified trimester: Secondary | ICD-10-CM

## 2017-07-19 NOTE — Addendum Note (Signed)
Encounter addended by: Elisabeth Cara, RT on: 07/19/2017 2:30 PM  Actions taken: Imaging Exam ended

## 2017-07-20 ENCOUNTER — Ambulatory Visit (INDEPENDENT_AMBULATORY_CARE_PROVIDER_SITE_OTHER): Payer: Medicaid Other | Admitting: Obstetrics & Gynecology

## 2017-07-20 ENCOUNTER — Encounter: Payer: Self-pay | Admitting: *Deleted

## 2017-07-20 VITALS — BP 101/65 | HR 89 | Wt 152.4 lb

## 2017-07-20 DIAGNOSIS — O09899 Supervision of other high risk pregnancies, unspecified trimester: Secondary | ICD-10-CM

## 2017-07-20 DIAGNOSIS — O26892 Other specified pregnancy related conditions, second trimester: Secondary | ICD-10-CM

## 2017-07-20 DIAGNOSIS — Z302 Encounter for sterilization: Secondary | ICD-10-CM

## 2017-07-20 DIAGNOSIS — O24912 Unspecified diabetes mellitus in pregnancy, second trimester: Secondary | ICD-10-CM

## 2017-07-20 DIAGNOSIS — R102 Pelvic and perineal pain: Secondary | ICD-10-CM

## 2017-07-20 DIAGNOSIS — K861 Other chronic pancreatitis: Secondary | ICD-10-CM

## 2017-07-20 DIAGNOSIS — O10919 Unspecified pre-existing hypertension complicating pregnancy, unspecified trimester: Secondary | ICD-10-CM

## 2017-07-20 MED ORDER — CYCLOBENZAPRINE HCL 10 MG PO TABS: 10.0000 mg | ORAL_TABLET | Freq: Three times a day (TID) | ORAL | 1 refills | Status: DC | PRN

## 2017-07-20 NOTE — Progress Notes (Signed)
PRENATAL VISIT NOTE  Subjective:  [name] is a 32 y.o. D5H2992 at [redacted]w[redacted]d being seen today for ongoing prenatal care.  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  Patient reports pelvic pain.  Contractions: Irritability. Vag. Bleeding: None.  Movement: Present. Denies leaking of fluid.   The following portions of the patient's history were reviewed and updated as appropriate: allergies, current medications, past family history, past medical history, past social history, past surgical history and problem list. Problem list updated.  Objective:   Vitals:   07/20/17 1341  BP: 101/65  Pulse: 89  Weight: 152 lb 6.4 oz (69.1 kg)    Fetal Status: Fetal Heart Rate (bpm): 139   Movement: Present     General:  Alert, oriented and cooperative. Patient is in no acute distress.  Skin: Skin is warm and dry. No rash noted.   Cardiovascular: Normal heart rate noted  Respiratory: Normal respiratory effort, no problems with respiration noted  Abdomen: Soft, gravid, appropriate for gestational age.  Pain/Pressure: Present     Pelvic: Cervical exam deferred        Extremities: Normal range of motion.  Edema: None  Mental Status: Normal mood and affect. Normal behavior. Normal judgment and thought content.    [place] Mfm Ob Follow Up  Result Date: 07/19/2017 ----------------------------------------------------------------------  OBSTETRICS REPORT                      (Signed Final 07/19/2017 02:25 pm) ---------------------------------------------------------------------- Patient Info  ID #:        [id number]                          D.O.B.:  [birth year] (32 yrs)  Name:       [name]             Visit Date: 07/19/2017 01:21 pm ---------------------------------------------------------------------- Performed By  Performed By:     Elisabeth Cara        Ref. Address:     [facility]                                                             [address]  [place], [place]  Attending:        Griffin Dakin MD         Location:         [facility]  Referred By:      [facility] for                    [place] ---------------------------------------------------------------------- Orders   #  Description                                 Code   1  [place] MFM OB FOLLOW UP                         [phone]  ----------------------------------------------------------------------   #  Ordered By               Order #        Accession #    Episode #   1  Jolyn Lent           [id number]      [id number]     [id number]  ---------------------------------------------------------------------- Indications   [redacted] weeks gestation of pregnancy                Z3A.23   History of sickle cell trait (FOB & Fetus +    Z86.2   sickle cell trait) CVS   Hypertension - Chronic/Pre-existing            O10.019   History of cesarean delivery, currently        O35.219   pregnant   Pre-existing diabetes, type 2, in pregnancy,   O24.112   second trimester   Poor obstetric history: Previous preeclampsia  O09.299   Poor obstetric history: Previous gestational   O09.299   HTN   Uterine fibroids                               O34.10   ---------------------------------------------------------------------- OB History  Gravidity:    7         Term:   2         SAB:   3  TOP:          1        Living:  2 ---------------------------------------------------------------------- Fetal Evaluation  Num Of Fetuses:     1  Fetal Heart  151  Rate(bpm):  Cardiac Activity:   Observed  Presentation:       Transverse, head to maternal left  Placenta:           Fundal, above cervical os  P. Cord Insertion:  Previously Visualized  Amniotic Fluid  AFI FV:      Subjectively within normal limits                              Largest Pocket(cm)                              6.88 ---------------------------------------------------------------------- Biometry  BPD:      58.1  mm     G. Age:  23w 6d         59  %    CI:        74.11   %    70 - 86                                                          FL/HC:      18.5   %    19.2 - 20.8  HC:      214.3  mm     G. Age:  23w 4d         37  %    HC/AC:      1.14        1.05 - 1.21  AC:      188.7  mm     G. Age:  23w 5d         48  %    FL/BPD:     68.3   %    71 - 87  FL:       39.7  mm     G. Age:  22w 5d         20  %    FL/AC:      21.0   %    20 - 24  HUM:      37.9  mm     G. Age:  23w 2d         41  %  Est. FW:     583  gm      1 lb 5 oz     51  % ---------------------------------------------------------------------- Gestational Age  LMP:           23w 3d        Date:  02/05/17                 EDD:   11/12/17  U/S Today:     23w 3d                                        EDD:   11/12/17  Best:          23w 3d     Det. By:  LMP  (02/05/17)          EDD:   11/12/17 ---------------------------------------------------------------------- Anatomy  Cranium:  Previously seen        Aortic Arch:            Previously seen  Cavum:                 Previously seen        Ductal Arch:            Previously seen  Ventricles:            Appears normal         Diaphragm:              Previously seen  Choroid Plexus:         Previously seen        Stomach:                Appears normal, left                                                                        sided  Cerebellum:            Previously seen        Abdomen:                Appears normal  Posterior Fossa:       Previously seen        Abdominal Wall:         Previously seen  Nuchal Fold:           Not applicable (>29    Cord Vessels:           Previously seen                         wks GA)  Face:                  Orbits and profile     Kidneys:                Appear normal                         previously seen  Lips:                  Previously seen        Bladder:                Appears normal  Thoracic:              Appears normal         Spine:                  Not well visualized  Heart:                 Appears normal         Upper Extremities:      Previously seen                         (4CH, axis, and situs  RVOT:                  Appears normal  Lower Extremities:      Previously seen  LVOT:                  Appears normal  Other:  Female gender. Heels and 5th digit previously visualized. Open          hands previously visualized. Nasal bone previously visualized.          Technically difficult due to fetal position. ---------------------------------------------------------------------- Cervix Uterus Adnexa  Cervix  Length:           3.99  cm.  Normal appearance by transabdominal scan.  Uterus  No abnormality visualized.  Left Ovary  Within normal limits.  Right Ovary  Within normal limits.  Adnexa:       No abnormality visualized. No adnexal mass                visualized. ---------------------------------------------------------------------- Myomas   Site                     L(cm)      W(cm)      D(cm)      Location   Anterior                 5.6        4.6        4.4  ----------------------------------------------------------------------   Blood Flow                 RI        PI       Comments   ---------------------------------------------------------------------- Impression  Singleton intrauterine pregnancy at 23+3 weeks with Type 2  DM, CHTN and fibroids here for completion of the anatomic  survey and growth evaluation  Review of the anatomy shows no sonographic markers for  aneuploidy or structural anomalies  However, views of the spine remain suboptimal secondary to  fetal position  Amniotic fluid volume is normal  Estimated fetal weight shows growth in the 51st percentile  The previously noted anterior fibroid is unchanged on today's  scan ---------------------------------------------------------------------- Recommendations  Recommend follow-up ultrasound examination in 4 weeks for  growth evaluation ----------------------------------------------------------------------                 Griffin Dakin, MD Electronically Signed Final Report   07/19/2017 02:25 pm ----------------------------------------------------------------------   Assessment and Plan:  Pregnancy: [id number] at [redacted]w[redacted]d  1. Diabetes mellitus affecting pregnancy in second trimester CBGs within range. Will check HgA1C today. Continue serial scans. Fetal ECHO scheduled for later this month; patient will get eye exam soon. - Hemoglobin A1c  2. Chronic hypertension during pregnancy, antepartum Continue ASA. Stable BP. - Comprehensive metabolic panel  3. Chronic pancreatitis, unspecified pancreatitis type ([place]) Surveillance lipase to be checked today - Lipase  4. Pelvic pain affecting pregnancy in second trimester, antepartum Encouraged maternity belt, Tylenol. Declines Flexeril.  - Culture, OB Urine  5. Request for sterilization Papers signed today.  6. Supervision of other high risk pregnancy, antepartum Preterm labor symptoms and general obstetric precautions including but not limited to vaginal bleeding, contractions, leaking of fluid and fetal movement were reviewed in detail with the patient. Please refer to After  Visit Summary for other counseling recommendations.  Return in about 2 weeks (around 08/03/2017) for OB Visit ([place]).  Future Appointments  Date Time Provider [place]  08/16/2017  1:00 PM WH-MFC [place] 3 WH-MFCUS MFC-US    Verita Schneiders, MD

## 2017-07-20 NOTE — Progress Notes (Signed)
Pt states a lot of pain & soreness in Pelvic area and tail bone.

## 2017-07-20 NOTE — Patient Instructions (Signed)
Return to clinic for any scheduled appointments or obstetric concerns, or go to [facility] for evaluation  

## 2017-07-21 LAB — HEMOGLOBIN A1C
ESTIMATED AVERAGE GLUCOSE: 108 mg/dL
HEMOGLOBIN A1C: 5.4 % (ref 4.8–5.6)

## 2017-07-21 LAB — COMPREHENSIVE METABOLIC PANEL
A/G RATIO: 1.3 (ref 1.2–2.2)
ALT: 7 IU/L (ref 0–32)
AST: 11 IU/L (ref 0–40)
Albumin: 3.6 g/dL (ref 3.5–5.5)
Alkaline Phosphatase: 59 IU/L (ref 39–117)
BUN / CREAT RATIO: 11 (ref 9–23)
BUN: 6 mg/dL (ref 6–20)
Bilirubin Total: <0.2 mg/dL (ref 0.0–1.2)
CALCIUM: 9 mg/dL (ref 8.7–10.2)
CO2: 20 mmol/L (ref 20–29)
Chloride: 105 mmol/L (ref 96–106)
Creatinine, Ser: 0.53 mg/dL — ABNORMAL LOW (ref 0.57–1.00)
GFR calc Af Amer: 144 mL/min/{1.73_m2} (ref >59)
GFR calc non Af Amer: 125 mL/min/{1.73_m2} (ref >59)
GLUCOSE: 68 mg/dL (ref 65–99)
Globulin, Total: 2.7 g/dL (ref 1.5–4.5)
POTASSIUM: 3.9 mmol/L (ref 3.5–5.2)
SODIUM: 137 mmol/L (ref 134–144)
TOTAL PROTEIN: 6.3 g/dL (ref 6.0–8.5)

## 2017-07-21 LAB — LIPASE: LIPASE: 15 U/L (ref 14–72)

## 2017-07-22 LAB — URINE CULTURE, OB REFLEX: ORGANISM ID, BACTERIA: NO GROWTH

## 2017-07-22 LAB — CULTURE, OB URINE

## 2017-08-16 ENCOUNTER — Other Ambulatory Visit: Payer: Self-pay | Admitting: Obstetrics & Gynecology

## 2017-08-16 ENCOUNTER — Other Ambulatory Visit (HOSPITAL_COMMUNITY): Payer: Self-pay | Admitting: *Deleted

## 2017-08-16 ENCOUNTER — Encounter (HOSPITAL_COMMUNITY): Payer: Self-pay

## 2017-08-16 ENCOUNTER — Other Ambulatory Visit (HOSPITAL_COMMUNITY): Payer: Self-pay | Admitting: Obstetrics and Gynecology

## 2017-08-16 ENCOUNTER — Ambulatory Visit (INDEPENDENT_AMBULATORY_CARE_PROVIDER_SITE_OTHER): Payer: Medicaid Other | Admitting: Obstetrics & Gynecology

## 2017-08-16 ENCOUNTER — Ambulatory Visit (HOSPITAL_COMMUNITY)
Admission: RE | Admit: 2017-08-16 | Discharge: 2017-08-16 | Disposition: A | Discharge status: Home / Self Care | Payer: Medicaid Other | Source: Ambulatory Visit | Attending: Obstetrics & Gynecology | Admitting: Obstetrics & Gynecology

## 2017-08-16 VITALS — BP 94/55 | HR 83 | Wt 154.0 lb

## 2017-08-16 DIAGNOSIS — Z8759 Personal history of other complications of pregnancy, childbirth and the puerperium: Secondary | ICD-10-CM

## 2017-08-16 DIAGNOSIS — O24112 Pre-existing diabetes mellitus, type 2, in pregnancy, second trimester: Secondary | ICD-10-CM

## 2017-08-16 DIAGNOSIS — O10012 Pre-existing essential hypertension complicating pregnancy, second trimester: Secondary | ICD-10-CM | POA: Diagnosis present

## 2017-08-16 DIAGNOSIS — D573 Sickle-cell trait: Secondary | ICD-10-CM

## 2017-08-16 DIAGNOSIS — D259 Leiomyoma of uterus, unspecified: Secondary | ICD-10-CM

## 2017-08-16 DIAGNOSIS — O09292 Supervision of pregnancy with other poor reproductive or obstetric history, second trimester: Secondary | ICD-10-CM | POA: Diagnosis not present

## 2017-08-16 DIAGNOSIS — Z3A27 27 weeks gestation of pregnancy: Secondary | ICD-10-CM | POA: Diagnosis not present

## 2017-08-16 DIAGNOSIS — Z98891 History of uterine scar from previous surgery: Secondary | ICD-10-CM

## 2017-08-16 DIAGNOSIS — O34219 Maternal care for unspecified type scar from previous cesarean delivery: Secondary | ICD-10-CM | POA: Diagnosis not present

## 2017-08-16 DIAGNOSIS — K861 Other chronic pancreatitis: Secondary | ICD-10-CM

## 2017-08-16 DIAGNOSIS — O3410 Maternal care for benign tumor of corpus uteri, unspecified trimester: Secondary | ICD-10-CM | POA: Diagnosis not present

## 2017-08-16 DIAGNOSIS — O3412 Maternal care for benign tumor of corpus uteri, second trimester: Secondary | ICD-10-CM

## 2017-08-16 DIAGNOSIS — O24119 Pre-existing diabetes mellitus, type 2, in pregnancy, unspecified trimester: Secondary | ICD-10-CM

## 2017-08-16 DIAGNOSIS — O09899 Supervision of other high risk pregnancies, unspecified trimester: Secondary | ICD-10-CM

## 2017-08-16 DIAGNOSIS — O10919 Unspecified pre-existing hypertension complicating pregnancy, unspecified trimester: Secondary | ICD-10-CM

## 2017-08-16 NOTE — Patient Instructions (Signed)

## 2017-08-16 NOTE — Progress Notes (Signed)
Pt states during [place] today had to stop due to her being very dizzy & lightheaded. She checked her Blood Sugar and it was 98 fasting this am,110 2 hrs after breakfast and 121 after lunch. Concerned that her BP was low at 94/55.

## 2017-08-16 NOTE — Progress Notes (Signed)
   PRENATAL VISIT NOTE  Subjective:  [name] is a 32 y.o. [id number] at [redacted]w[redacted]d being seen today for ongoing prenatal care.  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  Patient reports increased abdominal pain c/w pancreatitis.  Contractions: Irritability. Vag. Bleeding: None.  Movement: Present. Denies leaking of fluid.   The following portions of the patient's history were reviewed and updated as appropriate: allergies, current medications, past family history, past medical history, past social history, past surgical history and problem list. Problem list updated.  Objective:   Vitals:   08/16/17 1526  BP: (!) 94/55  Pulse: 83  Weight: 154 lb (69.9 kg)    Fetal Status: Fetal Heart Rate (bpm): 136   Movement: Present     General:  Alert, oriented and cooperative. Patient is in no acute distress.  Skin: Skin is warm and dry. No rash noted.   Cardiovascular: Normal heart rate noted  Respiratory: Normal respiratory effort, no problems with respiration noted  Abdomen: Soft, gravid, appropriate for gestational age.  Pain/Pressure: Present     Pelvic: Cervical exam deferred        Extremities: Normal range of motion.  Edema: None  Mental Status: Normal mood and affect. Normal behavior. Normal judgment and thought content.   Assessment and Plan:  Pregnancy: [id number] at [redacted]w[redacted]d  1. Supervision of other high risk pregnancy, antepartum Repeat labs - CBC - HIV antibody - RPR - Lipase - Amylase - CBC  2. Chronic pancreatitis, unspecified pancreatitis type ([place]) Labs, states  her BG levels are nl on glyburide. Occasional dizziness noted  Preterm labor symptoms and general obstetric precautions including but not limited to vaginal bleeding, contractions, leaking of fluid and fetal movement were reviewed in detail with the patient. Please refer to After Visit Summary for other counseling recommendations.  Return in about 2 weeks (around 08/30/2017).  Future Appointments  Date Time Provider [place]  09/15/2017  1:30 PM WH-MFC [place] 5 WH-MFCUS MFC-US  Message sent to Dr Hilarie Fredrickson re GI f/u   [name], MD

## 2017-08-17 ENCOUNTER — Telehealth: Payer: Self-pay | Admitting: *Deleted

## 2017-08-17 LAB — RPR: RPR: NONREACTIVE

## 2017-08-17 LAB — CBC
HEMOGLOBIN: 10.5 g/dL — AB (ref 11.1–15.9)
Hematocrit: 30.7 % — ABNORMAL LOW (ref 34.0–46.6)
MCH: 24.5 pg — AB (ref 26.6–33.0)
MCHC: 34.2 g/dL (ref 31.5–35.7)
MCV: 72 fL — AB (ref 79–97)
Platelets: 423 10*3/uL (ref 150–450)
RBC: 4.29 x10E6/uL (ref 3.77–5.28)
RDW: 15.8 % — ABNORMAL HIGH (ref 12.3–15.4)
WBC: 9.2 10*3/uL (ref 3.4–10.8)

## 2017-08-17 LAB — HIV ANTIBODY (ROUTINE TESTING W REFLEX): HIV Screen 4th Generation wRfx: NONREACTIVE

## 2017-08-17 LAB — AMYLASE: Amylase: 54 U/L (ref 31–124)

## 2017-08-17 LAB — LIPASE: Lipase: 17 U/L (ref 14–72)

## 2017-08-17 NOTE — Telephone Encounter (Signed)
We have received correspondence from Rosebud Poles, BSN with the [facility] requesting patient be seen by our practice again for recurrent pancreatitis symptoms (last seen by [place] 2017). Dr Hilarie Fredrickson has agreed to see patient. She, however, could not come to be seen today as our schedule would allow.  In addition, she only has pregnancy medicaid at this time which our office does not typically take and patient states she is not sure she can afford to come for an appointment. Advised that per our director, she will be charged as self pay which will give her a 50 percent reduction and she can make payment arrangements with [facility] after that. Patient verbalizes understanding. Patient has scheduled an appointment with Nicoletta Ba, PA-C for 08/23/17 at 2:15 pm.

## 2017-08-23 ENCOUNTER — Telehealth: Payer: Self-pay | Admitting: *Deleted

## 2017-08-23 ENCOUNTER — Encounter: Payer: Self-pay | Admitting: Physician Assistant

## 2017-08-23 ENCOUNTER — Ambulatory Visit (INDEPENDENT_AMBULATORY_CARE_PROVIDER_SITE_OTHER): Payer: Self-pay | Admitting: Physician Assistant

## 2017-08-23 VITALS — BP 90/48 | HR 96 | Ht 60.0 in | Wt 157.0 lb

## 2017-08-23 DIAGNOSIS — K8689 Other specified diseases of pancreas: Secondary | ICD-10-CM

## 2017-08-23 DIAGNOSIS — Z8719 Personal history of other diseases of the digestive system: Secondary | ICD-10-CM

## 2017-08-23 DIAGNOSIS — R1013 Epigastric pain: Secondary | ICD-10-CM

## 2017-08-23 MED ORDER — OXYCODONE HCL 5 MG PO TABS: ORAL_TABLET | ORAL | 0 refills | Status: DC

## 2017-08-23 NOTE — Telephone Encounter (Signed)
[place] at [facility], office of Dr. Minerva Fester.  I wanted to report to them the blood pressure reading of this patient today.  It was 90/48.  I spoke to [place] and she said that was standard for this patient and thanked me for calling to let them know.

## 2017-08-23 NOTE — Progress Notes (Addendum)
Subjective:    Patient ID: [name], female    DOB: [birth year], 32 y.o.   MRN: [id number]  HPI [name] is a pleasant 32 year old African-American female, known to Dr. Hilarie Fredrickson who was last seen in our office in March 2017.  She is referred back today by OB/GYN/Dr. Roselie Awkward for evaluation of recurrent abdominal pain, and patient with history of recurrent pancreatitis who is currently [redacted] weeks pregnant with her third child. 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  She underwent distal pancreatectomy and splenectomy. Since then she has had recurrent episodes of epigastric pain at times radiating to her back and reminiscent of similar episodes of pancreatitis. Previous MRCP was unremarkable.  She had EUS done in April 2017 per Dr. Ardis Hughs to rule out any evidence for chronic pancreatitis.  This was normal with no evidence of chronic pancreatitis of the remaining pancreas, gallbladder was normal as well as common bile duct. Patient had been maintained on Creon 2 p.o. with each meal but stopped that when she learned she was pregnant. She says that she has had an increase in episodes of abdominal pain while pregnant especially over the past few weeks.  She has been hesitant to take any pain medication and is also been hesitant to take Creon because she was told by OB/GYN but it was not clear whether or not this could have any effect on her baby.  She has been trying to eat low-fat but has not always been compliant with that and admits she has been eating fast food off and on pizza etc. Has had a normal amount of weight gain with pregnancy has not had any vomiting but does have occasional nausea.  She says most of her episodes of pain last for 20 or 30 minutes and then subside occasionally has an episode last for an hour or 2 he had one episode very early in the pregnancy but that lasted for 3 to 4 days but has not had any since. Patient also has  history of sickle cell trait, asthma and has history of preeclampsia with previous pregnancies.  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  Not had any abdominal imaging since EUS in 2017 Amylase and lipase have been checked recently and were normal as were LFTs.   ROS; Pertinent positive and negative review of systems were noted in the above HPI section.  All other review of systems was otherwise negative.  Outpatient Encounter Medications as of 08/23/2017  Medication Sig  . ACCU-CHEK FASTCLIX LANCETS MISC 1 each by Percutaneous route 4 (four) times daily.  Marland Kitchen acetaminophen (TYLENOL) 500 MG tablet Take 1,000 mg by mouth every 6 (six) hours as needed for mild pain, moderate pain or headache.   Marland Kitchen aspirin EC 81 MG tablet Take 1 tablet (81 mg total) by mouth daily. Take after 12 weeks for prevention of preeclampsia later in pregnancy  . calcium carbonate (TUMS - DOSED IN MG ELEMENTAL CALCIUM) 500 MG chewable tablet Chew 2 tablets by mouth 2 (two) times daily as needed for indigestion or heartburn.  . folic acid (FOLVITE) 1 MG tablet Take 1 tablet (1 mg total) by mouth daily.  Marland Kitchen glucose blood (ACCU-CHEK GUIDE) test strip 1 each by Other route 4 (four) times daily. Use as instructed  . glyBURIDE (DIABETA) 2.5 MG tablet Take 0.5 tablets (1.25 mg total) by mouth 2 (two) times daily with a meal.  . polyethylene glycol powder (GLYCOLAX/MIRALAX) powder  Take 17 g by mouth daily.  . Prenatal Vit-Fe Fumarate-FA (PRENATAL VITAMIN PO) Take by mouth.  . oxyCODONE (ROXICODONE) 5 MG immediate release tablet Take 1/2 tab by mouth every 6-8 hours as needed for pain.  . [DISCONTINUED] glucose blood (ACCU-CHEK AVIVA) test strip Use as instructed  . [DISCONTINUED] oxyCODONE (ROXICODONE) 5 MG immediate release tablet Take 1 tab by mouth every 6-8 hours as needed for pain.   No facility-administered encounter medications on file as of 08/23/2017.    Allergies  Allergen Reactions  . Dilaudid  [Hydromorphone Hcl] Itching and Other (See Comments)    Reaction:  Hallucinations   Patient Active Problem List   Diagnosis Date Noted  . Request for sterilization 07/20/2017  . Fibroid 05/08/2017  . Pancreatitis 05/08/2017  . History of postpartum severe pre-eclampsia 04/10/2017  . Supervision of other high risk pregnancy, antepartum 04/10/2017  . History of cesarean delivery 04/10/2017  . Chronic hypertension during pregnancy, antepartum 08/13/2014  . 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 Hemoglobin S trait ([place]) 12/29/2013  . Vitamin D deficiency 12/29/2013  . Asthma 12/25/2013  . Genital warts 12/22/2011  . Pancreatic pseudocyst 06/24/2010   Social History   Socioeconomic History  . Marital status: Single    Spouse name: Not on file  . Number of children: 2  . Years of education: Not on file  . Highest education level: Not on file  Occupational History  . Occupation: Agricultural engineer  Social Needs  . Financial resource strain: Not on file  . Food insecurity:    Worry: Not on file    Inability: Not on file  . Transportation needs:    Medical: Not on file    Non-medical: Not on file  Tobacco Use  . Smoking status: Current Every Day Smoker    Packs/day: 0.50    Years: 13.00    Pack years: 6.50    Types: Cigarettes  . Smokeless tobacco: Never Used  . Tobacco comment: smokes 4 cigs/day, tobacco info given 04/17/15  Substance and Sexual Activity  . Alcohol use: No    Alcohol/week: 0.0 oz    Comment: social   . Drug use: No  . Sexual activity: Never    Partners: Male    Birth control/protection: None  Lifestyle  . Physical activity:    Days per week: Not on file    Minutes per session: Not on file  . Stress: Not on file  Relationships  . Social connections:    Talks on phone: Not on file    Gets together: Not on file    Attends religious service: Not on file    Active member of club or organization: Not on file    Attends meetings of clubs or organizations: Not on file     Relationship status: Not on file  . Intimate partner violence:    Fear of current or ex partner: Not on file    Emotionally abused: Not on file    Physically abused: Not on file    Forced sexual activity: Not on file  Other Topics Concern  . Not on file  Social History Narrative  . Not on file    Ms. Sanville's family history includes Asthma in her father; Diabetes in her mother; Hypertension in her mother.      Objective:    Vitals:   08/23/17 1410 08/23/17 1517  BP: (!) 88/50 (!) 90/48  Pulse: 96     Physical Exam; well-developed young African-American female in no acute  distress, pleasant blood pressure 90/48 pulse 96, height 5 foot, weight 157.  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 EUS in 2017 showed no evidence of chronic calcific pancreatitis, normal remaining pancreas, normal gallbladder and common bile duct  Patient has had some increase in frequency of episodes of epigastric pain and back pain during her current pregnancy especially over the past few weeks but these are brief in duration usually lasting 20 to 30 minutes and subsiding.  Thus far patient has not  required any analgesics, and has been off of Creon during her pregnancy  #2   28-week intrauterine pregnancy  Plan; We discussed importance of compliance with low-fat diet to minimize her episodes of pain. She has been hesitant to use oxycodone says sometimes the pain is bad enough that she has a hard time handling it.  She is very concerned about any regular use of narcotics and very cognizant about addiction potential. Given a prescription today for oxycodone 5 mg and asked to start with half a tablet p.o. every 6-8 hours as needed only for severe episodes of pain, #25 no refills She will remain off of Creon until after she delivers.  If she is advised that resuming Creon 2 p.o. with meals and 1 p.o. with snacks postpartum should be fine.  She is asked to follow-up in the fall with Dr. Hilarie Fredrickson or myself after she delivers her baby and at that time may consider repeat imaging of her pancreas. We are happy to see her in the interim as needed.  Sharon Stapel S Chadrick Sprinkle PA-C 08/23/2017   Addendum: Reviewed and agree with  management. Pyrtle, Lajuan Lines, MD     Cc: Nolene Ebbs, MD

## 2017-08-23 NOTE — Patient Instructions (Addendum)
We have provided you with a low fat diet.  We have given you a prescription to take to your pharmacy for Oxycodone 5 mg. Call in September to schedule an appoitnment with Dr. Hilarie Fredrickson.Su Hoff you are age 33 or younger, your body mass index should be between 19-25. Your Body mass index is 30.66 kg/m. If this is out of the aformentioned range listed, please consider follow up with your Primary Care Provider.

## 2017-09-05 ENCOUNTER — Ambulatory Visit (INDEPENDENT_AMBULATORY_CARE_PROVIDER_SITE_OTHER): Payer: Medicaid Other | Admitting: Obstetrics and Gynecology

## 2017-09-05 VITALS — BP 98/68 | HR 95 | Wt 155.9 lb

## 2017-09-05 DIAGNOSIS — O09899 Supervision of other high risk pregnancies, unspecified trimester: Secondary | ICD-10-CM

## 2017-09-05 DIAGNOSIS — K861 Other chronic pancreatitis: Secondary | ICD-10-CM

## 2017-09-05 DIAGNOSIS — O1415 Severe pre-eclampsia, complicating the puerperium: Secondary | ICD-10-CM

## 2017-09-05 DIAGNOSIS — O10919 Unspecified pre-existing hypertension complicating pregnancy, unspecified trimester: Secondary | ICD-10-CM

## 2017-09-05 DIAGNOSIS — Z302 Encounter for sterilization: Secondary | ICD-10-CM

## 2017-09-05 DIAGNOSIS — Z98891 History of uterine scar from previous surgery: Secondary | ICD-10-CM

## 2017-09-05 DIAGNOSIS — O24913 Unspecified diabetes mellitus in pregnancy, third trimester: Secondary | ICD-10-CM

## 2017-09-05 LAB — POCT URINALYSIS DIP (DEVICE)
Bilirubin Urine: NEGATIVE
Glucose, UA: NEGATIVE mg/dL
HGB URINE DIPSTICK: NEGATIVE
Ketones, ur: NEGATIVE mg/dL
LEUKOCYTES UA: NEGATIVE
Nitrite: NEGATIVE
Protein, ur: NEGATIVE mg/dL
SPECIFIC GRAVITY, URINE: 1.015 (ref 1.005–1.030)
UROBILINOGEN UA: 0.2 mg/dL (ref 0.0–1.0)
pH: 7.5 (ref 5.0–8.0)

## 2017-09-05 NOTE — Patient Instructions (Signed)
Preeclampsia and Eclampsia °Preeclampsia is a serious condition that develops only during pregnancy. It is also called toxemia of pregnancy. This condition causes high blood pressure along with other symptoms, such as swelling and headaches. These symptoms may develop as the condition gets worse. Preeclampsia may occur at 20 weeks of pregnancy or later. °Diagnosing and treating preeclampsia early is very important. If not treated early, it can cause serious problems for you and your baby. One problem it can lead to is eclampsia, which is a condition that causes muscle jerking or shaking (convulsions or seizures) in the mother. Delivering your baby is the best treatment for preeclampsia or eclampsia. Preeclampsia and eclampsia symptoms usually go away after your baby is born. °What are the causes? °The cause of preeclampsia is not known. °What increases the risk? °The following risk factors make you more likely to develop preeclampsia: °· Being pregnant for the first time. °· Having had preeclampsia during a past pregnancy. °· Having a family history of preeclampsia. °· Having high blood pressure. °· Being pregnant with twins or triplets. °· Being 35 or older. °· Being African-American. °· Having kidney disease or diabetes. °· Having medical conditions such as lupus or blood diseases. °· Being very overweight (obese). ° °What are the signs or symptoms? °The earliest signs of preeclampsia are: °· High blood pressure. °· Increased protein in your urine. Your health care provider will check for this at every visit before you give birth (prenatal visit). ° °Other symptoms that may develop as the condition gets worse include: °· Severe headaches. °· Sudden weight gain. °· Swelling of the hands, face, legs, and feet. °· Nausea and vomiting. °· Vision problems, such as blurred or double vision. °· Numbness in the face, arms, legs, and feet. °· Urinating less than usual. °· Dizziness. °· Slurred speech. °· Abdominal pain,  especially upper abdominal pain. °· Convulsions or seizures. ° °Symptoms generally go away after giving birth. °How is this diagnosed? °There are no screening tests for preeclampsia. Your health care provider will ask you about symptoms and check for signs of preeclampsia during your prenatal visits. You may also have tests that include: °· Urine tests. °· Blood tests. °· Checking your blood pressure. °· Monitoring your baby’s heart rate. °· Ultrasound. ° °How is this treated? °You and your health care provider will determine the treatment approach that is best for you. Treatment may include: °· Having more frequent prenatal exams to check for signs of preeclampsia, if you have an increased risk for preeclampsia. °· Bed rest. °· Reducing how much salt (sodium) you eat. °· Medicine to lower your blood pressure. °· Staying in the hospital, if your condition is severe. There, treatment will focus on controlling your blood pressure and the amount of fluids in your body (fluid retention). °· You may need to take medicine (magnesium sulfate) to prevent seizures. This medicine may be given as an injection or through an IV tube. °· Delivering your baby early, if your condition gets worse. You may have your labor started with medicine (induced), or you may have a cesarean delivery. ° °Follow these instructions at home: °Eating and drinking ° °· Drink enough fluid to keep your urine clear or pale yellow. °· Eat a healthy diet that is low in sodium. Do not add salt to your food. Check nutrition labels to see how much sodium a food or beverage contains. °· Avoid caffeine. °Lifestyle °· Do not use any products that contain nicotine or tobacco, such as cigarettes   and e-cigarettes. If you need help quitting, ask your health care provider. °· Do not use alcohol or drugs. °· Avoid stress as much as possible. Rest and get plenty of sleep. °General instructions °· Take over-the-counter and prescription medicines only as told by your  health care provider. °· When lying down, lie on your side. This keeps pressure off of your baby. °· When sitting or lying down, raise (elevate) your feet. Try putting some pillows underneath your lower legs. °· Exercise regularly. Ask your health care provider what kinds of exercise are best for you. °· Keep all follow-up and prenatal visits as told by your health care provider. This is important. °How is this prevented? °To prevent preeclampsia or eclampsia from developing during another pregnancy: °· Get proper medical care during pregnancy. Your health care provider may be able to prevent preeclampsia or diagnose and treat it early. °· Your health care provider may have you take a low-dose aspirin or a calcium supplement during your next pregnancy. °· You may have tests of your blood pressure and kidney function after giving birth. °· Maintain a healthy weight. Ask your health care provider for help managing weight gain during pregnancy. °· Work with your health care provider to manage any long-term (chronic) health conditions you have, such as diabetes or kidney problems. ° °Contact a health care provider if: °· You gain more weight than expected. °· You have headaches. °· You have nausea or vomiting. °· You have abdominal pain. °· You feel dizzy or light-headed. °Get help right away if: °· You develop sudden or severe swelling anywhere in your body. This usually happens in the legs. °· You gain 5 lbs (2.3 kg) or more during one week. °· You have severe: °? Abdominal pain. °? Headaches. °? Dizziness. °? Vision problems. °? Confusion. °? Nausea or vomiting. °· You have a seizure. °· You have trouble moving any part of your body. °· You develop numbness in any part of your body. °· You have trouble speaking. °· You have any abnormal bleeding. °· You pass out. °This information is not intended to replace advice given to you by your health care provider. Make sure you discuss any questions you have with your health  care provider. °Document Released: 02/19/2000 Document Revised: 10/20/2015 Document Reviewed: 09/28/2015 °Elsevier Interactive Patient Education © 2018 [employer] ° °

## 2017-09-05 NOTE — Progress Notes (Signed)
Patient reports low BP Reading and Feeling tired and Dizziness with the Low BP.

## 2017-09-05 NOTE — Progress Notes (Signed)
Subjective:  [name] is a 32 y.o. [id number] at [redacted]w[redacted]d being seen today for ongoing prenatal care.  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  Patient reports fatigue and lightheadedness. Feels as though her BP is to low. Had an episode where she thought she was going to pass out.  Contractions: Irritability. Vag. Bleeding: None.  Movement: Present. Denies leaking of fluid.   The following portions of the patient's history were reviewed and updated as appropriate: allergies, current medications, past family history, past medical history, past social history, past surgical history and problem list. Problem list updated.  Objective:   Vitals:   09/05/17 1312  BP: 98/68  Pulse: 95  Weight: 70.7 kg (155 lb 14.4 oz)    Fetal Status: Fetal Heart Rate (bpm): 139   Movement: Present     General:  Alert, oriented and cooperative. Patient is in no acute distress.  Skin: Skin is warm and dry. No rash noted.   Cardiovascular: Normal heart rate noted  Respiratory: Normal respiratory effort, no problems with respiration noted  Abdomen: Soft, gravid, appropriate for gestational age. Pain/Pressure: Present     Pelvic: Vag. Bleeding: None Vag D/C Character: White   Cervical exam deferred        Extremities: Normal range of motion.  Edema: None  Mental Status: Normal mood and affect. Normal behavior. Normal judgment and thought content.   Urinalysis: Urine Protein: Negative Urine Glucose: Negative  Assessment and Plan:  Pregnancy: [id number] at [redacted]w[redacted]d  1. Supervision of other high risk  pregnancy, antepartum Doing well. Continue routine care.   2. Chronic hypertension during pregnancy, antepartum BPs stable and actually on the low side. Only on ASA.   3. History of postpartum severe pre-eclampsia No PIH symptoms. Counseled on what to look for.   4. Diabetes mellitus affecting pregnancy in third trimester Controled with glyburide. Patient with fasting CBGs between 70-90 and postprandial <110. Will need to begin antenatal testing in 2 weeks. Has follow-up US scheduled. Echo completed.    5. History of cesarean delivery Two prior c/s x2. Plan for repeat.   6. Request for sterilization Tubal papers signed and scanned.   7. Chronic pancreatitis, unspecified pancreatitis type ([place]) Stable. Has followed with her GI doctors. Occasionally needs pain medication. Labs have been normal.   Preterm labor symptoms and general obstetric precautions including but not limited to vaginal bleeding, contractions, leaking of fluid and fetal movement were reviewed in detail with the patient. Please refer to After Visit Summary for other counseling recommendations.  Return in about 2 weeks (around 09/19/2017) for ob visit, NST/BPP.   Katheren Shams, DO

## 2017-09-15 ENCOUNTER — Ambulatory Visit (HOSPITAL_COMMUNITY)
Admission: RE | Admit: 2017-09-15 | Discharge: 2017-09-15 | Disposition: A | Discharge status: Home / Self Care | Payer: Medicaid Other | Source: Ambulatory Visit | Attending: Obstetrics & Gynecology | Admitting: Obstetrics & Gynecology

## 2017-09-15 ENCOUNTER — Encounter (HOSPITAL_COMMUNITY): Payer: Self-pay

## 2017-09-15 ENCOUNTER — Other Ambulatory Visit (HOSPITAL_COMMUNITY): Payer: Self-pay | Admitting: Maternal and Fetal Medicine

## 2017-09-15 DIAGNOSIS — O24119 Pre-existing diabetes mellitus, type 2, in pregnancy, unspecified trimester: Secondary | ICD-10-CM

## 2017-09-15 DIAGNOSIS — K861 Other chronic pancreatitis: Secondary | ICD-10-CM

## 2017-09-15 DIAGNOSIS — O24113 Pre-existing diabetes mellitus, type 2, in pregnancy, third trimester: Secondary | ICD-10-CM | POA: Diagnosis not present

## 2017-09-15 DIAGNOSIS — O10919 Unspecified pre-existing hypertension complicating pregnancy, unspecified trimester: Secondary | ICD-10-CM

## 2017-09-15 DIAGNOSIS — Z3A31 31 weeks gestation of pregnancy: Secondary | ICD-10-CM | POA: Diagnosis not present

## 2017-09-15 DIAGNOSIS — Z362 Encounter for other antenatal screening follow-up: Secondary | ICD-10-CM

## 2017-09-15 DIAGNOSIS — O09899 Supervision of other high risk pregnancies, unspecified trimester: Secondary | ICD-10-CM

## 2017-09-15 DIAGNOSIS — Z302 Encounter for sterilization: Secondary | ICD-10-CM

## 2017-09-15 DIAGNOSIS — O09293 Supervision of pregnancy with other poor reproductive or obstetric history, third trimester: Secondary | ICD-10-CM | POA: Diagnosis not present

## 2017-09-15 DIAGNOSIS — D219 Benign neoplasm of connective and other soft tissue, unspecified: Secondary | ICD-10-CM

## 2017-09-15 DIAGNOSIS — O10913 Unspecified pre-existing hypertension complicating pregnancy, third trimester: Secondary | ICD-10-CM | POA: Insufficient documentation

## 2017-09-18 ENCOUNTER — Other Ambulatory Visit (HOSPITAL_COMMUNITY): Payer: Self-pay | Admitting: *Deleted

## 2017-09-18 DIAGNOSIS — O24119 Pre-existing diabetes mellitus, type 2, in pregnancy, unspecified trimester: Secondary | ICD-10-CM

## 2017-09-18 DIAGNOSIS — Z7984 Long term (current) use of oral hypoglycemic drugs: Principal | ICD-10-CM

## 2017-09-20 ENCOUNTER — Ambulatory Visit (INDEPENDENT_AMBULATORY_CARE_PROVIDER_SITE_OTHER): Payer: Medicaid Other | Admitting: Obstetrics and Gynecology

## 2017-09-20 ENCOUNTER — Ambulatory Visit (INDEPENDENT_AMBULATORY_CARE_PROVIDER_SITE_OTHER): Payer: Medicaid Other | Admitting: *Deleted

## 2017-09-20 ENCOUNTER — Encounter: Payer: Self-pay | Admitting: Obstetrics and Gynecology

## 2017-09-20 ENCOUNTER — Ambulatory Visit: Payer: Self-pay

## 2017-09-20 VITALS — BP 96/62 | HR 93 | Wt 158.7 lb

## 2017-09-20 DIAGNOSIS — Z302 Encounter for sterilization: Secondary | ICD-10-CM

## 2017-09-20 DIAGNOSIS — O10919 Unspecified pre-existing hypertension complicating pregnancy, unspecified trimester: Secondary | ICD-10-CM

## 2017-09-20 DIAGNOSIS — O24913 Unspecified diabetes mellitus in pregnancy, third trimester: Secondary | ICD-10-CM

## 2017-09-20 DIAGNOSIS — O10913 Unspecified pre-existing hypertension complicating pregnancy, third trimester: Secondary | ICD-10-CM

## 2017-09-20 DIAGNOSIS — O09899 Supervision of other high risk pregnancies, unspecified trimester: Secondary | ICD-10-CM

## 2017-09-20 DIAGNOSIS — Z98891 History of uterine scar from previous surgery: Secondary | ICD-10-CM

## 2017-09-20 LAB — POCT URINALYSIS DIP (DEVICE)
GLUCOSE, UA: 100 mg/dL — AB
Hgb urine dipstick: NEGATIVE
Ketones, ur: 15 mg/dL — AB
Leukocytes, UA: NEGATIVE
Nitrite: NEGATIVE
PROTEIN: 30 mg/dL — AB
Specific Gravity, Urine: 1.02 (ref 1.005–1.030)
UROBILINOGEN UA: 0.2 mg/dL (ref 0.0–1.0)
pH: 7 (ref 5.0–8.0)

## 2017-09-20 NOTE — Progress Notes (Signed)
Subjective:  [name] is a 32 y.o. [id number] at [redacted]w[redacted]d being seen today for ongoing prenatal care.  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  Patient reports general discomforts of pregnancy.  Contractions: Irritability. Vag. Bleeding: None.  Movement: Present. Denies leaking of fluid.   The following portions of the patient's history were reviewed and updated as appropriate: allergies, current medications, past family history, past medical history, past social history, past surgical history and problem list. Problem list updated.  Objective:   Vitals:   09/20/17 1316  BP: 96/62  Pulse: 93  Weight: 158 lb 11.2 oz (72 kg)    Fetal Status: Fetal Heart Rate (bpm): 130   Movement: Present     General:  Alert, oriented and cooperative. Patient is in no acute distress.  Skin: Skin is warm and dry. No rash noted.   Cardiovascular: Normal heart rate noted  Respiratory: Normal respiratory effort, no problems with respiration noted  Abdomen: Soft, gravid, appropriate for gestational age. Pain/Pressure: Present     Pelvic:  Cervical exam deferred        Extremities: Normal range of motion.  Edema: Trace  Mental Status: Normal mood and affect. Normal behavior. Normal judgment and thought content.   Urinalysis:      Assessment and Plan:  Pregnancy: [id number] at [redacted]w[redacted]d  1. Supervision of other high risk pregnancy, antepartum Stable  2. Chronic hypertension during pregnancy, antepartum BP stable without meds Continue with qd BASA BPP/NST today and continue weekly  3. Diabetes  mellitus affecting pregnancy in third trimester Pt did not bring CBG log Reports CBG's in goal range Continue with Glyburide Growth 49 % on 09/15/17, follow up scheduled BPP/NST today and continue weekly  4. Request for sterilization Papers signed  5. History of cesarean delivery For repeat at 39 weeks  Preterm labor symptoms and general obstetric precautions including but not limited to vaginal bleeding, contractions, leaking of fluid and fetal movement were reviewed in detail with the patient. Please refer to After Visit Summary for other counseling recommendations.  Return in about 2 weeks (around 10/04/2017) for OB visit.   Chancy Milroy, MD

## 2017-09-20 NOTE — Patient Instructions (Signed)
Third Trimester of Pregnancy The third trimester is from week 28 through week 40 (months 7 through 9). The third trimester is a time when the unborn baby (fetus) is growing rapidly. At the end of the ninth month, the fetus is about 20 inches in length and weighs 6-10 pounds. Body changes during your third trimester Your body will continue to go through many changes during pregnancy. The changes vary from woman to woman. During the third trimester:  Your weight will continue to increase. You can expect to gain 25-35 pounds (11-16 kg) by the end of the pregnancy.  You may begin to get stretch marks on your hips, abdomen, and breasts.  You may urinate more often because the fetus is moving lower into your pelvis and pressing on your bladder.  You may develop or continue to have heartburn. This is caused by increased hormones that slow down muscles in the digestive tract.  You may develop or continue to have constipation because increased hormones slow digestion and cause the muscles that push waste through your intestines to relax.  You may develop hemorrhoids. These are swollen veins (varicose veins) in the rectum that can itch or be painful.  You may develop swollen, bulging veins (varicose veins) in your legs.  You may have increased body aches in the pelvis, back, or thighs. This is due to weight gain and increased hormones that are relaxing your joints.  You may have changes in your hair. These can include thickening of your hair, rapid growth, and changes in texture. Some women also have hair loss during or after pregnancy, or hair that feels dry or thin. Your hair will most likely return to normal after your baby is born.  Your breasts will continue to grow and they will continue to become tender. A yellow fluid (colostrum) may leak from your breasts. This is the first milk you are producing for your baby.  Your belly button may stick out.  You may notice more swelling in your hands,  face, or ankles.  You may have increased tingling or numbness in your hands, arms, and legs. The skin on your belly may also feel numb.  You may feel short of breath because of your expanding uterus.  You may have more problems sleeping. This can be caused by the size of your belly, increased need to urinate, and an increase in your body's metabolism.  You may notice the fetus "dropping," or moving lower in your abdomen (lightening).  You may have increased vaginal discharge.  You may notice your joints feel loose and you may have pain around your pelvic bone.  What to expect at prenatal visits You will have prenatal exams every 2 weeks until week 36. Then you will have weekly prenatal exams. During a routine prenatal visit:  You will be weighed to make sure you and the baby are growing normally.  Your blood pressure will be taken.  Your abdomen will be measured to track your baby's growth.  The fetal heartbeat will be listened to.  Any test results from the previous visit will be discussed.  You may have a cervical check near your due date to see if your cervix has softened or thinned (effaced).  You will be tested for Group B streptococcus. This happens between 35 and 37 weeks.  Your health care provider may ask you:  What your birth plan is.  How you are feeling.  If you are feeling the baby move.  If you have had   any abnormal symptoms, such as leaking fluid, bleeding, severe headaches, or abdominal cramping.  If you are using any tobacco products, including cigarettes, chewing tobacco, and electronic cigarettes.  If you have any questions.  Other tests or screenings that may be performed during your third trimester include:  Blood tests that check for low iron levels (anemia).  Fetal testing to check the health, activity level, and growth of the fetus. Testing is done if you have certain medical conditions or if there are problems during the  pregnancy.  Nonstress test (NST). This test checks the health of your baby to make sure there are no signs of problems, such as the baby not getting enough oxygen. During this test, a belt is placed around your belly. The baby is made to move, and its heart rate is monitored during movement.  What is false labor? False labor is a condition in which you feel small, irregular tightenings of the muscles in the womb (contractions) that usually go away with rest, changing position, or drinking water. These are called Braxton Hicks contractions. Contractions may last for hours, days, or even weeks before true labor sets in. If contractions come at regular intervals, become more frequent, increase in intensity, or become painful, you should see your health care provider. What are the signs of labor?  Abdominal cramps.  Regular contractions that start at 10 minutes apart and become stronger and more frequent with time.  Contractions that start on the top of the uterus and spread down to the lower abdomen and back.  Increased pelvic pressure and dull back pain.  A watery or bloody mucus discharge that comes from the vagina.  Leaking of amniotic fluid. This is also known as your "water breaking." It could be a slow trickle or a gush. Let your health care provider know if it has a color or strange odor. If you have any of these signs, call your health care provider right away, even if it is before your due date. Follow these instructions at home: Medicines  Follow your health care provider's instructions regarding medicine use. Specific medicines may be either safe or unsafe to take during pregnancy.  Take a prenatal vitamin that contains at least 600 micrograms (mcg) of folic acid.  If you develop constipation, try taking a stool softener if your health care provider approves. Eating and drinking  Eat a balanced diet that includes fresh fruits and vegetables, whole grains, good sources of protein  such as meat, eggs, or tofu, and low-fat dairy. Your health care provider will help you determine the amount of weight gain that is right for you.  Avoid raw meat and uncooked cheese. These carry germs that can cause birth defects in the baby.  If you have low calcium intake from food, talk to your health care provider about whether you should take a daily calcium supplement.  Eat four or five small meals rather than three large meals a day.  Limit foods that are high in fat and processed sugars, such as fried and sweet foods.  To prevent constipation: ? Drink enough fluid to keep your urine clear or pale yellow. ? Eat foods that are high in fiber, such as fresh fruits and vegetables, whole grains, and beans. Activity  Exercise only as directed by your health care provider. Most women can continue their usual exercise routine during pregnancy. Try to exercise for 30 minutes at least 5 days a week. Stop exercising if you experience uterine contractions.  Avoid heavy   lifting.  Do not exercise in extreme heat or humidity, or at high altitudes.  Wear low-heel, comfortable shoes.  Practice good posture.  You may continue to have sex unless your health care provider tells you otherwise. Relieving pain and discomfort  Take frequent breaks and rest with your legs elevated if you have leg cramps or low back pain.  Take warm sitz baths to soothe any pain or discomfort caused by hemorrhoids. Use hemorrhoid cream if your health care provider approves.  Wear a good support bra to prevent discomfort from breast tenderness.  If you develop varicose veins: ? Wear support pantyhose or compression stockings as told by your healthcare provider. ? Elevate your feet for 15 minutes, 3-4 times a day. Prenatal care  Write down your questions. Take them to your prenatal visits.  Keep all your prenatal visits as told by your health care provider. This is important. Safety  Wear your seat belt at  all times when driving.  Make a list of emergency phone numbers, including numbers for family, friends, the hospital, and police and fire departments. General instructions  Avoid cat litter boxes and soil used by cats. These carry germs that can cause birth defects in the baby. If you have a cat, ask someone to clean the litter box for you.  Do not travel far distances unless it is absolutely necessary and only with the approval of your health care provider.  Do not use hot tubs, steam rooms, or saunas.  Do not drink alcohol.  Do not use any products that contain nicotine or tobacco, such as cigarettes and e-cigarettes. If you need help quitting, ask your health care provider.  Do not use any medicinal herbs or unprescribed drugs. These chemicals affect the formation and growth of the baby.  Do not douche or use tampons or scented sanitary pads.  Do not cross your legs for long periods of time.  To prepare for the arrival of your baby: ? Take prenatal classes to understand, practice, and ask questions about labor and delivery. ? Make a trial run to the hospital. ? Visit the hospital and tour the maternity area. ? Arrange for maternity or paternity leave through employers. ? Arrange for family and friends to take care of pets while you are in the hospital. ? Purchase a rear-facing car seat and make sure you know how to install it in your car. ? Pack your hospital bag. ? Prepare the baby's nursery. Make sure to remove all pillows and stuffed animals from the baby's crib to prevent suffocation.  Visit your dentist if you have not gone during your pregnancy. Use a soft toothbrush to brush your teeth and be gentle when you floss. Contact a health care provider if:  You are unsure if you are in labor or if your water has broken.  You become dizzy.  You have mild pelvic cramps, pelvic pressure, or nagging pain in your abdominal area.  You have lower back pain.  You have persistent  nausea, vomiting, or diarrhea.  You have an unusual or bad smelling vaginal discharge.  You have pain when you urinate. Get help right away if:  Your water breaks before 37 weeks.  You have regular contractions less than 5 minutes apart before 37 weeks.  You have a fever.  You are leaking fluid from your vagina.  You have spotting or bleeding from your vagina.  You have severe abdominal pain or cramping.  You have rapid weight loss or weight gain.    You have shortness of breath with chest pain.  You notice sudden or extreme swelling of your face, hands, ankles, feet, or legs.  Your baby makes fewer than 10 movements in 2 hours.  You have severe headaches that do not go away when you take medicine.  You have vision changes. Summary  The third trimester is from week 28 through week 40, months 7 through 9. The third trimester is a time when the unborn baby (fetus) is growing rapidly.  During the third trimester, your discomfort may increase as you and your baby continue to gain weight. You may have abdominal, leg, and back pain, sleeping problems, and an increased need to urinate.  During the third trimester your breasts will keep growing and they will continue to become tender. A yellow fluid (colostrum) may leak from your breasts. This is the first milk you are producing for your baby.  False labor is a condition in which you feel small, irregular tightenings of the muscles in the womb (contractions) that eventually go away. These are called Braxton Hicks contractions. Contractions may last for hours, days, or even weeks before true labor sets in.  Signs of labor can include: abdominal cramps; regular contractions that start at 10 minutes apart and become stronger and more frequent with time; watery or bloody mucus discharge that comes from the vagina; increased pelvic pressure and dull back pain; and leaking of amniotic fluid. This information is not intended to replace advice  given to you by your health care provider. Make sure you discuss any questions you have with your health care provider. Document Released: 02/15/2001 Document Revised: 07/30/2015 Document Reviewed: 04/24/2012 Elsevier Interactive Patient Education  2017 [employer]  

## 2017-09-20 NOTE — Progress Notes (Signed)
96/62 

## 2017-09-20 NOTE — Progress Notes (Signed)

## 2017-09-26 ENCOUNTER — Ambulatory Visit (INDEPENDENT_AMBULATORY_CARE_PROVIDER_SITE_OTHER): Payer: Medicaid Other | Admitting: *Deleted

## 2017-09-26 ENCOUNTER — Encounter: Payer: Self-pay | Admitting: Obstetrics and Gynecology

## 2017-09-26 ENCOUNTER — Ambulatory Visit: Payer: Self-pay

## 2017-09-26 ENCOUNTER — Ambulatory Visit (INDEPENDENT_AMBULATORY_CARE_PROVIDER_SITE_OTHER): Payer: Medicaid Other | Admitting: Obstetrics and Gynecology

## 2017-09-26 VITALS — BP 92/60 | HR 96 | Wt 159.7 lb

## 2017-09-26 DIAGNOSIS — O10919 Unspecified pre-existing hypertension complicating pregnancy, unspecified trimester: Secondary | ICD-10-CM

## 2017-09-26 DIAGNOSIS — O09899 Supervision of other high risk pregnancies, unspecified trimester: Secondary | ICD-10-CM

## 2017-09-26 DIAGNOSIS — O24913 Unspecified diabetes mellitus in pregnancy, third trimester: Secondary | ICD-10-CM

## 2017-09-26 DIAGNOSIS — O10913 Unspecified pre-existing hypertension complicating pregnancy, third trimester: Secondary | ICD-10-CM | POA: Diagnosis not present

## 2017-09-26 DIAGNOSIS — Z98891 History of uterine scar from previous surgery: Secondary | ICD-10-CM

## 2017-09-26 LAB — FETAL NONSTRESS TEST

## 2017-09-26 MED ORDER — POLYETHYLENE GLYCOL 3350 17 GM/SCOOP PO POWD: 17.0000 g | Freq: Every day | ORAL | 0 refills | Status: DC

## 2017-09-26 NOTE — Progress Notes (Signed)
Pt feels a pulling sensation and pain in her Rt lower abdomen. She is concerned that she may have a hernia. Pt requests refill of Miralax.

## 2017-09-26 NOTE — Progress Notes (Signed)

## 2017-09-26 NOTE — Progress Notes (Signed)
Subjective:  [name] is a 32 y.o. [id number] at [redacted]w[redacted]d being seen today for ongoing prenatal care.  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  Patient reports general discomforts of pregnancy.  Contractions: Irregular. Vag. Bleeding: None.  Movement: Present. Denies leaking of fluid.   The following portions of the patient's history were reviewed and updated as appropriate: allergies, current medications, past family history, past medical history, past social history, past surgical history and problem list. Problem list updated.  Objective:   Vitals:   09/26/17 0937  BP: 92/60  Pulse: 96  Weight: 159 lb 11.2 oz (72.4 kg)    Fetal Status: Fetal Heart Rate (bpm): NST   Movement: Present     General:  Alert, oriented and cooperative. Patient is in no acute distress.  Skin: Skin is warm and dry. No rash noted.   Cardiovascular: Normal heart rate noted  Respiratory: Normal respiratory effort, no problems with respiration noted  Abdomen: Soft, gravid, appropriate for gestational age. Pain/Pressure: Present     Pelvic:  Cervical exam deferred        Extremities: Normal range of motion.  Edema: Trace  Mental Status: Normal mood and affect. Normal behavior. Normal judgment and thought content.   Urinalysis:      Assessment and Plan:  Pregnancy: [id number] at [redacted]w[redacted]d  1. Supervision of other high risk pregnancy, antepartum Stable - polyethylene glycol powder (GLYCOLAX/MIRALAX) powder; Take 17 g by mouth daily.  Dispense: 255 g; Refill: 0  2. Diabetes mellitus affecting pregnancy in  third trimester CBG's in goal range Continue with Glyburide BPP 10/10 today Continue with weekly antenatal testing  3. Chronic hypertension during pregnancy, antepartum BP stable No S/Sx of PEC Continue with BASA and antenatal testing  4. History of cesarean delivery For repeat  Preterm labor symptoms and general obstetric precautions including but not limited to vaginal bleeding, contractions, leaking of fluid and fetal movement were reviewed in detail with the patient. Please refer to After Visit Summary for other counseling recommendations.  Return in about 8 days (around 10/04/2017) for as scheduled.   Chancy Milroy, MD

## 2017-09-27 ENCOUNTER — Encounter: Payer: Self-pay | Admitting: *Deleted

## 2017-09-27 ENCOUNTER — Encounter: Payer: Medicaid Other | Admitting: Obstetrics and Gynecology

## 2017-09-29 ENCOUNTER — Ambulatory Visit (HOSPITAL_COMMUNITY): Payer: Medicaid Other

## 2017-09-29 ENCOUNTER — Encounter (HOSPITAL_COMMUNITY): Payer: Self-pay

## 2017-10-04 ENCOUNTER — Ambulatory Visit (INDEPENDENT_AMBULATORY_CARE_PROVIDER_SITE_OTHER): Payer: Medicaid Other | Admitting: *Deleted

## 2017-10-04 ENCOUNTER — Encounter (HOSPITAL_COMMUNITY): Payer: Self-pay

## 2017-10-04 ENCOUNTER — Ambulatory Visit (INDEPENDENT_AMBULATORY_CARE_PROVIDER_SITE_OTHER): Payer: Medicaid Other | Admitting: Obstetrics & Gynecology

## 2017-10-04 ENCOUNTER — Encounter: Payer: Medicaid Other | Admitting: Obstetrics & Gynecology

## 2017-10-04 ENCOUNTER — Ambulatory Visit: Payer: Self-pay

## 2017-10-04 VITALS — BP 105/67 | HR 104 | Wt 159.3 lb

## 2017-10-04 DIAGNOSIS — O24913 Unspecified diabetes mellitus in pregnancy, third trimester: Secondary | ICD-10-CM | POA: Diagnosis not present

## 2017-10-04 DIAGNOSIS — O09893 Supervision of other high risk pregnancies, third trimester: Secondary | ICD-10-CM

## 2017-10-04 DIAGNOSIS — O09899 Supervision of other high risk pregnancies, unspecified trimester: Secondary | ICD-10-CM

## 2017-10-04 DIAGNOSIS — O10919 Unspecified pre-existing hypertension complicating pregnancy, unspecified trimester: Secondary | ICD-10-CM

## 2017-10-04 DIAGNOSIS — Z302 Encounter for sterilization: Secondary | ICD-10-CM

## 2017-10-04 DIAGNOSIS — O1415 Severe pre-eclampsia, complicating the puerperium: Secondary | ICD-10-CM

## 2017-10-04 DIAGNOSIS — Z98891 History of uterine scar from previous surgery: Secondary | ICD-10-CM

## 2017-10-04 DIAGNOSIS — D219 Benign neoplasm of connective and other soft tissue, unspecified: Secondary | ICD-10-CM

## 2017-10-04 DIAGNOSIS — O10913 Unspecified pre-existing hypertension complicating pregnancy, third trimester: Secondary | ICD-10-CM

## 2017-10-04 LAB — POCT URINALYSIS DIP (DEVICE)
Glucose, UA: NEGATIVE mg/dL
Hgb urine dipstick: NEGATIVE
Ketones, ur: 80 mg/dL — AB
Leukocytes, UA: NEGATIVE
Nitrite: NEGATIVE
PH: 6 (ref 5.0–8.0)
PROTEIN: 30 mg/dL — AB
Specific Gravity, Urine: 1.025 (ref 1.005–1.030)
UROBILINOGEN UA: 0.2 mg/dL (ref 0.0–1.0)

## 2017-10-04 NOTE — Progress Notes (Addendum)
Pt informed that the ultrasound is considered a limited OB ultrasound and is not intended to be a complete ultrasound exam.  Patient also informed that the ultrasound is not being completed with the intent of assessing for fetal or placental anomalies or any pelvic abnormalities.  Explained that the purpose of today's ultrasound is to assess for presentation, BPP and amniotic fluid volume.  Patient acknowledges the purpose of the exam and the limitations of the study.    Attestation of Attending Supervision of RN: Evaluation and management procedures were performed by the nurse under my supervision and collaboration.  I have reviewed the nursing note and chart, and I agree with the management and plan.  Carolyn L. Harraway-Smith, M.D., FACOG   

## 2017-10-04 NOTE — Progress Notes (Signed)
   PRENATAL VISIT NOTE  Subjective:  [name] is a 32 y.o. [id number] at [redacted]w[redacted]d being seen today for ongoing prenatal care.  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  Patient reports her glucose has been high at home.  .  Contractions: Irregular. Vag. Bleeding: None.  Movement: Present. Denies leaking of fluid.   The following portions of the patient's history were reviewed and updated as appropriate: allergies, current medications, past family history, past medical history, past social history, past surgical history and problem list. Problem list updated.  Objective:   Vitals:   10/04/17 1020  BP: 105/67  Pulse: (!) 104  Weight: 159 lb 4.8 oz (72.3 kg)    Fetal Status: Fetal Heart Rate (bpm): NST   Movement: Present     General:  Alert, oriented and cooperative. Patient is in no acute distress.  Skin: Skin is warm and dry. No rash noted.   Cardiovascular: Normal heart rate noted  Respiratory: Normal respiratory effort, no problems with respiration noted  Abdomen: Soft, gravid, appropriate for gestational age.  Pain/Pressure: Present     Pelvic: Cervical exam deferred        Extremities: Normal range of motion.  Edema: None  Mental Status: Normal mood and affect. Normal behavior. Normal judgment and thought content.   Assessment and Plan:  Pregnancy: [id number] at [redacted]w[redacted]d  1. Supervision of other high risk pregnancy, antepartum  2. Diabetes mellitus affecting pregnancy in third trimester Pt did not bring her [place] log. She reports that her [place] was as high as 187. She does  not want to increase her meds as she reports a h/o hypoglycemia. She agreed to log and bring in her results in 1 weeks. She agrees to allow a change in meds if her dietary changes have not lowered her glc in that time frame.      3. Chronic hypertension during pregnancy, antepartum BP WNL off meds  4. History of cesarean delivery For scheduled repeat at 39 weeks  Sent to posting for scheduling    5. Request for sterilization [facility] papers signed 07/20/2017  6. History of postpartum severe pre-eclampsia On ASA daily    7. Fibroid  Preterm labor symptoms and general obstetric precautions including but not limited to vaginal bleeding, contractions, leaking of fluid and fetal movement were reviewed in detail with the patient. Please refer to After Visit Summary for other counseling recommendations.  Return in about 2 weeks (around 10/18/2017) for weekly HOB and NST/BPP.  Future Appointments  Date Time Provider Department Center  10/04/2017 11:15 AM Lavonia Drafts, MD [facility]    Lavonia Drafts, MD

## 2017-10-04 NOTE — Patient Instructions (Signed)
What You Need to Know About Female Sterilization Female sterilization is surgery to prevent pregnancy. In this surgery, the fallopian tubes are either blocked or closed off. This prevents eggs from reaching the uterus so that the eggs cannot be fertilized by sperm and you cannot get pregnant. Sterilization is permanent. It should only be done if you are sure that you do not want to be able to have children. What are the sterilization surgery options? There are several kinds of female sterilization surgeries. They include:  Laparoscopic tubal ligation. In this surgery, the fallopian tubes are tied off, sealed with heat, or blocked with a clip, ring, or clamp. A small portion of each fallopian tube may also be removed. This surgery is done through several small cuts (incisions).  Postpartum tubal ligation. This is also called a mini-laparotomy. This surgery is done right after childbirth or 1 or 2 days after childbirth. In this surgery, the fallopian tubes are tied off, sealed with heat, or blocked with a clip, ring, or clamp. A small portion of each fallopian tube may also be removed. The surgery is done through a single incision.  Hysteroscopic sterilization. In this surgery, a tiny, spring-like coil is inserted through the cervix and uterus into the fallopian tubes. The coil causes scarring, which blocks the tubes. After the surgery, contraception should be used for 3 months to allow the scar tissue to form completely.  Is sterilization safe? Generally, sterilization is safe. Complications are rare. However, there are risks. They include:  Bleeding.  Infection.  Reaction to medicine used during the procedure.  Injury to surrounding organs.  Failure of the procedure.  How effective is sterilization? Sterilization is nearly 100% effective, but it can fail. Also, the fallopian tubes can grow back together over time. If this happens, you will be able to get pregnant again. Women who have had  this procedure have a higher chance of having an ectopic pregnancy. An ectopic pregnancy is a pregnancy that happens outside of the uterus. This kind of pregnancy is unsuccessful and can lead to serious bleeding if it is not treated. What are the benefits?  It is usually effective for a lifetime.  It is usually safe.  It does not have the drawbacks of other types of birth control: That means: ? Your hormones are not affected. Because of this, your menstrual periods, sexual desire, and sexual performance will not be affected. ? There are no side effects. What are the drawbacks?  If you change your mind and decide that you want to have children, you may not be able to. Sterilization may be reversed, but a reversal is not always successful.  It does not provide protection against STDs (sexually transmitted diseases).  It increases the chance of having an ectopic pregnancy. This information is not intended to replace advice given to you by your health care provider. Make sure you discuss any questions you have with your health care provider. Document Released: 08/10/2007 Document Revised: 10/15/2015 Document Reviewed: 11/18/2014 Elsevier Interactive Patient Education  2018 [place] BREASTFEEDING Many women wonder if they should breastfeed. Research shows that breast milk contains the perfect balance of vitamins, protein and fat that your baby needs to grow. It also contains antibodies that help your baby's immune system to fight off viruses and bacteria and can reduce the risk of sudden infant death syndrome (SIDS). In addition, the colostrum (a fluid secreted from the breast in the first few days after delivery) helps your newborn's  digestive system to grow and function well. Breast milk is easier to digest than formula. Also, if your baby is born preterm, breast milk can help to reduce both short- and long-term health problems. BENEFITS OF BREASTFEEDING FOR  MOM . Breastfeeding causes a hormone to be released that helps the uterus to contract and return to its normal size more quickly. . It aids in postpartum weight loss, reduces risk of breast and ovarian cancer, heart disease and rheumatoid arthritis. . It decreases the amount of bleeding after the baby is born. benefits of breastfeeding for baby . Provides comfort and nutrition . Protects baby against - Obesity - Diabetes - Asthma - Childhood cancers - Heart disease - Ear infections - Diarrhea - Pneumonia - Stomach problems - Serious allergies - Skin rashes . Promotes growth and development . Reduces the risk of baby having Sudden Infant Death Syndrome (SIDS) only breastmilk for the first 6 months . Protects baby against diseases/allergies . It's the perfect amount for tiny bellies . It restores baby's energy . Provides the best nutrition for baby . Giving water or formula can make baby more likely to get sick, decrease Mom's milk supply, make baby less content with breastfeeding Skin to Skin After delivery, the staff will place your baby on your chest. This helps with the following: . Regulates baby's temperature, breathing, heart rate and blood sugar . Increases Mom's milk supply . Promotes bonding . Keeps baby and Mom calm and decreases baby's crying Rooming In Your baby will stay in your room with you for the entire time you are in the hospital. This helps with the following: . Allows Mom to learn baby's feeding cues - Fluttering eyes - Sucking on tongue or hand - Rooting (opens mouth and turns head) - Nuzzling into the breast - Bringing hand to mouth . Allows breastfeeding on demand (when your baby is ready) . Helps baby to be calm and content . Ensures a good milk supply . Prevents complications with breastfeeding . Allows parents to learn to care for baby . Allows you to request assistance with breastfeeding Importance of a good latch . Increases milk transfer to  baby - baby gets enough milk . Ensures you have enough milk for your baby . Decreases nipple soreness . Don't use pacifiers and bottles - these cause baby to suck differently than breastfeeding . Promotes continuation of breastfeeding Risks of Formula Supplementation with Breastfeeding Giving your infant formula in addition to your breast-milk EXCEPT when medically necessary can lead to: Marland Kitchen Decreases your milk supply  . Loss of confidence in yourself for providing baby's nutrition  . Engorgement and possibly mastitis  . Asthma & allergies in the baby BREASTFEEDING FAQS How long should I breastfeed my baby? It is recommended that you provide your baby with breast milk only for the first 6 months and then continue for the first year and longer as desired. During the first few weeks after birth, your baby will need to feed 8-12 times every 24 hours, or every 2-3 hours. They will likely feed for 15-30 minutes. How can I help my baby begin breastfeeding? Babies are born with an instinct to breastfeed. A healthy baby can begin breastfeeding right away without specific help. At the hospital, a nurse (or lactation consultant) will help you begin the process and will give you tips on good positioning. It may be helpful to take a breastfeeding class before you deliver in order to know what to expect. How can I help my  baby latch on? In order to assist your baby in latching-on, cup your breast in your hand and stroke your baby's lower lip with your nipple to stimulate your baby's rooting reflex. Your baby will look like he or she is yawning, at which point you should bring the baby towards your breast, while aiming the nipple at the roof of his or her mouth. Remember to bring the baby towards you and not your breast towards the baby. How can I tell if my baby is latched-on? Your baby will have all of your nipple and part of the dark area around the nipple in his or her mouth and your baby's nose will be  touching your breast. You should see or hear the baby swallowing. If the baby is not latched-on properly, start the process over. To remove the suction, insert a clean finger between your breast and the baby's mouth. Should I switch breasts during feeding? After feeding on one side, switch the baby to your other breast. If he or she does not continue feeding - that is OK. Your baby will not necessarily need to feed from both breasts in a single feeding. On the next feeding, start with the other breast for efficiency and comfort. How can I tell if my baby is hungry? When your baby is hungry, they will nuzzle against your breast, make sucking noises and tongue motions and may put their hands near their mouth. Crying is a late sign of hunger, so you should not wait until this point. When they have received enough milk, they will unlatch from the breast. Is it okay to use a pacifier? Until your baby gets the hang of breastfeeding, experts recommend limiting pacifier usage. If you have questions about this, please contact your pediatrician. What can I do to ensure proper nutrition while breastfeeding? . Make sure that you support your own health and your baby's by eating a healthy, well-balanced diet . Your provider may recommend that you continue to take your prenatal vitamin . Drink plenty of fluids. It is a good rule to drink one glass of water before or after feeding . Alcohol will remain in the breast milk for as long as it will remain in the blood stream. If you choose to have a drink, it is recommended that you wait at least 2 hours before feeding . Moderate amounts of caffeine are OK . Some over-the-counter or prescription medications are not recommended during breastfeeding. Check with your provider if you have questions What types of birth control methods are safe while breastfeeding? Progestin-only methods, including a daily pill, an IUD, the implant and the injection are safe while  breastfeeding. Methods that contain estrogen (such as combination birth control pills, the vaginal ring and the patch) should not be used during the first month of breastfeeding as these can decrease your milk supply.

## 2017-10-04 NOTE — Progress Notes (Signed)
Pt reports increased episodes of nausea and pain @ previous C/S incision site.  She has also had some elevated blood sugars. Pt expressed concerns about abnormal urinalysis today and previously on 7/17.

## 2017-10-09 ENCOUNTER — Telehealth: Payer: Self-pay | Admitting: Family Medicine

## 2017-10-09 ENCOUNTER — Encounter: Payer: Medicaid Other | Admitting: Obstetrics and Gynecology

## 2017-10-09 ENCOUNTER — Other Ambulatory Visit: Payer: Medicaid Other

## 2017-10-09 NOTE — Telephone Encounter (Signed)
Patient called to say she was not able to come in this afternoon for her appointment. She stated she would not be able to come, but would keep her appointment on the 15th of August. She will call if her sugars change.

## 2017-10-12 ENCOUNTER — Encounter: Payer: Medicaid Other | Admitting: Obstetrics & Gynecology

## 2017-10-12 ENCOUNTER — Other Ambulatory Visit: Payer: Medicaid Other

## 2017-10-19 ENCOUNTER — Ambulatory Visit: Payer: Self-pay

## 2017-10-19 ENCOUNTER — Other Ambulatory Visit (HOSPITAL_COMMUNITY)
Admission: RE | Admit: 2017-10-19 | Discharge: 2017-10-19 | Disposition: A | Discharge status: Home / Self Care | Payer: Medicaid Other | Source: Ambulatory Visit | Attending: Obstetrics and Gynecology | Admitting: Obstetrics and Gynecology

## 2017-10-19 ENCOUNTER — Ambulatory Visit (INDEPENDENT_AMBULATORY_CARE_PROVIDER_SITE_OTHER): Payer: Medicaid Other | Admitting: Obstetrics and Gynecology

## 2017-10-19 ENCOUNTER — Ambulatory Visit (INDEPENDENT_AMBULATORY_CARE_PROVIDER_SITE_OTHER): Payer: Medicaid Other | Admitting: *Deleted

## 2017-10-19 VITALS — BP 90/55 | HR 96 | Wt 157.0 lb

## 2017-10-19 DIAGNOSIS — O09893 Supervision of other high risk pregnancies, third trimester: Secondary | ICD-10-CM

## 2017-10-19 DIAGNOSIS — O10919 Unspecified pre-existing hypertension complicating pregnancy, unspecified trimester: Secondary | ICD-10-CM

## 2017-10-19 DIAGNOSIS — O24913 Unspecified diabetes mellitus in pregnancy, third trimester: Secondary | ICD-10-CM

## 2017-10-19 DIAGNOSIS — O09899 Supervision of other high risk pregnancies, unspecified trimester: Secondary | ICD-10-CM

## 2017-10-19 DIAGNOSIS — Z3A37 37 weeks gestation of pregnancy: Secondary | ICD-10-CM | POA: Insufficient documentation

## 2017-10-19 DIAGNOSIS — O10913 Unspecified pre-existing hypertension complicating pregnancy, third trimester: Secondary | ICD-10-CM

## 2017-10-19 DIAGNOSIS — Z98891 History of uterine scar from previous surgery: Secondary | ICD-10-CM

## 2017-10-19 LAB — POCT URINALYSIS DIP (DEVICE)
Glucose, UA: 250 mg/dL — AB
HGB URINE DIPSTICK: NEGATIVE
Ketones, ur: 40 mg/dL — AB
Leukocytes, UA: NEGATIVE
Nitrite: NEGATIVE
PH: 6 (ref 5.0–8.0)
Protein, ur: NEGATIVE mg/dL
Urobilinogen, UA: 0.2 mg/dL (ref 0.0–1.0)

## 2017-10-19 NOTE — Progress Notes (Signed)
Pt states she has had a mild flare up of pancreatitis. Pt also reports increased amount of UC's- desires Cx exam today.

## 2017-10-19 NOTE — Progress Notes (Signed)

## 2017-10-20 ENCOUNTER — Encounter: Payer: Self-pay | Admitting: Obstetrics and Gynecology

## 2017-10-20 ENCOUNTER — Encounter (HOSPITAL_COMMUNITY): Payer: Self-pay

## 2017-10-20 ENCOUNTER — Telehealth: Payer: Self-pay | Admitting: *Deleted

## 2017-10-20 LAB — GC/CHLAMYDIA PROBE AMP (~~LOC~~) NOT AT ARMC
Chlamydia: NEGATIVE
Neisseria Gonorrhea: NEGATIVE

## 2017-10-20 NOTE — Telephone Encounter (Signed)
Pt notified that Dr. Ilda Basset wants her to have [place] next week to check the growth of baby. This has been scheduled on Tuesday 8/20 @ 1115.  Pt agreed to plan and voiced understanding.

## 2017-10-20 NOTE — Addendum Note (Signed)
Addended by: Aletha Halim on: 10/20/2017 03:38 PM   Modules accepted: Orders

## 2017-10-20 NOTE — Progress Notes (Addendum)
Prenatal Visit Note Date: 10/19/2017 Clinic: [facility]  Subjective:  [name] is a 32 y.o. [id number] at [redacted]w[redacted]d being seen today for ongoing prenatal care.  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  Patient reports see RN note.   Contractions: Irregular. Vag. Bleeding: None.  Movement: Present. Denies leaking of fluid.   The following portions of the patient's history were reviewed and updated as appropriate: allergies, current medications, past family history, past medical history, past social history, past surgical history and problem list. Problem list updated.  Objective:   Vitals:   10/19/17 1043  BP: (!) 90/55  Pulse: 96  Weight: 157 lb (71.2 kg)    Fetal Status: Fetal Heart Rate (bpm): NST   Movement: Present     General:  Alert, oriented and cooperative. Patient is in no acute distress.  Skin: Skin is warm and dry. No rash noted.   Cardiovascular: Normal heart rate noted  Respiratory: Normal respiratory effort, no problems with respiration noted  Abdomen: Soft, gravid, appropriate for gestational age. Pain/Pressure: Present     Pelvic:  Cervical exam performed Dilation: Closed Effacement (%): 50 Station: Ballotable  Extremities: Normal range of motion.  Edema: None  Mental Status: Normal mood and affect. Normal behavior. Normal judgment and thought content.   Urinalysis:      Assessment and Plan:  Pregnancy: [id number] at [redacted]w[redacted]d  1. Supervision of other high risk pregnancy, antepartum Routine care. BTL papers UTD - GC/Chlamydia probe amp  ([place])not at [facility] - Strep Gp B NAA  2. Chronic hypertension during pregnancy, antepartum Doing well on no meds. Continue low dose asa  3. Diabetes mellitus affecting pregnancy in third trimester Normal BS log on 1.25 with breakfast and 1.25 with dinner. 2h PP are slightly up for lunch so will do 2.5 with breakfast. bpp 10/10 today. I dont see a repeat growth, 7/12 was normal. Will order one.   4. History of cesarean delivery Already scheduled for rpt  Preterm labor symptoms and general obstetric precautions including but not limited to vaginal bleeding, contractions, leaking of fluid and fetal movement were reviewed in detail with the patient. Please refer to After Visit Summary for other counseling recommendations.  Return for already scheduled.   Aletha Halim, MD

## 2017-10-21 LAB — STREP GP B NAA: STREP GROUP B AG: POSITIVE — AB

## 2017-10-23 ENCOUNTER — Encounter: Payer: Self-pay | Admitting: Obstetrics and Gynecology

## 2017-10-23 DIAGNOSIS — O99820 Streptococcus B carrier state complicating pregnancy: Secondary | ICD-10-CM | POA: Insufficient documentation

## 2017-10-24 ENCOUNTER — Ambulatory Visit (HOSPITAL_COMMUNITY)
Admission: RE | Admit: 2017-10-24 | Discharge: 2017-10-24 | Disposition: A | Discharge status: Home / Self Care | Payer: Medicaid Other | Source: Ambulatory Visit | Attending: Obstetrics and Gynecology | Admitting: Obstetrics and Gynecology

## 2017-10-24 ENCOUNTER — Encounter (HOSPITAL_COMMUNITY): Payer: Self-pay

## 2017-10-24 ENCOUNTER — Other Ambulatory Visit: Payer: Self-pay | Admitting: Obstetrics and Gynecology

## 2017-10-24 DIAGNOSIS — O09293 Supervision of pregnancy with other poor reproductive or obstetric history, third trimester: Secondary | ICD-10-CM | POA: Insufficient documentation

## 2017-10-24 DIAGNOSIS — Z3A37 37 weeks gestation of pregnancy: Secondary | ICD-10-CM | POA: Insufficient documentation

## 2017-10-24 DIAGNOSIS — O34219 Maternal care for unspecified type scar from previous cesarean delivery: Secondary | ICD-10-CM | POA: Diagnosis not present

## 2017-10-24 DIAGNOSIS — O24113 Pre-existing diabetes mellitus, type 2, in pregnancy, third trimester: Secondary | ICD-10-CM | POA: Insufficient documentation

## 2017-10-24 DIAGNOSIS — O10013 Pre-existing essential hypertension complicating pregnancy, third trimester: Secondary | ICD-10-CM | POA: Diagnosis not present

## 2017-10-24 DIAGNOSIS — O09899 Supervision of other high risk pregnancies, unspecified trimester: Secondary | ICD-10-CM

## 2017-10-24 DIAGNOSIS — Z7984 Long term (current) use of oral hypoglycemic drugs: Secondary | ICD-10-CM | POA: Insufficient documentation

## 2017-10-24 DIAGNOSIS — O10919 Unspecified pre-existing hypertension complicating pregnancy, unspecified trimester: Secondary | ICD-10-CM

## 2017-10-24 DIAGNOSIS — D573 Sickle-cell trait: Secondary | ICD-10-CM | POA: Insufficient documentation

## 2017-10-24 DIAGNOSIS — Z7982 Long term (current) use of aspirin: Secondary | ICD-10-CM | POA: Diagnosis not present

## 2017-10-24 DIAGNOSIS — O10913 Unspecified pre-existing hypertension complicating pregnancy, third trimester: Secondary | ICD-10-CM | POA: Diagnosis not present

## 2017-10-24 DIAGNOSIS — O24913 Unspecified diabetes mellitus in pregnancy, third trimester: Secondary | ICD-10-CM

## 2017-10-24 DIAGNOSIS — D259 Leiomyoma of uterus, unspecified: Secondary | ICD-10-CM | POA: Diagnosis not present

## 2017-10-24 DIAGNOSIS — O99013 Anemia complicating pregnancy, third trimester: Secondary | ICD-10-CM | POA: Insufficient documentation

## 2017-10-24 DIAGNOSIS — O3413 Maternal care for benign tumor of corpus uteri, third trimester: Secondary | ICD-10-CM | POA: Diagnosis not present

## 2017-10-25 ENCOUNTER — Ambulatory Visit (INDEPENDENT_AMBULATORY_CARE_PROVIDER_SITE_OTHER): Payer: Medicaid Other | Admitting: *Deleted

## 2017-10-25 ENCOUNTER — Ambulatory Visit: Payer: Self-pay

## 2017-10-25 ENCOUNTER — Encounter: Payer: Self-pay | Admitting: Obstetrics and Gynecology

## 2017-10-25 ENCOUNTER — Ambulatory Visit (INDEPENDENT_AMBULATORY_CARE_PROVIDER_SITE_OTHER): Payer: Medicaid Other | Admitting: Obstetrics and Gynecology

## 2017-10-25 VITALS — BP 99/67 | HR 104 | Wt 157.1 lb

## 2017-10-25 DIAGNOSIS — O09899 Supervision of other high risk pregnancies, unspecified trimester: Secondary | ICD-10-CM

## 2017-10-25 DIAGNOSIS — O10919 Unspecified pre-existing hypertension complicating pregnancy, unspecified trimester: Secondary | ICD-10-CM

## 2017-10-25 DIAGNOSIS — Z98891 History of uterine scar from previous surgery: Secondary | ICD-10-CM

## 2017-10-25 DIAGNOSIS — Z302 Encounter for sterilization: Secondary | ICD-10-CM

## 2017-10-25 DIAGNOSIS — D219 Benign neoplasm of connective and other soft tissue, unspecified: Secondary | ICD-10-CM

## 2017-10-25 DIAGNOSIS — O24913 Unspecified diabetes mellitus in pregnancy, third trimester: Secondary | ICD-10-CM

## 2017-10-25 DIAGNOSIS — K8500 Idiopathic acute pancreatitis without necrosis or infection: Secondary | ICD-10-CM

## 2017-10-25 DIAGNOSIS — O1415 Severe pre-eclampsia, complicating the puerperium: Secondary | ICD-10-CM

## 2017-10-25 DIAGNOSIS — D573 Sickle-cell trait: Secondary | ICD-10-CM

## 2017-10-25 LAB — POCT URINALYSIS DIP (DEVICE)
Glucose, UA: NEGATIVE mg/dL
HGB URINE DIPSTICK: NEGATIVE
Ketones, ur: >=160 mg/dL — AB
LEUKOCYTES UA: NEGATIVE
Nitrite: NEGATIVE
PH: 6 (ref 5.0–8.0)
Protein, ur: 30 mg/dL — AB
Urobilinogen, UA: 0.2 mg/dL (ref 0.0–1.0)

## 2017-10-25 NOTE — Progress Notes (Signed)
   PRENATAL VISIT NOTE  Subjective:  [name] is a 32 y.o. [id number] at [redacted]w[redacted]d being seen today for ongoing prenatal care.  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  Patient reports intermittently regular contractions, sometimes strong and infrequent.  Contractions: Irregular. Vag. Bleeding: None.  Movement: Present. Denies leaking of fluid.   The following portions of the patient's history were reviewed and updated as appropriate: allergies, current medications, past family history, past medical history, past social history, past surgical history and problem list. Problem list updated.  Objective:   Vitals:   10/25/17 0918  BP: 99/67  Pulse: (!) 104  Weight: 157 lb 1.6 oz (71.3 kg)    Fetal Status: Fetal Heart Rate (bpm): NST   Movement: Present     General:  Alert, oriented and cooperative. Patient is in no acute distress.  Skin: Skin is warm and dry. No rash noted.   Cardiovascular: Normal heart rate noted  Respiratory: Normal respiratory effort, no problems with respiration noted  Abdomen: Soft, gravid, appropriate for gestational age.  Pain/Pressure: Present     Pelvic: Cervical exam deferred        Extremities: Normal range of motion.     Mental Status: Normal mood and affect. Normal behavior. Normal judgment and thought content.   Assessment and Plan:  Pregnancy: [id number] at [redacted]w[redacted]d  1. Supervision of other high risk pregnancy, antepartum  2. History of cesarean delivery RCS scheduled for  9/1  3. Chronic hypertension during pregnancy, antepartum Stable, no meds Cont baby ASA  4. 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 Request for sterilization For BTL  6. Idiopathic acute pancreatitis without infection or necrosis Occasional flare up, has been able to tolerate  7. Fibroid  8. History of postpartum severe pre-eclampsia  9. Diabetes mellitus affecting pregnancy in third trimester On glyburide 2.5 with breakfast, 1.25 with dinner FG: all < 95 PP: < 125 BPP 10/10 Reactive NST   Term labor symptoms and general obstetric precautions including but not limited to vaginal bleeding, contractions, leaking of fluid and fetal movement were reviewed in detail with the patient. Please refer to After Visit Summary for other counseling recommendations.  Return in about 6 days (around 10/31/2017) for as scheduled.  Future Appointments  Date Time Provider [place]  10/25/2017 10:15 AM Sloan Leiter, MD [facility] [place]  10/31/2017  8:15 AM [facility] NST [facility]  10/31/2017  9:15 AM Lavonia Drafts, MD [facility]  11/03/2017  9:45 AM [facility] PAT 5 [facility] None    Sloan Leiter, MD

## 2017-10-25 NOTE — Progress Notes (Signed)
Py had [place] for growth yesterday.  Rpt C.S scheduled on 9/1.

## 2017-10-25 NOTE — Progress Notes (Signed)

## 2017-10-31 ENCOUNTER — Ambulatory Visit (INDEPENDENT_AMBULATORY_CARE_PROVIDER_SITE_OTHER): Payer: Medicaid Other | Admitting: Obstetrics & Gynecology

## 2017-10-31 ENCOUNTER — Ambulatory Visit (INDEPENDENT_AMBULATORY_CARE_PROVIDER_SITE_OTHER): Payer: Medicaid Other | Admitting: *Deleted

## 2017-10-31 ENCOUNTER — Ambulatory Visit: Payer: Self-pay

## 2017-10-31 VITALS — BP 107/66 | HR 88 | Wt 155.4 lb

## 2017-10-31 DIAGNOSIS — D573 Sickle-cell trait: Secondary | ICD-10-CM

## 2017-10-31 DIAGNOSIS — K863 Pseudocyst of pancreas: Secondary | ICD-10-CM

## 2017-10-31 DIAGNOSIS — O10919 Unspecified pre-existing hypertension complicating pregnancy, unspecified trimester: Secondary | ICD-10-CM

## 2017-10-31 DIAGNOSIS — O10913 Unspecified pre-existing hypertension complicating pregnancy, third trimester: Secondary | ICD-10-CM

## 2017-10-31 DIAGNOSIS — Z98891 History of uterine scar from previous surgery: Secondary | ICD-10-CM

## 2017-10-31 DIAGNOSIS — O1415 Severe pre-eclampsia, complicating the puerperium: Secondary | ICD-10-CM

## 2017-10-31 DIAGNOSIS — Z302 Encounter for sterilization: Secondary | ICD-10-CM

## 2017-10-31 DIAGNOSIS — O09899 Supervision of other high risk pregnancies, unspecified trimester: Secondary | ICD-10-CM

## 2017-10-31 DIAGNOSIS — O99820 Streptococcus B carrier state complicating pregnancy: Secondary | ICD-10-CM

## 2017-10-31 DIAGNOSIS — O24913 Unspecified diabetes mellitus in pregnancy, third trimester: Secondary | ICD-10-CM

## 2017-10-31 NOTE — Progress Notes (Signed)
   PRENATAL VISIT NOTE  Subjective:  [name] is a 32 y.o. [id number] at [redacted]w[redacted]d being seen today for ongoing prenatal care.  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  Patient reports no complaints.  Contractions: Irregular. Vag. Bleeding: None.  Movement: Present. Denies leaking of fluid.   The following portions of the patient's history were reviewed and updated as appropriate: allergies, current medications, past family history, past medical history, past social history, past surgical history and problem list. Problem list updated.  Objective:   Vitals:   10/31/17 0911  Weight: 155 lb 6.4 oz (70.5 kg)    Fetal Status: Fetal Heart Rate (bpm): NST   Movement: Present  Presentation: Vertex  General:  Alert, oriented and cooperative. Patient is in no acute distress.  Skin: Skin is warm and dry. No rash noted.   Cardiovascular: Normal heart rate noted  Respiratory: Normal respiratory effort, no problems with respiration noted  Abdomen: Soft, gravid, appropriate for gestational age.  Pain/Pressure: Present     Pelvic: Cervical exam deferred        Extremities: Normal range of motion.     Mental Status: Normal mood and affect. Normal behavior. Normal judgment and thought content.   Assessment and Plan:  Pregnancy: [id number] at [redacted]w[redacted]d  1. Supervision of other high risk pregnancy, antepartum  2. Chronic hypertension during pregnancy, antepartum Stopped baby ASA  3. History of cesarean delivery For repeat  with BTL on 11/05/2017 Pt has a h/o uterine fibroids.  I have reviewed   The risks of cesarean section discussed with the patient included but were not limited to: bleeding which may require transfusion or reoperation; infection which may require antibiotics; injury to bowel, bladder, ureters or other surrounding organs; injury to the fetus; need for additional procedures including hysterectomy in the event of a life-threatening hemorrhage; placental abnormalities wth subsequent pregnancies, incisional problems, thromboembolic phenomenon and other postoperative/anesthesia complications.   All questions answered.   4. Request for sterilization  5. History of postpartum severe pre-eclampsia  6. Hemoglobin S trait ([facility])  7. GBS (group B Streptococcus carrier), +RV culture, currently pregnant  8. Diabetes mellitus affecting pregnancy in third trimester Pt did not have log with her. She reports 1 elevated fasting after eating Sonic late and another elevated PP.     NST reviewed and reactive.  Term labor symptoms and general obstetric precautions including but not limited to vaginal bleeding, contractions, leaking of fluid and fetal movement were reviewed in detail with the patient. Please refer to After Visit Summary for other counseling recommendations.  Return in about 3 weeks (around 11/21/2017) for Wound check,  C/S on 9/1.  Future Appointments  Date Time Provider [place]  11/03/2017  9:45 AM [facility] PAT 5 [facility] None    Lavonia Drafts, MD

## 2017-10-31 NOTE — Progress Notes (Addendum)
Pt informed that the ultrasound is considered a limited OB ultrasound and is not intended to be a complete ultrasound exam.  Patient also informed that the ultrasound is not being completed with the intent of assessing for fetal or placental anomalies or any pelvic abnormalities.  Explained that the purpose of today's ultrasound is to assess for presentation, BPP and amniotic fluid volume.  Patient acknowledges the purpose of the exam and the limitations of the study.    Attestation of Attending Supervision of RN: Evaluation and management procedures were performed by the nurse under my supervision and collaboration.  I have reviewed the nursing note and chart, and I agree with the management and plan.  Carolyn L. Harraway-Smith, M.D., FACOG   

## 2017-10-31 NOTE — Patient Instructions (Addendum)

## 2017-10-31 NOTE — Progress Notes (Signed)
Repeat C/S & BTS scheduled on 9/1.

## 2017-11-03 ENCOUNTER — Encounter (HOSPITAL_COMMUNITY)
Admission: RE | Admit: 2017-11-03 | Discharge: 2017-11-03 | Disposition: A | Discharge status: Home / Self Care | Payer: Medicaid Other | Source: Ambulatory Visit | Attending: Obstetrics & Gynecology | Admitting: Obstetrics & Gynecology

## 2017-11-03 HISTORY — DX: Benign neoplasm of connective and other soft tissue, unspecified: D21.9

## 2017-11-03 HISTORY — DX: Sickle-cell trait: D57.3

## 2017-11-03 LAB — CBC
HCT: 31.2 % — ABNORMAL LOW (ref 36.0–46.0)
HEMOGLOBIN: 10.5 g/dL — AB (ref 12.0–15.0)
MCH: 23.2 pg — ABNORMAL LOW (ref 26.0–34.0)
MCHC: 33.7 g/dL (ref 30.0–36.0)
MCV: 69 fL — AB (ref 78.0–100.0)
Platelets: 425 10*3/uL — ABNORMAL HIGH (ref 150–400)
RBC: 4.52 MIL/uL (ref 3.87–5.11)
RDW: 13.9 % (ref 11.5–15.5)
WBC: 8.3 10*3/uL (ref 4.0–10.5)

## 2017-11-03 LAB — TYPE AND SCREEN
ABO/RH(D): B POS
Antibody Screen: NEGATIVE

## 2017-11-03 NOTE — Patient Instructions (Signed)
Marina Y Mcdonagh  11/03/2017   Your procedure is scheduled on:  11/05/2017  Enter through the Main Entrance of [facility] [place] up the phone at the desk and dial [phone]  Call this number if you have problems the morning of surgery:[phone]  Remember:   Do not eat food:(After Midnight) Desps de medianoche.  Do not drink clear liquids: (After Midnight) Desps de medianoche.  Take these medicines the morning of surgery with A SIP OF WATER: do not take glyburide morning of surgery. Do not take any meds.   Do not wear jewelry, make-up or nail polish.  Do not wear lotions, powders, or perfumes. Do not wear deodorant.  Do not shave 48 hours prior to surgery.  Do not bring valuables to the hospital.  [facility] is not   responsible for any belongings or valuables brought to the hospital.  Contacts, dentures or bridgework may not be worn into surgery.  Leave suitcase in the car. After surgery it may be brought to your room.  For patients admitted to the hospital, checkout time is 11:00 AM the day of              discharge.    N/A   Please read over the following fact sheets that you were given:   Surgical Site Infection Prevention

## 2017-11-04 LAB — RPR: RPR: NONREACTIVE

## 2017-11-05 ENCOUNTER — Other Ambulatory Visit: Payer: Self-pay

## 2017-11-05 ENCOUNTER — Inpatient Hospital Stay (HOSPITAL_COMMUNITY)
Admission: RE | Admit: 2017-11-05 | Discharge: 2017-11-08 | DRG: 783 | Disposition: A | Discharge status: Home / Self Care | Payer: Medicaid Other | Attending: Obstetrics & Gynecology | Admitting: Obstetrics & Gynecology

## 2017-11-05 ENCOUNTER — Encounter (HOSPITAL_COMMUNITY)
Admission: RE | Disposition: A | Discharge status: Home / Self Care | Payer: Self-pay | Source: Home / Self Care | Attending: Obstetrics & Gynecology

## 2017-11-05 ENCOUNTER — Encounter (HOSPITAL_COMMUNITY): Payer: Self-pay | Admitting: *Deleted

## 2017-11-05 ENCOUNTER — Inpatient Hospital Stay (HOSPITAL_COMMUNITY): Payer: Medicaid Other | Admitting: Anesthesiology

## 2017-11-05 DIAGNOSIS — E119 Type 2 diabetes mellitus without complications: Secondary | ICD-10-CM | POA: Diagnosis present

## 2017-11-05 DIAGNOSIS — O09899 Supervision of other high risk pregnancies, unspecified trimester: Secondary | ICD-10-CM

## 2017-11-05 DIAGNOSIS — Z302 Encounter for sterilization: Secondary | ICD-10-CM | POA: Diagnosis not present

## 2017-11-05 DIAGNOSIS — O34211 Maternal care for low transverse scar from previous cesarean delivery: Secondary | ICD-10-CM | POA: Diagnosis not present

## 2017-11-05 DIAGNOSIS — O99334 Smoking (tobacco) complicating childbirth: Secondary | ICD-10-CM | POA: Diagnosis present

## 2017-11-05 DIAGNOSIS — O9902 Anemia complicating childbirth: Secondary | ICD-10-CM | POA: Diagnosis present

## 2017-11-05 DIAGNOSIS — O34219 Maternal care for unspecified type scar from previous cesarean delivery: Secondary | ICD-10-CM | POA: Diagnosis not present

## 2017-11-05 DIAGNOSIS — O99824 Streptococcus B carrier state complicating childbirth: Secondary | ICD-10-CM | POA: Diagnosis present

## 2017-11-05 DIAGNOSIS — Z3A39 39 weeks gestation of pregnancy: Secondary | ICD-10-CM | POA: Diagnosis not present

## 2017-11-05 DIAGNOSIS — Z7984 Long term (current) use of oral hypoglycemic drugs: Secondary | ICD-10-CM

## 2017-11-05 DIAGNOSIS — O2412 Pre-existing diabetes mellitus, type 2, in childbirth: Secondary | ICD-10-CM | POA: Diagnosis present

## 2017-11-05 DIAGNOSIS — D573 Sickle-cell trait: Secondary | ICD-10-CM | POA: Diagnosis present

## 2017-11-05 DIAGNOSIS — F17210 Nicotine dependence, cigarettes, uncomplicated: Secondary | ICD-10-CM | POA: Diagnosis present

## 2017-11-05 DIAGNOSIS — Z98891 History of uterine scar from previous surgery: Secondary | ICD-10-CM

## 2017-11-05 HISTORY — PX: TUBAL LIGATION: SHX77

## 2017-11-05 LAB — GLUCOSE, CAPILLARY
GLUCOSE-CAPILLARY: 104 mg/dL — AB (ref 70–99)
GLUCOSE-CAPILLARY: 102 mg/dL — AB (ref 70–99)
GLUCOSE-CAPILLARY: 179 mg/dL — AB (ref 70–99)
Glucose-Capillary: 97 mg/dL (ref 70–99)

## 2017-11-05 SURGERY — Surgical Case
Anesthesia: Spinal | Site: Abdomen | Wound class: Clean Contaminated

## 2017-11-05 MED ORDER — LACTATED RINGERS IV SOLN
INTRAVENOUS | Status: DC
  Administered 2017-11-05: 23:00:00 via INTRAVENOUS

## 2017-11-05 MED ORDER — SIMETHICONE 80 MG PO CHEW: 80.0000 mg | CHEWABLE_TABLET | ORAL | Status: DC | PRN

## 2017-11-05 MED ORDER — MIDAZOLAM HCL 2 MG/2ML IJ SOLN
INTRAMUSCULAR | Status: DC | PRN
  Administered 2017-11-05: 1 mg via INTRAVENOUS

## 2017-11-05 MED ORDER — NALBUPHINE HCL 10 MG/ML IJ SOLN: 5.0000 mg | Freq: Once | INTRAMUSCULAR | Status: DC | PRN

## 2017-11-05 MED ORDER — FENTANYL CITRATE (PF) 100 MCG/2ML IJ SOLN
INTRAMUSCULAR | Status: DC | PRN
  Administered 2017-11-05 (×2): 100 ug via INTRAVENOUS

## 2017-11-05 MED ORDER — OXYCODONE-ACETAMINOPHEN 5-325 MG PO TABS
1.0000 | ORAL_TABLET | Freq: Four times a day (QID) | ORAL | Status: DC | PRN
  Administered 2017-11-05 – 2017-11-06 (×3): 2 via ORAL
  Filled 2017-11-05 (×3): qty 2

## 2017-11-05 MED ORDER — PROMETHAZINE HCL 25 MG/ML IJ SOLN: 6.2500 mg | INTRAMUSCULAR | Status: DC | PRN

## 2017-11-05 MED ORDER — ACETAMINOPHEN 10 MG/ML IV SOLN: 1000.0000 mg | Freq: Once | INTRAVENOUS | Status: DC | PRN

## 2017-11-05 MED ORDER — FENTANYL CITRATE (PF) 100 MCG/2ML IJ SOLN
INTRAMUSCULAR | Status: AC
  Filled 2017-11-05: qty 2

## 2017-11-05 MED ORDER — SENNOSIDES-DOCUSATE SODIUM 8.6-50 MG PO TABS
2.0000 | ORAL_TABLET | ORAL | Status: DC
  Administered 2017-11-05 – 2017-11-07 (×3): 2 via ORAL
  Filled 2017-11-05 (×3): qty 2

## 2017-11-05 MED ORDER — PHENYLEPHRINE HCL 10 MG/ML IJ SOLN
INTRAMUSCULAR | Status: DC | PRN
  Administered 2017-11-05: 40 ug via INTRAVENOUS
  Administered 2017-11-05: 120 ug via INTRAVENOUS
  Administered 2017-11-05: 40 ug via INTRAVENOUS
  Administered 2017-11-05: 120 ug via INTRAVENOUS

## 2017-11-05 MED ORDER — DEXAMETHASONE SODIUM PHOSPHATE 4 MG/ML IJ SOLN
INTRAMUSCULAR | Status: DC | PRN
  Administered 2017-11-05: 4 mg via INTRAVENOUS

## 2017-11-05 MED ORDER — CHLOROPROCAINE HCL (PF) 3 % IJ SOLN
INTRAMUSCULAR | Status: AC
  Filled 2017-11-05: qty 20

## 2017-11-05 MED ORDER — DIPHENHYDRAMINE HCL 25 MG PO CAPS: 25.0000 mg | ORAL_CAPSULE | Freq: Four times a day (QID) | ORAL | Status: DC | PRN

## 2017-11-05 MED ORDER — SIMETHICONE 80 MG PO CHEW
80.0000 mg | CHEWABLE_TABLET | ORAL | Status: DC
  Administered 2017-11-05 – 2017-11-07 (×3): 80 mg via ORAL
  Filled 2017-11-05 (×3): qty 1

## 2017-11-05 MED ORDER — NALBUPHINE HCL 10 MG/ML IJ SOLN: 5.0000 mg | INTRAMUSCULAR | Status: DC | PRN

## 2017-11-05 MED ORDER — SIMETHICONE 80 MG PO CHEW
80.0000 mg | CHEWABLE_TABLET | Freq: Three times a day (TID) | ORAL | Status: DC
  Administered 2017-11-05 – 2017-11-08 (×7): 80 mg via ORAL
  Filled 2017-11-05 (×7): qty 1

## 2017-11-05 MED ORDER — COCONUT OIL OIL
1.0000 "application " | TOPICAL_OIL | Status: DC | PRN
  Filled 2017-11-05: qty 120

## 2017-11-05 MED ORDER — SCOPOLAMINE 1 MG/3DAYS TD PT72: 1.0000 | MEDICATED_PATCH | Freq: Once | TRANSDERMAL | Status: DC

## 2017-11-05 MED ORDER — LACTATED RINGERS IV SOLN
INTRAVENOUS | Status: DC | PRN
  Administered 2017-11-05 (×2): via INTRAVENOUS

## 2017-11-05 MED ORDER — DIPHENHYDRAMINE HCL 50 MG/ML IJ SOLN: 12.5000 mg | INTRAMUSCULAR | Status: DC | PRN

## 2017-11-05 MED ORDER — KETOROLAC TROMETHAMINE 30 MG/ML IJ SOLN
30.0000 mg | Freq: Four times a day (QID) | INTRAMUSCULAR | Status: AC | PRN
  Administered 2017-11-05: 30 mg via INTRAVENOUS

## 2017-11-05 MED ORDER — PHENYLEPHRINE 8 MG IN D5W 100 ML (0.08MG/ML) PREMIX OPTIME
INJECTION | INTRAVENOUS | Status: DC | PRN
  Administered 2017-11-05: 60 ug/min via INTRAVENOUS

## 2017-11-05 MED ORDER — OXYTOCIN 10 UNIT/ML IJ SOLN
INTRAMUSCULAR | Status: AC
  Filled 2017-11-05: qty 4

## 2017-11-05 MED ORDER — DEXAMETHASONE SODIUM PHOSPHATE 4 MG/ML IJ SOLN
INTRAMUSCULAR | Status: AC
  Filled 2017-11-05: qty 1

## 2017-11-05 MED ORDER — GLYBURIDE 1.25 MG PO TABS
1.2500 mg | ORAL_TABLET | Freq: Two times a day (BID) | ORAL | Status: DC
  Administered 2017-11-05 – 2017-11-07 (×4): 1.25 mg via ORAL
  Filled 2017-11-05 (×6): qty 1

## 2017-11-05 MED ORDER — DIBUCAINE 1 % RE OINT: 1.0000 "application " | TOPICAL_OINTMENT | RECTAL | Status: DC | PRN

## 2017-11-05 MED ORDER — NALOXONE HCL 4 MG/10ML IJ SOLN: 1.0000 ug/kg/h | INTRAVENOUS | Status: DC | PRN

## 2017-11-05 MED ORDER — NALOXONE HCL 0.4 MG/ML IJ SOLN: 0.4000 mg | INTRAMUSCULAR | Status: DC | PRN

## 2017-11-05 MED ORDER — MENTHOL 3 MG MT LOZG: 1.0000 | LOZENGE | OROMUCOSAL | Status: DC | PRN

## 2017-11-05 MED ORDER — ALBUTEROL SULFATE (2.5 MG/3ML) 0.083% IN NEBU
2.5000 mg | INHALATION_SOLUTION | Freq: Once | RESPIRATORY_TRACT | Status: AC
  Administered 2017-11-05: 2.5 mg via RESPIRATORY_TRACT
  Filled 2017-11-05: qty 3

## 2017-11-05 MED ORDER — ONDANSETRON HCL 4 MG/2ML IJ SOLN
INTRAMUSCULAR | Status: DC | PRN
  Administered 2017-11-05: 4 mg via INTRAVENOUS

## 2017-11-05 MED ORDER — ONDANSETRON HCL 4 MG/2ML IJ SOLN
INTRAMUSCULAR | Status: AC
  Filled 2017-11-05: qty 2

## 2017-11-05 MED ORDER — ONDANSETRON HCL 4 MG/2ML IJ SOLN: 4.0000 mg | Freq: Three times a day (TID) | INTRAMUSCULAR | Status: DC | PRN

## 2017-11-05 MED ORDER — METOCLOPRAMIDE HCL 5 MG/ML IJ SOLN
INTRAMUSCULAR | Status: DC | PRN
  Administered 2017-11-05: 10 mg via INTRAVENOUS

## 2017-11-05 MED ORDER — KETOROLAC TROMETHAMINE 30 MG/ML IJ SOLN
30.0000 mg | Freq: Four times a day (QID) | INTRAMUSCULAR | Status: AC | PRN
  Filled 2017-11-05: qty 1

## 2017-11-05 MED ORDER — IBUPROFEN 600 MG PO TABS
600.0000 mg | ORAL_TABLET | Freq: Four times a day (QID) | ORAL | Status: DC
  Administered 2017-11-05 – 2017-11-08 (×10): 600 mg via ORAL
  Filled 2017-11-05 (×11): qty 1

## 2017-11-05 MED ORDER — SCOPOLAMINE 1 MG/3DAYS TD PT72
MEDICATED_PATCH | TRANSDERMAL | Status: DC | PRN
  Administered 2017-11-05: 1 via TRANSDERMAL

## 2017-11-05 MED ORDER — ZOLPIDEM TARTRATE 5 MG PO TABS: 5.0000 mg | ORAL_TABLET | Freq: Every evening | ORAL | Status: DC | PRN

## 2017-11-05 MED ORDER — SOD CITRATE-CITRIC ACID 500-334 MG/5ML PO SOLN: 30.0000 mL | Freq: Once | ORAL | Status: DC

## 2017-11-05 MED ORDER — HYDROMORPHONE HCL 1 MG/ML IJ SOLN: 0.2500 mg | INTRAMUSCULAR | Status: DC | PRN

## 2017-11-05 MED ORDER — CEFAZOLIN SODIUM-DEXTROSE 2-4 GM/100ML-% IV SOLN
2.0000 g | INTRAVENOUS | Status: AC
  Administered 2017-11-05: 2 g via INTRAVENOUS

## 2017-11-05 MED ORDER — OXYTOCIN 10 UNIT/ML IJ SOLN
INTRAVENOUS | Status: DC | PRN
  Administered 2017-11-05: 40 [IU] via INTRAVENOUS

## 2017-11-05 MED ORDER — HYDROCODONE-ACETAMINOPHEN 7.5-325 MG PO TABS: 1.0000 | ORAL_TABLET | Freq: Once | ORAL | Status: DC | PRN

## 2017-11-05 MED ORDER — MEPERIDINE HCL 25 MG/ML IJ SOLN: 6.2500 mg | INTRAMUSCULAR | Status: DC | PRN

## 2017-11-05 MED ORDER — LACTATED RINGERS IV SOLN
INTRAVENOUS | Status: DC
  Administered 2017-11-05: 10:00:00 via INTRAVENOUS

## 2017-11-05 MED ORDER — MORPHINE SULFATE (PF) 0.5 MG/ML IJ SOLN
INTRAMUSCULAR | Status: AC
  Filled 2017-11-05: qty 10

## 2017-11-05 MED ORDER — WITCH HAZEL-GLYCERIN EX PADS: 1.0000 "application " | MEDICATED_PAD | CUTANEOUS | Status: DC | PRN

## 2017-11-05 MED ORDER — ACETAMINOPHEN 325 MG PO TABS
650.0000 mg | ORAL_TABLET | ORAL | Status: DC | PRN
  Administered 2017-11-05: 650 mg via ORAL
  Filled 2017-11-05: qty 2

## 2017-11-05 MED ORDER — TETANUS-DIPHTH-ACELL PERTUSSIS 5-2.5-18.5 LF-MCG/0.5 IM SUSP: 0.5000 mL | Freq: Once | INTRAMUSCULAR | Status: DC

## 2017-11-05 MED ORDER — OXYTOCIN 40 UNITS IN LACTATED RINGERS INFUSION - SIMPLE MED: 2.5000 [IU]/h | INTRAVENOUS | Status: AC

## 2017-11-05 MED ORDER — SODIUM CHLORIDE 0.9% FLUSH: 3.0000 mL | INTRAVENOUS | Status: DC | PRN

## 2017-11-05 MED ORDER — PHENYLEPHRINE 40 MCG/ML (10ML) SYRINGE FOR IV PUSH (FOR BLOOD PRESSURE SUPPORT)
PREFILLED_SYRINGE | INTRAVENOUS | Status: AC
  Filled 2017-11-05: qty 10

## 2017-11-05 MED ORDER — BUPIVACAINE HCL (PF) 0.5 % IJ SOLN
INTRAMUSCULAR | Status: DC | PRN
  Administered 2017-11-05: 30 mL

## 2017-11-05 MED ORDER — LACTATED RINGERS IV SOLN
INTRAVENOUS | Status: DC | PRN
  Administered 2017-11-05: 14:00:00 via INTRAVENOUS

## 2017-11-05 MED ORDER — DIPHENHYDRAMINE HCL 25 MG PO CAPS
25.0000 mg | ORAL_CAPSULE | ORAL | Status: DC | PRN
  Administered 2017-11-05 – 2017-11-06 (×3): 25 mg via ORAL
  Filled 2017-11-05 (×3): qty 1

## 2017-11-05 MED ORDER — PRENATAL MULTIVITAMIN CH
1.0000 | ORAL_TABLET | Freq: Every day | ORAL | Status: DC
  Administered 2017-11-06 – 2017-11-07 (×2): 1 via ORAL
  Filled 2017-11-05 (×2): qty 1

## 2017-11-05 MED ORDER — PHENYLEPHRINE 8 MG IN D5W 100 ML (0.08MG/ML) PREMIX OPTIME
INJECTION | INTRAVENOUS | Status: AC
  Filled 2017-11-05: qty 100

## 2017-11-05 MED ORDER — MIDAZOLAM HCL 2 MG/2ML IJ SOLN
INTRAMUSCULAR | Status: AC
  Filled 2017-11-05: qty 2

## 2017-11-05 MED ORDER — SCOPOLAMINE 1 MG/3DAYS TD PT72
MEDICATED_PATCH | TRANSDERMAL | Status: AC
  Filled 2017-11-05: qty 1

## 2017-11-05 MED ORDER — BUPIVACAINE HCL (PF) 0.5 % IJ SOLN
INTRAMUSCULAR | Status: AC
  Filled 2017-11-05: qty 30

## 2017-11-05 MED ORDER — SODIUM CHLORIDE 0.9% FLUSH
INTRAVENOUS | Status: AC
  Filled 2017-11-05: qty 9

## 2017-11-05 SURGICAL SUPPLY — 37 items
APL SKNCLS STERI-STRIP NONHPOA (GAUZE/BANDAGES/DRESSINGS) ×1
BARRIER ADHS 3X4 INTERCEED (GAUZE/BANDAGES/DRESSINGS) IMPLANT
BENZOIN TINCTURE PRP APPL 2/3 (GAUZE/BANDAGES/DRESSINGS) ×2 IMPLANT
BRR ADH 4X3 ABS CNTRL BYND (GAUZE/BANDAGES/DRESSINGS)
CHLORAPREP W/TINT 26ML (MISCELLANEOUS) ×3 IMPLANT
CLAMP CORD UMBIL (MISCELLANEOUS) IMPLANT
CLIP FILSHIE TUBAL LIGA STRL (Clip) ×2 IMPLANT
CLOSURE WOUND 1/2 X4 (GAUZE/BANDAGES/DRESSINGS) ×1
CLOTH BEACON ORANGE TIMEOUT ST (SAFETY) ×3 IMPLANT
DRSG OPSITE POSTOP 4X10 (GAUZE/BANDAGES/DRESSINGS) ×3 IMPLANT
ELECT REM PT RETURN 9FT ADLT (ELECTROSURGICAL) ×3
ELECTRODE REM PT RTRN 9FT ADLT (ELECTROSURGICAL) ×1 IMPLANT
EXTRACTOR VACUUM KIWI (MISCELLANEOUS) IMPLANT
GLOVE BIO SURGEON STRL SZ 6.5 (GLOVE) ×2 IMPLANT
GLOVE BIO SURGEONS STRL SZ 6.5 (GLOVE) ×1
GLOVE BIOGEL PI IND STRL 7.0 (GLOVE) ×2 IMPLANT
GLOVE BIOGEL PI INDICATOR 7.0 (GLOVE) ×4
GOWN STRL REUS W/TWL LRG LVL3 (GOWN DISPOSABLE) ×6 IMPLANT
HEMOSTAT ARISTA ABSORB 3G PWDR (MISCELLANEOUS) ×2 IMPLANT
KIT ABG SYR 3ML LUER SLIP (SYRINGE) IMPLANT
NDL HYPO 25X5/8 SAFETYGLIDE (NEEDLE) IMPLANT
NEEDLE HYPO 22GX1.5 SAFETY (NEEDLE) IMPLANT
NEEDLE HYPO 25X5/8 SAFETYGLIDE (NEEDLE) IMPLANT
NS IRRIG 1000ML POUR BTL (IV SOLUTION) ×3 IMPLANT
PACK C SECTION WH (CUSTOM PROCEDURE TRAY) ×3 IMPLANT
PAD OB MATERNITY 4.3X12.25 (PERSONAL CARE ITEMS) ×3 IMPLANT
PENCIL SMOKE EVAC W/HOLSTER (ELECTROSURGICAL) ×3 IMPLANT
RETRACTOR WND ALEXIS 25 LRG (MISCELLANEOUS) IMPLANT
RTRCTR WOUND ALEXIS 25CM LRG (MISCELLANEOUS)
STRIP CLOSURE SKIN 1/2X4 (GAUZE/BANDAGES/DRESSINGS) ×1 IMPLANT
SUT VIC AB 0 CT1 36 (SUTURE) ×18 IMPLANT
SUT VIC AB 2-0 CT1 27 (SUTURE) ×3
SUT VIC AB 2-0 CT1 TAPERPNT 27 (SUTURE) ×1 IMPLANT
SUT VIC AB 4-0 PS2 27 (SUTURE) ×3 IMPLANT
SYR CONTROL 10ML LL (SYRINGE) IMPLANT
TOWEL OR 17X24 6PK STRL BLUE (TOWEL DISPOSABLE) ×3 IMPLANT
TRAY FOLEY W/BAG SLVR 14FR LF (SET/KITS/TRAYS/PACK) IMPLANT

## 2017-11-05 NOTE — Progress Notes (Signed)
When the night shift RN came on her shift and introduced herself to the patient, the patient stated that she felt shaky and felt like her sugars were low. She also stated that she takes GlyBuride BID with meals at home and is on a regular diet that included carbohydrates. The RN called the physician on call and asked if we could put in orders to include glyburide in her orders BID with meals as well as changing the diet to regular. The RN will continue to monitor.

## 2017-11-05 NOTE — Progress Notes (Signed)
Initial this pt's documentation placed on [name] chart

## 2017-11-05 NOTE — Progress Notes (Signed)
toradol offered pt still refuses

## 2017-11-05 NOTE — Transfer of Care (Signed)
Immediate Anesthesia Transfer of Care Note  Patient: [name]  Procedure(s) Performed: REPEAT CESAREAN SECTION (N/A Abdomen)  Patient Location: PACU  Anesthesia Type:Spinal  Level of Consciousness: awake, alert  and oriented  Airway & Oxygen Therapy: Patient Spontanous Breathing  Post-op Assessment: Report given to RN and Post -op Vital signs reviewed and stable  Post vital signs: Reviewed and stable  Last Vitals:  Vitals Value Taken Time  BP    Temp    Pulse    Resp    SpO2      Last Pain:  Vitals:   11/05/17 0949  TempSrc: Oral  PainSc: 0-No pain      Patients Stated Pain Goal: 4 (51/10/21 1173)  Complications: No apparent anesthesia complications

## 2017-11-05 NOTE — Lactation Note (Signed)
This note was copied from a baby's chart. Lactation Consultation Note  Patient Name: [name] Today's Date: 11/05/2017 Reason for consult: Initial assessment;Term;Maternal endocrine disorder Type of Endocrine Disorder?: Diabetes P3, 8 hour female infant, Mom 3rd C/S and DM . Per mom, interest in applying for [facility] lives in [place] is experienced BF, she BF her son for 6 months but stopped due to [facility] and conflict w/ meds and breastfeeding. LC entered room mom was ready to feed infant. Infant was sleepy, mom hand expressed 37ml of breast milk that was given to infant on spoon. Infant start cuing after receiving EBM and mom BF infant on right breast using the cross-cradle position, infant mouth wide w/ tongue down, audible swallowing was heard infant BF for 8 mins. LC discussed I&O w/ parents. Mom encouraged to feed baby 8-12 times/24 hours and with feeding cues. Reviewed Baby & Me book's Breastfeeding Basics.  Mom made aware of O/P services, breastfeeding support groups, community resources, and our phone # for post-discharge questions.     Maternal Data Formula Feeding for Exclusion: No Has patient been taught Hand Expression?: Yes(LC taught hand expression mom expressed 7 ml of breastmilk) Does the patient have breastfeeding experience prior to this delivery?: Yes  Feeding Length of feed: 8 min  LATCH Score Latch: Grasps breast easily, tongue down, lips flanged, rhythmical sucking.  Audible Swallowing: Spontaneous and intermittent  Type of Nipple: Everted at rest and after stimulation  Comfort (Breast/Nipple): Soft / non-tender  Hold (Positioning): Assistance needed to correctly position infant at breast and maintain latch.  LATCH Score: 9  Interventions Interventions: Breast feeding basics reviewed;Support pillows;Assisted with latch;Hand express;Breast compression;Adjust position  Lactation Tools Discussed/Used WIC Program: No(Interest in applying lives  in [place].)   Consult Status Consult Status: Follow-up Date: 11/06/17 Follow-up type: In-patient    Vicente Serene 11/05/2017, 10:25 PM

## 2017-11-05 NOTE — H&P (Signed)
[name] is a 32 y.o. female presenting for repeat cesarean section and bilateral tubal ligation. OB History    Gravida  7   Para  2   Term  2   Preterm      AB  4   Living  2     SAB  3   TAB  1   Ectopic      Multiple  0   Live Births  2          Past Medical History:  Diagnosis Date  . Alcohol abuse   . Anemia   . Asthma    rarely uses inhaler  . Bipolar disorder ([place])    HX PPD after 2014 delivery, no meds  . Bronchitis   . 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 Fibroid   . Hypertension   . Missed ab    x 2, one requiring D & E  . Pancreatic cyst   . Pancreatitis    diet controlled  . Pelvic inflammatory disease   . Plantar fasciitis   . Polycystic ovary syndrome   . Renal cyst   . Sickle cell trait [facility])    Past Surgical History:  Procedure Laterality Date  . CESAREAN SECTION N/A 05/23/2012   Procedure: CESAREAN SECTION;  Surgeon: Lahoma Crocker, MD;  Location: [facility];  Service: Obstetrics;  Laterality: N/A;  primary  . CESAREAN SECTION N/A 04/18/2014   Procedure: REPEAT CESAREAN SECTION;  Surgeon: Shelly Bombard, MD;  Location: [facility];  Service: Obstetrics;  Laterality: N/A;  . DILATION AND CURETTAGE OF UTERUS      for MAB  . EUS N/A 06/11/2015   Procedure: UPPER ENDOSCOPIC ULTRASOUND (EUS) RADIAL;  Surgeon: Milus Banister, MD;  Location: [facility];  Service: Endoscopy;  Laterality: N/A;  . PANCREAS SURGERY  2012   s/p partial pancreatectomy  . SPLENECTOMY, TOTAL  2012  . WISDOM TOOTH EXTRACTION     Family History: family history includes Asthma in her father; Diabetes in her mother; Hypertension in her mother. Social History:  reports that she has been smoking cigarettes. She has a 3.25 pack-year smoking history. She has never used smokeless tobacco. She reports that she does not drink alcohol or use drugs.     Maternal Diabetes: Yes:  Diabetes Type:   Pre-pregnancy Genetic Screening: Normal Maternal Ultrasounds/Referrals: Normal Fetal Ultrasounds or other Referrals:   Maternal Substance Abuse:  No Significant Maternal Medications:  Meds include: Other: glyburide Significant Maternal Lab Results:  None Other Comments:  None  ROS Maternal Medical History:  Fetal activity: Perceived fetal activity is normal.    Prenatal Complications - Diabetes: type 2. Diabetes is managed by oral agent (monotherapy).        Blood pressure 113/77, pulse (!) 103, temperature 98.3 F (36.8 C), temperature source Oral, resp. rate 18, height 5' (1.524 m), weight 70 kg, last menstrual period 02/05/2017, SpO2 100 %. Maternal Exam:  Abdomen: Patient reports no abdominal tenderness. Surgical scars: low transverse.   Introitus: not evaluated.   Cervix: not evaluated.   Physical Exam  Vitals reviewed. Constitutional: She appears well-developed. She appears distressed.  Neck: Normal range of motion.  Respiratory: Effort normal.  Musculoskeletal: Normal range of motion.  Skin: Skin is warm and dry.  Psychiatric: She has a normal mood and affect. Her behavior is normal.    Prenatal labs: ABO, Rh: --/--/B POS (08/30 1045) Antibody: NEG (08/30 1045) Rubella: 2.31 (  02/04 1056) RPR: Non Reactive (08/30 1045)  HBsAg: Negative (02/04 1056)  HIV: Non Reactive (06/12 1625)  GBS: Positive (08/15 1156)   Assessment/Plan: Repeat cesarean section and BTL The risks of cesarean section discussed with the patient included but were not limited to: bleeding which may require transfusion or reoperation; infection which may require antibiotics; injury to bowel, bladder, ureters or other surrounding organs; injury to the fetus; need for additional procedures including hysterectomy in the event of a life-threatening hemorrhage; placental abnormalities wth subsequent pregnancies, incisional problems, thromboembolic phenomenon and other postoperative/anesthesia  complications. The patient concurred with the proposed plan, giving informed written consent for the procedure.   Patient has been NPO since 0000 she will remain NPO for procedure. Anesthesia and OR aware. Preoperative prophylactic antibiotics and SCDs ordered on call to the OR.  To OR when ready. 32 y.o. [id number] with undesired fertility desires permanent sterilization. Risks and benefits of procedure discussed with patient including permanence of method, bleeding, infection, injury to surrounding organs and need for additional procedures. Risk failure of 0.5-1% with increased risk of ectopic gestation if pregnancy occurs was also discussed with patient. Patient verbalized understanding and all questions were answered.  Alvina Filbert Roselie Awkward MD 11/05/2017 12:42 PM       [name] 11/05/2017, 12:23 PM

## 2017-11-05 NOTE — Progress Notes (Signed)
Toradol offered pt refused stating she did not like the way it felt.  Explained to pt that I am unable to give her any narcotic med.  Pt verbalized understanding.  Pt also c/o itching nubain offered pt refused due to possible decrease in duramorph

## 2017-11-05 NOTE — Anesthesia Postprocedure Evaluation (Signed)
Anesthesia Post Note  Patient: [name]  Procedure(s) Performed: REPEAT CESAREAN SECTION (N/A Abdomen)     Patient location during evaluation: PACU Anesthesia Type: Spinal Level of consciousness: oriented and awake and alert Pain management: pain level controlled Vital Signs Assessment: post-procedure vital signs reviewed and stable Respiratory status: spontaneous breathing, respiratory function stable and patient connected to nasal cannula oxygen Cardiovascular status: blood pressure returned to baseline and stable Postop Assessment: no headache, no backache and no apparent nausea or vomiting Anesthetic complications: no    Last Vitals:  Vitals:   11/05/17 1620 11/05/17 1722  BP: 113/77 108/70  Pulse: 67 69  Resp: 18 18  Temp: 36.5 C 36.8 C  SpO2: 100% 100%    Last Pain:  Vitals:   11/05/17 1722  TempSrc: Oral  PainSc:    Pain Goal: Patients Stated Pain Goal: 4 (11/05/17 0949)               Barnet Glasgow

## 2017-11-05 NOTE — Op Note (Signed)
[name] PROCEDURE DATE: 11/05/2017  PREOPERATIVE DIAGNOSES: Intrauterine pregnancy at [redacted]w[redacted]d weeks gestation; patient declines vag del attempt after 2 previous sections. Undesired fertility  POSTOPERATIVE DIAGNOSES: The same  PROCEDURE: repeat cesarean section and bilateral tubal ligation  SURGEON:  Woodroe Mode, MD  ASSISTANT:  none  ANESTHESIOLOGY TEAM: Anesthesiologist: Barnet Glasgow, MD CRNA: Flossie Dibble, CRNA  INDICATIONS: [name] is a 32 y.o. [phone] at [redacted]w[redacted]d here for cesarean section secondary to the indications listed under preoperative diagnoses; please see preoperative note for further details.  The risks of cesarean section were discussed with the patient including but were not limited to: bleeding which may require transfusion or reoperation; infection which may require antibiotics; injury to bowel, bladder, ureters or other surrounding organs; injury to the fetus; need for additional procedures including hysterectomy in the event of a life-threatening hemorrhage; placental abnormalities wth subsequent pregnancies, incisional problems, thromboembolic phenomenon and other postoperative/anesthesia complications.   The patient concurred with the proposed plan, giving informed written consent for the procedure.    FINDINGS:  Viable female infant in cephalic presentation.  Apgars 8 and 9.  Clear amniotic fluid.  Intact placenta, three vessel cord.  Normal uterus, fallopian tubes and ovaries bilaterally.  ANESTHESIA: Spinal  INTRAVENOUS FLUIDS: 1500 ml   ESTIMATED BLOOD LOSS: 600 ml URINE OUTPUT:  100 ml SPECIMENS: Placenta sent to L&D COMPLICATIONS: None immediate  PROCEDURE IN DETAIL:  The patient preoperatively received intravenous antibiotics and had sequential compression devices applied to her lower extremities.  She was then taken to the operating room where spinal anesthesia was administered and was found to be adequate. She was then placed in  a dorsal supine position with a leftward tilt, and prepped and draped in a sterile manner.  A foley catheter was placed into her bladder and attached to constant gravity.  After an adequate timeout was performed, a Pfannenstiel skin incision was made with scalpel on her preexisting scar and carried through to the underlying layer of fascia. The fascia was incised in the midline, and this incision was extended bilaterally using the Mayo scissors.  Kocher clamps were applied to the superior aspect of the fascial incision and the underlying rectus muscles were dissected off bluntly.  A similar process was carried out on the inferior aspect of the fascial incision. The rectus muscles were separated in the midline and the peritoneum was entered bluntly. The Alexis self-retaining retractor was introduced into the abdominal cavity.  Attention was turned to the lower uterine segment where a low transverse hysterotomy was made with a scalpel and extended bilaterally bluntly.  The infant was successfully delivered, the cord was clamped and cut after one minute, and the infant was handed over to the awaiting neonatology team. Uterine massage was then administered, and the placenta delivered intact with a three-vessel cord. The uterus was then cleared of clots and debris.  The hysterotomy was closed with 0 Vicryl in a running locked fashion, and an imbricating layer was also placed with 0 Vicryl. The fallopian tubes were identified to the fimbria and the right tube was elevated at midpoint and Filshie clip was applied in the correct fashion. Same was done on the left.The pelvis was cleared of all clot and debris. Hemostasis was confirmed on all surfaces.  The retractor was removed.  The peritoneum was closed with a 0 Vicryl running stitch . Hemostasis was assured in the subfascial layer with cautery and Arista. The fascia was then closed using 0 Vicryl  The subcutaneous layer was irrigated, reapproximated with 2-0 plain gut  interrupted stitches, and the skin was closed with a 4-0 Vicryl subcuticular stitch. The patient tolerated the procedure well. Sponge, instrument and needle counts were correct x 3.  She was taken to the recovery room in stable condition.    Woodroe Mode, MD [place], [facility], [place]

## 2017-11-05 NOTE — Anesthesia Preprocedure Evaluation (Addendum)
Anesthesia Evaluation  Patient identified by MRN, date of birth, ID band Patient awake    Reviewed: Allergy & Precautions, NPO status , Patient's Chart, lab work & pertinent test results  Airway Mallampati: II  TM Distance: >3 FB Neck ROM: Full    Dental no notable dental hx. (+) Teeth Intact, Dental Advisory Given   Pulmonary asthma , Current Smoker,    Pulmonary exam normal  + wheezing      Cardiovascular hypertension, Pt. on medications Normal cardiovascular exam Rhythm:Regular Rate:Normal     Neuro/Psych    GI/Hepatic   Endo/Other  diabetes, Type 2  Renal/GU      Musculoskeletal   Abdominal   Peds  Hematology  (+) anemia ,   Anesthesia Other Findings   Reproductive/Obstetrics                           Anesthesia Physical Anesthesia Plan  ASA: III  Anesthesia Plan: Spinal   Post-op Pain Management:    Induction:   PONV Risk Score and Plan:   Airway Management Planned: Natural Airway  Additional Equipment:   Intra-op Plan:   Post-operative Plan:   Informed Consent: I have reviewed the patients History and Physical, chart, labs and discussed the procedure including the risks, benefits and alternatives for the proposed anesthesia with the patient or authorized representative who has indicated his/her understanding and acceptance.   Dental advisory given  Plan Discussed with: CRNA  Anesthesia Plan Comments: (Repeat C/S w PPTL for HTN , DM Asthma  . Will give albuterol tx, Bicitra  before we begin this AM.)      Anesthesia Quick Evaluation

## 2017-11-06 LAB — CBC
HEMATOCRIT: 28.7 % — AB (ref 36.0–46.0)
HEMOGLOBIN: 9.7 g/dL — AB (ref 12.0–15.0)
MCH: 23.3 pg — AB (ref 26.0–34.0)
MCHC: 33.8 g/dL (ref 30.0–36.0)
MCV: 69 fL — ABNORMAL LOW (ref 78.0–100.0)
Platelets: 401 10*3/uL — ABNORMAL HIGH (ref 150–400)
RBC: 4.16 MIL/uL (ref 3.87–5.11)
RDW: 14.1 % (ref 11.5–15.5)
WBC: 15.4 10*3/uL — ABNORMAL HIGH (ref 4.0–10.5)

## 2017-11-06 LAB — GLUCOSE, CAPILLARY
Glucose-Capillary: 72 mg/dL (ref 70–99)
Glucose-Capillary: 88 mg/dL (ref 70–99)
Glucose-Capillary: 64 mg/dL — ABNORMAL LOW (ref 70–99)
Glucose-Capillary: 81 mg/dL (ref 70–99)

## 2017-11-06 MED ORDER — OXYCODONE-ACETAMINOPHEN 5-325 MG PO TABS
2.0000 | ORAL_TABLET | ORAL | Status: DC | PRN
  Administered 2017-11-06 – 2017-11-07 (×3): 2 via ORAL
  Filled 2017-11-06 (×4): qty 2

## 2017-11-06 MED ORDER — OXYCODONE-ACETAMINOPHEN 5-325 MG PO TABS
1.0000 | ORAL_TABLET | ORAL | Status: DC | PRN
  Administered 2017-11-08: 1 via ORAL
  Filled 2017-11-06: qty 1

## 2017-11-06 NOTE — Lactation Note (Signed)
This note was copied from a baby's chart. Lactation Consultation Note  Patient Name: [name] Today's Date: 11/06/2017 Reason for consult: Follow-up assessment;Term;Maternal endocrine disorder Type of Endocrine Disorder?: Diabetes  P3 mother whose infant is now 38 hours old  Baby was sleeping when I arrived.  Mother is experienced with breastfeeding and believes baby is doing well so far.  Mother aware of feeding cues and has no questions/concerns related to breastfeeding.  She stated, "This is my third time around."  She will continue to feed 8-12 times/24 hours or sooner if baby shows cues.  She is familiar with hand expression.  Mother will call for assistance as needed.  Family present.   Maternal Data Formula Feeding for Exclusion: No Has patient been taught Hand Expression?: Yes Does the patient have breastfeeding experience prior to this delivery?: Yes  Feeding Feeding Type: Breast Fed  LATCH Score                   Interventions    Lactation Tools Discussed/Used     Consult Status Consult Status: Follow-up Date: 11/07/17 Follow-up type: In-patient    Abem Shaddix R Devansh Riese 11/06/2017, 2:52 PM

## 2017-11-06 NOTE — Progress Notes (Addendum)
Subjective: Postpartum Day 1: Cesarean Delivery Patient reports tolerating PO, + flatus and no problems voiding.    Objective: Vital signs in last 24 hours: Temp:  [97.7 F (36.5 C)-98.3 F (36.8 C)] 98.2 F (36.8 C) (09/02 0045) Pulse Rate:  [67-103] 67 (09/01 2000) Resp:  [14-24] 18 (09/01 2000) BP: (98-129)/(60-83) 98/62 (09/01 2000) SpO2:  [98 %-100 %] 100 % (09/02 0045) Weight:  [70 kg] 70 kg (09/01 0949) Vitals:   11/05/17 1722 11/05/17 1841 11/05/17 2000 11/06/17 0045  BP: 108/70 98/60 98/62    Pulse: 69 71 67   Resp: 18 18 18    Temp: 98.2 F (36.8 C) 98.2 F (36.8 C) 98.1 F (36.7 C) 98.2 F (36.8 C)  TempSrc: Oral Oral Oral Oral  SpO2: 100% 98% 100% 100%  Weight:      Height:        Physical Exam:  General: alert, cooperative and no distress Lochia: appropriate Uterine Fundus: firm Incision: healing well, no significant drainage DVT Evaluation: No evidence of DVT seen on physical exam.  Recent Labs    11/03/17 1045 11/06/17 0543  HGB 10.5* 9.7*  HCT 31.2* 28.7*    Assessment/Plan: Status post Cesarean section. Doing well postoperatively.  Continue current care.  [name] 11/06/2017, 6:40 AM

## 2017-11-06 NOTE — Anesthesia Postprocedure Evaluation (Signed)
Anesthesia Post Note  Patient: Leora Y Mcfadden  Procedure(s) Performed: REPEAT CESAREAN SECTION (N/A Abdomen)     Patient location during evaluation: Mother Baby Anesthesia Type: Spinal Level of consciousness: awake, awake and alert, oriented and patient cooperative Pain management: pain level controlled Vital Signs Assessment: post-procedure vital signs reviewed and stable Respiratory status: spontaneous breathing, nonlabored ventilation and respiratory function stable Cardiovascular status: stable Postop Assessment: no headache, no backache, patient able to bend at knees and no apparent nausea or vomiting Anesthetic complications: no Comments: Patient unsatisfied with anesthesia/spinal. She said she alerted the anesthesiologist that she felt the spinal on one side of her body vs. The other side, but felt that the anesthesiologist didn't listen to her. She said that during the operation, she felt pain on one side of her body and that she did get medication to help with that, but that it didn't last long enough. She said that she did not care for the anesthesiologist's attitude and that this was her worst C-section so far (it is her 18rd).    Last Vitals:  Vitals:   11/06/17 0045 11/06/17 0530  BP:  103/64  Pulse:  74  Resp:  18  Temp: 36.8 C 36.7 C  SpO2: 100%     Last Pain:  Vitals:   11/06/17 0530  TempSrc:   PainSc: 6    Pain Goal: Patients Stated Pain Goal: 4 (11/05/17 0949)               Demitrus Francisco L

## 2017-11-06 NOTE — Addendum Note (Signed)
Addendum  created 11/06/17 0723 by Raenette Rover, CRNA   Sign clinical note

## 2017-11-07 ENCOUNTER — Other Ambulatory Visit: Payer: Medicaid Other

## 2017-11-07 ENCOUNTER — Encounter (HOSPITAL_COMMUNITY): Payer: Self-pay

## 2017-11-07 ENCOUNTER — Encounter: Payer: Medicaid Other | Admitting: Obstetrics and Gynecology

## 2017-11-07 LAB — CBC WITH DIFFERENTIAL/PLATELET
Basophils Absolute: 0 10*3/uL (ref 0.0–0.1)
Basophils Relative: 0 %
Eosinophils Absolute: 0.1 10*3/uL (ref 0.0–0.7)
Eosinophils Relative: 1 %
HCT: 29 % — ABNORMAL LOW (ref 36.0–46.0)
Hemoglobin: 9.8 g/dL — ABNORMAL LOW (ref 12.0–15.0)
Lymphocytes Relative: 26 %
Lymphs Abs: 2.7 10*3/uL (ref 0.7–4.0)
MCH: 23.6 pg — ABNORMAL LOW (ref 26.0–34.0)
MCHC: 33.8 g/dL (ref 30.0–36.0)
MCV: 69.7 fL — ABNORMAL LOW (ref 78.0–100.0)
Monocytes Absolute: 0.4 10*3/uL (ref 0.1–1.0)
Monocytes Relative: 4 %
Neutro Abs: 7.3 10*3/uL (ref 1.7–7.7)
Neutrophils Relative %: 69 %
Platelets: 378 10*3/uL (ref 150–400)
RBC: 4.16 MIL/uL (ref 3.87–5.11)
RDW: 14 % (ref 11.5–15.5)
WBC: 10.5 10*3/uL (ref 4.0–10.5)

## 2017-11-07 LAB — LIPASE, BLOOD: Lipase: 20 U/L (ref 11–51)

## 2017-11-07 LAB — GLUCOSE, CAPILLARY
GLUCOSE-CAPILLARY: 78 mg/dL (ref 70–99)
GLUCOSE-CAPILLARY: 74 mg/dL (ref 70–99)
Glucose-Capillary: 47 mg/dL — ABNORMAL LOW (ref 70–99)
Glucose-Capillary: 56 mg/dL — ABNORMAL LOW (ref 70–99)
Glucose-Capillary: 98 mg/dL (ref 70–99)
Glucose-Capillary: 86 mg/dL (ref 70–99)

## 2017-11-07 MED ORDER — SODIUM CHLORIDE 0.9 % IV SOLN
1.0000 g | Freq: Once | INTRAVENOUS | Status: AC
  Administered 2017-11-07: 1 g via INTRAVENOUS
  Filled 2017-11-07: qty 1

## 2017-11-07 NOTE — Progress Notes (Signed)
Inpatient Diabetes Program Recommendations  AACE/ADA: New Consensus Statement on Inpatient Glycemic Control (2015)  Target Ranges:  Prepandial:   less than 140 mg/dL      Peak postprandial:   less than 180 mg/dL (1-2 hours)      Critically ill patients:  140 - 180 mg/dL   Lab Results  Component Value Date   GLUCAP 78 11/07/2017   HGBA1C 5.4 07/20/2017    Review of Glycemic Control Results for CARLEAH, YABLONSKI (MRN [id number]) as of 11/07/2017 09:27  Ref. Range 11/06/2017 17:13 11/06/2017 21:30 11/07/2017 06:56 11/07/2017 07:17  Glucose-Capillary Latest Ref Range: 70 - 99 mg/dL 64 (L) 81 47 (L) 78   Diabetes history: Type 2 DM Outpatient Diabetes medications: Glyburide 1.25 mg BID Current orders for Inpatient glycemic control: Glyburide 1.25 mg BID  Inpatient Diabetes Program Recommendations:    Noted that patient experienced hypoglycemic episode of 47 mg/dL. Patient still received Glyburide following episode. Also, during peri-op on 9/1 patient received Decadron 4 mg X1. Noted patient was type 2 DM and may need close follow up in the postpartum period. Would recommend discontinuing Glyburide at discharge and still encouraging patient to check CBGs routinely until 6 week follow up.   Thanks, Bronson Curb, MSN, RNC-OB Diabetes Coordinator [phone] (8a-5p)

## 2017-11-07 NOTE — Progress Notes (Signed)
Blood sugar of 56. Juice and snacks given. Patient thinks its related to medication. Patient is alert and talkative.

## 2017-11-07 NOTE — Progress Notes (Signed)
POSTPARTUM PROGRESS NOTE  POD #2  Subjective:  [name] is a 32 y.o. [id number] s/p rLTCS & BTL at [redacted]w[redacted]d.  She reports she doing well. No acute events overnight. She reports she is doing well. She denies any problems with ambulating, voiding or po intake. Denies nausea or vomiting. She has passed flatus. Pain is not well controlled.  Lochia is appropriate.  Objective: Blood pressure 114/87, pulse 68, temperature 98 F (36.7 C), temperature source Oral, resp. rate 18, height 5' (1.524 m), weight 70 kg, last menstrual period 02/05/2017, SpO2 100 %, unknown if currently breastfeeding.  Physical Exam:  General: alert, cooperative and no distress Chest: no respiratory distress Heart:regular rate, distal pulses intact Abdomen: soft, nontender, TTP over periumbilical region Uterine Fundus: firm, diffuse tenderness over fundus  DVT Evaluation: No calf swelling or tenderness Extremities: No EL edema Skin: warm, dry; incision clean/dry/intact w/ honeycomb dressing in place  Recent Labs    11/06/17 0543 11/07/17 1510  HGB 9.7* 9.8*  HCT 28.7* 29.0*    Assessment/Plan: [name] is a 32 y.o. [id number] s/p rLTCS at [redacted]w[redacted]d for elective repeat.  POD#2 - Doing welll; pain not well controlled. Seen by Darrol Poke, CNM with significant uterine fundal tenderness. Patient has been afebrile. CBC ordered with appropriate WBC. Ancef 2g x1 given for intraabdominal infection prophylaxis. On re-examination, patient with improved tenderness but having discomfort in periumbilical region. Given history of pancreatitis, will obtain lipase.   Routine postpartum care  OOB, ambulated  Lovenox for VTE prophylaxis Contraception: S/p BTL Feeding: Breast  Dispo: Plan for discharge tomorrow.   LOS: 2 days   Phill Myron, D.O. OB Fellow  11/07/2017, 5:49 PM

## 2017-11-07 NOTE — Lactation Note (Signed)
This note was copied from a baby's chart. Lactation Consultation Note  Patient Name: [name] Today's Date: 11/07/2017   Parents report baby is breastfeeding well.  Plan to be discharged today.  Mom is an experienced breastfeeding mom.  Parents have BFSG and resource info for home use if needed.  Maternal Data    Feeding Feeding Type: Breast Fed Length of feed: 25 min  LATCH Score Latch: Grasps breast easily, tongue down, lips flanged, rhythmical sucking.(shallow but mother denies pain, relatched)  Audible Swallowing: Spontaneous and intermittent  Type of Nipple: Everted at rest and after stimulation  Comfort (Breast/Nipple): Soft / non-tender  Hold (Positioning): No assistance needed to correctly position infant at breast.  LATCH Score: 10  Interventions Interventions: Assisted with latch;Adjust position;Support pillows;Hand express  Lactation Tools Discussed/Used     Consult Status Consult Status: Complete Follow-up type: Call as needed    [facility] 11/07/2017, 4:22 PM

## 2017-11-07 NOTE — Lactation Note (Signed)
This note was copied from a baby's chart. Lactation Consultation Note  Patient Name: Girl Liannah Mensing Today's Date: 11/07/2017 Reason for consult: Mother's request P3, 77 hour female infant. Mom requesting lactation. Per mom, concern she saw small tinged of blood in baby mouth coming from her breast. LC notice mom had previous nipple damage on left breast and is now healing. LC explain it is not uncommon see blood in breast milk  as nipples are healing nor will it hurt baby.  Mom explain she is not having any pain and infant is latching better. LC reinforced  Infant should have wide mouth gape with tongue extended downward with chin first when latching to breast.   Mom has coconut oil and know not to use coconut oil with comfort gels. Mom has [place] # to call for any further questions or concerns. Mom made aware of O/P services, breastfeeding support groups, community resources, and our phone # for post-discharge questions.    Maternal Data    Feeding Feeding Type: Breast Fed Length of feed: 15 min  LATCH Score                   Interventions    Lactation Tools Discussed/Used     Consult Status      Vicente Serene 11/07/2017, 10:57 PM

## 2017-11-07 NOTE — Plan of Care (Signed)
Patient has uterine incisional pain and takes Percocet occasionally with moderate relief.

## 2017-11-08 ENCOUNTER — Telehealth: Payer: Self-pay | Admitting: Family Medicine

## 2017-11-08 ENCOUNTER — Other Ambulatory Visit: Payer: Self-pay | Admitting: Advanced Practice Midwife

## 2017-11-08 LAB — GLUCOSE, CAPILLARY: GLUCOSE-CAPILLARY: 76 mg/dL (ref 70–99)

## 2017-11-08 MED ORDER — OXYCODONE-ACETAMINOPHEN 5-325 MG PO TABS: 1.0000 | ORAL_TABLET | ORAL | 0 refills | Status: DC | PRN

## 2017-11-08 NOTE — Telephone Encounter (Signed)
Patient called very upset this morning about a Rx (percocet). Spoke with Morey Hummingbird RN, and then called patient back to make sure she had gotten what she needed. Patient was very thankful.

## 2017-11-08 NOTE — Progress Notes (Addendum)
Inpatient Diabetes Program Recommendations  AACE/ADA: New Consensus Statement on Inpatient Glycemic Control (2015)  Target Ranges:  Prepandial:   less than 140 mg/dL      Peak postprandial:   less than 180 mg/dL (1-2 hours)      Critically ill patients:  140 - 180 mg/dL   Lab Results  Component Value Date   GLUCAP 76 11/08/2017   HGBA1C 5.4 07/20/2017    Review of Glycemic Control Results for [name], [name] (MRN [id number]) as of 11/08/2017 09:18  Ref. Range 11/07/2017 10:08 11/07/2017 11:38 11/07/2017 15:13 11/07/2017 20:04 11/08/2017 06:03  Glucose-Capillary Latest Ref Range: 70 - 99 mg/dL 56 (L) 98 86 74 76   Diabetes history: Type 2 DM Outpatient Diabetes medications: Glyburide 1.25 mg BID Current orders for Inpatient glycemic control: Glyburide 1.25 mg BID  Inpatient Diabetes Program Recommendations:    Noted that patient experienced hypoglycemic episode of 47 mg/dL and subsequently 56 mg/dL following a second dose on 9/3. Patient feels this was related to the Glyburide. Also, during peri-op on 9/1 patient received Decadron 4 mg X1. Noted patient was type 2 DM and may need close follow up in the postpartum period. Would recommend discontinuing Glyburide at discharge and still encouraging patient to check CBGs routinely until 6 week follow up.   Addendum@ 0930: Spoke with the RN regarding order for glyburide. Patient having lows. Per RN, patient is savvy and plans to follow up with her endo; as she refused her PM dose on 11/07/17. RN to contact MD regarding discharge education and orders and develop a plan.   Thanks, Bronson Curb, MSN, RNC-OB Diabetes Coordinator [phone] (8a-5p)

## 2017-11-08 NOTE — Discharge Summary (Addendum)
OB Discharge Summary     Patient Name: [name] DOB: 09-26-1985 MRN: [id number]  Date of admission: 11/05/2017 Delivering MD: Woodroe Mode   Date of discharge: 11/08/2017  Admitting diagnosis: RCS Intrauterine pregnancy: [redacted]w[redacted]d     Secondary diagnosis:  Active Problems:   Supervision of other high risk pregnancy, antepartum   History of cesarean delivery   Request for sterilization   Postpartum state  Additional problems: none     Discharge diagnosis: Term Pregnancy Delivered, Type 2 DM and Anemia                                                                                                Post partum procedures:none  Augmentation: none  Complications: None  Hospital course:  Sceduled C/S   31 y.o. yo [id number] at [redacted]w[redacted]d was admitted to the hospital 11/05/2017 for scheduled cesarean section with the following indication:Elective Repeat.  Membrane Rupture Time/Date: 1:58 PM ,11/05/2017   Patient delivered a Viable infant.11/05/2017  Details of operation can be found in separate operative note.  Pateint had an uncomplicated postpartum course.  She is ambulating, tolerating a regular diet, passing flatus, and urinating well. Patient is discharged home in stable condition on  11/08/17         Physical exam  Vitals:   11/07/17 1500 11/07/17 1549 11/07/17 2150 11/08/17 0542  BP: 114/87 114/87 108/81 121/77  Pulse: 68 68 64 62  Resp: 18  18 20   Temp: 98 F (36.7 C) 98 F (36.7 C) 98.1 F (36.7 C) 98 F (36.7 C)  TempSrc: Oral Oral Oral Oral  SpO2:   98%   Weight:      Height:       General: alert, cooperative and no distress Lochia: appropriate Uterine Fundus: firm Incision: Healing well with no significant drainage DVT Evaluation: No evidence of DVT seen on physical exam. Labs: Lab Results  Component Value Date   WBC 10.5 11/07/2017   HGB 9.8 (L) 11/07/2017   HCT 29.0 (L) 11/07/2017   MCV 69.7 (L) 11/07/2017   PLT 378 11/07/2017   CMP Latest Ref Rng &  Units 07/20/2017  Glucose 65 - 99 mg/dL 68  BUN 6 - 20 mg/dL 6  Creatinine 0.57 - 1.00 mg/dL 0.53(L)  Sodium 134 - 144 mmol/L 137  Potassium 3.5 - 5.2 mmol/L 3.9  Chloride 96 - 106 mmol/L 105  CO2 20 - 29 mmol/L 20  Calcium 8.7 - 10.2 mg/dL 9.0  Total Protein 6.0 - 8.5 g/dL 6.3  Total Bilirubin 0.0 - 1.2 mg/dL <0.2  Alkaline Phos 39 - 117 IU/L 59  AST 0 - 40 IU/L 11  ALT 0 - 32 IU/L 7    Discharge instruction: per After Visit Summary and "Baby and Me Booklet".  After visit meds:  Allergies as of 11/08/2017      Reactions   Dilaudid [hydromorphone Hcl] Itching, Other (See Comments)   Reaction:  Hallucinations      Medication List    STOP taking these medications   ACCU-CHEK FASTCLIX LANCETS Misc   folic acid 1 MG  tablet Commonly known as:  FOLVITE   glucose blood test strip   glyBURIDE 2.5 MG tablet Commonly known as:  DIABETA   oxyCODONE 5 MG immediate release tablet Commonly known as:  Oxy IR/ROXICODONE     TAKE these medications   acetaminophen 500 MG tablet Commonly known as:  TYLENOL Take 500-1,000 mg by mouth every 6 (six) hours as needed for mild pain, moderate pain or headache.   aspirin EC 81 MG tablet Take 1 tablet (81 mg total) by mouth daily. Take after 12 weeks for prevention of preeclampsia later in pregnancy   calcium carbonate 500 MG chewable tablet Commonly known as:  TUMS - dosed in mg elemental calcium Chew 2 tablets by mouth 2 (two) times daily as needed for indigestion or heartburn.   oxyCODONE-acetaminophen 5-325 MG tablet Commonly known as:  PERCOCET/ROXICET Take 1-2 tablets by mouth every 4 (four) hours as needed for severe pain.   polyethylene glycol powder powder Commonly known as:  GLYCOLAX/MIRALAX Take 17 g by mouth daily. What changed:    when to take this  reasons to take this   PRENATAL VITAMIN PO Take 1 tablet by mouth at bedtime.       Diet: routine diet  Activity: Advance as tolerated. Pelvic rest for 6 weeks.    Outpatient follow up:3 weeks Follow up Appt: Future Appointments  Date Time Provider [place]  11/28/2017 10:00 AM [facility]  12/11/2017  9:15 AM Woodroe Mode, MD [facility]   Follow up Visit:No follow-ups on file.  Postpartum contraception: Tubal Ligation    Newborn Data: Live born female  Birth Weight: 7 lb 0.5 oz (3190 g) APGAR: 29, 8  Newborn Delivery   Birth date/time:  11/05/2017 14:00:00 Delivery type:  C-Section, Low Transverse Trial of labor:  No C-section categorization:  Repeat     Baby Feeding: Breast Disposition:home with mother   11/08/2017 Matilde Haymaker, MD   OB FELLOW DISCHARGE ATTESTATION  I have seen and examined this patient and agree with above documentation in the resident's note.   Patient with h/o T2DM. Was on Metformin 500 mg BID prior to pregnancy; was then switched to Glyburide. CBGs have been well controlled PP. Have elected to d/c Glyburide due to hypoglycemia risk. Patient with most recent A1c of 5.4%. Has never been above 5.7% per EMR review. Will hold Metformin at d/c. Recommend repeat A1c PP and discussion with PCP about need for diabetic medications.   Phill Myron, D.O. OB Fellow  11/08/2017, 10:52 AM

## 2017-11-08 NOTE — Discharge Instructions (Signed)

## 2017-11-08 NOTE — Progress Notes (Signed)
Patient called clinic to report pharmacy did not receive original prescription. Reordered under my [place], CNM 11/08/17 2:09 PM

## 2017-11-14 ENCOUNTER — Encounter: Payer: Medicaid Other | Admitting: Obstetrics and Gynecology

## 2017-11-14 ENCOUNTER — Other Ambulatory Visit: Payer: Medicaid Other

## 2017-11-17 ENCOUNTER — Telehealth: Payer: Self-pay | Admitting: *Deleted

## 2017-11-17 NOTE — Telephone Encounter (Signed)
Called pt to discuss her MyChart message from yesterday concerning report of pain in hips and pelvic bone. Pt states the pain is no different than yesterday. This is her 3rd C/S and she has not had this type of pain previously. She also reports pain @ umbilicus. She is taking ibuprofen and tylenol for the discomfort and is not requesting narcotic medications. She is just concerned that this is not normal. I advised that her experience following each C/S can be different. I offered nurse visit today for evaluation. Pt states she does not have a ride this afternoon and also is not available on Monday 9/16 due to other child has a medical appointment. Pt agreed to nurse visit on 9/17 @ 0935. She will notify our office if her condition improves and nurse appt is not necessary.

## 2017-11-21 ENCOUNTER — Ambulatory Visit: Payer: Medicaid Other

## 2017-11-28 ENCOUNTER — Ambulatory Visit (INDEPENDENT_AMBULATORY_CARE_PROVIDER_SITE_OTHER): Payer: Medicaid Other | Admitting: General Practice

## 2017-11-28 ENCOUNTER — Telehealth: Payer: Self-pay | Admitting: *Deleted

## 2017-11-28 ENCOUNTER — Other Ambulatory Visit: Payer: Self-pay | Admitting: *Deleted

## 2017-11-28 ENCOUNTER — Other Ambulatory Visit (INDEPENDENT_AMBULATORY_CARE_PROVIDER_SITE_OTHER): Payer: Medicaid Other

## 2017-11-28 VITALS — BP 127/88 | HR 72 | Ht 60.0 in | Wt 137.0 lb

## 2017-11-28 DIAGNOSIS — Z0130 Encounter for examination of blood pressure without abnormal findings: Secondary | ICD-10-CM

## 2017-11-28 DIAGNOSIS — Z8719 Personal history of other diseases of the digestive system: Secondary | ICD-10-CM

## 2017-11-28 DIAGNOSIS — Z5189 Encounter for other specified aftercare: Secondary | ICD-10-CM

## 2017-11-28 LAB — HEPATIC FUNCTION PANEL
ALBUMIN: 4.1 g/dL (ref 3.5–5.2)
ALT: 12 U/L (ref 0–35)
AST: 12 U/L (ref 0–37)
Alkaline Phosphatase: 90 U/L (ref 39–117)
BILIRUBIN TOTAL: 0.4 mg/dL (ref 0.2–1.2)
Bilirubin, Direct: 0.1 mg/dL (ref 0.0–0.3)
Total Protein: 7.7 g/dL (ref 6.0–8.3)

## 2017-11-28 LAB — AMYLASE: AMYLASE: 27 U/L (ref 27–131)

## 2017-11-28 LAB — LIPASE: LIPASE: 6 U/L — AB (ref 11.0–59.0)

## 2017-11-28 NOTE — Telephone Encounter (Signed)
[facility] , [facility], called and said patient is here and told to have labs drawn. Per Amy Esterwood PA-C , I put orders in for Amylayse, Lipase and Hepatic panel for history of Pancreatitis.

## 2017-11-28 NOTE — Progress Notes (Addendum)
Patient presents to the office today for wound & BP check. Patient delivered 9/1 by repeat c-section. Patient reports headaches and "seeing spots" since she went home from the hospital. Patient states motrin or tylenol helps her headaches. Patient thinks this may be due to lack of sleep. Patient was not on BP medication during pregnancy & hasn't been on any thus far in the postpartum period. Incision is clean, dry & intact. Patient is tender from navel to incision with more tenderness on right side. Patient's abdomen is soft, no erythema noted. Reassured patient tenderness will improve with each week and is more prominent given this is her third c-section and she's had a BTL. Advised patient to contact us if incision becomes infected or if her headaches worsen. Patient will follow up on 10/7 for pp visit.   I have reviewed the note and chart, and I agree with the management and plan.  Earlie Server, RN, MSN, NP-BC Nurse Practitioner, [facility] for [employer], [place] [facility] 11/28/2017 12:30 PM

## 2017-11-29 ENCOUNTER — Encounter: Payer: Self-pay | Admitting: Family Medicine

## 2017-11-29 ENCOUNTER — Ambulatory Visit (INDEPENDENT_AMBULATORY_CARE_PROVIDER_SITE_OTHER): Payer: Medicaid Other | Admitting: Family Medicine

## 2017-11-29 VITALS — BP 121/83 | HR 71 | Wt 137.0 lb

## 2017-11-29 DIAGNOSIS — N644 Mastodynia: Secondary | ICD-10-CM

## 2017-11-29 DIAGNOSIS — I739 Peripheral vascular disease, unspecified: Secondary | ICD-10-CM

## 2017-11-29 MED ORDER — CLOTRIMAZOLE 1 % EX CREA: 1.0000 "application " | TOPICAL_CREAM | Freq: Two times a day (BID) | CUTANEOUS | 1 refills | Status: AC

## 2017-11-29 MED ORDER — NIFEDIPINE ER OSMOTIC RELEASE 30 MG PO TB24: 30.0000 mg | ORAL_TABLET | Freq: Every day | ORAL | 3 refills | Status: DC

## 2017-11-29 NOTE — Progress Notes (Signed)
Postpartum OFFICE VISIT NOTE  History:  32 y.o. [id number] here today for breast pain.   - rough C/S - lots of pushing to get baby out, made pancreatitis worse, doesn't think numbed well enough, upset with the way incision looks  - had tubes clamped - engorged right breast, pumping but pain underneath breast when latches - more pain over weekend on right side - pain every time milk let down, never had problem with breastfeeding - infant tongue white at back - home nurse didn't think it looked like thrush - does formula because of breast pain - not scarping away as easily - worried about tendonitis in arm from holding her - two small children to care for  - breastfeeding every 2 hours - dad wants her to breastfeed  - only way to keep Medicaid is to put father on child support - can't go see PCP because only pregnancy Medicaid - 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   Review of Systems:  Pertinent items noted in HPI ROS  Objective:  Physical Exam BP 121/83   Pulse 71   Wt 137 lb (62.1 kg)   BMI 26.76 kg/m  Physical Exam  Constitutional: She is oriented to person, place, and time. She appears well-developed and well-nourished. No distress.  Appears anxious  HENT:  Head: Normocephalic and atraumatic.  Mouth/Throat: No oropharyngeal exudate.  Eyes: Conjunctivae and EOM are normal.  Pulmonary/Chest:  Normal appearing breasts, leaking milk, no abnormal nipple lesions/sore, no breast erythema, no signs of  engorgement, tender on inferior aspect of right breast  Abdominal:  C/S incision well-approximated with evidence of normal healing  Neurological: She is alert and oriented to person, place, and time.  Skin: Skin is warm and dry.  Psychiatric: She has a normal mood and affect. Her behavior is normal.  Nursing note and vitals reviewed.  Labs and Imaging Results for orders placed or performed in visit on 11/28/17 (from the past 168 hour(s))  Hepatic function panel   Collection Time: 11/28/17 12:02 PM  Result Value Ref Range   Total Bilirubin 0.4 0.2 - 1.2 mg/dL   Bilirubin, Direct 0.1 0.0 - 0.3 mg/dL   Alkaline Phosphatase 90 39 - 117 U/L   AST 12 0 - 37 U/L   ALT 12 0 - 35 U/L   Total Protein 7.7 6.0 - 8.3 g/dL   Albumin 4.1 3.5 - 5.2 g/dL  Amylase   Collection Time: 11/28/17 12:02 PM  Result Value Ref Range   Amylase 27 27 - 131 U/L  Lipase   Collection Time: 11/28/17 12:02 PM  Result Value Ref Range   Lipase 6.0 (L) 11.0 - 59.0 U/L   US Fetal Bpp W/nonstress  Result Date: 11/10/2017 ----------------------------------------------------------------------  OBSTETRICS REPORT                        (Signed Final 11/10/2017 02:27 am) ---------------------------------------------------------------------- Patient Info  ID #:       [id number]  D.O.B.:  [birth year] (32 yrs)  Name:       [name]             Visit Date: 10/31/2017 10:01 am ---------------------------------------------------------------------- Performed By  Performed By:     Shauna Hugh Day RNC          Ref. Address:      [facility]                                                              [place]                                                              [place], [place]  Attending:         Wallie Renshaw-      Location:          Center for                    [facility] MD                                  [facility]  Referred By:      [facility] for                    [place] ---------------------------------------------------------------------- Orders   #  Description                          Code         Ordered By   1  US FETAL BPP W/NONSTRESS             36629.4      Lavonia Drafts  ----------------------------------------------------------------------   #  Order #                    Accession #                 Episode #   1  [id number]                  [id number]                  [id number]  ---------------------------------------------------------------------- Service(s) Provided   US Fetal BPP W NST                                    [phone]  ---------------------------------------------------------------------- Indications   [redacted] weeks gestation of pregnancy                Z3A.38   Unspecified pre-existing hypertension          F63.846   complicating pregnancy, third trimester  ---------------------------------------------------------------------- Fetal Evaluation  Num Of Fetuses:          1  Preg. Location:          Intrauterine  Cardiac Activity:        Observed  Presentation:            Cephalic  Amniotic Fluid  AFI FV:      Within normal limits  AFI Sum(cm)     %Tile       Largest Pocket(cm)  21.4            86          6.7  RUQ(cm)       RLQ(cm)       LUQ(cm)        LLQ(cm)  4.7           5.83          6.7            4.17 ---------------------------------------------------------------------- Biophysical Evaluation  Amniotic F.V:   Pocket => 2 cm two         F. Tone:         Observed                  planes  F. Movement:    Observed                   N.S.T:           Reactive  F. Breathing:   Not  Observed               Score:           8/10 ---------------------------------------------------------------------- OB History  Gravidity:  7         Term:   2         SAB:   3  TOP:          1        Living:  2 ---------------------------------------------------------------------- Gestational Age  LMP:           38w 2d        Date:  02/05/17                 EDD:   11/12/17  Best:          38w 2d     Det. By:  LMP  (02/05/17)          EDD:   11/12/17 ---------------------------------------------------------------------- Impression  Antenatal testing is reassuring. BPP 8/10. Normal amniotic  fluid volume. ---------------------------------------------------------------------- Recommendations  Continue weekly antenatal testing till delivery. ----------------------------------------------------------------------               Verita Schneiders, MD Electronically Signed Final Report   11/10/2017 02:27 am ----------------------------------------------------------------------   Assessment & Plan:  89GE [id number] who is about a month postpartum from cesarean section delivery who presents today with breast pain. DDX includes possible thrush or Raynauds exacerbated by pain from C/S recovery and chronic pancreatitis. Recommended trial of topical antifungal cream and nifedipine. Has scheduled postpartum follow-up in about 2 weeks. Also counseled on pros/cons of breastfeeding especially in context of history of postpartum depression and feeling overwhelmed and in pain from chronic pancreatitis. Reassurance provided. Reviewed return precautions and patient voiced understanding.   Total face-to-face time with patient: 30 minutes.  Over 50% of encounter was spent on counseling and coordination of care.  Lambert Mody. Juleen China, DO OB Family Medicine Fellow, [facility] for [employer], [place]

## 2017-11-30 ENCOUNTER — Ambulatory Visit (INDEPENDENT_AMBULATORY_CARE_PROVIDER_SITE_OTHER): Payer: Medicaid Other | Admitting: Physician Assistant

## 2017-11-30 ENCOUNTER — Encounter: Payer: Self-pay | Admitting: Physician Assistant

## 2017-11-30 VITALS — BP 132/80 | HR 73 | Ht 60.0 in | Wt 140.0 lb

## 2017-11-30 DIAGNOSIS — R1011 Right upper quadrant pain: Secondary | ICD-10-CM | POA: Diagnosis not present

## 2017-11-30 DIAGNOSIS — D490 Neoplasm of unspecified behavior of digestive system: Secondary | ICD-10-CM | POA: Diagnosis not present

## 2017-11-30 DIAGNOSIS — G8929 Other chronic pain: Secondary | ICD-10-CM | POA: Diagnosis not present

## 2017-11-30 DIAGNOSIS — Z90411 Acquired partial absence of pancreas: Secondary | ICD-10-CM

## 2017-11-30 MED ORDER — OXYCODONE-ACETAMINOPHEN 5-325 MG PO TABS: ORAL_TABLET | ORAL | 0 refills | Status: DC

## 2017-11-30 NOTE — Progress Notes (Addendum)
Subjective:    Patient ID: [name], female    DOB: 07-05-1985, 32 y.o.   MRN: [id number]  HPI [name] is a pleasant 32 year old African-American female, known to Dr. Hilarie Fredrickson and myself.  She comes back in today for follow-up of pancreatic issues after delivering her baby with C-section on 11/05/2017.    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 Prior MRCP for complaints of upper abdominal pain was unremarkable and EUS in 2017 per Ardis Hughs with no evidence of chronic pancreatitis of the remaining pancreas and gallbladder and common bile duct were normal. She is been on Creon 2 p.o. with each meal which she did stop during her pregnancy and has since resumed. She says that there was some difficulty with her recent C-section and that they had some trouble getting the baby out and put an extreme amount of abdominal pressure on her upper abdomen which she believes aggravated her pancreas.  Says she was okay for the first day or 2 after the C-section but then after going home continued to have tenderness in the upper abdomen which she describes as her pancreas pain which is a boring gnawing type pain there is present in the epigastrium and radiates through to her back.  She has not had any nausea or vomiting.  Over the past 3 weeks she has had some days of increased abdominal pain with very little p.o. intake followed by several days where she is feeling pretty well.  This last Saturday night she had a bout of urgent oily diarrhea, then felt worse with upper abdominal discomfort the next 2 days now this week has been feeling okay.  She is breast-feeding. We  did labs prior to this office visit, Hepatic panel was normal, lipase of 6 and amylase  27.  Review of Systems Pertinent positive and negative review of systems were noted in the above HPI section.  All other review of systems was otherwise negative.  Outpatient Encounter Medications as of 11/30/2017  Medication Sig  . acetaminophen (TYLENOL) 500 MG tablet Take 500-1,000 mg by mouth every 6 (six) hours as needed for mild pain, moderate pain or headache.   . calcium carbonate (TUMS - DOSED IN MG ELEMENTAL CALCIUM) 500 MG chewable tablet Chew 2 tablets by mouth 2 (two) times daily as needed for indigestion or heartburn.  . clotrimazole (LOTRIMIN) 1 % cream Apply 1 application topically 2 (two) times daily for 10 days.  Marland Kitchen glyBURIDE (DIABETA) 2.5 MG tablet TAKE 1/2 TABLET BY MOUTH TWICE A DAY WITH A MEAL  . NIFEdipine (PROCARDIA-XL/ADALAT-CC/NIFEDICAL-XL) 30 MG 24 hr tablet Take 1 tablet (30 mg total) by mouth daily.  Marland Kitchen oxyCODONE-acetaminophen (PERCOCET/ROXICET) 5-325 MG tablet Take 1 tablet by mouth every 6 hours as needed for pain.  . Prenatal Vit-Fe Fumarate-FA (PRENATAL VITAMIN PO) Take 1 tablet by mouth at bedtime.   . [DISCONTINUED] oxyCODONE-acetaminophen (PERCOCET/ROXICET) 5-325 MG tablet Take 1-2 tablets by mouth every 4 (four) hours as needed for severe pain.  . [DISCONTINUED] aspirin EC 81 MG tablet Take 1 tablet (81 mg total) by mouth daily. Take after 12 weeks for prevention of preeclampsia later in pregnancy (Patient not taking: Reported on 11/29/2017)  . [DISCONTINUED] polyethylene glycol powder (GLYCOLAX/MIRALAX) powder Take 17 g by  mouth daily. (Patient not taking: Reported on 11/29/2017)   No facility-administered encounter medications on file as of 11/30/2017.    Allergies  Allergen Reactions  . Dilaudid [Hydromorphone Hcl] Itching and Other (See Comments)    Reaction:  Hallucinations   Patient Active Problem List   Diagnosis Date Noted  . Postpartum state 11/05/2017  . GBS (group B Streptococcus carrier), +RV culture, currently pregnant 10/23/2017  . Request for  sterilization 07/20/2017  . Fibroid 05/08/2017  . Pancreatitis 05/08/2017  . History of postpartum severe pre-eclampsia 04/10/2017  . Supervision of other high risk pregnancy, antepartum 04/10/2017  . History of cesarean delivery 04/10/2017  . Chronic hypertension during pregnancy, antepartum 08/13/2014  . 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 Vitamin D deficiency 12/29/2013  . Asthma 12/25/2013  . Genital warts 12/22/2011  . Pancreatic pseudocyst 06/24/2010   Social History   Socioeconomic History  . Marital status: Single    Spouse name: Not on file  . Number of children: 2  . Years of education: Not on file  . Highest education level: Not on file  Occupational History  . Occupation: Agricultural engineer  Social Needs  . Financial resource strain: Not hard at all  . Food insecurity:    Worry: Never true    Inability: Never true  . Transportation needs:    Medical: No    Non-medical: No  Tobacco Use  . Smoking status: Current Every Day Smoker    Packs/day: 0.25    Years: 13.00    Pack years: 3.25    Types: Cigarettes  . Smokeless tobacco: Never Used  . Tobacco comment: smokes 4 cigs/day, tobacco info given 04/17/15  Substance and Sexual Activity  . Alcohol use: No    Alcohol/week: 0.0 standard drinks    Comment: social   . Drug use: No  . Sexual activity: Not Currently    Partners: Male    Birth control/protection: None  Lifestyle  . Physical activity:    Days per week: Not on file    Minutes per session: Not on file  . Stress: Only a little  Relationships  . Social connections:    Talks on phone: Not on file    Gets together: Not on file    Attends religious service: Not on file    Active member of club or organization: Not on file    Attends meetings of clubs or organizations: Not on file    Relationship status: Not on file  . Intimate partner violence:    Fear of current or ex partner: No    Emotionally abused: No    Physically abused: No      Forced sexual activity: No  Other Topics Concern  . Not on file  Social History Narrative  . Not on file    [name]'s family history includes Asthma in her father; Diabetes in her mother; Hypertension in her mother.      Objective:    Vitals:   11/30/17 1028  BP: 132/80  Pulse: 73    Physical Exam; well-developed young African-American female in no acute distress, very pleasant blood pressure 132/80 pulse 73, height 5 foot, weight 140, BMI 27.3.  HEENT; nontraumatic normocephalic EOMI PERRLA sclera anicteric oropharynx benign, Cardiovascular; regular rate and rhythm with S1-S2, Pulmonary; clear bilaterally, Abdomen ;soft, subjectively has mild tenderness across the upper abdomen there is no guarding or rebound no palpable mass or hepatosplenomegaly bowel sounds are present midline incisional scar  and also recent C-section low transverse scar.  Rectal; exam not done, Extremities ;no clubbing cyanosis or edema skin warm dry, Neuro psych; alert and oriented, grossly nonfocal mood and affect appropriate       Assessment & Plan:   #75  32 year old African-American female status post distal pancreatectomy for mucinous cystic neoplasm of the tail of the pancreas 2012 with recurrent episodes of epigastric pain. Patient just delivered her third child by C-section on 11/05/2017, she has some intermittent pain during pregnancy had increase in symptoms after a difficult C-section.  Enzymes do not reflect acute pancreatitis. Rule out interval development of chronic pancreatitis, rule out pancreatic duct stricture, recurrent neoplasm  #2  acquired diabetes  Plan;; continue Creon 2 p.o. [facility] Patient requested a refill on Percocet 5/325 she uses sparingly.  #30 no refills Will schedule for MRI/MRCP Plans and follow-up pending results of above. Plan  Yandel Zeiner Genia Harold PA-C 11/30/2017   Cc: Nolene Ebbs, MD   Addendum: Reviewed and agree with management. Pyrtle, Lajuan Lines, MD

## 2017-11-30 NOTE — Patient Instructions (Signed)
You have been scheduled for an MRI at [place]  building on Tuesday 12-19-2017 Your appointment time is 9:00 AM. Please arrive at 8:45 am to your appointment time for registration purposes. Please make certain not to have anything to eat or drink 4 hours prior to your test. In addition, if you have any metal in your body, have a pacemaker or defibrillator, please be sure to let your ordering physician know. This test typically takes 45 minutes to 1 hour to complete. Should you need to reschedule, please call [phone] to do so.  Continue Creon- 2 tablets before each meal. Continue a low fat diet.   We  Have given you a prescription for Percocet 5/325 mg. Take it to your pharmacy.

## 2017-12-05 ENCOUNTER — Ambulatory Visit: Payer: Medicaid Other | Admitting: Obstetrics and Gynecology

## 2017-12-05 ENCOUNTER — Inpatient Hospital Stay (HOSPITAL_COMMUNITY)
Admission: AD | Admit: 2017-12-05 | Discharge: 2017-12-05 | Disposition: A | Discharge status: Home / Self Care | Payer: Medicaid Other | Source: Ambulatory Visit | Attending: Family Medicine | Admitting: Family Medicine

## 2017-12-05 ENCOUNTER — Other Ambulatory Visit: Payer: Self-pay

## 2017-12-05 ENCOUNTER — Encounter (HOSPITAL_COMMUNITY): Payer: Self-pay | Admitting: *Deleted

## 2017-12-05 VITALS — BP 140/102 | HR 71 | Ht 60.0 in | Wt 138.0 lb

## 2017-12-05 DIAGNOSIS — R030 Elevated blood-pressure reading, without diagnosis of hypertension: Secondary | ICD-10-CM | POA: Diagnosis present

## 2017-12-05 DIAGNOSIS — O165 Unspecified maternal hypertension, complicating the puerperium: Secondary | ICD-10-CM | POA: Diagnosis not present

## 2017-12-05 DIAGNOSIS — Z0130 Encounter for examination of blood pressure without abnormal findings: Secondary | ICD-10-CM

## 2017-12-05 LAB — COMPREHENSIVE METABOLIC PANEL
ALBUMIN: 4.1 g/dL (ref 3.5–5.0)
ALT: 14 U/L (ref 0–44)
AST: 17 U/L (ref 15–41)
Alkaline Phosphatase: 90 U/L (ref 38–126)
Anion gap: 8 (ref 5–15)
BUN: 9 mg/dL (ref 6–20)
CHLORIDE: 105 mmol/L (ref 98–111)
CO2: 25 mmol/L (ref 22–32)
CREATININE: 0.62 mg/dL (ref 0.44–1.00)
Calcium: 9 mg/dL (ref 8.9–10.3)
GFR calc Af Amer: >60 mL/min (ref >60)
GFR calc non Af Amer: >60 mL/min (ref >60)
GLUCOSE: 93 mg/dL (ref 70–99)
Potassium: 4.2 mmol/L (ref 3.5–5.1)
Sodium: 138 mmol/L (ref 135–145)
Total Bilirubin: 0.7 mg/dL (ref 0.3–1.2)
Total Protein: 8.2 g/dL — ABNORMAL HIGH (ref 6.5–8.1)

## 2017-12-05 LAB — CBC
HCT: 38.6 % (ref 36.0–46.0)
Hemoglobin: 13.1 g/dL (ref 12.0–15.0)
MCH: 23.1 pg — ABNORMAL LOW (ref 26.0–34.0)
MCHC: 33.9 g/dL (ref 30.0–36.0)
MCV: 68.1 fL — ABNORMAL LOW (ref 78.0–100.0)
Platelets: 595 10*3/uL — ABNORMAL HIGH (ref 150–400)
RBC: 5.67 MIL/uL — ABNORMAL HIGH (ref 3.87–5.11)
RDW: 15.9 % — AB (ref 11.5–15.5)
WBC: 5 10*3/uL (ref 4.0–10.5)

## 2017-12-05 LAB — PROTEIN / CREATININE RATIO, URINE
CREATININE, URINE: 74 mg/dL
Total Protein, Urine: <6 mg/dL

## 2017-12-05 LAB — GLUCOSE, CAPILLARY: GLUCOSE-CAPILLARY: 147 mg/dL — AB (ref 70–99)

## 2017-12-05 MED ORDER — CYCLOBENZAPRINE HCL 10 MG PO TABS
10.0000 mg | ORAL_TABLET | Freq: Three times a day (TID) | ORAL | Status: DC | PRN
  Administered 2017-12-05: 10 mg via ORAL
  Filled 2017-12-05: qty 1

## 2017-12-05 MED ORDER — BUTALBITAL-APAP-CAFFEINE 50-325-40 MG PO TABS
1.0000 | ORAL_TABLET | Freq: Once | ORAL | Status: AC
  Administered 2017-12-05: 1 via ORAL
  Filled 2017-12-05: qty 1

## 2017-12-05 MED ORDER — AMLODIPINE BESYLATE 10 MG PO TABS: 10.0000 mg | ORAL_TABLET | Freq: Every day | ORAL | Status: DC

## 2017-12-05 NOTE — Discharge Instructions (Signed)
Postpartum Hypertension °Postpartum hypertension is high blood pressure after pregnancy that remains higher than normal for more than two days after delivery. You may not realize that you have postpartum hypertension if your blood pressure is not being checked regularly. In some cases, postpartum hypertension will go away on its own, usually within a week of delivery. However, for some women, medical treatment is required to prevent serious complications, such as seizures or stroke. °The following things can affect your blood pressure: °· The type of delivery you had. °· Having received IV fluids or other medicines during or after delivery. ° °What are the causes? °Postpartum hypertension may be caused by any of the following or by a combination of any of the following: °· Hypertension that existed before pregnancy (chronic hypertension). °· Gestational hypertension. °· Preeclampsia or eclampsia. °· Receiving a lot of fluid through an IV during or after delivery. °· Medicines. °· HELLP syndrome. °· Hyperthyroidism. °· Stroke. °· Other rare neurological or blood disorders. ° °In some cases, the cause may not be known. °What increases the risk? °Postpartum hypertension can be related to one or more risk factors, such as: °· Chronic hypertension. In some cases, this may not have been diagnosed before pregnancy. °· Obesity. °· Type 2 diabetes. °· Kidney disease. °· Family history of preeclampsia. °· Other medical conditions that cause hormonal imbalances. ° °What are the signs or symptoms? °As with all types of hypertension, postpartum hypertension may not have any symptoms. Depending on how high your blood pressure is, you may experience: °· Headaches. These may be mild, moderate, or severe. They may also be steady, constant, or sudden in onset (thunderclap headache). °· Visual changes. °· Dizziness. °· Shortness of breath. °· Swelling of your hands, feet, lower legs, or face. In some cases, you may have swelling in  more than one of these locations. °· Heart palpitations or a racing heartbeat. °· Difficulty breathing while lying down. °· Decreased urination. ° °Other rare signs and symptoms may include: °· Sweating more than usual. This lasts longer than a few days after delivery. °· Chest pain. °· Sudden dizziness when you get up from sitting or lying down. °· Seizures. °· Nausea or vomiting. °· Abdominal pain. ° °How is this diagnosed? °The diagnosis of postpartum hypertension is made through a combination of physical examination findings and testing of your blood and urine. You may also have additional tests, such as a CT scan or an MRI, to check for other complications of postpartum hypertension. °How is this treated? °When blood pressure is high enough to require treatment, your options may include: °· Medicines to reduce blood pressure (antihypertensives). Tell your health care provider if you are breastfeeding or if you plan to breastfeed. There are many antihypertensive medicines that are safe to take while breastfeeding. °· Stopping medicines that may be causing hypertension. °· Treating medical conditions that are causing hypertension. °· Treating the complications of hypertension, such as seizures, stroke, or kidney problems. ° °Your health care provider will also continue to monitor your blood pressure closely and repeatedly until it is within a safe range for you. °Follow these instructions at home: °· Take medicines only as directed by your health care provider. °· Get regular exercise after your health care provider tells you that it is safe. °· Follow your health care provider’s recommendations on fluid and salt restrictions. °· Do not use any tobacco products, including cigarettes, chewing tobacco, or electronic cigarettes. If you need help quitting, ask your health care provider. °·   Keep all follow-up visits as directed by your health care provider. This is important. °Contact a health care provider  if: °· Your symptoms get worse. °· You have new symptoms, such as: °? Headache. °? Dizziness. °? Visual changes. °Get help right away if: °· You develop a severe or sudden headache. °· You have seizures. °· You develop numbness or weakness on one side of your body. °· You have difficulty thinking, speaking, or swallowing. °· You develop severe abdominal pain. °· You develop difficulty breathing, chest pain, a racing heartbeat, or heart palpitations. °These symptoms may represent a serious problem that is an emergency. Do not wait to see if the symptoms will go away. Get medical help right away. Call your local emergency services (911 in the [place] Do not drive yourself to the hospital. °This information is not intended to replace advice given to you by your health care provider. Make sure you discuss any questions you have with your health care provider. °Document Released: 10/25/2013 Document Revised: 07/27/2015 Document Reviewed: 09/05/2013 °Elsevier Interactive Patient Education © 2018 [employer] Inc[employer] ° °

## 2017-12-05 NOTE — Progress Notes (Signed)
Patient presents to office today for blood pressure check as she has had several elevated readings at home. Patient reports headaches, blurry vision, & seeing some spots which all started on Saturday. Patient's BP readings at home have been 142/102, 138/98, 155/103, 133/99, & 144/102. Patient reports history of pre-eclampsia in the postpartum period with past pregnancy. Patient delivered via c-section 4 weeks ago today.  Patient reports headaches & blurry vision since she woke up this morning. Patient is not currently on BP medication. Discussed with Dr Ilda Basset who recommends patient go to MAU for evaluation. MAU notified & patient escorted upstairs.

## 2017-12-05 NOTE — MAU Note (Signed)
Sent up from clinic, PP visit. BP elevated, headache started over the weekend, took Tylenol- no relief.

## 2017-12-05 NOTE — MAU Provider Note (Signed)
History     CSN: [id number]  Arrival date and time: 12/05/17 4166   First Provider Initiated Contact with Patient 12/05/17 1019      Chief Complaint  Patient presents with  . Headache  . Hypertension   32 y.o. 4 weeks postpartum here with elevated BP and HA. Reports HA x3 days, mostly frontal but now occiptial as well. Associated sx are blurry vision. No epigastric pain. She does have hx significant for recurrent pancreatitis after a distal pancreatectomy for a cystic neoplasm in 2012. She was seen recently by GI for pancreatic pain and oily stools and is scheduled for abd MRI. She is breastfeeding. She also has hx of postpartum HTN with her last pregnancy. She was given Procardia for Raynauds last week but only took for 2 days because her breast pain improved.   OB History    Gravida  7   Para  3   Term  3   Preterm      AB  4   Living  3     SAB  3   TAB  1   Ectopic      Multiple  0   Live Births  3           Past Medical History:  Diagnosis Date  . Alcohol abuse   . Anemia   . Asthma    rarely uses inhaler  . Bipolar disorder ([place])    HX PPD after 2014 delivery, no meds  . Bronchitis   . Diabetes mellitus without complication ([place])    partial pancreatectomy 2012-glyburide  . Fibroid   . Hypertension   . Missed ab    x 2, one requiring D & E  . Pancreatic cyst   . Pancreatitis    diet controlled  . Pelvic inflammatory disease   . Plantar fasciitis   . Polycystic ovary syndrome   . Renal cyst   . Sickle cell trait [facility])     Past Surgical History:  Procedure Laterality Date  . CESAREAN SECTION N/A 05/23/2012   Procedure: CESAREAN SECTION;  Surgeon: Lahoma Crocker, MD;  Location: [facility];  Service: Obstetrics;  Laterality: N/A;  primary  . CESAREAN SECTION N/A 04/18/2014   Procedure: REPEAT CESAREAN SECTION;  Surgeon: Shelly Bombard, MD;  Location: [facility];  Service: Obstetrics;  Laterality: N/A;  . CESAREAN SECTION N/A 11/05/2017    Procedure: REPEAT CESAREAN SECTION;  Surgeon: Woodroe Mode, MD;  Location: [place];  Service: Obstetrics;  Laterality: N/A;  . DILATION AND CURETTAGE OF UTERUS      for MAB  . EUS N/A 06/11/2015   Procedure: UPPER ENDOSCOPIC ULTRASOUND (EUS) RADIAL;  Surgeon: Milus Banister, MD;  Location: [facility];  Service: Endoscopy;  Laterality: N/A;  . PANCREAS SURGERY  2012   s/p partial pancreatectomy  . SPLENECTOMY, TOTAL  2012  . WISDOM TOOTH EXTRACTION      Family History  Problem Relation Age of Onset  . Diabetes Mother   . Hypertension Mother   . Asthma Father   . Colon cancer Neg Hx     Social History   Tobacco Use  . Smoking status: Current Every Day Smoker    Packs/day: 0.25    Years: 13.00    Pack years: 3.25    Types: Cigarettes  . Smokeless tobacco: Never Used  . Tobacco comment: smokes 4 cigs/day, tobacco info given 04/17/15  Substance Use Topics  . Alcohol use: No  Alcohol/week: 0.0 standard drinks    Comment: social   . Drug use: No    Allergies:  Allergies  Allergen Reactions  . Dilaudid [Hydromorphone Hcl] Itching and Other (See Comments)    Reaction:  Hallucinations    No medications prior to admission.    Review of Systems  Eyes: Positive for visual disturbance.  Respiratory: Negative for shortness of breath.   Cardiovascular: Negative for chest pain and leg swelling.  Gastrointestinal: Negative for abdominal pain.  Neurological: Positive for headaches.   Physical Exam   Blood pressure (!) 138/91, pulse (!) 57, temperature 98.2 F (36.8 C), temperature source Oral, currently breastfeeding. Patient Vitals for the past 24 hrs:  BP Temp Temp src Pulse  12/05/17 1145 (!) 138/91 - - (!) 57  12/05/17 1130 (!) 144/96 - - (!) 52  12/05/17 1115 (!) 143/97 - - (!) 53  12/05/17 1100 (!) 139/96 - - 66  12/05/17 1045 (!) 143/104 - - 61  12/05/17 1030 (!) 140/100 - - 63  12/05/17 1015 (!) 142/98 - - 60  12/05/17 1005 (!) 145/100 98.2 F  (36.8 C) Oral 61    Physical Exam  Nursing note and vitals reviewed. Constitutional: She is oriented to person, place, and time. She appears well-developed and well-nourished. No distress.  HENT:  Head: Normocephalic and atraumatic.  Neck: Normal range of motion.  Cardiovascular: Normal rate, regular rhythm and normal heart sounds.  Respiratory: Effort normal and breath sounds normal. No respiratory distress. She has no wheezes. She has no rales.  Musculoskeletal: Normal range of motion. She exhibits no edema.  Neurological: She is alert and oriented to person, place, and time. No cranial nerve deficit.  Skin: Skin is warm and dry.  Psychiatric: She has a normal mood and affect.   Results for orders placed or performed during the hospital encounter of 12/05/17 (from the past 24 hour(s))  Protein / creatinine ratio, urine     Status: None   Collection Time: 12/05/17  9:54 AM  Result Value Ref Range   Creatinine, Urine 74.00 mg/dL   Total Protein, Urine <6 mg/dL   Protein Creatinine Ratio        0.00 - 0.15 mg/mg[Cre]  CBC     Status: Abnormal   Collection Time: 12/05/17 10:04 AM  Result Value Ref Range   WBC 5.0 4.0 - 10.5 K/uL   RBC 5.67 (H) 3.87 - 5.11 MIL/uL   Hemoglobin 13.1 12.0 - 15.0 g/dL   HCT 38.6 36.0 - 46.0 %   MCV 68.1 (L) 78.0 - 100.0 fL   MCH 23.1 (L) 26.0 - 34.0 pg   MCHC 33.9 30.0 - 36.0 g/dL   RDW 15.9 (H) 11.5 - 15.5 %   Platelets 595 (H) 150 - 400 K/uL  Comprehensive metabolic panel     Status: Abnormal   Collection Time: 12/05/17 10:04 AM  Result Value Ref Range   Sodium 138 135 - 145 mmol/L   Potassium 4.2 3.5 - 5.1 mmol/L   Chloride 105 98 - 111 mmol/L   CO2 25 22 - 32 mmol/L   Glucose, Bld 93 70 - 99 mg/dL   BUN 9 6 - 20 mg/dL   Creatinine, Ser 0.62 0.44 - 1.00 mg/dL   Calcium 9.0 8.9 - 10.3 mg/dL   Total Protein 8.2 (H) 6.5 - 8.1 g/dL   Albumin 4.1 3.5 - 5.0 g/dL   AST 17 15 - 41 U/L   ALT 14 0 - 44 U/L  Alkaline Phosphatase 90 38 - 126 U/L    Total Bilirubin 0.7 0.3 - 1.2 mg/dL   GFR calc non Af Amer >60 >60 mL/min   GFR calc Af Amer >60 >60 mL/min   Anion gap 8 5 - 15  Glucose, capillary     Status: Abnormal   Collection Time: 12/05/17 11:50 AM  Result Value Ref Range   Glucose-Capillary 147 (H) 70 - 99 mg/dL   MAU Course  Procedures Fioricet Flexeril  MDM Labs ordered and reviewed. HA improved, almost resolved. No evidence of PEC. Will start Procardia and arrange BP check in 2 days. Stable for discharge home.   Assessment and Plan   1. Postpartum hypertension    Discharge home Follow up in [place] in 2 days for BP check-message sent Return precautions Procardia 30 XL (has Rx)  Allergies as of 12/05/2017      Reactions   Dilaudid [hydromorphone Hcl] Itching, Other (See Comments)   Reaction:  Hallucinations      Medication List    TAKE these medications   acetaminophen 500 MG tablet Commonly known as:  TYLENOL Take 500-1,000 mg by mouth every 6 (six) hours as needed for mild pain, moderate pain or headache.   calcium carbonate 500 MG chewable tablet Commonly known as:  TUMS - dosed in mg elemental calcium Chew 2 tablets by mouth 2 (two) times daily as needed for indigestion or heartburn.   clotrimazole 1 % cream Commonly known as:  LOTRIMIN Apply 1 application topically 2 (two) times daily for 10 days.   glyBURIDE 2.5 MG tablet Commonly known as:  DIABETA Take 1.25 mg by mouth 2 (two) times daily with a meal.   NIFEdipine 30 MG 24 hr tablet Commonly known as:  PROCARDIA-XL/ADALAT-CC/NIFEDICAL-XL Take 1 tablet (30 mg total) by mouth daily.   oxyCODONE-acetaminophen 5-325 MG tablet Commonly known as:  PERCOCET/ROXICET Take 1 tablet by mouth every 6 hours as needed for pain.   prenatal multivitamin Tabs tablet Take 1 tablet by mouth daily at 12 noon.   Simethicone 80 MG Tabs Take 160 mg by mouth daily as needed.      Julianne Handler, CNM 12/05/2017, 1:34 PM

## 2017-12-05 NOTE — MAU Note (Signed)
Pt states she has pancreatitis @ this time.

## 2017-12-07 ENCOUNTER — Ambulatory Visit: Payer: Medicaid Other

## 2017-12-08 ENCOUNTER — Ambulatory Visit: Payer: Medicaid Other

## 2017-12-08 NOTE — Progress Notes (Signed)
I have reviewed the chart and agree with nursing staff's documentation of this patient's encounter.  Aletha Halim, MD 12/08/2017 8:15 AM

## 2017-12-11 ENCOUNTER — Ambulatory Visit: Payer: Medicaid Other | Admitting: Obstetrics & Gynecology

## 2017-12-19 ENCOUNTER — Encounter: Payer: Self-pay | Admitting: Advanced Practice Midwife

## 2017-12-19 ENCOUNTER — Ambulatory Visit (HOSPITAL_COMMUNITY)
Admission: RE | Admit: 2017-12-19 | Discharge: 2017-12-19 | Disposition: A | Discharge status: Home / Self Care | Payer: Medicaid Other | Source: Ambulatory Visit | Attending: Physician Assistant | Admitting: Physician Assistant

## 2017-12-19 ENCOUNTER — Other Ambulatory Visit: Payer: Self-pay | Admitting: Physician Assistant

## 2017-12-19 ENCOUNTER — Ambulatory Visit (INDEPENDENT_AMBULATORY_CARE_PROVIDER_SITE_OTHER): Payer: Medicaid Other | Admitting: Advanced Practice Midwife

## 2017-12-19 VITALS — BP 138/81 | HR 73 | Ht 60.0 in | Wt 141.3 lb

## 2017-12-19 DIAGNOSIS — D490 Neoplasm of unspecified behavior of digestive system: Secondary | ICD-10-CM

## 2017-12-19 DIAGNOSIS — G8929 Other chronic pain: Secondary | ICD-10-CM | POA: Diagnosis not present

## 2017-12-19 DIAGNOSIS — Z9081 Acquired absence of spleen: Secondary | ICD-10-CM | POA: Diagnosis not present

## 2017-12-19 DIAGNOSIS — R1011 Right upper quadrant pain: Principal | ICD-10-CM

## 2017-12-19 DIAGNOSIS — Z90411 Acquired partial absence of pancreas: Secondary | ICD-10-CM

## 2017-12-19 DIAGNOSIS — Z1389 Encounter for screening for other disorder: Secondary | ICD-10-CM | POA: Diagnosis not present

## 2017-12-19 DIAGNOSIS — D136 Benign neoplasm of pancreas: Secondary | ICD-10-CM | POA: Diagnosis not present

## 2017-12-19 DIAGNOSIS — O0990 Supervision of high risk pregnancy, unspecified, unspecified trimester: Secondary | ICD-10-CM

## 2017-12-19 DIAGNOSIS — R935 Abnormal findings on diagnostic imaging of other abdominal regions, including retroperitoneum: Secondary | ICD-10-CM | POA: Diagnosis not present

## 2017-12-19 MED ORDER — GADOBUTROL 1 MMOL/ML IV SOLN
6.0000 mL | Freq: Once | INTRAVENOUS | Status: AC | PRN
  Administered 2017-12-19: 6 mL via INTRAVENOUS

## 2017-12-19 MED ORDER — CEPHALEXIN 500 MG PO CAPS: 500.0000 mg | ORAL_CAPSULE | Freq: Three times a day (TID) | ORAL | 0 refills | Status: DC

## 2017-12-19 NOTE — Patient Instructions (Signed)

## 2017-12-19 NOTE — Progress Notes (Signed)
Subjective:     [name] is a 32 y.o. female who presents for a postpartum visit. She is 6 weeks postpartum following a low cervical transverse Cesarean section. I have fully reviewed the prenatal and intrapartum course. The delivery was at 5 gestational weeks. Outcome: repeat cesarean section, low transverse incision. Anesthesia: spinal. Postpartum course has been abnormal with incisional pain and pain at urethra.  Baby's course has been abnormal and is recently admitted at [facility] for breathing problems without known cause.  Baby is feeding by breast. Bleeding staining only. Bowel function is normal. Bladder function is abnormal: She has urinary incontinence with bowel movements only and pain at urethra with recent intercourse. . Patient is sexually active. Contraception method is tubal ligation. Postpartum depression screening: positive.  The following portions of the patient's history were reviewed and updated as appropriate: allergies, current medications, past family history, past medical history, past social history, past surgical history and problem list.  Review of Systems Pertinent items noted in HPI and remainder of comprehensive ROS otherwise negative.   Objective:    BP 138/81   Pulse 73   Ht 5' (1.524 m)   Wt 64.1 kg   Breastfeeding? Yes   BMI 27.60 kg/m   VS reviewed, nursing note reviewed,  Constitutional: well developed, well nourished, no distress HEENT: normocephalic CV: normal rate Pulm/chest wall: normal effort Abdomen: soft Neuro: alert and oriented x 3 Skin: warm, dry, incision well approximated, no edema, erythema, or exudate.  Pain to palpation of area 1cm-5 cm above incision, no palpable mass or skin abnormalities. Psych: affect normal       Assessment/Plan:   1. At high risk for postpartum complications --Pt with pain, pancreatitis flare up, and problems with infant since surgery. She has questions about her surgery and her hospital  stay.  She wants to know if anything during her surgery could affect her baby or her own health.   --Message sent to office manager to set up follow up with Dr Roselie Awkward or with attending. --Pt given contact information for [facility].  - Ambulatory referral to [facility]  2. Postpartum examination following cesarean delivery --No evidence of infection, incision healing well but pain is superficial above incision, significant to palpation. Could be evidence of cellulitis given pt diabetes.  Will treat with Keflex 500 mg TID x 7 days. --Pt to f/u with attending, see above.    Social/emotional concerns:  Pt with good support but infant admitted to hospital so at risk for depression/anxiety.  Contraception: tubal ligation  Follow up in: 2 weeks or as needed.   Fatima Blank, CNM 7:00 PM

## 2018-01-15 NOTE — BH Specialist Note (Signed)
Integrated Behavioral Health Initial Visit  MRN: [id number] Name: [name]  Number of [place] Clinician visits:: 1/6 Session Start time: 9:35 Session End time: 11:05 Total time: 1 hour  Type of Service: [place] Interpretor:No. Interpretor Name and Language: n/a   Warm Hand Off Completed.       SUBJECTIVE: [name] is a 32 y.o. female accompanied by n/a Patient was referred by [name], CNM for depression postpartum. Patient reports the following symptoms/concerns: Pt states her primary concern today is an increase in anxiety and panic attacks postpartum, along with overwhelming life responsibilities (32yo, 33yo with autism, newborn, increase in husband's required child support for 53yo; "dealing with" numerous family-of-origin stressors.  Duration of problem: Postpartum; Severity of problem: moderately severe  OBJECTIVE: Mood: Anxious and Affect: Tearful Risk of harm to self or others: No plan to harm self or others  LIFE CONTEXT: Family and Social: Pt lives with her husband and three children (85yo, 41yo with autism; newborn) School/Work: Maternity leave; husband works as Buyer, retail: Recognizing a greater need for self-care Life Changes: Recent childbirth; life stress regarding family/extended family  GOALS ADDRESSED: Patient will: 1. Reduce symptoms of: anxiety and stress 2. Increase knowledge and/or ability of: healthy habits and self-management skills  3. Demonstrate ability to: Increase healthy adjustment to current life circumstances  INTERVENTIONS: Interventions utilized: Mindfulness or Psychologist, educational, Psychoeducation and/or Health Education and Link to [employer]  Standardized Assessments completed: GAD-7 and PHQ 9  ASSESSMENT: Patient currently experiencing Anxiety disorder, unspecified.   Patient may benefit from psychoeducation and brief  therapeutic interventions regarding coping with symptoms of anxiety .  PLAN: 1. Follow up with behavioral health clinician on : Two days via phone  2. Behavioral recommendations:  -Begin taking BH medication, as prescribed by medical provider -[facility] [place] [facility]-in [facility] to establish care within one week -CALM relaxation breathing exercise as needed throughout the day -Consider Worry Time strategy to begin to prioritize life stress -Read educational materials regarding coping with symptoms of anxiety   3. Referral(s): [facility] (In Clinic) and [facility] (LME/Outside Clinic) 4. "From scale of 1-10, how likely are you to follow plan?": 9  Jennifer Fair, LCSW  Depression screen [facility] 2/9 01/16/2018 10/31/2017 10/25/2017 10/04/2017 09/26/2017  Decreased Interest 2 1 0 1 1  Down, Depressed, Hopeless 1 0 0 1 0  PHQ - 2 Score 3 1 0 2 1  Altered sleeping 0 0 1 0 0  Tired, decreased energy 1 1 1 1 1   Change in appetite 1 0 0 0 0  Feeling bad or failure about yourself  1 0 0 0 0  Trouble concentrating 1 1 0 0 0  Moving slowly or fidgety/restless 1 0 0 0 0  Suicidal thoughts 0 0 0 0 0  PHQ-9 Score 8 3 2 3 2   Some recent data might be hidden   GAD 7 : Generalized Anxiety Score 01/16/2018 10/31/2017 10/25/2017 10/04/2017  Nervous, Anxious, on Edge 2 0 0 0  Control/stop worrying 2 1 0 1  Worry too much - different things 2 0 0 1  Trouble relaxing 1 0 0 0  Restless 1 1 0 0  Easily annoyed or irritable 2 1 1 1   Afraid - awful might happen 3 0 0 0  Total GAD 7 Score 13 3 1  3

## 2018-01-16 ENCOUNTER — Ambulatory Visit (INDEPENDENT_AMBULATORY_CARE_PROVIDER_SITE_OTHER): Payer: Medicaid Other | Admitting: Clinical

## 2018-01-16 ENCOUNTER — Other Ambulatory Visit: Payer: Self-pay | Admitting: Obstetrics & Gynecology

## 2018-01-16 DIAGNOSIS — F419 Anxiety disorder, unspecified: Secondary | ICD-10-CM | POA: Diagnosis not present

## 2018-01-16 DIAGNOSIS — F418 Other specified anxiety disorders: Principal | ICD-10-CM

## 2018-01-16 DIAGNOSIS — O99345 Other mental disorders complicating the puerperium: Secondary | ICD-10-CM

## 2018-01-16 MED ORDER — SERTRALINE HCL 50 MG PO TABS: 50.0000 mg | ORAL_TABLET | Freq: Every day | ORAL | 3 refills | Status: DC

## 2018-01-16 MED ORDER — SERTRALINE HCL 25 MG PO TABS: 25.0000 mg | ORAL_TABLET | Freq: Every day | ORAL | 0 refills | Status: DC

## 2018-01-16 NOTE — Progress Notes (Signed)
zoloft

## 2018-01-19 ENCOUNTER — Telehealth: Payer: Self-pay | Admitting: Clinical

## 2018-01-19 NOTE — Telephone Encounter (Signed)
Integrated Behavioral Health Medication Management Phone Note  MRN: [id number] NAME: [name]  Time Call Initiated: 9:42 Time Call Completed: 9:45 Total Call Time: 15 minutes  Current Medications:  Outpatient Medications Prior to Visit  Medication Sig Dispense Refill  . acetaminophen (TYLENOL) 500 MG tablet Take 500-1,000 mg by mouth every 6 (six) hours as needed for mild pain, moderate pain or headache.     . calcium carbonate (TUMS - DOSED IN MG ELEMENTAL CALCIUM) 500 MG chewable tablet Chew 2 tablets by mouth 2 (two) times daily as needed for indigestion or heartburn.    . cephALEXin (KEFLEX) 500 MG capsule Take 1 capsule (500 mg total) by mouth 3 (three) times daily. 21 capsule 0  . glyBURIDE (DIABETA) 2.5 MG tablet Take 1.25 mg by mouth 2 (two) times daily with a meal.   5  . NIFEdipine (PROCARDIA-XL/ADALAT-CC/NIFEDICAL-XL) 30 MG 24 hr tablet Take 1 tablet (30 mg total) by mouth daily. (Patient not taking: Reported on 12/05/2017) 30 tablet 3  . oxyCODONE-acetaminophen (PERCOCET/ROXICET) 5-325 MG tablet Take 1 tablet by mouth every 6 hours as needed for pain. 30 tablet 0  . Prenatal Vit-Fe Fumarate-FA (PRENATAL MULTIVITAMIN) TABS tablet Take 1 tablet by mouth daily at 12 noon.    . sertraline (ZOLOFT) 25 MG tablet Take 1 tablet (25 mg total) by mouth daily. For 1 week. 7 tablet 0  . sertraline (ZOLOFT) 50 MG tablet Take 1 tablet (50 mg total) by mouth daily. 30 tablet 3  . Simethicone 80 MG TABS Take 160 mg by mouth daily as needed.     No facility-administered medications prior to visit.     Patient has been able to get all medications filled as prescribed: Yes  Patient is currently taking all medications as prescribed: Yes  Patient reports experiencing side effects: No  Patient describes feeling this way on medications: n/a  Additional patient concerns: no  Patient advised to schedule appointment with provider for evaluation of medication side effects or additional  concerns: No   [place], LCSW

## 2018-02-26 ENCOUNTER — Telehealth: Payer: Self-pay | Admitting: Physician Assistant

## 2018-02-26 NOTE — Telephone Encounter (Signed)
Pt requests med refill for percocet.  Pt's medicaid is inactive and asks for samples of Creon.  Pt has had pancreatitis for 4 days and out of medication.

## 2018-02-26 NOTE — Telephone Encounter (Signed)
Dr. Raquel James is it ok to give patient refill of Percocet? She has not had any Creeon for the past month and is having a lot of pain x 4 days.   We do have samples of Creeon and I will try to get her set up for patient assistance. She states she only has a couple of Percocet pills left and doesn't want to be ill over the holdays.

## 2018-03-01 MED ORDER — OXYCODONE-ACETAMINOPHEN 5-325 MG PO TABS: ORAL_TABLET | ORAL | 0 refills | Status: DC

## 2018-03-01 NOTE — Telephone Encounter (Signed)
Patient informed and Rx placed up front for pickup.

## 2018-03-01 NOTE — Telephone Encounter (Signed)
PMP aware used to query narcotics database for [place] and surrounding states -- no evidence of multiple prescriptions for narcotics She received 30 tablets when she saw Nicoletta Ba in September and 12 tablets by from the ER earlier in September  Okay to use Percocet but very sparingly Okay to refill 5-3 25 mg every 8 hours as needed, #30 no refills

## 2018-04-14 ENCOUNTER — Encounter (HOSPITAL_COMMUNITY): Payer: Self-pay | Admitting: Emergency Medicine

## 2018-04-14 ENCOUNTER — Emergency Department (HOSPITAL_COMMUNITY)
Admission: EM | Admit: 2018-04-14 | Discharge: 2018-04-14 | Disposition: A | Discharge status: Home / Self Care | Payer: Medicaid Other | Attending: Emergency Medicine | Admitting: Emergency Medicine

## 2018-04-14 ENCOUNTER — Other Ambulatory Visit: Payer: Self-pay

## 2018-04-14 DIAGNOSIS — I10 Essential (primary) hypertension: Secondary | ICD-10-CM | POA: Insufficient documentation

## 2018-04-14 DIAGNOSIS — J45909 Unspecified asthma, uncomplicated: Secondary | ICD-10-CM | POA: Insufficient documentation

## 2018-04-14 DIAGNOSIS — E119 Type 2 diabetes mellitus without complications: Secondary | ICD-10-CM | POA: Insufficient documentation

## 2018-04-14 DIAGNOSIS — F17210 Nicotine dependence, cigarettes, uncomplicated: Secondary | ICD-10-CM | POA: Insufficient documentation

## 2018-04-14 DIAGNOSIS — G8929 Other chronic pain: Secondary | ICD-10-CM | POA: Insufficient documentation

## 2018-04-14 DIAGNOSIS — R1013 Epigastric pain: Secondary | ICD-10-CM | POA: Insufficient documentation

## 2018-04-14 DIAGNOSIS — Z79899 Other long term (current) drug therapy: Secondary | ICD-10-CM | POA: Insufficient documentation

## 2018-04-14 DIAGNOSIS — Z7984 Long term (current) use of oral hypoglycemic drugs: Secondary | ICD-10-CM | POA: Insufficient documentation

## 2018-04-14 LAB — COMPREHENSIVE METABOLIC PANEL
ALBUMIN: 4.3 g/dL (ref 3.5–5.0)
ALT: 10 U/L (ref 0–44)
AST: 16 U/L (ref 15–41)
Alkaline Phosphatase: 51 U/L (ref 38–126)
Anion gap: 7 (ref 5–15)
BUN: 10 mg/dL (ref 6–20)
CO2: 24 mmol/L (ref 22–32)
CREATININE: 0.48 mg/dL (ref 0.44–1.00)
Calcium: 8.9 mg/dL (ref 8.9–10.3)
Chloride: 108 mmol/L (ref 98–111)
GFR calc Af Amer: >60 mL/min (ref >60)
GFR calc non Af Amer: >60 mL/min (ref >60)
GLUCOSE: 117 mg/dL — AB (ref 70–99)
Potassium: 4.1 mmol/L (ref 3.5–5.1)
Sodium: 139 mmol/L (ref 135–145)
Total Bilirubin: 0.6 mg/dL (ref 0.3–1.2)
Total Protein: 7.6 g/dL (ref 6.5–8.1)

## 2018-04-14 LAB — I-STAT BETA HCG BLOOD, ED (MC, WL, AP ONLY): I-stat hCG, quantitative: <5 m[IU]/mL (ref <5)

## 2018-04-14 LAB — URINALYSIS, ROUTINE W REFLEX MICROSCOPIC
BILIRUBIN URINE: NEGATIVE
GLUCOSE, UA: NEGATIVE mg/dL
HGB URINE DIPSTICK: NEGATIVE
Ketones, ur: NEGATIVE mg/dL
Leukocytes, UA: NEGATIVE
NITRITE: NEGATIVE
PH: 5 (ref 5.0–8.0)
Protein, ur: NEGATIVE mg/dL
Specific Gravity, Urine: 1.02 (ref 1.005–1.030)

## 2018-04-14 LAB — CBC
HEMATOCRIT: 36.2 % (ref 36.0–46.0)
Hemoglobin: 11.8 g/dL — ABNORMAL LOW (ref 12.0–15.0)
MCH: 23.3 pg — AB (ref 26.0–34.0)
MCHC: 32.6 g/dL (ref 30.0–36.0)
MCV: 71.5 fL — AB (ref 80.0–100.0)
Platelets: 469 10*3/uL — ABNORMAL HIGH (ref 150–400)
RBC: 5.06 MIL/uL (ref 3.87–5.11)
RDW: 14.8 % (ref 11.5–15.5)
WBC: 4.5 10*3/uL (ref 4.0–10.5)
nRBC: 0 % (ref 0.0–0.2)

## 2018-04-14 LAB — LIPASE, BLOOD: LIPASE: 27 U/L (ref 11–51)

## 2018-04-14 MED ORDER — METHOCARBAMOL 500 MG PO TABS: 500.0000 mg | ORAL_TABLET | Freq: Two times a day (BID) | ORAL | 0 refills | Status: DC

## 2018-04-14 NOTE — Discharge Instructions (Addendum)
Return here for fever, emesis, change in color of your urine, or any other problems

## 2018-04-14 NOTE — ED Provider Notes (Signed)
[facility] DEPT Provider Note   CSN: [id number] Arrival date & time: 04/14/18  0725     History   Chief Complaint Chief Complaint  Patient presents with  . Flank Pain  . Abdominal Pain    HPI [name] is a 33 y.o. female.  33 year old female presents with chronic complaints times several months of abdominal discomfort chest discomfort which she said started after she delivered her baby.  Patient has had trouble receiving health care due to losing her Medicaid.  States that she has not really had any change in her symptoms in the last several weeks.  Denies any fever, emesis.  No dysuria or hematuria.  No cough or congestion.  Reports some pain around her right flank which she says she has scar tissue.  Also has chronic pain in the epigastric area.  Denies any change to her bowels.  No treatment used for his complaints prior to arrival     Past Medical History:  Diagnosis Date  . Alcohol abuse   . Anemia   . Asthma    rarely uses inhaler  . Bipolar disorder ([place])    HX PPD after 2014 delivery, no meds  . Bronchitis   . 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 Fibroid   . Hypertension   . Missed ab    x 2, one requiring D & E  . Pancreatic cyst   . Pancreatitis    diet controlled  . Pelvic inflammatory disease   . Plantar fasciitis   . Polycystic ovary syndrome   . Renal cyst   . Sickle cell trait [facility])     Patient Active Problem List   Diagnosis Date Noted  . Request for sterilization 07/20/2017  . Fibroid 05/08/2017  . Pancreatitis 05/08/2017  . History of postpartum severe pre-eclampsia 04/10/2017  . History of cesarean delivery 04/10/2017  . 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 Vitamin D deficiency 12/29/2013  . Asthma 12/25/2013  . Genital warts 12/22/2011  . Pancreatic pseudocyst 06/24/2010    Past Surgical History:  Procedure Laterality  Date  . CESAREAN SECTION N/A 05/23/2012   Procedure: CESAREAN SECTION;  Surgeon: Lahoma Crocker, MD;  Location: [facility];  Service: Obstetrics;  Laterality: N/A;  primary  . CESAREAN SECTION N/A 04/18/2014   Procedure: REPEAT CESAREAN SECTION;  Surgeon: Shelly Bombard, MD;  Location: [facility];  Service: Obstetrics;  Laterality: N/A;  . CESAREAN SECTION N/A 11/05/2017   Procedure: REPEAT CESAREAN SECTION;  Surgeon: Woodroe Mode, MD;  Location: [facility];  Service: Obstetrics;  Laterality: N/A;  . DILATION AND CURETTAGE OF UTERUS      for MAB  . EUS N/A 06/11/2015   Procedure: UPPER ENDOSCOPIC ULTRASOUND (EUS) RADIAL;  Surgeon: Milus Banister, MD;  Location: [facility];  Service: Endoscopy;  Laterality: N/A;  . PANCREAS SURGERY  2012   s/p partial pancreatectomy  . SPLENECTOMY, TOTAL  2012  . WISDOM TOOTH EXTRACTION       OB History    Gravida  7   Para  3   Term  3   Preterm      AB  4   Living  3     SAB  3   TAB  1   Ectopic      Multiple  0   Live Births  3  Home Medications    Prior to Admission medications   Medication Sig Start Date End Date Taking? Authorizing Provider  acetaminophen (TYLENOL) 500 MG tablet Take 500-1,000 mg by mouth every 6 (six) hours as needed for mild pain, moderate pain or headache.     [provider]  calcium carbonate (TUMS - DOSED IN MG ELEMENTAL CALCIUM) 500 MG chewable tablet Chew 2 tablets by mouth 2 (two) times daily as needed for indigestion or heartburn.    [provider]  cephALEXin (KEFLEX) 500 MG capsule Take 1 capsule (500 mg total) by mouth 3 (three) times daily. 12/19/17   Leftwich-Kirby, Kathie Dike, CNM  glyBURIDE (DIABETA) 2.5 MG tablet Take 1.25 mg by mouth 2 (two) times daily with a meal.  11/13/17   [provider]  NIFEdipine (PROCARDIA-XL/ADALAT-CC/NIFEDICAL-XL) 30 MG 24 hr tablet Take 1 tablet (30 mg total) by mouth daily. Patient not taking: Reported on 12/05/2017  11/29/17   Glenice Bow, DO  oxyCODONE-acetaminophen (PERCOCET/ROXICET) 5-325 MG tablet Take 1 tablet by mouth every 8 hours as needed for pain. 03/01/18   Pyrtle, Lajuan Lines, MD  Prenatal Vit-Fe Fumarate-FA (PRENATAL MULTIVITAMIN) TABS tablet Take 1 tablet by mouth daily at 12 noon.    [provider]  sertraline (ZOLOFT) 25 MG tablet Take 1 tablet (25 mg total) by mouth daily. For 1 week. 01/16/18   Lavonia Drafts, MD  sertraline (ZOLOFT) 50 MG tablet Take 1 tablet (50 mg total) by mouth daily. 01/16/18   Lavonia Drafts, MD  Simethicone 80 MG TABS Take 160 mg by mouth daily as needed.    [provider]    Family History Family History  Problem Relation Age of Onset  . Diabetes Mother   . Hypertension Mother   . Asthma Father   . Colon cancer Neg Hx     Social History Social History   Tobacco Use  . Smoking status: Current Every Day Smoker    Packs/day: 0.25    Years: 13.00    Pack years: 3.25    Types: Cigarettes  . Smokeless tobacco: Never Used  . Tobacco comment: smokes 4 cigs/day, tobacco info given 04/17/15  Substance Use Topics  . Alcohol use: No    Alcohol/week: 0.0 standard drinks    Comment: social   . Drug use: No     Allergies   Dilaudid [hydromorphone hcl]   Review of Systems Review of Systems  All other systems reviewed and are negative.    Physical Exam Updated Vital Signs BP 122/89 (BP Location: Left Arm)   Pulse 87   Temp 98.5 F (36.9 C) (Oral)   Resp 18   Ht 1.524 m (5')   Wt 67.6 kg   LMP 04/01/2018   SpO2 100%   BMI 29.10 kg/m   Physical Exam Vitals signs and nursing note reviewed.  Constitutional:      General: She is not in acute distress.    Appearance: Normal appearance. She is well-developed. She is not toxic-appearing.  HENT:     Head: Normocephalic and atraumatic.  Eyes:     General: Lids are normal.     Conjunctiva/sclera: Conjunctivae normal.     Pupils: Pupils are equal, round, and  reactive to light.  Neck:     Musculoskeletal: Normal range of motion and neck supple.     Thyroid: No thyroid mass.     Trachea: No tracheal deviation.  Cardiovascular:     Rate and Rhythm: Normal rate and regular rhythm.  Heart sounds: Normal heart sounds. No murmur. No gallop.   Pulmonary:     Effort: Pulmonary effort is normal. No respiratory distress.     Breath sounds: Normal breath sounds. No stridor. No decreased breath sounds, wheezing, rhonchi or rales.  Abdominal:     General: Bowel sounds are normal. There is no distension.     Palpations: Abdomen is soft.     Tenderness: There is no abdominal tenderness. There is no rebound.  Musculoskeletal: Normal range of motion.        General: No tenderness.  Skin:    General: Skin is warm and dry.     Findings: No abrasion or rash.  Neurological:     Mental Status: She is alert and oriented to person, place, and time.     GCS: GCS eye subscore is 4. GCS verbal subscore is 5. GCS motor subscore is 6.     Cranial Nerves: No cranial nerve deficit.     Sensory: No sensory deficit.  Psychiatric:        Speech: Speech normal.        Behavior: Behavior normal.      ED Treatments / Results  Labs (all labs ordered are listed, but only abnormal results are displayed) Labs Reviewed  LIPASE, BLOOD  COMPREHENSIVE METABOLIC PANEL  CBC  URINALYSIS, ROUTINE W REFLEX MICROSCOPIC  I-STAT BETA HCG BLOOD, ED ([facility], [facility], [facility] ONLY)    EKG None  Radiology No results found.  Procedures Procedures (including critical care time)  Medications Ordered in ED Medications - No data to display   Initial Impression / Assessment and Plan / ED Course  I have reviewed the triage vital signs and the nursing notes.  Pertinent labs & imaging results that were available during my care of the patient were reviewed by me and considered in my medical decision making (see chart for details).    Patient's labs are reassuring here.  Urinalysis  without infection and kidney function normal.  Her lipase is normal as well 2.  Prescribe Robaxin and she will follow-up with her doctor  Final Clinical Impressions(s) / ED Diagnoses   Final diagnoses:  None    ED Discharge Orders    None       Lacretia Leigh, MD 04/14/18 [phone]

## 2018-04-14 NOTE — ED Triage Notes (Signed)
Pt reports for several days "been having pancreatitis and issues with my kidneys". Reports nausea and vomiting and right flank pain. Pt c/o lots of muscle pains and hurts to pick up her 44 month old.

## 2018-04-30 ENCOUNTER — Ambulatory Visit: Payer: Self-pay

## 2018-05-01 ENCOUNTER — Other Ambulatory Visit: Payer: Self-pay

## 2018-05-01 ENCOUNTER — Ambulatory Visit: Payer: Self-pay | Admitting: Internal Medicine

## 2018-05-01 VITALS — BP 116/81 | HR 75 | Temp 98.4°F | Ht 60.0 in | Wt 154.8 lb

## 2018-05-01 DIAGNOSIS — Z79899 Other long term (current) drug therapy: Secondary | ICD-10-CM

## 2018-05-01 DIAGNOSIS — Z9081 Acquired absence of spleen: Secondary | ICD-10-CM

## 2018-05-01 DIAGNOSIS — F53 Postpartum depression: Secondary | ICD-10-CM

## 2018-05-01 DIAGNOSIS — E119 Type 2 diabetes mellitus without complications: Secondary | ICD-10-CM

## 2018-05-01 DIAGNOSIS — Z79891 Long term (current) use of opiate analgesic: Secondary | ICD-10-CM

## 2018-05-01 DIAGNOSIS — Z7984 Long term (current) use of oral hypoglycemic drugs: Secondary | ICD-10-CM

## 2018-05-01 DIAGNOSIS — K861 Other chronic pancreatitis: Secondary | ICD-10-CM

## 2018-05-01 DIAGNOSIS — O99345 Other mental disorders complicating the puerperium: Secondary | ICD-10-CM

## 2018-05-01 DIAGNOSIS — Z720 Tobacco use: Secondary | ICD-10-CM

## 2018-05-01 DIAGNOSIS — F32A Depression, unspecified: Secondary | ICD-10-CM | POA: Insufficient documentation

## 2018-05-01 DIAGNOSIS — Z90411 Acquired partial absence of pancreas: Secondary | ICD-10-CM

## 2018-05-01 DIAGNOSIS — Z91120 Patient's intentional underdosing of medication regimen due to financial hardship: Secondary | ICD-10-CM

## 2018-05-01 DIAGNOSIS — E139 Other specified diabetes mellitus without complications: Secondary | ICD-10-CM

## 2018-05-01 HISTORY — DX: Postpartum depression: F53.0

## 2018-05-01 LAB — POCT GLYCOSYLATED HEMOGLOBIN (HGB A1C): HEMOGLOBIN A1C: 5.6 % (ref 4.0–5.6)

## 2018-05-01 LAB — GLUCOSE, CAPILLARY: GLUCOSE-CAPILLARY: 91 mg/dL (ref 70–99)

## 2018-05-01 MED ORDER — OXYCODONE-ACETAMINOPHEN 5-325 MG PO TABS: ORAL_TABLET | ORAL | 0 refills | Status: DC

## 2018-05-01 MED ORDER — METFORMIN HCL 500 MG PO TABS: 500.0000 mg | ORAL_TABLET | Freq: Two times a day (BID) | ORAL | 5 refills | Status: DC

## 2018-05-01 NOTE — Assessment & Plan Note (Signed)
Patient was very tearful throughout her visit today.  She is feeling overwhelmed with her medical problems and new limitations to care with her lack of insurance.  She has chronic pancreatitis and has been unable to afford her Creon.  Her abdominal pain has been worsening with increase in frequency of attacks since her delivery 5 months ago.  She has a poor support system at home and her significant other is a Administrator.  He travels and is frequently away for work.  She is the sole caregiver for her 3 children at home, 1 of which has medical problems and another with autism.  She is unable to work because of her chronic pancreatitis, but is not ready to apply for disability.  States she is on 64 not ready to give up on the idea of working.  She developed postpartum depression after her C-section 5 months ago.  Overall she feels that her symptoms are improving.  She has been attending counseling sessions at the [facility].  She was tried on an SSRI but did not tolerate the medication.  She is not interested in other therapies currently.  We will continue to monitor and support.

## 2018-05-01 NOTE — Assessment & Plan Note (Signed)
Patient has a history of diabetes in the setting of chronic pancreatitis.  She is prescribed metformin 500 mg twice daily.  Hemoglobin A1c today is 5.6.  Discussed potentially stopping metformin given that her diabetes is well controlled and she is compliant with a low carbohydrate diet.  She is resistant to coming off metformin.  This was discontinued previously and resulted in increase in her hemoglobin A1c.  She is actually requesting to restart her sulfonylurea.  Advised against this given her low A1c and risk for hypoglycemia.  We will continue metformin alone for now. - Continue metformin 500 mg twice daily

## 2018-05-01 NOTE — Assessment & Plan Note (Signed)
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 Since delivering her baby 5 months ago, she reports worsening and increasing frequency of her abdominal pain.  Unfortunately she recently lost her Medicaid and has been unable to afford health insurance out-of-pocket.  Creon has been too expensive for her to fill but she has been receiving samples from her gastroenterologist.  She has been using this sparingly and attempt to make the prescription last.  She is prescribed Percocet which she takes infrequently, only when pain is severe.  Percocet helps, but makes her sleepy and more difficult for her to care for her children.  She last filled on 12/26 for 30 tablets, reports she still has few tablets left.  She tries not to take opioid pain medication because she is terrified of becoming addicted.  Both of her parents had issues with alcohol and crack cocaine abuse.  She is scared of developing dependence she does not want to put her children through the same experience.  She is very tearful today.  Her pain has been interfering with her ability to care for her 3 small children at home. Today was spent a significant amount of time discussing the difference between physical dependence and addiction.  She has never taken Percocet for more than 7 days at a time during an acute attack, denies ever experiencing withdrawal symptoms.  We discussed warning signs for opioid addiction including inappropriate use of the medication, continued use despite medical or interpersonal problems, and lack of self-control.  She does lock up her Percocet to avoid it being stolen by family members.  Reassured patient that it is okay to take the medication when needed for her acute pancreatitis attacks.  She is taking the medication appropriately without red flags and has good insight into her illness.  She is established with a gastroenterologist and follows  with Dr. Hilarie Fredrickson.  She is in the process of applying for the orange card and has reached out to their office for assistance programs. -Refilled Percocet 5-325 mg (#30) PRN no refills; reviewed database, last fill 12/26 for 30 tablets --Continue Creon; will reach out to our clinic pharmacist about any financial assistance that may be available -Follow-up with gastroenterology

## 2018-05-01 NOTE — Assessment & Plan Note (Signed)
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  Patient will need appropriate vaccinations once approved for the orange card.

## 2018-05-01 NOTE — Progress Notes (Signed)
   CC: Chronic pancreatitis, diabetes  HPI:  [name] is a 33 y.o. female with past medical history outlined below here for chronic pancreatitis and diabetes and to establish care. For the details of today's visit, please refer to the assessment and plan .  Past Medical History:  Diagnosis Date  . Alcohol abuse   . Anemia   . Asthma    rarely uses inhaler  . Bipolar disorder ([place])    HX PPD after 2014 delivery, no meds  . Bronchitis   . 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 Fibroid   . Hypertension   . Missed ab    x 2, one requiring D & E  . Pancreatic cyst   . Pancreatitis    diet controlled  . Pelvic inflammatory disease   . Plantar fasciitis   . Polycystic ovary syndrome   . Renal cyst   . Sickle cell trait ([place])     Family History: Mom with DM and HTN, Dad with asthma, 2 sisters with HTN, and brother, mom, niece with epilepsy.   Social History: Lives with significant other and three children. Does not work currently work, previously worked in [employer] information [employer]. Completed high school education. Smokes 1/3rd pack a day. Does not drink alcohol, denies illicit drug use.   Review of Systems  Constitutional: Negative for chills and fever.  HENT: Negative for congestion and sinus pain.   Eyes: Negative for blurred vision and redness.  Respiratory: Negative for shortness of breath and wheezing.   Cardiovascular: Negative for chest pain and palpitations.  Gastrointestinal: Positive for abdominal pain, nausea and vomiting.  Genitourinary: Positive for flank pain. Negative for dysuria.  Musculoskeletal: Negative for back pain and joint pain.  Skin: Negative for itching and rash.  Neurological: Negative for dizziness and headaches.  Endo/Heme/Allergies: Negative for environmental allergies and polydipsia.  Psychiatric/Behavioral: Positive for depression. Negative for suicidal ideas.    Physical  Exam:  Vitals:   05/01/18 1017  BP: 116/81  Pulse: 75  Temp: 98.4 F (36.9 C)  TempSrc: Oral  SpO2: 100%  Weight: 154 lb 12.8 oz (70.2 kg)  Height: 5' (1.524 m)    Constitutional: NAD, appears comfortable HEENT: PERRL, NCAT Neck: Supple, trachea midline. No lymphadenopathy.  Cardiovascular: RRR, no m/r/g Pulmonary/Chest: CTAB, no wheezes, rales, or rhonchi.  Abdominal: Soft, non tender, non distended. +BS. Well healed midline scar.  Extremities: Warm and well perfused. No edema.  Neurological: A&Ox3, CN II - XII grossly intact.  Skin: No rashes or erythema  Psychiatric: Normal mood and affect  Assessment & Plan:   See Encounters Tab for problem based charting.  Patient discussed with Dr. Evette Doffing

## 2018-05-01 NOTE — Patient Instructions (Signed)
[name],  It was a pleasure to meet you. I am sorry to hear about everything you have going on right now! I will touch base with our clinic pharmacist about your creon. I have refilled your oxycodone, please take this only as needed.  Follow up with [place] again in 1 month. If you have any questions or concerns, call our clinic at [phone] or after hours call [phone] and ask for the internal medicine resident on call. Thank you!  Dr. Philipp Ovens

## 2018-05-02 NOTE — Progress Notes (Signed)
Internal Medicine Clinic Attending  Case discussed with Dr. Guilloud at the time of the visit.  We reviewed the resident's history and exam and pertinent patient test results.  I agree with the assessment, diagnosis, and plan of care documented in the resident's note.  

## 2018-05-09 ENCOUNTER — Ambulatory Visit: Payer: Self-pay

## 2018-05-16 ENCOUNTER — Ambulatory Visit: Payer: Self-pay

## 2018-05-16 ENCOUNTER — Other Ambulatory Visit: Payer: Self-pay

## 2018-05-16 ENCOUNTER — Encounter: Payer: Self-pay | Admitting: Pharmacist

## 2018-05-16 NOTE — Progress Notes (Signed)
Medication Samples have been provided to the patient.  Drug name: Zenpep       Strength: 40,000 units        Qty: 4 bottles (48 capsules)  LOT: [id number]  Exp.Date: 06/2019  Dosing instructions: 1 capsule TID  The patient has been instructed regarding the correct time, dose, and frequency of taking this medication, including desired effects and most common side effects.   [name] 1:00 PM 05/16/2018

## 2018-05-29 ENCOUNTER — Ambulatory Visit: Payer: Self-pay

## 2018-05-31 NOTE — Telephone Encounter (Signed)
Opened in error

## 2018-06-11 ENCOUNTER — Encounter: Payer: Self-pay | Admitting: Pharmacist

## 2018-06-11 NOTE — Progress Notes (Signed)
Patient was approved for free CREON from [employer] patient assistance program. Patient's first medication shipment is going out today. She is approved for refills for 1 year.

## 2018-07-03 ENCOUNTER — Other Ambulatory Visit: Payer: Self-pay | Admitting: Internal Medicine

## 2018-07-03 DIAGNOSIS — K861 Other chronic pancreatitis: Secondary | ICD-10-CM

## 2018-07-04 MED ORDER — OXYCODONE-ACETAMINOPHEN 5-325 MG PO TABS: ORAL_TABLET | ORAL | 0 refills | Status: DC

## 2018-07-04 NOTE — Telephone Encounter (Signed)
Contacted patient about pain medication refill. She reports that it has been rough, she is struggling now to take care of kids, do her home schooling and is now having some pain but that it is tolerable. She reported that she has had two abdominal pain attacks over this past month, on during the week of the 9th and one over the weekend. She has been taking the Percocet only when she has the attacks, and will only take 1/2 tablet at a time. She reports that she has been taking the Creon which she states helps, she has not followed up with a [facility] doctor since last year due to insurance issues. She plans to see one when she is able. Reviewed PDMP review, last refilled on 05/01/18. Patient appears to have good insight into her illness and is using pain medications appropriately.   She feels less depressed and has been feeling a lot better, she feels like she is back to herself and looks forward to doing stuff now. She doesn't feel irritated anymore. Overall she reports significant improvement in her mood from her last appointment.      Will refill this prescription for Percocet 5-325 (#30) PRN no refills Advised patient to follow up with GI doctor when able Continue Creon

## 2018-10-15 ENCOUNTER — Ambulatory Visit: Payer: Self-pay | Admitting: Obstetrics & Gynecology

## 2018-10-15 ENCOUNTER — Telehealth: Payer: Self-pay | Admitting: Obstetrics & Gynecology

## 2018-10-15 NOTE — Telephone Encounter (Signed)
Received a call from [name] stating she would need to reschedule her appointment because she did not have a sitter. She has requested to have the latest appointment of the day.

## 2018-10-23 ENCOUNTER — Telehealth: Payer: Self-pay | Admitting: Internal Medicine

## 2018-10-23 NOTE — Telephone Encounter (Signed)
Spoke to the patient who reported she currently has no medical insurance but has recently fought for her Medicaid and she thinks she will have it reinstated within 45 days or so. She reports she is having a pancreatitis attack and the pain radiates to her back is severe. She states that she has not had her "enzymes" in a long while and knew that at some point this pain would develop. She reported through [place], they put her in contact with the manufacturer of the Creon but the dose was "way too high" and intolerable. She has an appt with Amy scheduled on 9/2, she could not come earlier due to having to home school her children and her husband being away on the road as a truck driver. She states she has barely been eating because when she eats her pancreas hurts even more. She is requesting an oxycodone prescription for the pain until her Medicaid is reinstated and she is able to get the Creon at a more affordable price. Please advise.

## 2018-10-23 NOTE — Telephone Encounter (Signed)
I did a query with the [place] narcotic database and she has not received prescription for narcotics since September 2019 She does have a history of recurrent pancreatitis with considerable pain warranting pain control She definitely needs to continue to work to get Medicaid reestablished Please see if samples of Creon can be provided to the patient Okay to refill oxycodone 5 mg/acetaminophen 325 mg 1 to 2 tablets every 8 hours as needed for severe abdominal pain #30 no refills

## 2018-10-24 ENCOUNTER — Other Ambulatory Visit: Payer: Self-pay | Admitting: *Deleted

## 2018-10-24 DIAGNOSIS — K861 Other chronic pancreatitis: Secondary | ICD-10-CM

## 2018-10-24 MED ORDER — OXYCODONE-ACETAMINOPHEN 5-325 MG PO TABS: 1.0000 | ORAL_TABLET | Freq: Three times a day (TID) | ORAL | 0 refills | Status: DC | PRN

## 2018-10-24 NOTE — Telephone Encounter (Addendum)
1. Dr. Hilarie Fredrickson, please fill the prescription as I cannot due to this being a controlled substance. Thank you.   2. The patient states that she was previously taking 24,000 units BID and 12,000 units with one snack. The only samples that we have is 36,000 units per capsule. The patient is afraid to take such a high dose and wanted to confirm with Dr. Hilarie Fredrickson if this is okay. Please advise.    FYI- Creon samples and co pay card at front desk. Vaughan Basta RN working on setting the patient up with a [employer].

## 2018-10-24 NOTE — Progress Notes (Unsigned)
Entered in error

## 2018-10-24 NOTE — Telephone Encounter (Signed)
Yes, you really cannot overdose pancreatic enzymes, any "extra" lipase will be metabolized and not cause problems Thus, okay for the samples with higher lipase   I will send Rx for pain med

## 2018-10-24 NOTE — Telephone Encounter (Signed)
Spoke to the patient, relayed Dr. Vena Rua message. Will send family member to pick up prescription.

## 2018-11-01 ENCOUNTER — Ambulatory Visit (INDEPENDENT_AMBULATORY_CARE_PROVIDER_SITE_OTHER): Payer: Medicaid Other | Admitting: Obstetrics & Gynecology

## 2018-11-01 ENCOUNTER — Encounter: Payer: Self-pay | Admitting: Obstetrics & Gynecology

## 2018-11-01 ENCOUNTER — Other Ambulatory Visit: Payer: Self-pay

## 2018-11-01 VITALS — BP 116/81 | HR 73 | Temp 99.0°F | Ht 60.0 in | Wt 156.2 lb

## 2018-11-01 DIAGNOSIS — N920 Excessive and frequent menstruation with regular cycle: Secondary | ICD-10-CM | POA: Insufficient documentation

## 2018-11-01 DIAGNOSIS — D219 Benign neoplasm of connective and other soft tissue, unspecified: Secondary | ICD-10-CM

## 2018-11-01 HISTORY — DX: Excessive and frequent menstruation with regular cycle: N92.0

## 2018-11-01 NOTE — Progress Notes (Signed)
Patient ID: [name], female   DOB: 10-29-1985, 33 y.o.   MRN: [id number]  Chief Complaint  Patient presents with  . Gynecologic Exam  pain and heavy menses  HPI [name] is a 33 y.o. female.  [id number] Patient's last menstrual period was 10/28/2018 (exact date). She had her 3rd CS and BTL 11/05/17. She has had heavy painful menses with clots and 7 days of flow since delivery. She did not tolerate IUD or OCP in the past. [place] 04/2017 showed 4 cm fibroid. HPI  Past Medical History:  Diagnosis Date  . Alcohol abuse   . Anemia   . Asthma    rarely uses inhaler  . Bipolar disorder ([place])    HX PPD after 2014 delivery, no meds  . Bronchitis   . 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 Fibroid   . Hypertension   . Missed ab    x 2, one requiring D & E  . Pancreatic cyst   . Pancreatitis    diet controlled  . Pelvic inflammatory disease   . Plantar fasciitis   . Polycystic ovary syndrome   . Renal cyst   . Sickle cell trait [facility])     Past Surgical History:  Procedure Laterality Date  . CESAREAN SECTION N/A 05/23/2012   Procedure: CESAREAN SECTION;  Surgeon: Lahoma Crocker, MD;  Location: [facility];  Service: Obstetrics;  Laterality: N/A;  primary  . CESAREAN SECTION N/A 04/18/2014   Procedure: REPEAT CESAREAN SECTION;  Surgeon: Shelly Bombard, MD;  Location: [facility];  Service: Obstetrics;  Laterality: N/A;  . CESAREAN SECTION N/A 11/05/2017   Procedure: REPEAT CESAREAN SECTION;  Surgeon: Woodroe Mode, MD;  Location: [facility];  Service: Obstetrics;  Laterality: N/A;  . DILATION AND CURETTAGE OF UTERUS      for MAB  . EUS N/A 06/11/2015   Procedure: UPPER ENDOSCOPIC ULTRASOUND (EUS) RADIAL;  Surgeon: Milus Banister, MD;  Location: [facility];  Service: Endoscopy;  Laterality: N/A;  . PANCREAS SURGERY  2012   s/p partial pancreatectomy  . SPLENECTOMY, TOTAL  2012  . WISDOM TOOTH EXTRACTION       Family History  Problem Relation Age of Onset  . Diabetes Mother   . Hypertension Mother   . Asthma Father   . Colon cancer Neg Hx     Social History Social History   Tobacco Use  . Smoking status: Current Every Day Smoker    Packs/day: 0.25    Years: 13.00    Pack years: 3.25    Types: Cigarettes  . Smokeless tobacco: Never Used  . Tobacco comment: smokes 4 cigs/day, tobacco info given 04/17/15  Substance Use Topics  . Alcohol use: No    Alcohol/week: 0.0 standard drinks    Comment: social   . Drug use: No    Allergies  Allergen Reactions  . Dilaudid [Hydromorphone Hcl] Itching and Other (See Comments)    Reaction:  Hallucinations    Current Outpatient Medications  Medication Sig Dispense Refill  . acetaminophen (TYLENOL) 500 MG tablet Take 500-1,000 mg by mouth every 6 (six) hours as needed for mild pain, moderate pain or headache.     . lipase/protease/amylase (CREON) 12000 units CPEP capsule Take 36,000 Units by mouth 3 (three) times daily before meals.    . metFORMIN (GLUCOPHAGE) 500 MG tablet Take 1 tablet (500 mg total) by mouth 2 (two) times daily. 60 tablet 5  .  oxyCODONE-acetaminophen (PERCOCET/ROXICET) 5-325 MG tablet Take 1-2 tablets by mouth every 8 (eight) hours as needed for severe pain. 30 tablet 0  . Prenatal Vit-Fe Fumarate-FA (PRENATAL MULTIVITAMIN) TABS tablet Take 1 tablet by mouth daily at 12 noon.    . methocarbamol (ROBAXIN) 500 MG tablet Take 1 tablet (500 mg total) by mouth 2 (two) times daily. 20 tablet 0   No current facility-administered medications for this visit.     Review of Systems Review of Systems  Gastrointestinal: Positive for abdominal pain (bulge and pain at umbilicus) and vomiting (during menses).  Genitourinary: Positive for menstrual problem and pelvic pain. Negative for vaginal bleeding and vaginal discharge.    Blood pressure 116/81, pulse 73, temperature 99 F (37.2 C), height 5' (1.524 m), weight 156 lb 3.2 oz  (70.9 kg), last menstrual period 10/28/2018, currently breastfeeding.  Physical Exam Physical Exam Constitutional:      Appearance: Normal appearance.  Neck:     Musculoskeletal: Normal range of motion.  Cardiovascular:     Rate and Rhythm: Normal rate.     Pulses: Normal pulses.  Pulmonary:     Effort: Pulmonary effort is normal.  Abdominal:     General: There is no distension.     Palpations: There is no mass.     Tenderness: There is no abdominal tenderness.     Hernia: No hernia is present.  Skin:    General: Skin is warm and dry.  Neurological:     Mental Status: She is alert.  Psychiatric:        Mood and Affect: Mood normal.        Behavior: Behavior normal.     Data Reviewed Op note, [place] reports  Assessment Menorrhagia and dysmenorrhea [name] S/P BTL  Plan Pelvic US F/U in 4 weeks May be an candidate for ablation    [name] 11/01/2018, 5:48 PM

## 2018-11-01 NOTE — Patient Instructions (Signed)
Menorrhagia Menorrhagia is when your menstrual periods are heavy or last longer than normal. Follow these instructions at home: Medicines   Take over-the-counter and prescription medicines exactly as told by your doctor. This includes iron pills.  Do not change or switch medicines without asking your doctor.  Do not take aspirin or medicines that contain aspirin 1 week before or during your period. Aspirin may make bleeding worse. General instructions  If you need to change your pad or tampon more than once every 2 hours, limit your activity until the bleeding stops.  Iron pills can cause problems when pooping (constipation). To prevent or treat pooping problems while taking prescription iron pills, your doctor may suggest that you: ? Drink enough fluid to keep your pee (urine) clear or pale yellow. ? Take over-the-counter or prescription medicines. ? Eat foods that are high in fiber. These foods include: ? Fresh fruits and vegetables. ? Whole grains. ? Beans. ? Limit foods that are high in fat and processed sugars. This includes fried and sweet foods.  Eat healthy meals and foods that are high in iron. Foods that have a lot of iron include: ? Leafy green vegetables. ? Meat. ? Liver. ? Eggs. ? Whole grain breads and cereals.  Do not try to lose weight until your heavy bleeding has stopped and you have normal amounts of iron in your blood. If you need to lose weight, work with your doctor.  Keep all follow-up visits as told by your doctor. This is important. Contact a doctor if:  You soak through a pad or tampon every 1 or 2 hours, and this happens every time you have a period.  You need to use pads and tampons at the same time because you are bleeding so much.  You are taking medicine and you: ? Feel sick to your stomach (nauseous). ? Throw up (vomit). ? Have watery poop (diarrhea).  You have other problems that may be related to the medicine you are taking. Get help  right away if:  You soak through more than a pad or tampon in 1 hour.  You pass clots bigger than 1 inch (2.5 cm) wide.  You feel short of breath.  You feel like your heart is beating too fast.  You feel dizzy or you pass out (faint).  You feel very weak or tired. Summary  Menorrhagia is when your menstrual periods are heavy or last longer than normal.  Take over-the-counter and prescription medicines exactly as told by your doctor. This includes iron pills.  Contact a doctor if you soak through more than a pad or tampon in 1 hour or are passing large clots. This information is not intended to replace advice given to you by your health care provider. Make sure you discuss any questions you have with your health care provider. Document Released: 12/01/2007 Document Revised: 05/31/2017 Document Reviewed: 03/14/2016 Elsevier Patient Education  2020 [employer]  

## 2018-11-02 ENCOUNTER — Telehealth: Payer: Self-pay

## 2018-11-02 NOTE — Telephone Encounter (Signed)
Called pt to make aware of [place] appointment on 11/19/18 at 3p, to arrive at 2:45p w/ full bladder. Pt verified understanding.

## 2018-11-07 ENCOUNTER — Ambulatory Visit: Payer: Self-pay | Admitting: Physician Assistant

## 2018-11-14 ENCOUNTER — Telehealth: Payer: Self-pay | Admitting: Obstetrics & Gynecology

## 2018-11-14 NOTE — Telephone Encounter (Signed)
Called the patient to inform of the upcoming appointment. The patient stated she needed the latest appointment we have available due to her situation with her children. She stated she it would be very difficult for her to come in at the earlier times available.

## 2018-11-15 ENCOUNTER — Ambulatory Visit (HOSPITAL_COMMUNITY): Payer: Medicaid Other

## 2018-11-19 ENCOUNTER — Ambulatory Visit (HOSPITAL_COMMUNITY): Payer: Medicaid Other

## 2018-11-28 ENCOUNTER — Ambulatory Visit (HOSPITAL_COMMUNITY): Payer: Medicaid Other

## 2018-11-28 DIAGNOSIS — H18413 Arcus senilis, bilateral: Secondary | ICD-10-CM | POA: Diagnosis not present

## 2018-11-28 DIAGNOSIS — E119 Type 2 diabetes mellitus without complications: Secondary | ICD-10-CM | POA: Diagnosis not present

## 2018-11-28 DIAGNOSIS — H04123 Dry eye syndrome of bilateral lacrimal glands: Secondary | ICD-10-CM | POA: Diagnosis not present

## 2018-11-28 DIAGNOSIS — H0520 Unspecified exophthalmos: Secondary | ICD-10-CM | POA: Diagnosis not present

## 2018-11-28 DIAGNOSIS — H33321 Round hole, right eye: Secondary | ICD-10-CM | POA: Diagnosis not present

## 2018-11-28 DIAGNOSIS — H40013 Open angle with borderline findings, low risk, bilateral: Secondary | ICD-10-CM | POA: Diagnosis not present

## 2018-12-03 ENCOUNTER — Encounter (INDEPENDENT_AMBULATORY_CARE_PROVIDER_SITE_OTHER): Payer: Medicaid Other | Admitting: Ophthalmology

## 2018-12-03 ENCOUNTER — Ambulatory Visit (HOSPITAL_COMMUNITY)
Admission: RE | Admit: 2018-12-03 | Discharge: 2018-12-03 | Disposition: A | Discharge status: Home / Self Care | Payer: Medicaid Other | Source: Ambulatory Visit | Attending: Obstetrics & Gynecology | Admitting: Obstetrics & Gynecology

## 2018-12-03 ENCOUNTER — Other Ambulatory Visit: Payer: Self-pay

## 2018-12-03 DIAGNOSIS — D219 Benign neoplasm of connective and other soft tissue, unspecified: Secondary | ICD-10-CM | POA: Insufficient documentation

## 2018-12-03 DIAGNOSIS — N920 Excessive and frequent menstruation with regular cycle: Secondary | ICD-10-CM | POA: Diagnosis not present

## 2018-12-05 ENCOUNTER — Ambulatory Visit (INDEPENDENT_AMBULATORY_CARE_PROVIDER_SITE_OTHER): Payer: Medicaid Other | Admitting: Obstetrics & Gynecology

## 2018-12-05 ENCOUNTER — Encounter: Payer: Self-pay | Admitting: Obstetrics & Gynecology

## 2018-12-05 ENCOUNTER — Other Ambulatory Visit: Payer: Self-pay

## 2018-12-05 VITALS — BP 128/89 | HR 74 | Wt 155.8 lb

## 2018-12-05 DIAGNOSIS — R32 Unspecified urinary incontinence: Secondary | ICD-10-CM | POA: Diagnosis not present

## 2018-12-05 DIAGNOSIS — R102 Pelvic and perineal pain: Secondary | ICD-10-CM

## 2018-12-05 DIAGNOSIS — G8929 Other chronic pain: Secondary | ICD-10-CM

## 2018-12-05 DIAGNOSIS — N920 Excessive and frequent menstruation with regular cycle: Secondary | ICD-10-CM

## 2018-12-05 DIAGNOSIS — R8271 Bacteriuria: Secondary | ICD-10-CM | POA: Diagnosis not present

## 2018-12-05 MED ORDER — TRANEXAMIC ACID 650 MG PO TABS: 1300.0000 mg | ORAL_TABLET | Freq: Three times a day (TID) | ORAL | 2 refills | Status: DC

## 2018-12-05 NOTE — Patient Instructions (Signed)
Diagnostic Laparoscopy Diagnostic laparoscopy is a procedure to diagnose diseases in the abdomen. It might be done for a variety of reasons, such as to look for scar tissue, cancer, or a reason for abdomen (abdominal) pain. During the procedure, a thin, flexible tube that has a light and a camera on the end (laparoscope) is inserted through an incision in the abdomen. The image from the camera is shown on a monitor to help your surgeon see inside your body. Tell a health care provider about:  Any allergies you have.  All medicines you are taking, including vitamins, herbs, eye drops, creams, and over-the-counter medicines.  Any problems you or family members have had with anesthetic medicines.  Any blood disorders you have.  Any surgeries you have had.  Any medical conditions you have. What are the risks? Generally, this is a safe procedure. However, problems may occur, including:  Infection.  Bleeding.  Allergic reactions to medicines or dyes.  Damage to abdominal structures or organs, such as the intestines, liver, stomach, or spleen. What happens before the procedure? Medicines  Ask your health care provider about: ? Changing or stopping your regular medicines. This is especially important if you are taking diabetes medicines or blood thinners. ? Taking medicines such as aspirin and ibuprofen. These medicines can thin your blood. Do not take these medicines unless your health care provider tells you to take them. ? Taking over-the-counter medicines, vitamins, herbs, and supplements.  You may be given antibiotic medicine to help prevent infection. Staying hydrated Follow instructions from your health care provider about hydration, which may include:  Up to 2 hours before the procedure - you may continue to drink clear liquids, such as water, clear fruit juice, black coffee, and plain tea. Eating and drinking restrictions Follow instructions from your health care provider  about eating and drinking, which may include:  8 hours before the procedure - stop eating heavy meals or foods such as meat, fried foods, or fatty foods.  6 hours before the procedure - stop eating light meals or foods, such as toast or cereal.  6 hours before the procedure - stop drinking milk or drinks that contain milk.  2 hours before the procedure - stop drinking clear liquids. General instructions  Ask your health care provider how your surgical site will be marked or identified.  You may be asked to shower with a germ-killing soap.  Plan to have someone take you home from the hospital or clinic.  Plan to have a responsible adult care for you for at least 24 hours after you leave the hospital or clinic. This is important. What happens during the procedure?   To lower your risk of infection: ? Your health care team will wash or sanitize their hands. ? Hair may be removed from the surgical area. ? Your skin will be washed with soap.  An IV will be inserted into one of your veins.  You will be given a medicine to make you fall asleep (general anesthetic). You may also be given a medicine to help you relax (sedative).  A breathing tube will be placed down your throat to help you breathe during the procedure.  Your abdomen will be filled with an air-like gas so it expands. This will give the surgeon more room to operate and will make your organs easier to see.  Many small incisions will be made in your abdomen.  A laparoscope and other surgical instruments will be inserted into your abdomen through the   incisions.  A tissue sample may be removed from an organ for examination (biopsy). This will depend on the reason why you are having this procedure.  The laparoscope and other instruments will be removed from your abdomen.  The gas will be released.  Your incisions will be closed with stitches (sutures) and covered with a bandage (dressing).  Your breathing tube will be  removed. The procedure may vary among health care providers and hospitals. What happens after the procedure?   Your blood pressure, heart rate, breathing rate, and blood oxygen level will be monitored until the medicines you were given have worn off.  Do not drive for 24 hours if you were given a sedative during your procedure.  It is up to you to get the results of your procedure. Ask your health care provider, or the department that is doing the procedure, when your results will be ready. Summary  Diagnostic laparoscopy is a way to look for problems in the abdomen using small incisions.  Follow instructions from your health care provider about how to prepare for the procedure.  Plan to have a responsible adult care for you for at least 24 hours after you leave the hospital or clinic. This is important. This information is not intended to replace advice given to you by your health care provider. Make sure you discuss any questions you have with your health care provider. Document Released: 05/30/2000 Document Revised: 02/03/2017 Document Reviewed: 08/17/2016 Elsevier Patient Education  2020 [employer].   Endometrial Ablation Endometrial ablation is a procedure that destroys the thin inner layer of the lining of the uterus (endometrium). This procedure may be done:  To stop heavy periods.  To stop bleeding that is causing anemia.  To control irregular bleeding.  To treat bleeding caused by small tumors (fibroids) in the endometrium. This procedure is often an alternative to major surgery, such as removal of the uterus and cervix (hysterectomy). As a result of this procedure:  You may not be able to have children. However, if you are premenopausal (you have not gone through menopause): ? You may still have a small chance of getting pregnant. ? You will need to use a reliable method of birth control after the procedure to prevent pregnancy.  You may stop having a menstrual  period, or you may have only a small amount of bleeding during your period. Menstruation may return several years after the procedure. Tell a health care provider about:  Any allergies you have.  All medicines you are taking, including vitamins, herbs, eye drops, creams, and over-the-counter medicines.  Any problems you or family members have had with the use of anesthetic medicines.  Any blood disorders you have.  Any surgeries you have had.  Any medical conditions you have. What are the risks? Generally, this is a safe procedure. However, problems may occur, including:  A hole (perforation) in the uterus or bowel.  Infection of the uterus, bladder, or vagina.  Bleeding.  Damage to other structures or organs.  An air bubble in the lung (air embolus).  Problems with pregnancy after the procedure.  Failure of the procedure.  Decreased ability to diagnose cancer in the endometrium. What happens before the procedure?  You will have tests of your endometrium to make sure there are no pre-cancerous cells or cancer cells present.  You may have an ultrasound of the uterus.  You may be given medicines to thin the endometrium.  Ask your health care provider about: ? Changing  or stopping your regular medicines. This is especially important if you take diabetes medicines or blood thinners. ? Taking medicines such as aspirin and ibuprofen. These medicines can thin your blood. Do not take these medicines before your procedure if your doctor tells you not to.  Plan to have someone take you home from the hospital or clinic. What happens during the procedure?   You will lie on an exam table with your feet and legs supported as in a pelvic exam.  To lower your risk of infection: ? Your health care team will wash or sanitize their hands and put on germ-free (sterile) gloves. ? Your genital area will be washed with soap.  An IV tube will be inserted into one of your veins.  You  will be given a medicine to help you relax (sedative).  A surgical instrument with a light and camera (resectoscope) will be inserted into your vagina and moved into your uterus. This allows your surgeon to see inside your uterus.  Endometrial tissue will be removed using one of the following methods: ? Radiofrequency. This method uses a radiofrequency-alternating electric current to remove the endometrium. ? Cryotherapy. This method uses extreme cold to freeze the endometrium. ? Heated-free liquid. This method uses a heated saltwater (saline) solution to remove the endometrium. ? Microwave. This method uses high-energy microwaves to heat up the endometrium and remove it. ? Thermal balloon. This method involves inserting a catheter with a balloon tip into the uterus. The balloon tip is filled with heated fluid to remove the endometrium. The procedure may vary among health care providers and hospitals. What happens after the procedure?  Your blood pressure, heart rate, breathing rate, and blood oxygen level will be monitored until the medicines you were given have worn off.  As tissue healing occurs, you may notice vaginal bleeding for 4-6 weeks after the procedure. You may also experience: ? Cramps. ? Thin, watery vaginal discharge that is light pink or brown in color. ? A need to urinate more frequently than usual. ? Nausea.  Do not drive for 24 hours if you were given a sedative.  Do not have sex or insert anything into your vagina until your health care provider approves. Summary  Endometrial ablation is done to treat the many causes of heavy menstrual bleeding.  The procedure may be done only after medications have been tried to control the bleeding.  Plan to have someone take you home from the hospital or clinic. This information is not intended to replace advice given to you by your health care provider. Make sure you discuss any questions you have with your health care provider.  Document Released: 01/01/2004 Document Revised: 08/08/2017 Document Reviewed: 03/10/2016 Elsevier Patient Education  2020 [employer].

## 2018-12-05 NOTE — Progress Notes (Signed)
GYNECOLOGY OFFICE VISIT NOTE  History:   [name] is a 33 y.o. [phone] here today for follow up for report of menorrhagia with associated severe pelvic pain that has occurred since her last cesarean section in 11/2017.  Has very heavy bleeding and severe pelvic and back pain interfering with her life and debilitating enough to need occasional narcotic therapy. Unable to take NSAIDs due to history of pancreatitis. Bad experience with OCPs, progestin pills and IUD in the past, wants to avoid hormonal management.  Thought she had big fibroids, but recent ultrasound (see below) only showed 1.2 cm fibroid.  She is very tearful, does not understand why she is in so much pain.  Also says she has urinary incontinence since her last cesarean section, she is unable to hold her urine and wants evaluation for this . She denies any current abnormal vaginal discharge, bleeding, pelvic pain or other concerns.    Past Medical History:  Diagnosis Date  . Alcohol abuse   . Anemia   . Asthma    rarely uses inhaler  . Bipolar disorder ([place])    HX PPD after 2014 delivery, no meds  . Bronchitis   . 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 Fibroid   . Hypertension   . Missed ab    x 2, one requiring D & E  . Pancreatic cyst   . Pancreatitis    diet controlled  . Pelvic inflammatory disease   . Plantar fasciitis   . Polycystic ovary syndrome   . Renal cyst   . Sickle cell trait [facility])     Past Surgical History:  Procedure Laterality Date  . CESAREAN SECTION N/A 05/23/2012   Procedure: CESAREAN SECTION;  Surgeon: Lahoma Crocker, MD;  Location: [facility];  Service: Obstetrics;  Laterality: N/A;  primary  . CESAREAN SECTION N/A 04/18/2014   Procedure: REPEAT CESAREAN SECTION;  Surgeon: Shelly Bombard, MD;  Location: [facility];  Service: Obstetrics;  Laterality: N/A;  . CESAREAN SECTION N/A 11/05/2017   Procedure: REPEAT CESAREAN SECTION;  Surgeon:  Woodroe Mode, MD;  Location: [place];  Service: Obstetrics;  Laterality: N/A;  . DILATION AND CURETTAGE OF UTERUS      for MAB  . EUS N/A 06/11/2015   Procedure: UPPER ENDOSCOPIC ULTRASOUND (EUS) RADIAL;  Surgeon: Milus Banister, MD;  Location: [facility];  Service: Endoscopy;  Laterality: N/A;  . PANCREAS SURGERY  2012   s/p partial pancreatectomy  . SPLENECTOMY, TOTAL  2012  . WISDOM TOOTH EXTRACTION      The following portions of the patient's history were reviewed and updated as appropriate: allergies, current medications, past family history, past medical history, past social history, past surgical history and problem list.   Health Maintenance:  Normal pap and negative HRHPV on 10/27/2015.   Review of Systems:  Pertinent items noted in HPI and remainder of comprehensive ROS otherwise negative.  Physical Exam:  BP 128/89   Pulse 74   Wt 155 lb 12.8 oz (70.7 kg)   BMI 30.43 kg/m  CONSTITUTIONAL: Well-developed, well-nourished female in no acute distress.  HEENT:  Normocephalic, atraumatic. External right and left ear normal. No scleral icterus.  NECK: Normal range of motion, supple, no masses noted on observation SKIN: No rash noted. Not diaphoretic. No erythema. No pallor. MUSCULOSKELETAL: Normal range of motion. No edema noted. NEUROLOGIC: Alert and oriented to person, place, and time. Normal muscle tone coordination. No cranial  nerve deficit noted. PSYCHIATRIC: Normal mood and affect. Normal behavior. Normal judgment and thought content. CARDIOVASCULAR: Normal heart rate noted RESPIRATORY: Effort and breath sounds normal, no problems with respiration noted ABDOMEN: No masses noted. No other overt distention noted.   PELVIC: Deferred  Labs and Imaging US Pelvic Complete With Transvaginal  Result Date: 12/03/2018 CLINICAL DATA:  Menorrhagia EXAM: TRANSABDOMINAL AND TRANSVAGINAL ULTRASOUND OF PELVIS TECHNIQUE: Both transabdominal and transvaginal ultrasound  examinations of the pelvis were performed. Transabdominal technique was performed for global imaging of the pelvis including uterus, ovaries, adnexal regions, and pelvic cul-de-sac. It was necessary to proceed with endovaginal exam following the transabdominal exam to visualize the endometrium. COMPARISON:  None FINDINGS: Uterus Measurements: 9.6 x 5.8 x 6.2 cm = volume: 179.5 mL. There is a possible 1.2 x 0.8 x 0.9 cm intramural fibroid within the anterior right uterine fundus. Endometrium Thickness: 8 mm. No focal abnormality visualized. Small amount of peripheral calcifications. Right ovary Measurements: 4.1 x 2.5 x 3.3 cm = volume: 17.7 mL. Normal appearance/no adnexal mass. Left ovary Measurements: 4.1 x 1.8 x 3.0 cm = volume: 11.7 mL. Normal appearance/no adnexal mass. Other findings No abnormal free fluid. IMPRESSION: Endometrium measures 8 mm. If bleeding remains unresponsive to hormonal or medical therapy, sonohysterogram should be considered for focal lesion work-up. (Ref: Radiological Reasoning: Algorithmic Workup of Abnormal Vaginal Bleeding with Endovaginal Sonography and Sonohysterography. [id number]) Probable small fibroid. Electronically Signed   By: Lovey Newcomer M.D.   On: 12/03/2018 16:52      Assessment and Plan:      1. Menorrhagia with regular cycle Recommended trial of Lysteda for her menorrhagia for now, and recommended endometrial ablation for long term management.  Hysterectomy also offered as alternative surgical management, she does not want this for now. Will research Lysteda and take if she decides to do so. Desires ablation, information given to her to review.  Will schedule for this, see more below. - tranexamic acid (LYSTEDA) 650 MG TABS tablet; Take 2 tablets (1,300 mg total) by mouth 3 (three) times daily. Take during menses for a maximum of five days  Dispense: 30 tablet; Refill: 2  2. Chronic female pelvic pain Unclear etiology of her pain; she only has a 1.2 cm  fibroid.  Could be adhesive disease after three previous cesarean sections, post-tubal syndrome.  Endometriosis is less likely but not impossible; could also be due to nerves in the pelvis or from other surrounding organs.  Diagnostic laparoscopy recommended, she agreed with this plan.  Will add on endometrial ablation.   Procedures: Diagnostic laparoscopy, [place] Endometrial ablation Diagnoses: Pelvic pain, Abnormal uterine bleedng  The risks of surgery discussed with the patient in detail, all questions answered.  Will discuss further at preoperative visit once surgery is scheduled.  Printed patient education handouts about the procedure were given to the patient to review at home.  3. Incontinence of urine in female Unclear etiology, sounds like mixed incontinence. Referred to Urology, culture sent to rule out infection. - Urine Culture - Ambulatory referral to Urology  Routine preventative health maintenance measures emphasized. Please refer to After Visit Summary for other counseling recommendations.   Return for any gynecologic concerns.    Total face-to-face time with patient: 25 minutes.  Over 50% of encounter was spent on counseling and coordination of care.   Verita Schneiders, MD, [place] [facility] [employer], [place]

## 2018-12-06 NOTE — Progress Notes (Signed)
[facility] Note  12/07/2018     CHIEF COMPLAINT Patient presents for Retina Evaluation   HISTORY OF PRESENT ILLNESS: [name] is a 33 y.o. female who presents to the clinic today for:   HPI    Retina Evaluation    In right eye.  This started 2 months ago.  Duration of 2 weeks.  Associated Symptoms Floaters, Photophobia and Glare.  Context:  distance vision, mid-range vision, near vision, driving and night driving.  Treatments tried include no treatments.  I, the attending physician,  performed the HPI with the patient and updated documentation appropriately.          Comments    33 y/o female pt referred by Dr. Shirley Muscat for eval of ret hole OD.  Saw Dr. Shirley Muscat about 2 wks ago.  Pt has been having floaters OD for about 2 mos.  Pt unsure of exact location of ret hole.  VA excellent OU [place].  Denies pain, flashes, but c/o great difficulty w/glare and photophobia, especially at night due to early cataracts.  Pt is diabetic following a pancreatic sx 8 yrs ago.  BS this a.m. before breakfast 130.  A1C 5.3 in March of 2020.  No gtts, but Dr. Shirley Muscat gave her an rx for Restasis.       Last edited by [name], MD on 12/07/2018  9:46 AM. (History)    pt states she saw Dr. Shirley Muscat for routine eye exam and also bc she could see a floater in her right eye, she states Dr. Shirley Muscat was concerned about a hole in the back of her eye, she denies history of past eye problems or sx, pt states Dr. 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 Shirley Muscat for possible retinal hole OD  Last eye exam: 09.23.20  BCVA: OD 20/20+1, OS 20/20  Medical history: DM, HTN, bronchitis, arthritis  Last a1c: 5.6 on 02.25.20   CURRENT MEDICATIONS: Current Outpatient Medications (Ophthalmic Drugs)  Medication Sig  . prednisoLONE acetate (PRED FORTE) 1 % ophthalmic suspension Place 1 drop into the left eye 4 (four) times daily for 7 days.   No current facility-administered medications for this visit.  (Ophthalmic Drugs)   Current Outpatient Medications (Other)  Medication Sig  . acetaminophen (TYLENOL) 500 MG tablet Take 500-1,000 mg by mouth every 6 (six) hours as needed for mild pain, moderate pain or headache.   . cefadroxil (DURICEF) 500 MG capsule Take 1 capsule (500 mg total) by mouth 2 (two) times daily for 5 days.  Marland Kitchen lipase/protease/amylase (CREON) 12000 units CPEP capsule Take 36,000 Units by mouth 3 (three) times daily before meals.  . metFORMIN (GLUCOPHAGE) 500 MG tablet Take 1 tablet (500 mg total) by mouth 2 (two) times daily.  . methocarbamol (ROBAXIN) 500 MG tablet Take 1 tablet (500 mg total) by mouth 2 (two) times daily.  Marland Kitchen oxyCODONE-acetaminophen (PERCOCET/ROXICET) 5-325 MG tablet Take 1-2 tablets by mouth every 8 (eight) hours as needed for severe pain.  . Prenatal Vit-Fe Fumarate-FA (PRENATAL MULTIVITAMIN) TABS  tablet Take 1 tablet by mouth daily at 12 noon.  . tranexamic acid (LYSTEDA) 650 MG TABS tablet Take 2 tablets (1,300 mg total) by mouth 3 (three) times daily. Take during menses for a maximum of five days   No current facility-administered medications for this visit.  (Other)      REVIEW OF SYSTEMS: ROS    Positive for: Endocrine, Eyes, Respiratory, Psychiatric   Negative for: Constitutional, Gastrointestinal, Neurological, Skin, Genitourinary, Musculoskeletal, HENT, Cardiovascular, Allergic/Imm, Heme/Lymph   Last edited by Matthew Folks, COA on 12/07/2018  8:31 AM. (History)        ALLERGIES Allergies  Allergen Reactions  . Dilaudid [Hydromorphone Hcl] Itching and Other (See Comments)    Reaction:  Hallucinations    PAST MEDICAL HISTORY Past Medical History:  Diagnosis Date  . Alcohol abuse   . Anemia   . Asthma    rarely uses inhaler  . Bipolar disorder ([place])    HX PPD after 2014 delivery, no meds  . Bronchitis   . Diabetes mellitus without complication ([place])    partial pancreatectomy 2012-glyburide  . Fibroid   . Hypertension   . Missed ab    x 2, one requiring D & E  . Pancreatic cyst   . Pancreatitis    diet controlled  . Pelvic inflammatory disease   . Plantar fasciitis   . Polycystic ovary syndrome   . Renal cyst   . Sickle cell trait [facility])    Past Surgical History:  Procedure Laterality Date  . CESAREAN SECTION N/A 05/23/2012   Procedure: CESAREAN SECTION;  Surgeon: Lahoma Crocker, MD;  Location: [facility];  Service: Obstetrics;  Laterality: N/A;  primary  . CESAREAN SECTION N/A 04/18/2014   Procedure: REPEAT CESAREAN SECTION;  Surgeon: Shelly Bombard, MD;  Location: [facility];  Service: Obstetrics;  Laterality: N/A;  . CESAREAN SECTION N/A 11/05/2017   Procedure: REPEAT CESAREAN SECTION;  Surgeon: Woodroe Mode, MD;  Location: [facility];  Service: Obstetrics;  Laterality: N/A;  . DILATION AND CURETTAGE OF UTERUS      for MAB  . EUS N/A 06/11/2015   Procedure: UPPER ENDOSCOPIC ULTRASOUND (EUS) RADIAL;  Surgeon: Milus Banister, MD;  Location: [facility];  Service: Endoscopy;  Laterality: N/A;  . PANCREAS SURGERY  2012   s/p partial pancreatectomy  . SPLENECTOMY, TOTAL  2012  . WISDOM TOOTH EXTRACTION      FAMILY HISTORY Family History  Problem Relation Age of Onset  . Diabetes Mother   . Hypertension Mother   . Asthma Father   . Glaucoma Sister   . Glaucoma Paternal Grandmother   . Colon cancer Neg Hx     SOCIAL HISTORY Social History   Tobacco Use  . Smoking status: Current Every Day Smoker    Packs/day:  0.25    Years: 13.00    Pack years: 3.25    Types: Cigarettes  . Smokeless tobacco: Never Used  . Tobacco comment: smokes 4 cigs/day, tobacco info given 04/17/15  Substance Use Topics  . Alcohol use: No    Alcohol/week: 0.0 standard drinks    Comment: social   . Drug use: No         OPHTHALMIC EXAM:  Base Eye Exam    Visual Acuity (Snellen - Linear)      Right Left   Dist Nadine 20/20 20/20       Tonometry (Tonopen, 8:35 AM)      Right Left   Pressure 21  19       Pupils      Dark Light Shape React APD   Right 4 3 Round Brisk None   Left 4 3 Round Brisk None       Visual Fields (Counting fingers)      Left Right    Full Full       Extraocular Movement      Right Left    Full, Ortho Full, Ortho       Neuro/Psych    Oriented x3: Yes   Mood/Affect: Normal       Dilation    Both eyes: 1.0% Mydriacyl, 2.5% Phenylephrine @ 8:35 AM        Slit Lamp and Fundus Exam    Slit Lamp Exam      Right Left   Lids/Lashes Normal Normal   Conjunctiva/Sclera White and quiet White and quiet   Cornea trace Punctate epithelial erosions 2+Punctate epithelial erosions   Anterior Chamber Deep and quiet Deep and quiet   Iris Round and dilated Round and dilated   Lens Clear Clear   Vitreous Mild Vitreous syneresis, focal vitreous condensations at 0300 Mild Vitreous syneresis       Fundus Exam      Right Left   Disc Pink and Sharp Pink and Sharp   C/D Ratio 0.4 0.5   Macula Flat, Good foveal reflex, No heme or edema Flat, Good foveal reflex, No heme or edema   Vessels Mild Copper wiring, mild Tortuous Mild Copper wiring, mild Tortuous   Periphery Attached, very prominent vitreous base 360, pigmented operculated hole at 0600--no SRF,  focal vitreous condensations at 0300, no RT/RD on 360 scleral depression Attached, prominent vitreous base 360, 2 adjacent atrophic holes at 0600, no SRF        Refraction    Manifest Refraction      Sphere Cylinder Dist VA   Right [place]  Sphere 20/20   Left [place] Sphere 20/20          IMAGING AND PROCEDURES  Imaging and Procedures for '@TODAY'$ @  OCT, Retina - OU - Both Eyes       Right Eye Quality was good. Central Foveal Thickness: 248. Progression has no prior data. Findings include normal foveal contour, no IRF, no SRF.   Left Eye Quality was good. Central Foveal Thickness: 249. Progression has no prior data. Findings include normal foveal contour, no IRF, no SRF.   Notes *Images captured and stored on drive  Diagnosis / Impression:  NFP, no IRF/SRF OU  Clinical management:  See below  Abbreviations: NFP - Normal foveal profile. CME - cystoid macular edema. PED - pigment epithelial detachment. IRF - intraretinal fluid. SRF - subretinal fluid. EZ - ellipsoid zone. ERM - epiretinal membrane. ORA - outer retinal atrophy. ORT - outer retinal tubulation. SRHM - subretinal hyper-reflective material        Repair Retinal Breaks, Laser - OS - Left Eye       LASER PROCEDURE NOTE  Procedure:  Barrier laser retinopexy using slit lamp laser, LEFT eye   Diagnosis:   Retinal holes, LEFT eye                     2 round atrophic holes at 6 o'clock anterior to equator   Surgeon: [name], MD, PhD  Anesthesia: Topical  Informed consent obtained, operative eye marked, and time out performed prior to initiation of laser.   Laser settings:  Lumenis [id number] laser,  slit lamp Lens: Mainster PRP 165 Power: 230 mW Spot size: 200 microns Duration: 30 msec  # spots: 146  Placement of laser: Using a Mainster PRP 165 contact lens at the slit lamp, laser was placed in three confluent rows around retinal holes at 6 oclock anterior to equator with additional rows anteriorly.  Complications: None.  Patient tolerated the procedure well and received written and verbal post-procedure care information/education.                  ASSESSMENT/PLAN:    ICD-10-CM   1. Retinal holes, left  H33.322 Repair  Retinal Breaks, Laser - OS - Left Eye  2. Diabetes mellitus type 2 without retinopathy ([place])  E11.9   3. Essential hypertension  I10   4. Hypertensive retinopathy of both eyes  H35.033   5. Retinal edema  H35.81 OCT, Retina - OU - Both Eyes    1. Retinal holes OS  - The incidence, risk factors, and natural history of retinal hole was discussed with patient.    - Potential treatment options including laser retinopexy and cryotherapy discussed with patient.  - 2 adjacent atrophic holes located at 0600, no SRF  - recommend laser retinopexy OS today, 10.02.20  - RBA of procedure discussed, questions answered  - informed consent obtained and signed  - see procedure note  - start PF OS QID x7 days  - f/u in 1-2 wks  2. Diabetes mellitus, type 2 without retinopathy  - The incidence, risk factors for progression, natural history and treatment options for diabetic retinopathy  were discussed with patient.    - The need for close monitoring of blood glucose, blood pressure, and serum lipids, avoiding cigarette or any type of tobacco, and the need for long term follow up was also discussed with patient.  - f/u in 1 year, sooner prn   3,4. Hypertensive retinopathy OU  - discussed importance of tight BP control  - monitor  5. No retinal edema on exam or OCT   Ophthalmic Meds Ordered this visit:  Meds ordered this encounter  Medications  . prednisoLONE acetate (PRED FORTE) 1 % ophthalmic suspension    Sig: Place 1 drop into the left eye 4 (four) times daily for 7 days.    Dispense:  10 mL    Refill:  0       Return for f/u retinal hole OS, DFE, OCT.  There are no Patient Instructions on file for this visit.   This document serves as a record of services personally performed by Gardiner Sleeper, MD, PhD. It was created on their behalf by Roselee Nova, COMT. The creation of this record is the provider's dictation and/or activities during the visit.  Electronically signed by: Roselee Nova, COMT 12/07/18 12:49 PM   This document serves as a record of services personally performed by Gardiner Sleeper, MD, PhD. It was created on their behalf by Ernest Mallick, OA, an ophthalmic assistant. The creation of this record is the provider's dictation and/or activities during the visit.    Electronically signed by: Ernest Mallick, OA  10.02.2020 12:49 PM     Explained the diagnoses, plan, and follow up with the patient and they expressed understanding.  Patient expressed understanding of the importance of proper follow up care.   Gardiner Sleeper, M.D., Ph.D. Diseases & Surgery of the Retina and Vitreous [facility]  I have reviewed the above documentation for accuracy and completeness, and I  agree with the above. Gardiner Sleeper, M.D., Ph.D. 12/07/18 12:49 PM     Abbreviations: M myopia (nearsighted); A astigmatism; H hyperopia (farsighted); P presbyopia; Mrx spectacle prescription;  CTL contact lenses; OD right eye; OS left eye; OU both eyes  XT exotropia; ET esotropia; PEK punctate epithelial keratitis; PEE punctate epithelial erosions; DES dry eye syndrome; MGD meibomian gland dysfunction; ATs artificial tears; PFAT's preservative free artificial tears; [place] nuclear sclerotic cataract; PSC posterior subcapsular cataract; ERM epi-retinal membrane; PVD posterior vitreous detachment; RD retinal detachment; DM diabetes mellitus; DR diabetic retinopathy; NPDR non-proliferative diabetic retinopathy; PDR proliferative diabetic retinopathy; CSME clinically significant macular edema; DME diabetic macular edema; dbh dot blot hemorrhages; CWS cotton wool spot; POAG primary open angle glaucoma; C/D cup-to-disc ratio; HVF humphrey visual field; GVF goldmann visual field; OCT optical coherence tomography; IOP intraocular pressure; BRVO Branch retinal vein occlusion; CRVO central retinal vein occlusion; CRAO central retinal artery occlusion; BRAO branch retinal artery occlusion;  RT retinal tear; SB scleral buckle; PPV pars plana vitrectomy; VH Vitreous hemorrhage; PRP panretinal laser photocoagulation; IVK intravitreal kenalog; VMT vitreomacular traction; MH Macular hole;  NVD neovascularization of the disc; NVE neovascularization elsewhere; AREDS age related eye disease study; ARMD age related macular degeneration; POAG primary open angle glaucoma; EBMD epithelial/anterior basement membrane dystrophy; ACIOL anterior chamber intraocular lens; IOL intraocular lens; PCIOL posterior chamber intraocular lens; Phaco/IOL phacoemulsification with intraocular lens placement; [place] photorefractive keratectomy; LASIK laser assisted in situ keratomileusis; HTN hypertension; DM diabetes mellitus; COPD chronic obstructive pulmonary disease

## 2018-12-07 ENCOUNTER — Other Ambulatory Visit: Payer: Self-pay

## 2018-12-07 ENCOUNTER — Encounter (INDEPENDENT_AMBULATORY_CARE_PROVIDER_SITE_OTHER): Payer: Self-pay | Admitting: Ophthalmology

## 2018-12-07 ENCOUNTER — Ambulatory Visit (INDEPENDENT_AMBULATORY_CARE_PROVIDER_SITE_OTHER): Payer: Medicaid Other | Admitting: Ophthalmology

## 2018-12-07 DIAGNOSIS — H35033 Hypertensive retinopathy, bilateral: Secondary | ICD-10-CM

## 2018-12-07 DIAGNOSIS — E119 Type 2 diabetes mellitus without complications: Secondary | ICD-10-CM | POA: Diagnosis not present

## 2018-12-07 DIAGNOSIS — H33322 Round hole, left eye: Secondary | ICD-10-CM

## 2018-12-07 DIAGNOSIS — I10 Essential (primary) hypertension: Secondary | ICD-10-CM | POA: Diagnosis not present

## 2018-12-07 DIAGNOSIS — H3581 Retinal edema: Secondary | ICD-10-CM | POA: Diagnosis not present

## 2018-12-07 LAB — URINE CULTURE

## 2018-12-07 MED ORDER — PREDNISOLONE ACETATE 1 % OP SUSP: 1.0000 [drp] | Freq: Four times a day (QID) | OPHTHALMIC | 0 refills | Status: AC

## 2018-12-07 MED ORDER — CEFADROXIL 500 MG PO CAPS: 500.0000 mg | ORAL_CAPSULE | Freq: Two times a day (BID) | ORAL | 0 refills | Status: AC

## 2018-12-07 NOTE — Addendum Note (Signed)
Addended by: Verita Schneiders A on: 12/07/2018 04:26 AM   Modules accepted: Orders

## 2018-12-18 ENCOUNTER — Telehealth (INDEPENDENT_AMBULATORY_CARE_PROVIDER_SITE_OTHER): Payer: Medicaid Other | Admitting: Lactation Services

## 2018-12-18 DIAGNOSIS — N898 Other specified noninflammatory disorders of vagina: Secondary | ICD-10-CM

## 2018-12-18 MED ORDER — FLUCONAZOLE 150 MG PO TABS: 150.0000 mg | ORAL_TABLET | Freq: Once | ORAL | 0 refills | Status: AC

## 2018-12-18 NOTE — Telephone Encounter (Signed)
Called pt to inform her that she had some bacteria in her urine and that given symptoms Dr. Harolyn Rutherford wanted to go ahead and treat.   Pt had eye surgery and has not been able to see very well. She reports she went to the pharmacy to get her other meds.and found out that she had ATB called in.   Pt reports she felt fine in the bladder since she was taking ATB. Pt reports she is having vaginal spotting and bleeding and is itching like she has a yeast infection. Pt reports she has one day of ATB left. Enc her to finish ATB and then take the Diflucan. Pt to let us know if she does not improve post Diflucan.   Pt has surgery scheduled on 10/28. She is concerned that she does not have help with her 33 yo. Dad is trying to get a local route for his job. Informed mom she will need to let us know as the surgery comes closer if she will need to cancel. Pt voiced understanding.   Pt reports she needs her pap smear done. Informed her to let front office know when she is scheduled for her post op that she needs a pap also.   Pt voiced understanding.

## 2018-12-24 ENCOUNTER — Encounter (INDEPENDENT_AMBULATORY_CARE_PROVIDER_SITE_OTHER): Payer: Medicaid Other | Admitting: Ophthalmology

## 2018-12-27 ENCOUNTER — Telehealth: Payer: Self-pay | Admitting: *Deleted

## 2018-12-27 NOTE — Telephone Encounter (Signed)
Received a request to contact patient with concerns about her visits.  As I am calling patient she is calling asking to speak with me.  Patient call transferred to my office.  Patient states she is unhappy with her visits and her overall care.  Tells me her story as listed below -  States Dr. Roselie Awkward performed her 3rd c-section with a BTS a year ago.  States when the person inserted her IV she told them she couldn't breathe.  States they told her they would fix it and they did.  States when Dr. 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  States she told Dr. Roselie Awkward she was told she couldn't have any "hard blows" to her pancreas or it could potentially damage it.  States during her c-section she felt Dr. Roselie Awkward pushing on her.  States she knows that happens sometimes to get the baby to come down.  Said her baby had to have oxygen as soon as it was delivered.  States she knew she was having BTS as she had "signed up for that".  States she told Dr. Roselie Awkward to stop "something doesn't feel right".  States she is diabetic due to her pancreatic issues.  States while she was in the hospital after delivery her blood sugar dropped to 43.  States Dr. Roselie Awkward never came back to see her in the hospital before she was discharged.  States she was only seen by CNMs and they could never answer her questions about what happened in her c-section.  States she started having symptoms of pancreatitis while in the hospital and that she told the staff that.  States they drew lipase and amylace blood work.  States she told them those would show as in the noraml range.  States the day after her discharge from the hospital she developed pancreatitis.  States she called her GI and she had a CT which "looked good" and her enzymes were elevated.  States she had many attacks of pancreatitis after discharge when this had been under control for many years.  States she  developed urinary incontinence after the c-section also.  States she was seen in the office by a CNM who couldn't answer her questions about the c-section.  States she explained she wanted to be seen by Dr. Roselie Awkward and is always told he is booked up.  States she finds this "very odd".  States she was seen by Dr. Roselie Awkward in the office and when she asked him questions he said his computer had crashed and he couldn't read his note.  States she had to tell him she had fibroids as "he knew nothing".  States she is having heavy bleeding with her cycles and in between her cycles.  States no one will listen to her and no one will answer her questions about the c-section.  States she was scheduled to see Dr. Roselie Awkward and her appointment was changed to Dr. Harolyn Rutherford.  States no one explained why her appointment was changed.  States Dr. Harolyn Rutherford couldn't answer her questions about the c-section but did offer her medication which she is not comfortable taking given her high risks.  States she was supposed to be referred to a urologist and no one has contacted her about this.  States she finds it "odd" that "something is wrong with me and he won't make time for me".  I apologized we didn't meet her  expectations.  I explained the last appointment with Dr. Roselie Awkward was moved to Dr. Harolyn Rutherford because Dr. Roselie Awkward had to go out on an unexpected leave of absence.  I explained he is back and I will schedule an appointment for her.  She states she has had to cancel her GYN surgery which was scheduled for 01/02/19 because she is having eye surgery that day.  States this (eye surgery) is more urgent and she feels she had no choice but to put off the GYN surgery.  I explained Dr. Roselie Awkward is back in the office this week.  I told her of the various days he is here in our office.  I scheduled an appointment for her on 01/16/19 at 3:15 pm with Dr. Roselie Awkward.  I also scheduled a referral to urology.  Patient was notified of appointment scheduled with Dr.  McDiarmid at [facility] on 01/28/19 at 2:45 pm.  Patient states understanding.  I again apologized for her experience.  I asked her to please let me know if she has any other difficulties.  Patient states understanding.

## 2018-12-29 ENCOUNTER — Other Ambulatory Visit (HOSPITAL_COMMUNITY): Payer: Medicaid Other

## 2019-01-01 NOTE — Progress Notes (Signed)
[facility] Note  01/02/2019     CHIEF COMPLAINT Patient presents for Retina Follow Up   HISTORY OF PRESENT ILLNESS: [name] is a 33 y.o. female who presents to the clinic today for:   HPI    Retina Follow Up    Patient presents with  Retinal Break/Detachment.  In left eye.  This started 4 weeks ago.  Severity is mild.  Duration of 4 weeks.  Since onset it is stable.  I, the attending physician,  performed the HPI with the patient and updated documentation appropriately.          Comments    33 y/o female pt here for 4 wk f/u s/p laser retinopexy for retinal holes OS on 10.2.20.  No change in [place] OU.  Denies pain, flashes, but has a few floaters OS.  Pt also feels like her OD is gradually becoming "larger" and more proptotic than OS.  She can see this in pictures of herself, and others have commented on the appearance of this as well.  Restasis BID OU.  BS 122 this a.m.  A1C 5.3.       Last edited by [name], MD on 01/02/2019  3:01 PM. (History)    pt states she did well after the laser procedure last time, she states she is seeing floaters superiorly in her left eye now   Referring physician: Calton Dach, MD [place],  [place] [id number]  HISTORICAL INFORMATION:   Selected notes from the MEDICAL RECORD NUMBER Patient being referred by Dr. Shirley Muscat for possible retinal hole OD  Last eye exam: 09.23.20  BCVA: OD 20/20+1, OS 20/20  Medical history: DM, HTN, bronchitis, arthritis  Last a1c: 5.6 on 02.25.20   CURRENT MEDICATIONS: Current Outpatient Medications (Ophthalmic Drugs)  Medication Sig  . RESTASIS 0.05 % ophthalmic emulsion INSTILL 1 DROP INTO BOTH EYES TWICE A DAY   No current facility-administered medications for this visit.  (Ophthalmic Drugs)   Current Outpatient Medications (Other)  Medication Sig  . acetaminophen (TYLENOL) 500 MG tablet Take 500-1,000 mg by mouth every 6 (six) hours as  needed for mild pain, moderate pain or headache.   . lipase/protease/amylase (CREON) 12000 units CPEP capsule Take 36,000 Units by mouth 3 (three) times daily before meals.  . metFORMIN (GLUCOPHAGE) 500 MG tablet Take 1 tablet (500 mg total) by mouth 2 (two) times daily.  [place] Kitchen oxyCODONE-acetaminophen (PERCOCET/ROXICET) 5-325 MG tablet Take 1-2 tablets by mouth every 8 (eight) hours as needed for severe pain.  . Prenatal Vit-Fe Fumarate-FA (PRENATAL MULTIVITAMIN) TABS tablet Take 1 tablet by mouth daily at 12 noon.  . tranexamic acid (LYSTEDA) 650 MG TABS tablet Take 2 tablets (1,300 mg total) by mouth 3 (three) times daily. Take during menses for a maximum of five days  . methocarbamol (ROBAXIN) 500 MG tablet Take 1 tablet (500 mg total) by mouth 2 (two) times daily.   No current facility-administered medications for this visit.  (Other)      REVIEW OF SYSTEMS: ROS    Positive for: Endocrine, Eyes, Respiratory   Negative for: Constitutional, Gastrointestinal, Neurological, Skin, Genitourinary, Musculoskeletal, HENT, Cardiovascular, Psychiatric, Allergic/Imm, Heme/Lymph   Last edited by Matthew Folks, COA on 01/02/2019  1:56 PM. (History)       ALLERGIES Allergies  Allergen Reactions  . Dilaudid [Hydromorphone Hcl] Itching and Other (See Comments)    Reaction:  Hallucinations    PAST MEDICAL HISTORY Past Medical History:  Diagnosis Date  . Alcohol abuse   . Anemia   . Asthma    rarely uses inhaler  . Bipolar disorder ([place])    HX PPD after 2014 delivery, no meds  . Bronchitis   . Diabetes mellitus without complication ([place])    partial pancreatectomy 2012-glyburide  . Fibroid   . Hypertension   . Hypertensive retinopathy    OU  . Missed ab    x 2, one requiring D & E  . Pancreatic cyst   . Pancreatitis    diet controlled  . Pelvic inflammatory disease   . Plantar fasciitis   . Polycystic ovary syndrome   . Renal cyst   . Sickle cell trait [facility])    Past Surgical  History:  Procedure Laterality Date  . CESAREAN SECTION N/A 05/23/2012   Procedure: CESAREAN SECTION;  Surgeon: Lahoma Crocker, MD;  Location: [facility];  Service: Obstetrics;  Laterality: N/A;  primary  . CESAREAN SECTION N/A 04/18/2014   Procedure: REPEAT CESAREAN SECTION;  Surgeon: Shelly Bombard, MD;  Location: [facility];  Service: Obstetrics;  Laterality: N/A;  . CESAREAN SECTION N/A 11/05/2017   Procedure: REPEAT CESAREAN SECTION;  Surgeon: Woodroe Mode, MD;  Location: [facility];  Service: Obstetrics;  Laterality: N/A;  . DILATION AND CURETTAGE OF UTERUS      for MAB  . EUS N/A 06/11/2015   Procedure: UPPER ENDOSCOPIC ULTRASOUND (EUS) RADIAL;  Surgeon: Milus Banister, MD;  Location: [facility];  Service: Endoscopy;  Laterality: N/A;  . PANCREAS SURGERY  2012   s/p partial pancreatectomy  . SPLENECTOMY, TOTAL  2012  . WISDOM TOOTH EXTRACTION      FAMILY HISTORY Family History  Problem Relation Age of Onset  . Diabetes Mother   . Hypertension Mother   . Asthma Father   . Glaucoma Sister   . Glaucoma Paternal Grandmother   . Colon cancer Neg Hx     SOCIAL HISTORY Social History   Tobacco Use  . Smoking status: Current Every Day Smoker    Packs/day: 0.25    Years: 13.00    Pack years: 3.25    Types: Cigarettes  . Smokeless tobacco: Never Used  . Tobacco comment: smokes 4 cigs/day, tobacco info given 04/17/15  Substance Use Topics  . Alcohol use: No    Alcohol/week: 0.0 standard drinks    Comment: social   . Drug use: No         OPHTHALMIC EXAM:  Base Eye Exam    Visual Acuity (Snellen - Linear)      Right Left   Dist [place] 20/20 +2 20/20 -2       Tonometry (Tonopen, 1:59 PM)      Right Left   Pressure 18 19       Pupils      Dark Light Shape React APD   Right 4 3 Round Brisk None   Left 4 3 Round Brisk None       Visual Fields (Counting fingers)      Left Right    Full Full       Extraocular Movement      Right Left    Full, Ortho  Full, Ortho       Neuro/Psych    Oriented x3: Yes   Mood/Affect: Normal       Dilation    Both eyes: 1.0% Mydriacyl, 2.5% Phenylephrine @ 1:59 PM        Slit Lamp and Fundus  Exam    Slit Lamp Exam      Right Left   Lids/Lashes Normal Normal   Conjunctiva/Sclera White and quiet White and quiet   Cornea trace Punctate epithelial erosions 2+Punctate epithelial erosions   Anterior Chamber Deep and quiet Deep and quiet   Iris Round and dilated Round and dilated   Lens Clear Clear   Vitreous Mild Vitreous syneresis, focal vitreous condensations at 0300 Mild Vitreous syneresis       Fundus Exam      Right Left   Disc Pink and Sharp Pink and Sharp   C/D Ratio 0.4 0.5   Macula Flat, Good foveal reflex, No heme or edema Flat, Good foveal reflex, No heme or edema   Vessels Mild Copper wiring, mild Tortuous Mild Copper wiring, mild Tortuous   Periphery Attached, very prominent vitreous base 360, pigmented CR scars 0600, focal vitreous condensations at 0300, no RT/RD on 360 exam Attached, prominent vitreous base 360, 2 adjacent atrophic holes at 0600, no SRF -- good laser surrounding          IMAGING AND PROCEDURES  Imaging and Procedures for @TODAY @  OCT, Retina - OU - Both Eyes       Right Eye Quality was good. Central Foveal Thickness: 253. Progression has been stable. Findings include normal foveal contour, no IRF, no SRF.   Left Eye Quality was good. Central Foveal Thickness: 251. Progression has been stable. Findings include normal foveal contour, no IRF, no SRF.   Notes *Images captured and stored on drive  Diagnosis / Impression:  NFP, no IRF/SRF OU  Clinical management:  See below  Abbreviations: NFP - Normal foveal profile. CME - cystoid macular edema. PED - pigment epithelial detachment. IRF - intraretinal fluid. SRF - subretinal fluid. EZ - ellipsoid zone. ERM - epiretinal membrane. ORA - outer retinal atrophy. ORT - outer retinal tubulation. SRHM -  subretinal hyper-reflective material                 ASSESSMENT/PLAN:    ICD-10-CM   1. Retinal holes, left  H33.322   2. Diabetes mellitus type 2 without retinopathy ([place])  E11.9   3. Essential hypertension  I10   4. Hypertensive retinopathy of both eyes  H35.033   5. Retinal edema  H35.81 OCT, Retina - OU - Both Eyes    1. Retinal holes OS  - 2 adjacent atrophic holes located at 0600, no SRF  - s/p laser retinopexy OS 10.02.20 -- good laser in place  - f/u 3 months  2. Diabetes mellitus, type 2 without retinopathy  - The incidence, risk factors for progression, natural history and treatment options for diabetic retinopathy  were discussed with patient.    - The need for close monitoring of blood glucose, blood pressure, and serum lipids, avoiding cigarette or any type of tobacco, and the need for long term follow up was also discussed with patient.  - f/u in 1 year, sooner prn  3,4. Hypertensive retinopathy OU  - discussed importance of tight BP control  - monitor  5. No retinal edema on exam or OCT   Ophthalmic Meds Ordered this visit:  No orders of the defined types were placed in this encounter.      Return in about 3 months (around 04/04/2019) for f/u retinal holes OS, DFE, OCT.  There are no Patient Instructions on file for this visit.    This document serves as a record of services personally performed by Sharyon Cable.  Coralyn Pear, MD, PhD. It was created on their behalf by Roselee Nova, COMT. The creation of this record is the provider's dictation and/or activities during the visit.  Electronically signed by: Roselee Nova, COMT 01/03/19 9:46 PM    Gardiner Sleeper, M.D., Ph.D. Diseases & Surgery of the Retina and Vitreous [facility] 01/03/19  I have reviewed the above documentation for accuracy and completeness, and I agree with the above. Gardiner Sleeper, M.D., Ph.D. 01/03/19 9:50 PM    Abbreviations: M myopia (nearsighted); A  astigmatism; H hyperopia (farsighted); P presbyopia; Mrx spectacle prescription;  CTL contact lenses; OD right eye; OS left eye; OU both eyes  XT exotropia; ET esotropia; PEK punctate epithelial keratitis; PEE punctate epithelial erosions; DES dry eye syndrome; MGD meibomian gland dysfunction; ATs artificial tears; PFAT's preservative free artificial tears; [place] nuclear sclerotic cataract; PSC posterior subcapsular cataract; ERM epi-retinal membrane; PVD posterior vitreous detachment; RD retinal detachment; DM diabetes mellitus; DR diabetic retinopathy; NPDR non-proliferative diabetic retinopathy; PDR proliferative diabetic retinopathy; CSME clinically significant macular edema; DME diabetic macular edema; dbh dot blot hemorrhages; CWS cotton wool spot; POAG primary open angle glaucoma; C/D cup-to-disc ratio; HVF humphrey visual field; GVF goldmann visual field; OCT optical coherence tomography; IOP intraocular pressure; BRVO Branch retinal vein occlusion; CRVO central retinal vein occlusion; CRAO central retinal artery occlusion; BRAO branch retinal artery occlusion; RT retinal tear; SB scleral buckle; PPV pars plana vitrectomy; VH Vitreous hemorrhage; PRP panretinal laser photocoagulation; IVK intravitreal kenalog; VMT vitreomacular traction; MH Macular hole;  NVD neovascularization of the disc; NVE neovascularization elsewhere; AREDS age related eye disease study; ARMD age related macular degeneration; POAG primary open angle glaucoma; EBMD epithelial/anterior basement membrane dystrophy; ACIOL anterior chamber intraocular lens; IOL intraocular lens; PCIOL posterior chamber intraocular lens; Phaco/IOL phacoemulsification with intraocular lens placement; [place] photorefractive keratectomy; LASIK laser assisted in situ keratomileusis; HTN hypertension; DM diabetes mellitus; COPD chronic obstructive pulmonary disease

## 2019-01-02 ENCOUNTER — Encounter (HOSPITAL_BASED_OUTPATIENT_CLINIC_OR_DEPARTMENT_OTHER): Admission: RE | Payer: Self-pay | Source: Home / Self Care

## 2019-01-02 ENCOUNTER — Other Ambulatory Visit: Payer: Self-pay

## 2019-01-02 ENCOUNTER — Ambulatory Visit (INDEPENDENT_AMBULATORY_CARE_PROVIDER_SITE_OTHER): Payer: Medicaid Other | Admitting: Ophthalmology

## 2019-01-02 ENCOUNTER — Ambulatory Visit (HOSPITAL_BASED_OUTPATIENT_CLINIC_OR_DEPARTMENT_OTHER)
Admission: RE | Admit: 2019-01-02 | Payer: Medicaid Other | Source: Home / Self Care | Admitting: Obstetrics & Gynecology

## 2019-01-02 ENCOUNTER — Encounter (INDEPENDENT_AMBULATORY_CARE_PROVIDER_SITE_OTHER): Payer: Self-pay | Admitting: Ophthalmology

## 2019-01-02 DIAGNOSIS — H35033 Hypertensive retinopathy, bilateral: Secondary | ICD-10-CM

## 2019-01-02 DIAGNOSIS — E119 Type 2 diabetes mellitus without complications: Secondary | ICD-10-CM

## 2019-01-02 DIAGNOSIS — H3581 Retinal edema: Secondary | ICD-10-CM | POA: Diagnosis not present

## 2019-01-02 DIAGNOSIS — I10 Essential (primary) hypertension: Secondary | ICD-10-CM

## 2019-01-02 DIAGNOSIS — H33322 Round hole, left eye: Secondary | ICD-10-CM

## 2019-01-02 LAB — HM DIABETES EYE EXAM

## 2019-01-02 SURGERY — LAPAROSCOPY, DIAGNOSTIC
Anesthesia: Choice

## 2019-01-03 ENCOUNTER — Encounter (INDEPENDENT_AMBULATORY_CARE_PROVIDER_SITE_OTHER): Payer: Self-pay | Admitting: Ophthalmology

## 2019-01-16 ENCOUNTER — Encounter: Payer: Self-pay | Admitting: Obstetrics & Gynecology

## 2019-01-16 ENCOUNTER — Ambulatory Visit (INDEPENDENT_AMBULATORY_CARE_PROVIDER_SITE_OTHER): Payer: Medicaid Other | Admitting: Obstetrics & Gynecology

## 2019-01-16 ENCOUNTER — Other Ambulatory Visit: Payer: Self-pay

## 2019-01-16 DIAGNOSIS — N3941 Urge incontinence: Secondary | ICD-10-CM | POA: Diagnosis not present

## 2019-01-16 DIAGNOSIS — N939 Abnormal uterine and vaginal bleeding, unspecified: Secondary | ICD-10-CM

## 2019-01-16 DIAGNOSIS — R102 Pelvic and perineal pain: Secondary | ICD-10-CM | POA: Diagnosis not present

## 2019-01-16 MED ORDER — TOLTERODINE TARTRATE ER 4 MG PO CP24: 4.0000 mg | ORAL_CAPSULE | Freq: Every day | ORAL | 2 refills | Status: DC

## 2019-01-16 NOTE — Progress Notes (Signed)
Patient ID: [name], female   DOB: 1985-06-15, 33 y.o.   MRN: [id number]  Cc: urinary incontinence, dyspareunia, DUB  HPI [name] is a 33 y.o. female.  [id number] Patient's last menstrual period was 12/24/2018 (approximate). Patient has prolonged heavy menses 8-21 days with cramps. She notes RLQ pain with intercourse and urinary frequency and urge incontinence. She was scheduled for laparoscopy and ablation 2 weeks ago and had to cancel to have eye surgery. She would like to reschedule. HPI  Past Medical History:  Diagnosis Date  . Alcohol abuse   . Anemia   . Asthma    rarely uses inhaler  . Bipolar disorder ([place])    HX PPD after 2014 delivery, no meds  . Bronchitis   . Diabetes mellitus without complication ([place])    partial pancreatectomy 2012-glyburide  . Fibroid   . Hypertension   . Hypertensive retinopathy    OU  . Missed ab    x 2, one requiring D & E  . Pancreatic cyst   . Pancreatitis    diet controlled  . Pelvic inflammatory disease   . Plantar fasciitis   . Polycystic ovary syndrome   . Renal cyst   . Sickle cell trait [facility])     Past Surgical History:  Procedure Laterality Date  . CESAREAN SECTION N/A 05/23/2012   Procedure: CESAREAN SECTION;  Surgeon: Lahoma Crocker, MD;  Location: [facility];  Service: Obstetrics;  Laterality: N/A;  primary  . CESAREAN SECTION N/A 04/18/2014   Procedure: REPEAT CESAREAN SECTION;  Surgeon: Shelly Bombard, MD;  Location: [facility];  Service: Obstetrics;  Laterality: N/A;  . CESAREAN SECTION N/A 11/05/2017   Procedure: REPEAT CESAREAN SECTION;  Surgeon: Woodroe Mode, MD;  Location: [facility];  Service: Obstetrics;  Laterality: N/A;  . DILATION AND CURETTAGE OF UTERUS      for MAB  . EUS N/A 06/11/2015   Procedure: UPPER ENDOSCOPIC ULTRASOUND (EUS) RADIAL;  Surgeon: Milus Banister, MD;  [facility];  Service: Endoscopy;  Laterality: N/A;  . PANCREAS SURGERY  2012   s/p partial  pancreatectomy  . SPLENECTOMY, TOTAL  2012  . WISDOM TOOTH EXTRACTION      Family History  Problem Relation Age of Onset  . Diabetes Mother   . Hypertension Mother   . Asthma Father   . Glaucoma Sister   . Glaucoma Paternal Grandmother   . Colon cancer Neg Hx     Social History Social History   Tobacco Use  . Smoking status: Current Every Day Smoker    Packs/day: 0.25    Years: 13.00    Pack years: 3.25    Types: Cigarettes  . Smokeless tobacco: Never Used  . Tobacco comment: smokes 4 cigs/day, tobacco info given 04/17/15  Substance Use Topics  . Alcohol use: No    Alcohol/week: 0.0 standard drinks    Comment: social   . Drug use: No    Allergies  Allergen Reactions  . Dilaudid [Hydromorphone Hcl] Itching and Other (See Comments)    Reaction:  Hallucinations    Current Outpatient Medications  Medication Sig Dispense Refill  . acetaminophen (TYLENOL) 500 MG tablet Take 500-1,000 mg by mouth every 6 (six) hours as needed for mild pain, moderate pain or headache.     . lipase/protease/amylase (CREON) 12000 units CPEP capsule Take 36,000 Units by mouth 3 (three) times daily before meals.    . metFORMIN (GLUCOPHAGE) 500 MG tablet Take 1 tablet (500  mg total) by mouth 2 (two) times daily. 60 tablet 5  . oxyCODONE-acetaminophen (PERCOCET/ROXICET) 5-325 MG tablet Take 1-2 tablets by mouth every 8 (eight) hours as needed for severe pain. 30 tablet 0  . RESTASIS 0.05 % ophthalmic emulsion INSTILL 1 DROP INTO BOTH EYES TWICE A DAY    . methocarbamol (ROBAXIN) 500 MG tablet Take 1 tablet (500 mg total) by mouth 2 (two) times daily. 20 tablet 0  . Prenatal Vit-Fe Fumarate-FA (PRENATAL MULTIVITAMIN) TABS tablet Take 1 tablet by mouth daily at 12 noon.    . tranexamic acid (LYSTEDA) 650 MG TABS tablet Take 2 tablets (1,300 mg total) by mouth 3 (three) times daily. Take during menses for a maximum of five days (Patient not taking: Reported on 01/16/2019) 30 tablet 2   No current  facility-administered medications for this visit.     Review of Systems Review of Systems  Constitutional: Negative.   Respiratory: Negative.   Cardiovascular: Negative.   Gastrointestinal: Negative.   Genitourinary: Positive for dyspareunia, frequency, menstrual problem and urgency. Negative for vaginal bleeding and vaginal discharge.    Blood pressure 119/84, pulse 83, height 5' (1.524 m), weight 154 lb (69.9 kg), last menstrual period 12/24/2018, currently breastfeeding.  Physical Exam Physical Exam Constitutional:      Appearance: Normal appearance. She is not ill-appearing.  Cardiovascular:     Rate and Rhythm: Normal rate.  Pulmonary:     Effort: Pulmonary effort is normal.  Abdominal:     General: Abdomen is flat. There is no distension.  Neurological:     Mental Status: She is alert.     Data Reviewed  CLINICAL DATA:  Menorrhagia  EXAM: TRANSABDOMINAL AND TRANSVAGINAL ULTRASOUND OF PELVIS  TECHNIQUE: Both transabdominal and transvaginal ultrasound examinations of the pelvis were performed. Transabdominal technique was performed for global imaging of the pelvis including uterus, ovaries, adnexal regions, and pelvic cul-de-sac. It was necessary to proceed with endovaginal exam following the transabdominal exam to visualize the endometrium.  COMPARISON:  None  FINDINGS: Uterus  Measurements: 9.6 x 5.8 x 6.2 cm = volume: 179.5 mL. There is a possible 1.2 x 0.8 x 0.9 cm intramural fibroid within the anterior right uterine fundus.  Endometrium  Thickness: 8 mm. No focal abnormality visualized. Small amount of peripheral calcifications.  Right ovary  Measurements: 4.1 x 2.5 x 3.3 cm = volume: 17.7 mL. Normal appearance/no adnexal mass.  Left ovary  Measurements: 4.1 x 1.8 x 3.0 cm = volume: 11.7 mL. Normal appearance/no adnexal mass.  Other findings  No abnormal free fluid.  IMPRESSION: Endometrium measures 8 mm. If bleeding  remains unresponsive to hormonal or medical therapy, sonohysterogram should be considered for focal lesion work-up. (Ref: Radiological Reasoning: Algorithmic Workup of Abnormal Vaginal Bleeding with Endovaginal Sonography and Sonohysterography. AJR [id number])  Probable small fibroid.   Electronically Signed   By: Lovey Newcomer M.D.   On: 12/03/2018 16:52  Assessment DUB post BTL last year Small fibroid Urinary sx warrant urology f/u Urge incontinence, s/p UTI  Plan Detrol LA 4 mg Urology referral made Laparoscopy and endometrial ablation. Schedule endometrial biopsy    [name] 01/16/2019, 3:56 PM

## 2019-01-16 NOTE — Patient Instructions (Signed)
Endometrial Ablation Endometrial ablation is a procedure that destroys the thin inner layer of the lining of the uterus (endometrium). This procedure may be done:  To stop heavy periods.  To stop bleeding that is causing anemia.  To control irregular bleeding.  To treat bleeding caused by small tumors (fibroids) in the endometrium. This procedure is often an alternative to major surgery, such as removal of the uterus and cervix (hysterectomy). As a result of this procedure:  You may not be able to have children. However, if you are premenopausal (you have not gone through menopause): ? You may still have a small chance of getting pregnant. ? You will need to use a reliable method of birth control after the procedure to prevent pregnancy.  You may stop having a menstrual period, or you may have only a small amount of bleeding during your period. Menstruation may return several years after the procedure. Tell a health care provider about:  Any allergies you have.  All medicines you are taking, including vitamins, herbs, eye drops, creams, and over-the-counter medicines.  Any problems you or family members have had with the use of anesthetic medicines.  Any blood disorders you have.  Any surgeries you have had.  Any medical conditions you have. What are the risks? Generally, this is a safe procedure. However, problems may occur, including:  A hole (perforation) in the uterus or bowel.  Infection of the uterus, bladder, or vagina.  Bleeding.  Damage to other structures or organs.  An air bubble in the lung (air embolus).  Problems with pregnancy after the procedure.  Failure of the procedure.  Decreased ability to diagnose cancer in the endometrium. What happens before the procedure?  You will have tests of your endometrium to make sure there are no pre-cancerous cells or cancer cells present.  You may have an ultrasound of the uterus.  You may be given medicines to  thin the endometrium.  Ask your health care provider about: ? Changing or stopping your regular medicines. This is especially important if you take diabetes medicines or blood thinners. ? Taking medicines such as aspirin and ibuprofen. These medicines can thin your blood. Do not take these medicines before your procedure if your doctor tells you not to.  Plan to have someone take you home from the hospital or clinic. What happens during the procedure?   You will lie on an exam table with your feet and legs supported as in a pelvic exam.  To lower your risk of infection: ? Your health care team will wash or sanitize their hands and put on germ-free (sterile) gloves. ? Your genital area will be washed with soap.  An IV tube will be inserted into one of your veins.  You will be given a medicine to help you relax (sedative).  A surgical instrument with a light and camera (resectoscope) will be inserted into your vagina and moved into your uterus. This allows your surgeon to see inside your uterus.  Endometrial tissue will be removed using one of the following methods: ? Radiofrequency. This method uses a radiofrequency-alternating electric current to remove the endometrium. ? Cryotherapy. This method uses extreme cold to freeze the endometrium. ? Heated-free liquid. This method uses a heated saltwater (saline) solution to remove the endometrium. ? Microwave. This method uses high-energy microwaves to heat up the endometrium and remove it. ? Thermal balloon. This method involves inserting a catheter with a balloon tip into the uterus. The balloon tip is filled with   heated fluid to remove the endometrium. The procedure may vary among health care providers and hospitals. What happens after the procedure?  Your blood pressure, heart rate, breathing rate, and blood oxygen level will be monitored until the medicines you were given have worn off.  As tissue healing occurs, you may notice  vaginal bleeding for 4-6 weeks after the procedure. You may also experience: ? Cramps. ? Thin, watery vaginal discharge that is light pink or brown in color. ? A need to urinate more frequently than usual. ? Nausea.  Do not drive for 24 hours if you were given a sedative.  Do not have sex or insert anything into your vagina until your health care provider approves. Summary  Endometrial ablation is done to treat the many causes of heavy menstrual bleeding.  The procedure may be done only after medications have been tried to control the bleeding.  Plan to have someone take you home from the hospital or clinic. This information is not intended to replace advice given to you by your health care provider. Make sure you discuss any questions you have with your health care provider. Document Released: 01/01/2004 Document Revised: 08/08/2017 Document Reviewed: 03/10/2016 Elsevier Patient Education  2020 Elsevier Inc.  

## 2019-01-17 ENCOUNTER — Telehealth: Payer: Self-pay | Admitting: General Practice

## 2019-01-17 DIAGNOSIS — N3941 Urge incontinence: Secondary | ICD-10-CM

## 2019-01-17 MED ORDER — SOLIFENACIN SUCCINATE 5 MG PO TABS: 5.0000 mg | ORAL_TABLET | Freq: Every day | ORAL | 2 refills | Status: DC

## 2019-01-17 NOTE — Telephone Encounter (Signed)
Received fax from pharmacy Detrol LA requires a PA. Called medicaid who states patient needs to try & fail two of the preferred drugs first, preferred drugs being toviaz, oxybutynin, or vesicare.   Discussed with Dr Roselie Awkward who states patient should start vesicare 5mg  daily instead. Rx prescribed & detrol LA discontinued.

## 2019-01-22 ENCOUNTER — Encounter: Payer: Self-pay | Admitting: Dietician

## 2019-01-28 ENCOUNTER — Ambulatory Visit: Payer: Medicaid Other | Admitting: Internal Medicine

## 2019-01-28 ENCOUNTER — Encounter: Payer: Self-pay | Admitting: Internal Medicine

## 2019-01-28 ENCOUNTER — Other Ambulatory Visit: Payer: Self-pay

## 2019-01-28 VITALS — BP 113/73 | HR 89 | Temp 98.4°F | Ht 60.0 in | Wt 157.3 lb

## 2019-01-28 DIAGNOSIS — Z79899 Other long term (current) drug therapy: Secondary | ICD-10-CM

## 2019-01-28 DIAGNOSIS — Z9081 Acquired absence of spleen: Secondary | ICD-10-CM

## 2019-01-28 DIAGNOSIS — E139 Other specified diabetes mellitus without complications: Secondary | ICD-10-CM | POA: Diagnosis not present

## 2019-01-28 DIAGNOSIS — Z90411 Acquired partial absence of pancreas: Secondary | ICD-10-CM

## 2019-01-28 DIAGNOSIS — Z7984 Long term (current) use of oral hypoglycemic drugs: Secondary | ICD-10-CM

## 2019-01-28 DIAGNOSIS — M545 Low back pain, unspecified: Secondary | ICD-10-CM

## 2019-01-28 DIAGNOSIS — Z8603 Personal history of neoplasm of uncertain behavior: Secondary | ICD-10-CM | POA: Diagnosis not present

## 2019-01-28 DIAGNOSIS — E119 Type 2 diabetes mellitus without complications: Secondary | ICD-10-CM

## 2019-01-28 DIAGNOSIS — Z720 Tobacco use: Secondary | ICD-10-CM | POA: Diagnosis not present

## 2019-01-28 LAB — POCT GLYCOSYLATED HEMOGLOBIN (HGB A1C): Hemoglobin A1C: 5.8 % — AB (ref 4.0–5.6)

## 2019-01-28 LAB — POCT URINALYSIS DIPSTICK
Bilirubin, UA: NEGATIVE
Glucose, UA: NEGATIVE
Leukocytes, UA: NEGATIVE
Nitrite, UA: NEGATIVE
Protein, UA: NEGATIVE
Spec Grav, UA: 1.025 (ref 1.010–1.025)
Urobilinogen, UA: 1 E.U./dL
pH, UA: 6.5 (ref 5.0–8.0)

## 2019-01-28 LAB — GLUCOSE, CAPILLARY: Glucose-Capillary: 111 mg/dL — ABNORMAL HIGH (ref 70–99)

## 2019-01-28 MED ORDER — NAPROXEN 500 MG PO TABS: 500.0000 mg | ORAL_TABLET | Freq: Two times a day (BID) | ORAL | 0 refills | Status: AC

## 2019-01-28 NOTE — Patient Instructions (Addendum)
[name],  It was a pleasure to see you today. Thank you for coming in.   Today we discussed your back pain. I have ordered an MRI to evaluate your back. I have sent in a prescription for Naproxen 500 mg daily, please start taking this along with the tylenol. I will call you with the results of the MRI.   Please return to clinic in 6 months or sooner if needed.   Thank you again for coming in.   [name]

## 2019-01-28 NOTE — Progress Notes (Signed)
CC: Back pain, diabetes  HPI:  [name] is a 33 y.o.  with a PMH listed below presenting for back pain and diabetes.    Please see A&P for status of the patient's chronic medical conditions  Past Medical History:  Diagnosis Date  . Alcohol abuse   . Anemia   . Asthma    rarely uses inhaler  . Bipolar disorder ([place])    HX PPD after 2014 delivery, no meds  . Bronchitis   . 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 Fibroid   . Hypertension   . Hypertensive retinopathy    OU  . Missed ab    x 2, one requiring D & E  . Pancreatic cyst   . Pancreatitis    diet controlled  . Pelvic inflammatory disease   . Plantar fasciitis   . Polycystic ovary syndrome   . Renal cyst   . Sickle cell trait ([place])    Review of Systems: Refer to history of present illness and assessment and plans for pertinent review of systems, all others reviewed and negative.  Physical Exam:  Vitals:   01/28/19 1459 01/28/19 1509  BP:  113/73  Pulse:  89  Temp:  98.4 F (36.9 C)  TempSrc:  Oral  SpO2:  100%  Weight: 157 lb 4.8 oz (71.4 kg)   Height: 5' (1.524 m)     Physical Exam  Constitutional: She is oriented to person, place, and time.  Young female, intermittent distress, movement limited by pain  HENT:  Head: Normocephalic and atraumatic.  Neck: Normal range of motion. Neck supple. No thyromegaly present.  Cardiovascular: Normal rate, regular rhythm and normal heart sounds.  Pulmonary/Chest: No respiratory distress.  Abdominal: Soft. Bowel sounds are normal. She exhibits no distension.  Musculoskeletal:        General: Tenderness (TTP over L5-L6 spinous process) present.     Comments: Positive straight leg test of bilateral legs, active and passive movement limited by pain. No erythema, edema, or warmth noted.   Neurological: She is alert and oriented to person, place, and time.  Sensation to light touch intact in LE  Skin:  Skin is warm and dry.    Social History   Socioeconomic History  . Marital status: Single    Spouse name: Not on file  . Number of children: 2  . Years of education: Not on file  . Highest education level: Not on file  Occupational History  . Occupation: Agricultural engineer  Social Needs  . Financial resource strain: Not hard at all  . Food insecurity    Worry: Never true    Inability: Never true  . Transportation needs    Medical: No    Non-medical: No  Tobacco Use  . Smoking status: Current Every Day Smoker    Packs/day: 0.25    Years: 13.00    Pack years: 3.25    Types: Cigarettes  . Smokeless tobacco: Never Used  . Tobacco comment: smokes 4 cigs/day, tobacco info given 04/17/15  Substance and Sexual Activity  . Alcohol use: No    Alcohol/week: 0.0 standard drinks    Comment: social   . Drug use: No  . Sexual activity: Not Currently    Partners: Male    Birth control/protection: None  Lifestyle  . Physical activity    Days per week: Not on file    Minutes per session: Not on file  . Stress: Only a little  Relationships  . Social Herbalist on phone: Not on file    Gets together: Not on file    Attends religious service: Not on file    Active member of club or organization: Not on file    Attends meetings of clubs or organizations: Not on file    Relationship status: Not on file  . Intimate partner violence    Fear of current or ex partner: No    Emotionally abused: No    Physically abused: No    Forced sexual activity: No  Other Topics Concern  . Not on file  Social History Narrative  . Not on file   Family History  Problem Relation Age of Onset  . Diabetes Mother   . Hypertension Mother   . Asthma Father   . Glaucoma Sister   . Glaucoma Paternal Grandmother   . Colon cancer Neg Hx     Assessment & Plan:   See Encounters Tab for problem based charting.  Patient discussed with Dr. Lynnae January

## 2019-01-29 ENCOUNTER — Encounter: Payer: Self-pay | Admitting: Internal Medicine

## 2019-01-29 DIAGNOSIS — R109 Unspecified abdominal pain: Secondary | ICD-10-CM | POA: Insufficient documentation

## 2019-01-29 DIAGNOSIS — M549 Dorsalgia, unspecified: Secondary | ICD-10-CM | POA: Insufficient documentation

## 2019-01-29 LAB — URINALYSIS, ROUTINE W REFLEX MICROSCOPIC
Bilirubin, UA: NEGATIVE
Glucose, UA: NEGATIVE
Ketones, UA: NEGATIVE
Leukocytes,UA: NEGATIVE
Nitrite, UA: NEGATIVE
Protein,UA: NEGATIVE
RBC, UA: NEGATIVE
Specific Gravity, UA: 1.022 (ref 1.005–1.030)
Urobilinogen, Ur: 0.2 mg/dL (ref 0.2–1.0)
pH, UA: 6.5 (ref 5.0–7.5)

## 2019-01-29 NOTE — Assessment & Plan Note (Signed)
She reports that she has been having some back pain since Tuesday. She reports that on on Tuesday she woke up with some slight discomfort in her back, she started making breakfast and getting things ready for her kids and the pain continued to worsen, she reports that she was unable to pick up her daughter due to the pain. The pain was initially located in the middle lower back area, then radiated down to both her her hips and down to her legs, it is a sharp, throbbing, constant pain. She tried taking tylenol which didn't help, she then tried aleeve which helped a little bit. She denies any recent trauma to the area, no recent falls. She denies any fevers, chills, weight loss, or bowel incontinence. She does report bladder incontinence however she reports that this has been going over for >1 month, she is supposed to see urology for this. On exam she has point tenderness over the L5-L6 area, and a positive straight leg test, strength testing limited due to pain. Patient has a history of chronic pancreatitis s/p pancreatectomy and splenectomy. 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 She had an MRI done in 12/2017 that showed stable post-op changes, no evidence of recurrent or metastatic neoplasm. Given the history and sudden onset of symptoms differential includes herniated disc, infection, compression fracture, and metastases.   -She has some red flag symptoms, including point tenderness and bladder incontinence. Bladder incontinence appears to have started prior to the back pain so this does not seem to need an emergent evaluation. We discussed that she needs to get an MRI to further evaluate, will attempt to do this outpatient.  -STAT Lumbar MRI -Start naproxen 500 mg BID, advised her to take it with the tylenol -Advised her that if symptoms worsen or she develops new neurological findings to contact us

## 2019-01-29 NOTE — Telephone Encounter (Signed)
Contacted patient regarding her back. She reports that she started taking tylenol and naproxen and she reports that it has not helped. She reports that her aunt is helping her right now with her children. She is able to do her ADLs, able to dress herself, make her own meals, and get around the house however she does report significant pain with this. She reports that she has a lot of pain with picking up her toddler. We discussed trying a muscle relaxer since this seems to help with some acute back pains, she reported that she has been on robaxin before and it caused her a lot of sedation. She reported that she did not want to try this since she is taking care of her daughter. She does have some percocet at home that she normally takes for her chronic pancreatitis flairs and was wondering if that would help. We discussed that opioids typically do not help with acute back pain and since we do not know what is causing her back pain it would not be something I would prescribe. However since she is having significant back pain and it may take awhile for her MRI to be scheduled it seems reasonable to have her try the percocet for the back pain. She does have a good understanding of opioids and the risk of addiction. Advised her to continue the naproxen and tylenol for now. Informed her that if she develops worsening back pain, bowel incontinence, worsening bladder incontinence, or inability to perform ADLs then she should come into the hospital to be evaluated and treated.

## 2019-01-29 NOTE — Assessment & Plan Note (Signed)
Patient is on metformin 500 mg BID, she denies any issues. A1c today is 5.8. Stable.

## 2019-01-31 ENCOUNTER — Encounter: Payer: Self-pay | Admitting: Internal Medicine

## 2019-01-31 NOTE — Progress Notes (Signed)
Internal Medicine Clinic Attending  Case discussed with Dr. Sherry Ruffing at the time of the visit.  We reviewed the resident's history and exam and pertinent patient test results.  I agree with the assessment, diagnosis, and plan of care documented in the resident's note.  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 However, still enough red / orange flags for MRI.

## 2019-02-10 ENCOUNTER — Encounter: Payer: Self-pay | Admitting: Internal Medicine

## 2019-02-11 ENCOUNTER — Ambulatory Visit (HOSPITAL_COMMUNITY)
Admission: RE | Admit: 2019-02-11 | Discharge: 2019-02-11 | Disposition: A | Discharge status: Home / Self Care | Payer: Medicaid Other | Source: Ambulatory Visit | Attending: Internal Medicine | Admitting: Internal Medicine

## 2019-02-11 ENCOUNTER — Other Ambulatory Visit: Payer: Self-pay

## 2019-02-11 DIAGNOSIS — M545 Low back pain, unspecified: Secondary | ICD-10-CM

## 2019-02-12 ENCOUNTER — Other Ambulatory Visit: Payer: Self-pay | Admitting: Internal Medicine

## 2019-02-12 DIAGNOSIS — M545 Low back pain, unspecified: Secondary | ICD-10-CM

## 2019-02-25 DIAGNOSIS — R030 Elevated blood-pressure reading, without diagnosis of hypertension: Secondary | ICD-10-CM | POA: Diagnosis not present

## 2019-02-25 DIAGNOSIS — Z6830 Body mass index (BMI) 30.0-30.9, adult: Secondary | ICD-10-CM | POA: Diagnosis not present

## 2019-02-25 DIAGNOSIS — M545 Low back pain: Secondary | ICD-10-CM | POA: Diagnosis not present

## 2019-03-04 ENCOUNTER — Telehealth: Payer: Self-pay | Admitting: Internal Medicine

## 2019-03-04 DIAGNOSIS — R1013 Epigastric pain: Secondary | ICD-10-CM

## 2019-03-04 MED ORDER — ONDANSETRON HCL 4 MG PO TABS: 4.0000 mg | ORAL_TABLET | Freq: Three times a day (TID) | ORAL | 1 refills | Status: DC | PRN

## 2019-03-04 NOTE — Telephone Encounter (Signed)
Patient is calling- stating that she is having pancreatitis attack- stating she is nauseas and nothing is helping- she wanted to come in to see someone but did not want to wait until February. Asking for someone to call her to see what she can do to help.

## 2019-03-04 NOTE — Telephone Encounter (Signed)
Patient reports a few days of epigastric and mid back pain.  She reports that she also has nausea. This pain is consistent with previous flares of her pancreatitis. She has been taking Creon consistently and does not understand where the pain has come from.  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  The epigastric pain started near after several days on BID naproxen.  She will come for labs tomorrow that were ordered by Nicoletta Ba PA cbc, cmet, and lipase.  She will start rx for xzofran 4 mg q 6 hours prn also ordered by Nicoletta Ba PA .  Patient will begin OTC PPI BID for one week then daily while taking naproxen.  She will call back for any additional question or concerns.

## 2019-03-05 ENCOUNTER — Ambulatory Visit: Payer: Medicaid Other | Attending: Internal Medicine | Admitting: Physical Therapy

## 2019-03-07 ENCOUNTER — Telehealth: Payer: Self-pay | Admitting: Physician Assistant

## 2019-03-07 ENCOUNTER — Other Ambulatory Visit (INDEPENDENT_AMBULATORY_CARE_PROVIDER_SITE_OTHER): Payer: Self-pay

## 2019-03-07 DIAGNOSIS — R1013 Epigastric pain: Secondary | ICD-10-CM

## 2019-03-07 LAB — CBC WITH DIFFERENTIAL/PLATELET
Basophils Absolute: 0 10*3/uL (ref 0.0–0.1)
Basophils Relative: 1.1 % (ref 0.0–3.0)
Eosinophils Absolute: 0 10*3/uL (ref 0.0–0.7)
Eosinophils Relative: 1 % (ref 0.0–5.0)
HCT: 36 % (ref 36.0–46.0)
Hemoglobin: 11.6 g/dL — ABNORMAL LOW (ref 12.0–15.0)
Lymphocytes Relative: 36.6 % (ref 12.0–46.0)
Lymphs Abs: 1.6 10*3/uL (ref 0.7–4.0)
MCHC: 32.2 g/dL (ref 30.0–36.0)
MCV: 69.3 fl — ABNORMAL LOW (ref 78.0–100.0)
Monocytes Absolute: 0.5 10*3/uL (ref 0.1–1.0)
Monocytes Relative: 12.2 % — ABNORMAL HIGH (ref 3.0–12.0)
Neutro Abs: 2.2 10*3/uL (ref 1.4–7.7)
Neutrophils Relative %: 49.1 % (ref 43.0–77.0)
Platelets: 507 10*3/uL — ABNORMAL HIGH (ref 150.0–400.0)
RBC: 5.19 Mil/uL — ABNORMAL HIGH (ref 3.87–5.11)
RDW: 17.4 % — ABNORMAL HIGH (ref 11.5–15.5)
WBC: 4.5 10*3/uL (ref 4.0–10.5)

## 2019-03-07 LAB — COMPREHENSIVE METABOLIC PANEL
ALT: 10 U/L (ref 0–35)
AST: 15 U/L (ref 0–37)
Albumin: 4.5 g/dL (ref 3.5–5.2)
Alkaline Phosphatase: 58 U/L (ref 39–117)
BUN: 9 mg/dL (ref 6–23)
CO2: 26 mEq/L (ref 19–32)
Calcium: 9.2 mg/dL (ref 8.4–10.5)
Chloride: 104 mEq/L (ref 96–112)
Creatinine, Ser: 0.64 mg/dL (ref 0.40–1.20)
GFR: 127.88 mL/min (ref >60.00)
Glucose, Bld: 122 mg/dL — ABNORMAL HIGH (ref 70–99)
Potassium: 3.9 mEq/L (ref 3.5–5.1)
Sodium: 137 mEq/L (ref 135–145)
Total Bilirubin: 0.4 mg/dL (ref 0.2–1.2)
Total Protein: 7.6 g/dL (ref 6.0–8.3)

## 2019-03-07 LAB — LIPASE: Lipase: 8 U/L — ABNORMAL LOW (ref 11.0–59.0)

## 2019-03-07 NOTE — Telephone Encounter (Signed)
Spoke with the patient. She is taking the OTC Protonix as she was directed. She still has spells of her stomach hurting. She took Tramadol but felt palpitations. She is concerned they were caused by the Tramadol. Admits to not taking any nutrition "hardly at all because I am afraid it will hurt."  Plan is to push flis. May use Pedialyte for hydration. Contact her neurologist about alternatives to pain control. Continue medications as ordered.

## 2019-03-18 ENCOUNTER — Telehealth: Payer: Self-pay | Admitting: Physician Assistant

## 2019-03-18 ENCOUNTER — Other Ambulatory Visit: Payer: Self-pay

## 2019-03-18 DIAGNOSIS — K859 Acute pancreatitis without necrosis or infection, unspecified: Secondary | ICD-10-CM

## 2019-03-18 MED ORDER — PANCRELIPASE (LIP-PROT-AMYL) 36000-114000 UNITS PO CPEP: 72000.0000 [IU] | ORAL_CAPSULE | Freq: Three times a day (TID) | ORAL | 0 refills | Status: DC

## 2019-03-19 ENCOUNTER — Ambulatory Visit: Payer: Medicaid Other | Attending: Internal Medicine | Admitting: Physical Therapy

## 2019-03-19 ENCOUNTER — Other Ambulatory Visit: Payer: Self-pay

## 2019-03-19 DIAGNOSIS — M5441 Lumbago with sciatica, right side: Secondary | ICD-10-CM | POA: Insufficient documentation

## 2019-03-19 DIAGNOSIS — M6281 Muscle weakness (generalized): Secondary | ICD-10-CM | POA: Insufficient documentation

## 2019-03-19 NOTE — Telephone Encounter (Signed)
Spoke with the patient. States yesterday was bad. She was unable to pick up her medication because of her pain. Today she could not pick it up because the line was around the building. She is still using samples. Patient describes her pain in more detail. She tells me she is tender in her "upper belly under my breast" "can't stand anything to push or touch on me."  She says she "feel full all the time, like I can't eat." Bowel movements are easy to pass and she goes every 2 to 3 days. No diarrhea, no oily stools. She has tried Tylenol 1000 mg and heat without relief. She is not taking the Naproxen since she was advised to stop it.  She understands it is unclear what is causing her pain and hopefully the imaging will help. I do not have any advise on how to manage her pain. She is stating difficulty functioning when her pain hits.

## 2019-03-19 NOTE — Therapy (Signed)
[place], [place], [address] Phone: [phone]   Fax:  [phone]  Physical Therapy Evaluation  Patient Details  Name: [name] MRN: [id number] Date of Birth: 1985-12-20 Referring Provider (PT): Karen Kays MD   Encounter Date: 03/19/2019  PT End of Session - 03/19/19 1307    Visit Number  1    Number of Visits  4    Date for PT Re-Evaluation  04/09/19    Authorization Type  MCD  1st authorization submitted 03-19-2019    PT Start Time  1103    PT Stop Time  1144    PT Time Calculation (min)  41 min    Activity Tolerance  Patient tolerated treatment well    Behavior During Therapy  [facility] for tasks assessed/performed       Past Medical History:  Diagnosis Date  . Alcohol abuse   . Anemia   . Asthma    rarely uses inhaler  . Bipolar disorder ([place])    HX PPD after 2014 delivery, no meds  . Bronchitis   . 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 Fibroid   . Hypertension   . Hypertensive retinopathy    OU  . Missed ab    x 2, one requiring D & E  . Pancreatic cyst   . Pancreatitis    diet controlled  . Pelvic inflammatory disease   . Plantar fasciitis   . Polycystic ovary syndrome   . Renal cyst   . Sickle cell trait [facility])     Past Surgical History:  Procedure Laterality Date  . CESAREAN SECTION N/A 05/23/2012   Procedure: CESAREAN SECTION;  Surgeon: Lahoma Crocker, MD;  Location: [facility];  Service: Obstetrics;  Laterality: N/A;  primary  . CESAREAN SECTION N/A 04/18/2014   Procedure: REPEAT CESAREAN SECTION;  Surgeon: Shelly Bombard, MD;  Location: [facility];  Service: Obstetrics;  Laterality: N/A;  . CESAREAN SECTION N/A 11/05/2017   Procedure: REPEAT CESAREAN SECTION;  Surgeon: Woodroe Mode, MD;  Location: [facility];  Service: Obstetrics;  Laterality: N/A;  . DILATION AND CURETTAGE OF UTERUS      for MAB  .  EUS N/A 06/11/2015   Procedure: UPPER ENDOSCOPIC ULTRASOUND (EUS) RADIAL;  Surgeon: Milus Banister, MD;  Location: [facility];  Service: Endoscopy;  Laterality: N/A;  . PANCREAS SURGERY  2012   s/p partial pancreatectomy  . SPLENECTOMY, TOTAL  2012  . WISDOM TOOTH EXTRACTION      There were no vitals filed for this visit.   Subjective Assessment - 03/19/19 1115    Subjective  Pt has had back pain for about a year with a recent acute exacerbation of back pain.  Pt does not know what caused it but she states she was unable to pick up her baby or do any housework, drive.  she was having trouble getting up and down from seating/toilet  .    Pertinent History  DM, menorhagia    Limitations  Sitting;Standing;Walking    How long can you sit comfortably?  depending on what chair 10 minutes    How long can you stand comfortably?  20 minutes    How long can you walk comfortably?  15 minutes    Diagnostic tests  MRI1. At L5-S1 there is a broad central disc protrusion.    Patient Stated Goals  to learn HEP and ways I can prevent causing  pain    Currently in Pain?  Yes    Pain Score  5    at worst 8/10   Pain Location  Back    Pain Orientation  Mid;Lower    Pain Descriptors / Indicators  Aching;Throbbing;Shooting    Pain Type  Acute pain    Pain Radiating Towards  both legs to calves.  R>Lt  sometimes into my tailbone    Pain Onset  More than a month ago    Pain Frequency  Intermittent    Aggravating Factors   lifting baby, housework like laundry, constant bending to pick up toys    Pain Relieving Factors  No lifting.  Naprosyn but flairing her pancreatitis         [facility] PT Assessment - 03/19/19 0001      Assessment   Medical Diagnosis  Acute midline Low back time with scitica RT>LT    Referring Provider (PT)  nischal, Narindra MD    Onset Date/Surgical Date  12/26/18    Hand Dominance  Right    Next MD Visit  not scheduled yet    Prior Therapy  none      Precautions   Precautions   Back    Precaution Comments  minimal housework minimal lifting      Restrictions   Weight Bearing Restrictions  No      Balance Screen   Has the patient fallen in the past 6 months  Yes    How many times?  1   due to back pain fell with baby   Has the patient had a decrease in activity level because of a fear of falling?   No    Is the patient reluctant to leave their home because of a fear of falling?   No      Home Environment   Living Environment  Private residence    Living Arrangements  Spouse/significant other;Children    Type of [place] to enter    Entrance Stairs-Number of Steps  1    Entrance Stairs-Rails  None    Home Layout  Two level    Alternate Level Stairs-Number of Steps  7   2 sets   Alternate Level Stairs-Rails  Can reach both      Observation/Other Assessments   Focus on Therapeutic Outcomes (FOTO)   not taken MCD      Posture/Postural Control   Posture/Postural Control  Postural limitations    Postural Limitations  Rounded Shoulders;Forward head;Anterior pelvic tilt      ROM / Strength   AROM / PROM / Strength  AROM;Strength      AROM   Overall AROM   Deficits    Lumbar Flexion  limted 50 %   13 inches finger tip to floor   Lumbar Extension  limited 50%    Lumbar - Right Side Bend  limited 25%    Lumbar - Left Side Bend  limited 50%   pain on RT side   Lumbar - Right Rotation  80%    Lumbar - Left Rotation  50%   pain onRT back     Strength   Overall Strength  Deficits    Right Hip Flexion  4/5   pain   Right Hip Extension  4-/5   pain on right back   Right Hip ABduction  4-/5   pain on right back   Left Hip Flexion  4+/5  Left Hip Extension  4+/5    Left Hip ABduction  4+/5    Right Knee Flexion  4/5    Right Knee Extension  4/5   pain in RT LB   Left Knee Flexion  5/5    Left Knee Extension  5/5    Right Ankle Dorsiflexion  5/5    Left Ankle Dorsiflexion  5/5      Flexibility   Soft Tissue Assessment  /Muscle Length  yes    Hamstrings  RT 70  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      [facility] Adult PT Treatment/Exercise - 03/19/19 0001      Lumbar Exercises: Stretches   Standing Extension  5 reps;20 seconds    Press Ups  5 reps    Press Ups Limitations  x 2            Access Code: [email or web address]  URL: [email or web address]  Date: 03/19/2019  Prepared by: Voncille Lo   Exercises  Standing Lumbar Extension - 10 reps - 1 sets - 5 hold - 3x daily - 7x weekly  Prone Press Up - 5 reps - 1 sets - 5 hold - 3x daily - 7x weekly    PT Education - 03/19/19 1258    Education Details  POC  Explanations of findings  Initial Extension based exericise    Person(s) Educated  Patient    Methods  Explanation;Demonstration;Tactile cues;Verbal cues;Handout    Comprehension  Verbalized understanding;Returned demonstration       PT Short Term Goals - 03/19/19 1139      PT SHORT TERM GOAL #1   Title  Pt will be independent with initial HEP    Baseline  no knowledge    Time  3    Period  Weeks    Status  New    Target Date  04/09/19      PT SHORT TERM GOAL #2   Title  Pt will be able to bend forward and touch ankles with pain 3/10 or less    Baseline  currently 5/10 and unable to touch lower than mid shin/ 13 inches finger tip to floor    Time  3    Period  Weeks    Status  New    Target Date  04/09/19      PT SHORT TERM GOAL #3   Title  Pt will be able to carry laundry and and lean over top loader with pain 3/10 or less    Baseline  pt now 5/10 and unable  to do laundry    Time  3    Period  Weeks    Status  New    Target Date   04/09/19        PT Long Term Goals - 03/19/19 1317      PT LONG TERM GOAL #1   Title  TBD after 4th vsiit and MCD authorization re submitted if necessary             Plan - 03/19/19 1109    Clinical Impression  Statement  34 yo post partum 16 months ( 3rd child after C-section) presents with midline low back pain since November where Ms [place] was unable to carry her dtr or do any movement but especially exacerbated with flexion movements. RT midback/lowback increased with LT side bend and relieved with extensioPt presents with signs and symptoms compatible with lumbar radiculopathy due to disc derangement. Pt would benefit from skilled PT to address above impariments and functional limitations and return to pain-free PLOF.n.    Personal Factors and Comorbidities  Comorbidity 1;Comorbidity 2    Comorbidities  DM  , pancreatitis, menorhagia    Examination-Activity Limitations  Caring for Others;Stand;Sit;Toileting;Transfers   Rt back pain increases   Examination-Participation Restrictions  Cleaning    Stability/Clinical Decision Making  Stable/Uncomplicated    Clinical Decision Making  Low    Rehab Potential  Good    PT Frequency  1x / week   1-2 x a week if needed to get visits in 3 week period   PT Duration  3 weeks    PT Treatment/Interventions  ADLs/Self Care Home Management;Cryotherapy;Electrical Stimulation;Iontophoresis 4mg /ml Dexamethasone;Moist Heat;Traction;Ultrasound;Therapeutic exercise;Therapeutic activities;Functional mobility training;Neuromuscular re-education;Gait training;Patient/family education;Passive range of motion;Manual techniques;Dry needling;Taping;Spinal Manipulations    PT Next Visit Plan  basic extesnion.  Dry needling and ADL posture/body mechaninis handout    PT Home Exercise Plan  extension based initial HEP    Consulted and Agree with Plan of Care  Patient       Patient will benefit from skilled therapeutic intervention in order to improve the  following deficits and impairments:  Decreased activity tolerance, Decreased mobility, Decreased range of motion, Decreased strength, Increased muscle spasms, Postural dysfunction, Improper body mechanics, Pain  Visit Diagnosis: Acute bilateral low back pain with right-sided sciatica  Muscle weakness (generalized)     Problem List Patient Active Problem List   Diagnosis Date Noted  . Back pain 01/29/2019  . Pelvic pain in female 01/16/2019  . Menorrhagia 11/01/2018  . 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 History of splenectomy 05/01/2018  . Request for sterilization 07/20/2017  . Fibroid 05/08/2017  . Chronic pancreatitis ([place]) 05/08/2017  . History of postpartum severe pre-eclampsia 04/10/2017  . History of cesarean delivery 04/10/2017  . Diabetes ([place])   . 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 Vitamin D deficiency 12/29/2013  . Asthma 12/25/2013  . Genital warts 12/22/2011  . Pancreatic cyst 06/24/2010    Voncille Lo, PT Certified Exercise Expert for the Aging Adult  03/19/19 1:18 PM Phone: [phone] Fax: [facility] [address] [place], [place], [address] Phone: [phone]   Fax:  [phone]  Name: [name] MRN: [id number] Date of Birth: [birth year]

## 2019-03-19 NOTE — Patient Instructions (Signed)
     Voncille Lo, PT Certified Exercise Expert for the Aging Adult  03/19/19 11:39 AM Phone: [phone] Fax: [phone]

## 2019-03-20 ENCOUNTER — Other Ambulatory Visit: Payer: Self-pay | Admitting: Obstetrics & Gynecology

## 2019-03-20 NOTE — Progress Notes (Signed)
Orders for surgery 

## 2019-03-20 NOTE — Telephone Encounter (Signed)
Okay, thank you for update.  I would like to wait for CT results prior to making any decisions about prescribing any ongoing pain medication.

## 2019-03-22 ENCOUNTER — Other Ambulatory Visit: Payer: Self-pay

## 2019-03-22 ENCOUNTER — Ambulatory Visit (HOSPITAL_COMMUNITY)
Admission: RE | Admit: 2019-03-22 | Discharge: 2019-03-22 | Disposition: A | Discharge status: Home / Self Care | Payer: Medicaid Other | Source: Ambulatory Visit | Attending: Physician Assistant | Admitting: Physician Assistant

## 2019-03-22 DIAGNOSIS — K859 Acute pancreatitis without necrosis or infection, unspecified: Secondary | ICD-10-CM | POA: Insufficient documentation

## 2019-03-22 DIAGNOSIS — R109 Unspecified abdominal pain: Secondary | ICD-10-CM | POA: Diagnosis not present

## 2019-03-22 MED ORDER — IOHEXOL 300 MG/ML  SOLN
100.0000 mL | Freq: Once | INTRAMUSCULAR | Status: AC | PRN
  Administered 2019-03-22: 14:00:00 100 mL via INTRAVENOUS

## 2019-03-22 MED ORDER — SODIUM CHLORIDE (PF) 0.9 % IJ SOLN
INTRAMUSCULAR | Status: AC
  Filled 2019-03-22: qty 50

## 2019-03-23 ENCOUNTER — Other Ambulatory Visit (HOSPITAL_COMMUNITY): Payer: Medicaid Other

## 2019-03-26 ENCOUNTER — Other Ambulatory Visit: Payer: Self-pay

## 2019-03-26 ENCOUNTER — Other Ambulatory Visit (HOSPITAL_COMMUNITY)
Admission: RE | Admit: 2019-03-26 | Discharge: 2019-03-26 | Disposition: A | Discharge status: Home / Self Care | Payer: Medicaid Other | Source: Ambulatory Visit | Attending: Obstetrics & Gynecology | Admitting: Obstetrics & Gynecology

## 2019-03-26 ENCOUNTER — Ambulatory Visit (INDEPENDENT_AMBULATORY_CARE_PROVIDER_SITE_OTHER): Payer: Medicaid Other

## 2019-03-26 DIAGNOSIS — N898 Other specified noninflammatory disorders of vagina: Secondary | ICD-10-CM | POA: Diagnosis not present

## 2019-03-26 DIAGNOSIS — N949 Unspecified condition associated with female genital organs and menstrual cycle: Secondary | ICD-10-CM | POA: Diagnosis not present

## 2019-03-26 DIAGNOSIS — Z658 Other specified problems related to psychosocial circumstances: Secondary | ICD-10-CM

## 2019-03-26 NOTE — Progress Notes (Signed)
Pt here today for self-swab nurse visit. Pt complains of vaginal discharge with a mucous consistency, minimal itching, and denies any odor. Self-swab instructions given and specimen obtained. Explained to pt we will call with any abnormal results.   Pt states she experiences spotting with no relation to her period. Reports periods are severely painful and heavy; at heaviest time pt reports changing maxi pad 3-4x/hr. Pt reports ongoing pain with intercourse and urinary incontinence that began following c-section on 11/05/2017. Pt states she has been seen by multiple providers in our office and does not feel that we have addressed her concerns. CT of abdomen/pelvis completed per GI on 03/22/19. She has multiple concerns about reproductive section of results and does not want to discuss with GI provider. Pt states she may decide to seek GYN care at another practice but is willing to speak with a different provider about her concerns at this time. Pt states she would like to see Ilda Basset, MD; virtual appt made for 03/27/19 at 1500 and pt notified.   [facility] services offered to patient to provide ongoing support as patient is dealing with multiple chronic health concerns. Pt is receptive. Ambulatory referral placed and appt scheduled with [facility].   Apolonio Schneiders RN 03/26/19

## 2019-03-26 NOTE — Progress Notes (Signed)
Patient seen and assessed by nursing staff during this encounter. I have reviewed the chart and agree with the documentation and plan.  Chauncey Mann, MD 03/26/2019 1:11 PM

## 2019-03-27 ENCOUNTER — Telehealth (INDEPENDENT_AMBULATORY_CARE_PROVIDER_SITE_OTHER): Payer: Medicaid Other | Admitting: Obstetrics and Gynecology

## 2019-03-27 ENCOUNTER — Ambulatory Visit: Payer: Medicaid Other | Admitting: Physician Assistant

## 2019-03-27 ENCOUNTER — Other Ambulatory Visit: Payer: Self-pay | Admitting: Physician Assistant

## 2019-03-27 ENCOUNTER — Other Ambulatory Visit: Payer: Self-pay

## 2019-03-27 ENCOUNTER — Ambulatory Visit (HOSPITAL_BASED_OUTPATIENT_CLINIC_OR_DEPARTMENT_OTHER): Admit: 2019-03-27 | Payer: Medicaid Other | Admitting: Obstetrics & Gynecology

## 2019-03-27 ENCOUNTER — Encounter (HOSPITAL_BASED_OUTPATIENT_CLINIC_OR_DEPARTMENT_OTHER): Payer: Self-pay

## 2019-03-27 ENCOUNTER — Encounter: Payer: Self-pay | Admitting: Physician Assistant

## 2019-03-27 ENCOUNTER — Other Ambulatory Visit: Payer: Self-pay | Admitting: Family Medicine

## 2019-03-27 VITALS — BP 110/76 | HR 104 | Temp 98.7°F | Ht 61.42 in | Wt 154.5 lb

## 2019-03-27 DIAGNOSIS — N760 Acute vaginitis: Secondary | ICD-10-CM

## 2019-03-27 DIAGNOSIS — R102 Pelvic and perineal pain: Secondary | ICD-10-CM | POA: Diagnosis not present

## 2019-03-27 DIAGNOSIS — N946 Dysmenorrhea, unspecified: Secondary | ICD-10-CM | POA: Diagnosis not present

## 2019-03-27 DIAGNOSIS — K859 Acute pancreatitis without necrosis or infection, unspecified: Secondary | ICD-10-CM | POA: Diagnosis not present

## 2019-03-27 DIAGNOSIS — R1013 Epigastric pain: Secondary | ICD-10-CM

## 2019-03-27 DIAGNOSIS — D490 Neoplasm of unspecified behavior of digestive system: Secondary | ICD-10-CM | POA: Diagnosis not present

## 2019-03-27 DIAGNOSIS — Z90411 Acquired partial absence of pancreas: Secondary | ICD-10-CM | POA: Diagnosis not present

## 2019-03-27 DIAGNOSIS — B9689 Other specified bacterial agents as the cause of diseases classified elsewhere: Secondary | ICD-10-CM

## 2019-03-27 LAB — CERVICOVAGINAL ANCILLARY ONLY
Bacterial Vaginitis (gardnerella): POSITIVE — AB
Candida Glabrata: NEGATIVE
Candida Vaginitis: NEGATIVE
Chlamydia: NEGATIVE
Comment: NEGATIVE
Comment: NEGATIVE
Comment: NEGATIVE
Comment: NEGATIVE
Comment: NEGATIVE
Comment: NORMAL
Neisseria Gonorrhea: NEGATIVE
Trichomonas: NEGATIVE

## 2019-03-27 SURGERY — LAPAROSCOPY, DIAGNOSTIC
Anesthesia: Choice

## 2019-03-27 MED ORDER — HYDROCODONE-ACETAMINOPHEN 5-325 MG PO TABS: 1.0000 | ORAL_TABLET | Freq: Four times a day (QID) | ORAL | Status: DC | PRN

## 2019-03-27 MED ORDER — METRONIDAZOLE 500 MG PO TABS: 500.0000 mg | ORAL_TABLET | Freq: Two times a day (BID) | ORAL | 0 refills | Status: DC

## 2019-03-27 MED ORDER — PANCRELIPASE (LIP-PROT-AMYL) 36000-114000 UNITS PO CPEP: 72000.0000 [IU] | ORAL_CAPSULE | Freq: Three times a day (TID) | ORAL | 0 refills | Status: DC

## 2019-03-27 MED ORDER — PANTOPRAZOLE SODIUM 40 MG PO TBEC: 40.0000 mg | DELAYED_RELEASE_TABLET | Freq: Every day | ORAL | 3 refills | Status: AC

## 2019-03-27 NOTE — Progress Notes (Signed)
I connected with  [name] on 03/27/19 at  3:00 PM EST by telephone and verified that I am speaking with the correct person using two identifiers.   I discussed the limitations, risks, security and privacy concerns of performing an evaluation and management service by telephone and the availability of in person appointments. I also discussed with the patient that there may be a patient responsible charge related to this service. The patient expressed understanding and agreed to proceed.  Alric Seton, [place] 03/27/2019  3:19 PM   Had a ct scheduled by gastro and several pains with csection scar states ct showed some tings she wanna discuss. Wants to discuss this.

## 2019-03-27 NOTE — Progress Notes (Signed)
TELEHEALTH VIRTUAL GYNECOLOGY VISIT ENCOUNTER NOTE  I connected with [name] on 03/27/19 at  3:00 PM EST by telephone at home and verified that I am speaking with the correct person using two identifiers.   I discussed the limitations, risks, security and privacy concerns of performing an evaluation and management service by telephone and the availability of in person appointments. I also discussed with the patient that there may be a patient responsible charge related to this service. The patient expressed understanding and agreed to proceed.  Appointment Date: 03/27/2019  OBGYN Clinic: [facility]  Primary Care Provider: [name]  Referring Provider: [name], MD  Chief Complaint:  Chief Complaint  Patient presents with  . Follow-up    History of Present Illness: [name] is a 34 y.o. African-American [phone] (Patient's last menstrual period was 03/24/2019.), seen for the above chief complaint. 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   Patient followed by Dr. Harolyn Rutherford and then Dr. Roselie Awkward for pelvic pain and heavy and painful periods. She was to have a diagnostic laparoscopy, D&C and endometrial ablation with Dr. Roselie Awkward today but she had to cancel b/c she is having pancreas issues after taking NSAIDs for the pain and because her son is unfortunately having issues and she previously had a 12/2018 laparoscopy with Dr. Harolyn Rutherford that was cancelled (see telephone notes)  She called yesterday and requested and appointment today with a new GYN to go over her CT scan from her GI on 1/15. She states she continues to have painful and heavy cycles. She states that she does not do well with depo provera, OCPs or the IUD.    Review of Systems: Pertinent items are noted  in HPI.   Patient Active Problem List   Diagnosis Date Noted  . History of partial pancreatectomy 03/27/2019  . Back pain 01/29/2019  . Pelvic pain in female 01/16/2019  . Menorrhagia 11/01/2018  . 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 History of splenectomy 05/01/2018  . Request for sterilization 07/20/2017  . Fibroid 05/08/2017  . Chronic pancreatitis ([place]) 05/08/2017  . History of postpartum severe pre-eclampsia 04/10/2017  . History of cesarean delivery 04/10/2017  . Diabetes ([place])   . 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 Vitamin D deficiency 12/29/2013  . Asthma 12/25/2013  . Genital warts 12/22/2011  . Pancreatic cyst 06/24/2010    Past Medical History:  Past Medical History:  Diagnosis Date  . Alcohol abuse   . Anemia   . Asthma    rarely uses inhaler  . Bipolar disorder ([place])    HX PPD after 2014 delivery, no meds  . Bronchitis   . Diabetes mellitus without complication ([place])    partial pancreatectomy 2012-glyburide  . Fibroid   . Hypertension   . Hypertensive retinopathy    OU  . Missed ab    x 2, one requiring D & E  . Pancreatic cyst   . Pancreatitis    diet controlled  . Pelvic inflammatory disease   . Plantar fasciitis   . Polycystic ovary syndrome   . Renal cyst   . Sickle cell trait [facility])     Past Surgical History:  Past Surgical History:  Procedure Laterality Date  . CESAREAN SECTION N/A 05/23/2012   Procedure: CESAREAN SECTION;  Surgeon: Lahoma Crocker,  MD;  Location: [facility];  Service: Obstetrics;  Laterality: N/A;  primary  . CESAREAN SECTION N/A 04/18/2014   Procedure: REPEAT CESAREAN SECTION;  Surgeon: Shelly Bombard, MD;  Location: [facility];  Service: Obstetrics;  Laterality: N/A;  . CESAREAN SECTION N/A 11/05/2017   Procedure: REPEAT CESAREAN SECTION;  Surgeon: Woodroe Mode, MD;  Location: [facility];  Service: Obstetrics;  Laterality: N/A;  . DILATION AND CURETTAGE OF UTERUS      for MAB  . EUS N/A 06/11/2015   Procedure:  UPPER ENDOSCOPIC ULTRASOUND (EUS) RADIAL;  Surgeon: Milus Banister, MD;  Location: [facility];  Service: Endoscopy;  Laterality: N/A;  . PANCREAS SURGERY  2012   s/p partial pancreatectomy  . SPLENECTOMY, TOTAL  2012  . WISDOM TOOTH EXTRACTION      Past Obstetrical History:  OB History  Gravida Para Term Preterm AB Living  7 3 3   4 3   SAB TAB Ectopic Multiple Live Births  3 1   0 3    # Outcome Date GA Lbr Len/2nd Weight Sex Delivery Anes PTL Lv  7 Term 11/05/17 [redacted]w[redacted]d  7 lb 0.5 oz (3.19 kg) F CS-LTranv Spinal  LIV  6 SAB 03/23/16 [redacted]w[redacted]d    SAB     5 Term 04/18/14 [redacted]w[redacted]d  8 lb 10.6 oz (3.93 kg) M CS-Vac Spinal  LIV  4 Term 05/23/12 [redacted]w[redacted]d  9 lb 14.2 oz (4.485 kg) M CS-LTranv Spinal  LIV     Birth Comments: No problems at birth  3 TAB           2 SAB           1 SAB             Past Gynecological History: As per HPI. History of Pap Smear(s): Yes.   Last pap 2017, which was neg and hpv neg  Social History:  Social History   Socioeconomic History  . Marital status: Single    Spouse name: Not on file  . Number of children: 2  . Years of education: Not on file  . Highest education level: Not on file  Occupational History  . Occupation: Homemaker  Tobacco Use  . Smoking status: Current Every Day Smoker    Packs/day: 0.25    Years: 13.00    Pack years: 3.25    Types: Cigarettes  . Smokeless tobacco: Never Used  . Tobacco comment: smokes 4 cigs/day, tobacco info given 04/17/15  Substance and Sexual Activity  . Alcohol use: No    Alcohol/week: 0.0 standard drinks    Comment: social   . Drug use: No  . Sexual activity: Not Currently    Partners: Male    Birth control/protection: None  Other Topics Concern  . Not on file  Social History Narrative  . Not on file   Social Determinants of Health   Financial Resource Strain:   . Difficulty of Paying Living Expenses: Not on file  Food Insecurity:   . Worried About Charity fundraiser in the Last Year: Not on file  .  Ran Out of Food in the Last Year: Not on file  Transportation Needs:   . Lack of Transportation (Medical): Not on file  . Lack of Transportation (Non-Medical): Not on file  Physical Activity:   . Days of Exercise per Week: Not on file  . Minutes of Exercise per Session: Not on file  Stress:   . Feeling of Stress : Not  on file  Social Connections:   . Frequency of Communication with Friends and Family: Not on file  . Frequency of Social Gatherings with Friends and Family: Not on file  . Attends Religious Services: Not on file  . Active Member of Clubs or Organizations: Not on file  . Attends Archivist Meetings: Not on file  . Marital Status: Not on file  Intimate Partner Violence:   . Fear of Current or Ex-Partner: Not on file  . Emotionally Abused: Not on file  . Physically Abused: Not on file  . Sexually Abused: Not on file    Family History:  Family History  Problem Relation Age of Onset  . Diabetes Mother   . Hypertension Mother   . Asthma Father   . Glaucoma Sister   . Glaucoma Paternal Grandmother   . Colon cancer Neg Hx     Medications [name] had no medications administered during this visit. Current Outpatient Medications  Medication Sig Dispense Refill  . acetaminophen (TYLENOL) 500 MG tablet Take 500-1,000 mg by mouth every 6 (six) hours as needed for mild pain, moderate pain or headache.     . lipase/protease/amylase (CREON) 36000 UNITS CPEP capsule Take 2 capsules (72,000 Units total) by mouth 3 (three) times daily with meals. 1 capsule with snack 180 capsule 0  . metFORMIN (GLUCOPHAGE) 500 MG tablet Take 1 tablet (500 mg total) by mouth 2 (two) times daily. 60 tablet 5  . metroNIDAZOLE (FLAGYL) 500 MG tablet Take 1 tablet (500 mg total) by mouth 2 (two) times daily. 14 tablet 0  . Prenatal Vit-Fe Fumarate-FA (PRENATAL MULTIVITAMIN) TABS tablet Take 1 tablet by mouth daily at 12 noon.    . RESTASIS 0.05 % ophthalmic emulsion INSTILL 1  DROP INTO BOTH EYES TWICE A DAY    . ondansetron (ZOFRAN) 4 MG tablet Take 1 tablet (4 mg total) by mouth every 8 (eight) hours as needed for nausea or vomiting. (Patient not taking: Reported on 03/27/2019) 30 tablet 1  . pantoprazole (PROTONIX) 40 MG tablet Take 1 tablet (40 mg total) by mouth daily. (Patient not taking: Reported on 03/27/2019) 60 tablet 3   Current Facility-Administered Medications  Medication Dose Route Frequency Provider Last Rate Last Admin  . HYDROcodone-acetaminophen (NORCO/VICODIN) 5-325 MG per tablet 1 tablet  1 tablet Oral Q6H PRN E[place], Amy S, PA-C        Allergies Dilaudid [hydromorphone hcl]   Physical Exam:  LMP 03/24/2019  There is no height or weight on file to calculate BMI. General:  Alert, oriented and cooperative.   Mental Status: Normal mood and affect perceived. Normal judgment and thought content.  Physical exam deferred due to nature of the encounter    Laboratory: no new labs Normal tsh 2019  Radiology:   CLINICAL DATA:  Persistent abdominal pain. History of pancreatitis. Nausea. History of splenectomy and partial pancreatectomy.  EXAM: CT ABDOMEN AND PELVIS WITH CONTRAST  TECHNIQUE: Multidetector CT imaging of the abdomen and pelvis was performed using the standard protocol following bolus administration of intravenous contrast.  CONTRAST:  191mL OMNIPAQUE IOHEXOL 300 MG/ML  SOLN  COMPARISON:  12/19/2017 abdominal MRI.  Most recent CT 08/22/2012  FINDINGS: Lower chest: Clear lung bases. Normal heart size without pericardial or pleural effusion.  Hepatobiliary: Variant lateral segment left liver lobe extending in the left upper quadrant. Normal gallbladder, without biliary ductal dilatation.  Pancreas: Partial pancreatectomy involving the body and tail. The pancreatic head and uncinate process or normal.  Spleen: Surgically absent  Adrenals/Urinary Tract: Normal adrenal glands. Mildly scarred upper pole right  kidney. Normal left kidney, without hydronephrosis. Normal urinary bladder.  Stomach/Bowel: Proximal gastric underdistention. Apparent borderline wall thickening is felt to be secondary, including on 12/02. Appearance is relatively similar to on the remote prior CT. There is also underdistention of the gastric antrum with apparent mild wall thickening, including on 17/2.  Colonic stool burden suggests constipation. Normal terminal ileum and appendix. Normal small bowel.  Vascular/Lymphatic: Normal caliber of the aorta and branch vessels. No abdominopelvic adenopathy.  Reproductive: Mildly globular heterogeneous uterus. Heterogeneous enhancement within the pelvic cul-de-sac srepresent a corpus luteal cyst, arising from the left ovary including at 1.7 x 1.5 cm on 61/2.  Other: Trace free pelvic fluid is likely physiologic. No free intraperitoneal air.  Musculoskeletal: No acute osseous abnormality.  IMPRESSION: 1. Partial pancreatectomy, without evidence of acute pancreatitis. 2.  Possible constipation. 3. Areas of gastric wall borderline and mild thickening are at least partially felt to be due to underdistention. Cannot exclude gastritis. 4. Left ovarian corpus luteal cyst. 5. Globular, enlarged uterus as can be seen with multiple small fibroids or uterine adenomyosis.   Electronically Signed   By: Abigail Miyamoto M.D.   On: 03/22/2019 14:16  CLINICAL DATA:  Menorrhagia  EXAM: TRANSABDOMINAL AND TRANSVAGINAL ULTRASOUND OF PELVIS  TECHNIQUE: Both transabdominal and transvaginal ultrasound examinations of the pelvis were performed. Transabdominal technique was performed for global imaging of the pelvis including uterus, ovaries, adnexal regions, and pelvic cul-de-sac. It was necessary to proceed with endovaginal exam following the transabdominal exam to visualize the endometrium.  COMPARISON:  None  FINDINGS: Uterus  Measurements: 9.6 x 5.8 x 6.2 cm =  volume: 179.5 mL. There is a possible 1.2 x 0.8 x 0.9 cm intramural fibroid within the anterior right uterine fundus.  Endometrium  Thickness: 8 mm. No focal abnormality visualized. Small amount of peripheral calcifications.  Right ovary  Measurements: 4.1 x 2.5 x 3.3 cm = volume: 17.7 mL. Normal appearance/no adnexal mass.  Left ovary  Measurements: 4.1 x 1.8 x 3.0 cm = volume: 11.7 mL. Normal appearance/no adnexal mass.  Other findings  No abnormal free fluid.  IMPRESSION: Endometrium measures 8 mm. If bleeding remains unresponsive to hormonal or medical therapy, sonohysterogram should be considered for focal lesion work-up. (Ref: Radiological Reasoning: Algorithmic Workup of Abnormal Vaginal Bleeding with Endovaginal Sonography and Sonohysterography. [id number] [id number])  Probable small fibroid.   Electronically Signed   By: Lovey Newcomer M.D.   On: 12/03/2018 16:52  Assessment: pt stable  Plan:  I d/w her that it's always difficult when it comes with pelvic pain. In terms of bleeding, that can be fixed with surgery but it's always difficult with pelvic pain. Presumably, if the pain is coming from the bleeding or at least it is contributing to it, fixing the bleeding should help with the pain. A  I reviewed the radiology findings and d/w her that based on her hx and CT findings that she does likely have adenomyosis and uterine sparing surgery hasn't been shown to be definitively effective. Based on her history, I told her that a hysterectomy is a very reasonable option; she does not recall being offered this last year by Dr. Harolyn Rutherford despite what the note says. I told her that she is high risk for conversion to open and risks of surgery d/w her, including may not help with the pain.   Pt is amenable with proceeding and d/w her that  surgery likely to be pushed out due to covid OR restrictions.  Request sent for TLH/BS/cysto   I discussed the assessment and  treatment plan with the patient. The patient was provided an opportunity to ask questions and all were answered. The patient agreed with the plan and demonstrated an understanding of the instructions.   The patient was advised to call back or seek an in-person evaluation/go to the ED if the symptoms worsen or if the condition fails to improve as anticipated.  I provided 30 minutes of non-face-to-face time during this encounter. The visit was done via a MyChart visit.   Durene Romans MD Attending [facility] for [facility])

## 2019-03-27 NOTE — Progress Notes (Signed)
Subjective:    Patient ID: [name], female    DOB: 07/17/1985, 34 y.o.   MRN: [id number]  HPI [name] is a pleasant 34 year old African-American female, known to Dr. Hilarie Fredrickson and myself.  She was last seen in the office in September 2019.  She comes in today with complaints of epigastric and back pain since mid December 2020. 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  She also had splenectomy at that time.  She has had complaints of upper abdominal pain over the past few years that she feels are consistent with her pancreatic pain however been unable to demonstrate chronic pancreatitis.  She had MRI/MRCP which was negative in fall 2019.  EUS in 2017 per Dr. Ardis Hughs showed no evidence of chronic pancreatitis and normal-appearing gallbladder and CBD. Patient says she had recurrence of pain week before Christmas and describes this as a severe pain in the epigastrium radiating around and through to her back which lasted for 15 to 20 minutes, then the severity subsided but she was left with ongoing less severe pain.  She started back on Creon at that time.  She had another bad episode on New Year's Eve.  She says the only thing that is different about this episode was that she felt it bilaterally in her upper abdomen and then into her back.  She has had nausea but no vomiting.  She can eat when the pain is not severe but says the pain is never completely going away at this point.  No fever or chills.  She describes it as a sharp and burning type pain with aching in her back.  She says earlier this week she had more severe pain and was doubled up on the couch crying.  She has 3 children and says she has difficult time taking care of her kids when she is hurting.  We had called in a prescription for tramadol which gave her palpitations and she stopped.  She mentions today that she had been taking high-dose naproxen,  prescription strength over the past couple of months because of bulging lumbar disks.  She has been undergoing physical therapy and says that she needs the naproxen when she does physical therapy.  She stopped this a week or so ago. Labs were drawn on 03/07/2019 showed lipase of 8, unremarkable c-Met, hemoglobin 11.6 MCV of 69.  Repeat CT of the abdomen and pelvis with contrast on 03/22/1999 showed partial pancreatectomy of the body and tail of the pancreas, absent spleen she was felt to have gastric under distention and therefore possible gastric wall thickening/gastritis mentioned as well as constipation.  She has an enlarged fibroid uterus.    Review of Systems Pertinent positive and negative review of systems were noted in the above HPI section.  All other review of systems was otherwise negative.  Outpatient Encounter Medications as of 03/27/2019  Medication Sig  . acetaminophen (TYLENOL) 500 MG tablet Take 500-1,000 mg by mouth every 6 (six) hours as needed for mild pain, moderate pain or headache.   . lipase/protease/amylase (CREON) 36000 UNITS CPEP capsule Take 2 capsules (72,000 Units total) by mouth 3 (three) times daily with meals. 1 capsule with snack  . metFORMIN (GLUCOPHAGE) 500 MG tablet Take 1 tablet (500 mg total) by mouth 2 (two) times daily.  . ondansetron (ZOFRAN) 4 MG tablet Take 1 tablet (4 mg total) by mouth every 8 (eight) hours as  needed for nausea or vomiting.  Marland Kitchen oxyCODONE-acetaminophen (PERCOCET/ROXICET) 5-325 MG tablet Take 1-2 tablets by mouth every 8 (eight) hours as needed for severe pain.  . Prenatal Vit-Fe Fumarate-FA (PRENATAL MULTIVITAMIN) TABS tablet Take 1 tablet by mouth daily at 12 noon.  . RESTASIS 0.05 % ophthalmic emulsion INSTILL 1 DROP INTO BOTH EYES TWICE A DAY  . [DISCONTINUED] lipase/protease/amylase (CREON) 36000 UNITS CPEP capsule Take 2 capsules (72,000 Units total) by mouth 3 (three) times daily with meals. 1 capsule with snack  . pantoprazole  (PROTONIX) 40 MG tablet Take 1 tablet (40 mg total) by mouth daily.  . [DISCONTINUED] solifenacin (VESICARE) 5 MG tablet Take 1 tablet (5 mg total) by mouth daily. (Patient not taking: Reported on 03/19/2019)  . [DISCONTINUED] tranexamic acid (LYSTEDA) 650 MG TABS tablet Take 2 tablets (1,300 mg total) by mouth 3 (three) times daily. Take during menses for a maximum of five days (Patient not taking: Reported on 01/16/2019)   No facility-administered encounter medications on file as of 03/27/2019.   Allergies  Allergen Reactions  . Dilaudid [Hydromorphone Hcl] Itching and Other (See Comments)    Reaction:  Hallucinations   Patient Active Problem List   Diagnosis Date Noted  . History of partial pancreatectomy 03/27/2019  . Back pain 01/29/2019  . Pelvic pain in female 01/16/2019  . Menorrhagia 11/01/2018  . 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 History of splenectomy 05/01/2018  . Request for sterilization 07/20/2017  . Fibroid 05/08/2017  . Chronic pancreatitis ([place]) 05/08/2017  . History of postpartum severe pre-eclampsia 04/10/2017  . History of cesarean delivery 04/10/2017  . Diabetes ([place])   . 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 Vitamin D deficiency 12/29/2013  . Asthma 12/25/2013  . Genital warts 12/22/2011  . Pancreatic cyst 06/24/2010   Social History   Socioeconomic History  . Marital status: Single    Spouse name: Not on file  . Number of children: 2  . Years of education: Not on file  . Highest education level: Not on file  Occupational History  . Occupation: Homemaker  Tobacco Use  . Smoking status: Current Every Day Smoker    Packs/day: 0.25    Years: 13.00    Pack years: 3.25    Types: Cigarettes  . Smokeless tobacco: Never Used  . Tobacco comment: smokes 4 cigs/day, tobacco info given 04/17/15  Substance and Sexual Activity  . Alcohol use: No    Alcohol/week: 0.0 standard drinks    Comment: social   . Drug use: No  . Sexual activity: Not Currently     Partners: Male    Birth control/protection: None  Other Topics Concern  . Not on file  Social History Narrative  . Not on file   Social Determinants of Health   Financial Resource Strain:   . Difficulty of Paying Living Expenses: Not on file  Food Insecurity:   . Worried About Charity fundraiser in the Last Year: Not on file  . Ran Out of Food in the Last Year: Not on file  Transportation Needs:   . Lack of Transportation (Medical): Not on file  . Lack of Transportation (Non-Medical): Not on file  Physical Activity:   . Days of Exercise per Week: Not on file  . Minutes of Exercise per Session: Not on file  Stress:   . Feeling of Stress : Not on file  Social Connections:   . Frequency of Communication with Friends and Family: Not on file  . Frequency  of Social Gatherings with Friends and Family: Not on file  . Attends Religious Services: Not on file  . Active Member of Clubs or Organizations: Not on file  . Attends Archivist Meetings: Not on file  . Marital Status: Not on file  Intimate Partner Violence:   . Fear of Current or Ex-Partner: Not on file  . Emotionally Abused: Not on file  . Physically Abused: Not on file  . Sexually Abused: Not on file    [name]'s family history includes Asthma in her father; Diabetes in her mother; Glaucoma in her paternal grandmother and sister; Hypertension in her mother.      Objective:    Vitals:   03/27/19 0929  BP: 110/76  Pulse: (!) 104  Temp: 98.7 F (37.1 C)    Physical Exam Well-developed well-nourished young African-American female in no acute distress.  Weight, 154 BMI 28.8 tearful  HEENT; nontraumatic normocephalic, EOMI, PER R LA, sclera anicteric. Oropharynx; not examined/mask/Covid Neck; supple, no JVD Cardiovascular; regular rate and rhythm with S1-S2, no murmur rub or gallop Pulmonary; Clear bilaterally Abdomen; soft, tender, across the epigastrium, nondistended, no palpable mass or  hepatosplenomegaly, bowel sounds are active Rectal; not done Skin; benign exam, no jaundice rash or appreciable lesions Extremities; no clubbing cyanosis or edema skin warm and dry Neuro/Psych; alert and oriented x4, grossly nonfocal mood and affect appropriate       Assessment & Plan:   #32 34 year old African-American female status post distal pancreatectomy 2012 for mucinous cystic neoplasm.  Patient has had episodes of abdominal pain off and on over the past couple of years felt consistent with pancreatitis though we have been unable to document evidence for chronic pancreatitis. Presents now with recurrent symptoms over the past month with episodes of severe pain, and ongoing lower level epigastric pain radiating to the back with nausea. Recent labs and CT did not show any evidence of active pancreatitis, possible mild gastric wall thickening/gastritis.  Is unclear at this time whether her pain is pancreatic in etiology versus possible gastropathy/peptic ulcer disease with recent high-dose NSAID use.  #2 adult onset diabetes mellitus  Plan; will schedule for MRI/MRCP. Bland diet, patient had asked whether she needs to back off to liquids when she is having severe pain.  I think she can stay on a bland lower fat diet. Zofran 4 mg every 6 hours as needed for pain Continue Creon 36,000/2 p.o. with meals and 1 p.o. with snacks Start  Protonix 40 mg p.o. twice daily x1 month then daily Stop naproxen  Hydrocodone 5/325 1 p.o. every 6 hours as needed for pain #30 and no refills.  I specifically discussed judicious use of hydrocodone instructed patient to reserve for her more severe episodes of pain.  We reviewed the state database earlier today she has not had any recent narcotic prescriptions.  If MRI is unrevealing and symptoms are persisting despite high-dose PPI she may need EGD with Dr. Donnella Sham.  Jennifer Vittitow Genia Harold PA-C 03/27/2019   Cc: [name], MD

## 2019-03-27 NOTE — Patient Instructions (Addendum)
Start Protonix 40mg  twice daily for 1 month. Then decrease to once daily therefore after.   Continue same dosing for Creon.   Stop Naproxen.   We have sent the following medications to your pharmacy for you to pick up at your convenience: Protonix , Hydrocodone, Creon   You have been scheduled for an MRI/MRCP at [facility] on 04/05/2019. Your appointment time is 7:00am. Please arrive 15 minutes prior to your appointment time for registration purposes. Please make certain not to have anything to eat or drink 4 hours prior to your test. In addition, if you have any metal in your body, have a pacemaker or defibrillator, please be sure to let your ordering physician know. This test typically takes 45 minutes to 1 hour to complete. Should you need to reschedule, please call [phone] to do so.    If you are age 33 or younger, your body mass index should be between 19-25. Your Body mass index is 28.8 kg/m. If this is out of the aformentioned range listed, please consider follow up with your Primary Care Provider.    Due to recent changes in healthcare laws, you may see the results of your imaging and laboratory studies on MyChart before your provider has had a chance to review them.  We understand that in some cases there may be results that are confusing or concerning to you. Not all laboratory results come back in the same time frame and the provider may be waiting for multiple results in order to interpret others.  Please give [place] 48 hours in order for your provider to thoroughly review all the results before contacting the office for clarification of your results.    Thank you for choosing me and [facility].  Amy Esterwood-PA

## 2019-03-28 ENCOUNTER — Other Ambulatory Visit: Payer: Self-pay

## 2019-03-28 ENCOUNTER — Encounter: Payer: Self-pay | Admitting: Obstetrics and Gynecology

## 2019-03-28 DIAGNOSIS — N946 Dysmenorrhea, unspecified: Secondary | ICD-10-CM | POA: Insufficient documentation

## 2019-03-28 HISTORY — DX: Dysmenorrhea, unspecified: N94.6

## 2019-03-28 MED ORDER — HYDROCODONE-ACETAMINOPHEN 5-325 MG PO TABS: 1.0000 | ORAL_TABLET | Freq: Four times a day (QID) | ORAL | 0 refills | Status: DC | PRN

## 2019-03-28 NOTE — Progress Notes (Signed)
Addendum: Reviewed and agree with assessment and management plan. Yassen Kinnett M, MD  

## 2019-03-28 NOTE — Addendum Note (Signed)
Addended byDebbe Mounts on: 03/28/2019 11:51 AM   Modules accepted: Orders

## 2019-04-01 NOTE — BH Specialist Note (Signed)
Integrated Behavioral Health via Telemedicine Video Visit  04/01/2019 [name]  Number of [place] visits: 1 Session Start time: 2:18  Session End time: 2:40 Total time: 22  Referring Provider: Barrington Ellison, MD Type of Visit: Video Patient/Family location: [facility] Provider location: [facility] All persons participating in visit: [name] and [place]    Confirmed patient's address: Yes  Confirmed patient's phone number: Yes  Any changes to demographics: No   Confirmed patient's insurance: Yes  Any changes to patient's insurance: No   Discussed confidentiality: Yes   I connected with [name]  by a video enabled telemedicine application and verified that I am speaking with the correct person using two identifiers.     I discussed the limitations of evaluation and management by telemedicine and the availability of in person appointments.  I discussed that the purpose of this visit is to provide behavioral health care while limiting exposure to the novel coronavirus.   Discussed there is a possibility of technology failure and discussed alternative modes of communication if that failure occurs.  I discussed that engaging in this video visit, they consent to the provision of behavioral healthcare and the services will be billed under their insurance.  Patient and/or legal guardian expressed understanding and consented to video visit: Yes   PRESENTING CONCERNS: Patient and/or family reports the following symptoms/concerns: Pt states her primary concern today is stress over ongoing health issues with chronic pain, and upcoming surgery; would like to begin ongoing therapy.  Duration of problem: -; Severity of problem: severe  STRENGTHS (Protective Factors/Coping Skills): Strong self-awareness, hopeful outlook, and supportive family  GOALS ADDRESSED: Patient will: 1.  Reduce symptoms of: stress  2.   Increase knowledge and/or ability of: stress reduction  3.  Demonstrate ability to: Increase healthy adjustment to current life circumstances  INTERVENTIONS: Interventions utilized:  Supportive Counseling Standardized Assessments completed: not given today  ASSESSMENT: Patient currently experiencing Psychosocial stress.   Patient may benefit from psychoeducation and brief therapeutic interventions regarding coping with symptoms of life stress.  PLAN: 1. Follow up with behavioral health clinician on : Two weeks for very brief check-in 2. Behavioral recommendations:  -Continue with plan to discuss options for help with childcare for after surgery with aunt and immediate family -Establish care with ongoing therapy as discussed, with therapist of choice 3. Referral(s): [place] (In Clinic) and Counselor  I discussed the assessment and treatment plan with the patient and/or parent/guardian. They were provided an opportunity to ask questions and all were answered. They agreed with the plan and demonstrated an understanding of the instructions.   They were advised to call back or seek an in-person evaluation if the symptoms worsen or if the condition fails to improve as anticipated.  Caroleen Hamman [facility]  Depression screen [facility] 2/9 01/28/2019 05/01/2018 01/16/2018 10/31/2017 10/25/2017  Decreased Interest 1 1 2 1  0  Down, Depressed, Hopeless 0 1 1 0 0  PHQ - 2 Score 1 2 3 1  0  Altered sleeping 1 - 0 0 1  Tired, decreased energy 1 0 1 1 1   Change in appetite 0 1 1 0 0  Feeling bad or failure about yourself  0 0 1 0 0  Trouble concentrating 0 0 1 1 0  Moving slowly or fidgety/restless 0 0 1 0 0  Suicidal thoughts 0 0 0 0 0  PHQ-9 Score 3 - 8 3 2   Difficult doing work/chores Somewhat  difficult Somewhat difficult - - -  Some recent data might be hidden   GAD 7 : Generalized Anxiety Score 01/16/2018 10/31/2017 10/25/2017 10/04/2017  Nervous, Anxious, on Edge 2 0 0 0   Control/stop worrying 2 1 0 1  Worry too much - different things 2 0 0 1  Trouble relaxing 1 0 0 0  Restless 1 1 0 0  Easily annoyed or irritable 2 1 1 1   Afraid - awful might happen 3 0 0 0  Total GAD 7 Score 13 3 1  3

## 2019-04-03 ENCOUNTER — Ambulatory Visit (INDEPENDENT_AMBULATORY_CARE_PROVIDER_SITE_OTHER): Payer: Medicaid Other | Admitting: Clinical

## 2019-04-03 ENCOUNTER — Other Ambulatory Visit: Payer: Self-pay

## 2019-04-03 DIAGNOSIS — Z658 Other specified problems related to psychosocial circumstances: Secondary | ICD-10-CM

## 2019-04-03 NOTE — Patient Instructions (Addendum)
Some Therapy Options Available:     [facility] Mon-Fri: 9am-5pm  [address], [place], [place] (phone); [phone]  [email or web address]  *Accepts Medicaid  [facility] [place] [facility], 8:30am-12pm/1pm-2:30pm [address], [place], [place] (phone); [phone] (fax) [email or web address]  *Accepts Medicaid, sliding-scale*Bilingual services available  [facility]* Mon-Fri, 8am-7pm [place], [place]  [phone](phone); [phone]) www.[email or web address]  *Accepts Medicaid *Bilingual services available  [facility] Mon-Fri: 8am-5pm, Saturday by appointment only [place], [place], [place] (phone); [phone] (fax) [email or web address]   [facility] Mon-Fri: 9am-5:30pm [address] [place], [place], [place] (phone), [phone] (fax) After hours crisis line: [phone] [email or web address]  *Accepts Medicare and Medicaid  [facility] Same day access hours: Mon-Fri, 8:30-3pm Crisis hours: Mon-Fri, 8am-8pm [address], [place], [place] (phone); [phone] (fax) [email or web address]  *Accepts Medicaid and Medicare  [facility] Mon, [place], Fri: 9am-9pm Tues, Thurs: 9am-6pm [place], [place], [place] (phone); [phone] (fax) [email or web address]  *(Accepts Medicare and Medicaid; payment plans available)*Bilingual services available  [facility] Mon-Thurs: 8am-8pm [address] [place], [place], [place] (phone); [phone] (fax) [email or web address]  *Accepts Medicaid*Bilingual services available  [facility] [address], [place], [place], [place]  (phone) [email or web address].[email or web address]  *Accepts Medicaid *Bilingual services available  [facility] Mon-Fri, 9am-4:45pm [address] [place], [place] (phone); [phone] (fax) [email or web address]  *Accepts Medicare  [facility] Mon-Thurs: 8:30-8pm, Fri: 8:30am-7pm [address], [place], [place] (3rd floor) [phone] (phone); [phone] (fax) [email or web address]  *Accepts Medicaid; income-based reduced rates available  [facility] Mon-Fri: 8am-5pm [address] [place], [place], [place] (phone); [phone] (fax) [email or web address]  *Accepts Medicaid*Bilingual services available

## 2019-04-04 ENCOUNTER — Encounter: Payer: Self-pay | Admitting: Physical Therapy

## 2019-04-04 ENCOUNTER — Other Ambulatory Visit: Payer: Self-pay

## 2019-04-04 ENCOUNTER — Ambulatory Visit: Payer: Medicaid Other | Attending: Internal Medicine | Admitting: Physical Therapy

## 2019-04-04 DIAGNOSIS — M5441 Lumbago with sciatica, right side: Secondary | ICD-10-CM | POA: Insufficient documentation

## 2019-04-04 DIAGNOSIS — M6281 Muscle weakness (generalized): Secondary | ICD-10-CM | POA: Diagnosis not present

## 2019-04-04 NOTE — Therapy (Signed)
[place], [place], [address] Phone: [phone]   Fax:  [phone]  Physical Therapy Treatment  Patient Details  Name: [name] MRN: [id number] Date of Birth: 04-04-1985 Referring Provider (PT): Karen Kays MD   Encounter Date: 04/04/2019  PT End of Session - 04/04/19 1154    Visit Number  2    Number of Visits  4    Date for PT Re-Evaluation  04/09/19    Authorization Type  MCD  1st authorization submitted 03-19-2019    Authorization Time Period  Approved 3 PT visist 04/03/19 to 04/23/19    PT Start Time  1102    PT Stop Time  1156    PT Time Calculation (min)  54 min    Activity Tolerance  Patient tolerated treatment well    Behavior During Therapy  [facility] for tasks assessed/performed       Past Medical History:  Diagnosis Date  . Alcohol abuse   . Anemia   . Asthma    rarely uses inhaler  . Bipolar disorder ([place])    HX PPD after 2014 delivery, no meds  . Bronchitis   . 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 Fibroid   . Hypertension   . Hypertensive retinopathy    OU  . Missed ab    x 2, one requiring D & E  . Pancreatic cyst   . Pancreatitis    diet controlled  . Pelvic inflammatory disease   . Plantar fasciitis   . Polycystic ovary syndrome   . 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 Renal cyst   . Sickle cell trait [facility])     Past Surgical History:  Procedure Laterality Date  . CESAREAN SECTION N/A 05/23/2012   Procedure: CESAREAN SECTION;  Surgeon: Lahoma Crocker, MD;  Location: [facility];  Service: Obstetrics;  Laterality: N/A;  primary  . CESAREAN SECTION N/A 04/18/2014   Procedure: REPEAT CESAREAN SECTION;  Surgeon: Shelly Bombard, MD;  Location: [facility];  Service: Obstetrics;  Laterality: N/A;  . CESAREAN SECTION N/A 11/05/2017   Procedure: REPEAT CESAREAN SECTION;  Surgeon: Woodroe Mode, MD;  Location: [place];  Service: Obstetrics;  Laterality: N/A;  . DILATION AND CURETTAGE OF UTERUS      for MAB  . EUS N/A 06/11/2015   Procedure: UPPER ENDOSCOPIC ULTRASOUND (EUS) RADIAL;  Surgeon: Milus Banister, MD;  Location: [facility];  Service: Endoscopy;  Laterality: N/A;  . PANCREAS SURGERY  2012   s/p partial pancreatectomy  . SPLENECTOMY, TOTAL  2012  . WISDOM TOOTH EXTRACTION      There were no vitals filed for this visit.  Subjective Assessment - 04/04/19 1105    Subjective  My left side is so much better but I still have a little pain in my RT side    Pertinent History  DM, menorhagia    Limitations  Sitting;Standing;Walking    Diagnostic tests  MRI1. At L5-S1 there is a broad central disc protrusion.    Patient Stated Goals  to learn HEP and ways I can prevent causing pain    Currently in Pain?  Yes    Pain Score  3     Pain Location  Back    Pain Orientation  Mid;Lower    Pain Descriptors / Indicators  Aching    Pain Type  Acute pain    Pain Onset  More than a  month ago                       [facility] Adult PT Treatment/Exercise - 04/04/19 0001      Self-Care   Self-Care  Other Self-Care Comments;ADL's;Lifting;Posture    ADL's  discussed pain management in home and kitchen    Lifting  educated on lfiting children    Posture  posture in lifting and picking up items in home    Other Self-Care Comments   educated on TPDN and aftercare and posture      Lumbar Exercises: Stretches   Standing Extension  5 reps;20 seconds    Press Ups  5 reps    Press Ups Limitations  x 2      Lumbar Exercises: Standing   Other Standing Lumbar Exercises  hip hinge with dowel  2 x 10       Lumbar Exercises: Prone   Other Prone Lumbar Exercises  10 x RT/LT hip extension each    Other Prone Lumbar Exercises  10x RT/LT hip extension with knee flexion each,   superman 10 x in prone      Modalities   Modalities  Moist Heat      Moist Heat Therapy   Number Minutes Moist  Heat  10 Minutes    Moist Heat Location  Lumbar Spine   RT QL     Manual Therapy   Manual Therapy  Soft tissue mobilization;Myofascial release    Manual therapy comments  skilled palpation with TPDN    Soft tissue mobilization  RT QL RT gluteals    Myofascial Release  RT QL       Trigger Point Dry Needling - 04/04/19 0001    Consent Given?  Yes    Education Handout Provided  Yes    Muscles Treated Back/Hip  Quadratus lumborum    Dry Needling Comments  .30  100 mm    Other Dry Needling  One time    Pt becanme anxious and asked to stop   Quadratus Lumborum Response  Twitch response elicited;Palpable increased muscle length           PT Education - 04/04/19 1117    Education Details  added to HEP with extension based HEP  educated on posture /body mechanics and TPDN    Person(s) Educated  Patient    Methods  Explanation;Demonstration;Tactile cues;Verbal cues;Handout    Comprehension  Verbalized understanding;Returned demonstration       PT Short Term Goals - 03/19/19 1139      PT SHORT TERM GOAL #1   Title  Pt will be independent with initial HEP    Baseline  no knowledge    Time  3    Period  Weeks    Status  New    Target Date  04/09/19      PT SHORT TERM GOAL #2   Title  Pt will be able to bend forward and touch ankles with pain 3/10 or less    Baseline  currently 5/10 and unable to touch lower than mid shin/ 13 inches finger tip to floor    Time  3    Period  Weeks    Status  New    Target Date  04/09/19      PT SHORT TERM GOAL #3   Title  Pt will be able to carry laundry and and lean over top loader with pain 3/10 or less    Baseline  pt  now 5/10 and unable  to do laundry    Time  3    Period  Weeks    Status  New    Target Date  04/09/19        PT Long Term Goals - 03/19/19 1317      PT LONG TERM GOAL #1   Title  TBD after 4th vsiit and MCD authorization re submitted if necessary           Access Code: [email or web address]  URL:  [email or web address]  Date: 04/04/2019  Prepared by: Voncille Lo   Exercises  Standing Hip Hinge with Dowel - 10 reps - 3 sets - 1x daily - 7x weekly  Prone Press Up - 10 reps - 1 sets - 5 hold - 3x daily - 7x weekly  Prone Hip Extension - 10 reps - 1 sets - 5 hold - 1x daily - 7x weekly  Prone Hip Extension with Bent Knee - 10 reps - 1 sets - 5 hold - 1x daily - 7x weekly  Superman on Table - 10 reps - 1 sets - 5 hold - 1x daily - 7x weekly    Plan - 04/04/19 1157    Clinical Impression Statement  Pt return for 2nd visit to  clinic and has reduced pain to 3/10, no pain on LT but residual on RT. Pt consents to TPDN and has one stick with TPDN on RT QL with marked response.  Pt declines further TPDN.  Pt continues with exericise and manual therapy with resolving of pain after RX.  discussed posture/ body mechanics and added to HEP for extension based exercises.  Will continue with strengtheining next visit    Personal Factors and Comorbidities  Comorbidity 1;Comorbidity 2    Comorbidities  DM  , pancreatitis, menorhagia    Examination-Activity Limitations  Caring for Others;Stand;Sit;Toileting;Transfers    Examination-Participation Restrictions  Cleaning    Stability/Clinical Decision Making  Stable/Uncomplicated    Clinical Decision Making  Low    Rehab Potential  Good    PT Frequency  1x / week    PT Duration  3 weeks    PT Treatment/Interventions  ADLs/Self Care Home Management;Cryotherapy;Electrical Stimulation;Iontophoresis 4mg /ml Dexamethasone;Moist Heat;Traction;Ultrasound;Therapeutic exercise;Therapeutic activities;Functional mobility training;Neuromuscular re-education;Gait training;Patient/family education;Passive range of motion;Manual techniques;Dry needling;Taping;Spinal Manipulations    PT Next Visit Plan  assess TPDN,  review HEP add DL to HEP work on gym equipment and weights next visit    PT Home Exercise Plan  extension based initial HEP    Consulted and  Agree with Plan of Care  Patient       Patient will benefit from skilled therapeutic intervention in order to improve the following deficits and impairments:  Decreased activity tolerance, Decreased mobility, Decreased range of motion, Decreased strength, Increased muscle spasms, Postural dysfunction, Improper body mechanics, Pain  Visit Diagnosis: Acute bilateral low back pain with right-sided sciatica  Muscle weakness (generalized)     Problem List Patient Active Problem List   Diagnosis Date Noted  . Dysmenorrhea 03/28/2019  . History of partial pancreatectomy 03/27/2019  . Back pain 01/29/2019  . Pelvic pain in female 01/16/2019  . Menorrhagia 11/01/2018  . History of splenectomy 05/01/2018  . Fibroid 05/08/2017  . Chronic pancreatitis ([place]) 05/08/2017  . History of postpartum severe pre-eclampsia 04/10/2017  . History of cesarean delivery 04/10/2017  . Diabetes ([place])   . 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 Vitamin D deficiency 12/29/2013  . Asthma 12/25/2013  . Genital warts 12/22/2011  .  Pancreatic cyst 06/24/2010   Voncille Lo, PT Certified Exercise Expert for the Aging Adult  04/04/19 12:03 PM Phone: [phone] Fax: [facility] [address] [place], [place], [address] Phone: [phone]   Fax:  [phone]  Name: EMONEE AVE MRN: [id number] Date of Birth: [birth year]

## 2019-04-04 NOTE — Patient Instructions (Addendum)
Sleeping on Back  Place pillow under knees. A pillow with cervical support and a roll around waist are also helpful. Copyright  VHI. All rights reserved.  Sleeping on Side Place pillow between knees. Use cervical support under neck and a roll around waist as needed. Copyright  VHI. All rights reserved.   Sleeping on Stomach   If this is the only desirable sleeping position, place pillow under lower legs, and under stomach or chest as needed.  Posture - Sitting   Sit upright, head facing forward. Try using a roll to support lower back. Keep shoulders relaxed, and avoid rounded back. Keep hips level with knees. Avoid crossing legs for long periods. Stand to Sit / Sit to Stand   To sit: Bend knees to lower self onto front edge of chair, then scoot back on seat. To stand: Reverse sequence by placing one foot forward, and scoot to front of seat. Use rocking motion to stand up.   Work Height and Reach  Ideal work height is no more than 2 to 4 inches below elbow level when standing, and at elbow level when sitting. Reaching should be limited to arm's length, with elbows slightly bent.  Bending  Bend at hips and knees, not back. Keep feet shoulder-width apart.    Posture - Standing   Good posture is important. Avoid slouching and forward head thrust. Maintain curve in low back and align ears over shoul- ders, hips over ankles.  Alternating Positions   Alternate tasks and change positions frequently to reduce fatigue and muscle tension. Take rest breaks. Computer Work   Position work to face forward. Use proper work and seat height. Keep shoulders back and down, wrists straight, and elbows at right angles. Use chair that provides full back support. Add footrest and lumbar roll as needed.  Getting Into / Out of Car  Lower self onto seat, scoot back, then bring in one leg at a time. Reverse sequence to get out.  Dressing  Lie on back to pull socks or slacks over feet, or sit  and bend leg while keeping back straight.    Housework - Sink  Place one foot on ledge of cabinet under sink when standing at sink for prolonged periods.   Pushing / Pulling  Pushing is preferable to pulling. Keep back in proper alignment, and use leg muscles to do the work.  Deep Squat   Squat and lift with both arms held against upper trunk. Tighten stomach muscles without holding breath. Use smooth movements to avoid jerking.  Avoid Twisting   Avoid twisting or bending back. Pivot around using foot movements, and bend at knees if needed when reaching for articles.  Carrying Luggage   Distribute weight evenly on both sides. Use a cart whenever possible. Do not twist trunk. Move body as a unit.   Lifting Principles .Maintain proper posture and head alignment. .Slide object as close as possible before lifting. .Move obstacles out of the way. .Test before lifting; ask for help if too heavy. .Tighten stomach muscles without holding breath. .Use smooth movements; do not jerk. .Use legs to do the work, and pivot with feet. .Distribute the work load symmetrically and close to the center of trunk. .Push instead of pull whenever possible.   Ask For Help   Ask for help and delegate to others when possible. Coordinate your movements when lifting together, and maintain the low back curve.  Log Roll   Lying on back, bend left knee and place left   arm across chest. Roll all in one movement to the right. Reverse to roll to the left. Always move as one unit. Housework - Sweeping  Use long-handled equipment to avoid stooping.   Housework - Wiping  Position yourself as close as possible to reach work surface. Avoid straining your back.  Laundry - Unloading Wash   To unload small items at bottom of washer, lift leg opposite to arm being used to reach.  [place] close to area to be raked. Use arm movements to do the work. Keep back straight and avoid  twisting.     Cart  When reaching into cart with one arm, lift opposite leg to keep back straight.   Getting Into / Out of Bed  Lower self to lie down on one side by raising legs and lowering head at the same time. Use arms to assist moving without twisting. Bend both knees to roll onto back if desired. To sit up, start from lying on side, and use same move-ments in reverse. Housework - Vacuuming  Hold the vacuum with arm held at side. Step back and forth to move it, keeping head up. Avoid twisting.   Laundry - IT consultant so that bending and twisting can be avoided.   Laundry - Unloading Dryer  Squat down to reach into clothes dryer or use a reacher.  Gardening - Weeding / Probation officer or Kneel. Knee pads may be helpful.                             Trigger Point Dry Needling  . What is Trigger Point Dry Needling (DN)? o DN is a physical therapy technique used to treat muscle pain and dysfunction. Specifically, DN helps deactivate muscle trigger points (muscle knots).  o A thin filiform needle is used to penetrate the skin and stimulate the underlying trigger point. The goal is for a local twitch response (LTR) to occur and for the trigger point to relax. No medication of any kind is injected during the procedure.   . What Does Trigger Point Dry Needling Feel Like?  o The procedure feels different for each individual patient. Some patients report that they do not actually feel the needle enter the skin and overall the process is not painful. Very mild bleeding may occur. However, many patients feel a deep cramping in the muscle in which the needle was inserted. This is the local twitch response.   Marland Kitchen How Will I feel after the treatment? o Soreness is normal, and the onset of soreness may not occur for a few hours. Typically this soreness does not last longer than two days.  o Bruising is uncommon, however; ice can be used to  decrease any possible bruising.  o In rare cases feeling tired or nauseous after the treatment is normal. In addition, your symptoms may get worse before they get better, this period will typically not last longer than 24 hours.   . What Can I do After My Treatment? o Increase your hydration by drinking more water for the next 24 hours. o You may place ice or heat on the areas treated that have become sore, however, do not use heat on inflamed or bruised areas. Heat often brings more relief post needling. o You can continue your regular activities, but vigorous activity is not recommended initially after the treatment for 24 hours. o DN is best combined  with other physical therapy such as strengthening, stretching, and other therapies.  Voncille Lo, PT Certified Exercise Expert for the Aging Adult  04/04/19 11:17 AM Phone: [phone] Fax: [phone]

## 2019-04-05 ENCOUNTER — Ambulatory Visit (HOSPITAL_COMMUNITY): Payer: Medicaid Other

## 2019-04-05 ENCOUNTER — Encounter (INDEPENDENT_AMBULATORY_CARE_PROVIDER_SITE_OTHER): Payer: Medicaid Other | Admitting: Ophthalmology

## 2019-04-08 ENCOUNTER — Encounter (INDEPENDENT_AMBULATORY_CARE_PROVIDER_SITE_OTHER): Payer: Medicaid Other | Admitting: Ophthalmology
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
## 2019-04-11 ENCOUNTER — Other Ambulatory Visit: Payer: Self-pay | Admitting: Physician Assistant

## 2019-04-11 ENCOUNTER — Ambulatory Visit: Payer: Medicaid Other | Attending: Internal Medicine | Admitting: Physical Therapy

## 2019-04-11 ENCOUNTER — Ambulatory Visit (HOSPITAL_COMMUNITY)
Admission: RE | Admit: 2019-04-11 | Discharge: 2019-04-11 | Disposition: A | Discharge status: Home / Self Care | Payer: Medicaid Other | Source: Ambulatory Visit | Attending: Physician Assistant | Admitting: Physician Assistant

## 2019-04-11 ENCOUNTER — Other Ambulatory Visit: Payer: Self-pay

## 2019-04-11 DIAGNOSIS — D490 Neoplasm of unspecified behavior of digestive system: Secondary | ICD-10-CM

## 2019-04-11 DIAGNOSIS — M5441 Lumbago with sciatica, right side: Secondary | ICD-10-CM | POA: Insufficient documentation

## 2019-04-11 DIAGNOSIS — K859 Acute pancreatitis without necrosis or infection, unspecified: Secondary | ICD-10-CM

## 2019-04-11 DIAGNOSIS — R1013 Epigastric pain: Secondary | ICD-10-CM

## 2019-04-11 DIAGNOSIS — R935 Abnormal findings on diagnostic imaging of other abdominal regions, including retroperitoneum: Secondary | ICD-10-CM | POA: Diagnosis not present

## 2019-04-11 DIAGNOSIS — M6281 Muscle weakness (generalized): Secondary | ICD-10-CM | POA: Diagnosis not present

## 2019-04-11 DIAGNOSIS — R932 Abnormal findings on diagnostic imaging of liver and biliary tract: Secondary | ICD-10-CM | POA: Diagnosis not present

## 2019-04-11 MED ORDER — GADOBUTROL 1 MMOL/ML IV SOLN
7.0000 mL | Freq: Once | INTRAVENOUS | Status: AC | PRN
  Administered 2019-04-11: 08:00:00 7 mL via INTRAVENOUS

## 2019-04-11 NOTE — Therapy (Signed)
[place], [place], [address] Phone: [phone]   Fax:  [phone]  Physical Therapy Treatment  Patient Details  Name: [name] MRN: [id number] Date of Birth: February 03, 1986 Referring Provider (PT): Karen Kays MD   Encounter Date: 04/11/2019  PT End of Session - 04/11/19 1128    Visit Number  3    Number of Visits  4    Date for PT Re-Evaluation  04/09/19    Authorization Time Period  Approved 3 PT visist 04/03/19 to 04/23/19    Authorization - Visit Number  2    Authorization - Number of Visits  3    PT Start Time  1100    PT Stop Time  [id number]    PT Time Calculation (min)  53 min       Past Medical History:  Diagnosis Date  . Alcohol abuse   . Anemia   . Asthma    rarely uses inhaler  . Bipolar disorder ([place])    HX PPD after 2014 delivery, no meds  . Bronchitis   . Diabetes mellitus without complication ([place])    partial pancreatectomy 2012-glyburide  . Fibroid   . Hypertension   . Hypertensive retinopathy    OU  . Missed ab    x 2, one requiring D & E  . Pancreatic cyst   . Pancreatitis    diet controlled  . Pelvic inflammatory disease   . Plantar fasciitis   . Polycystic ovary syndrome   . 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 Renal cyst   . Sickle cell trait [facility])     Past Surgical History:  Procedure Laterality Date  . CESAREAN SECTION N/A 05/23/2012   Procedure: CESAREAN SECTION;  Surgeon: Lahoma Crocker, MD;  Location: [facility];  Service: Obstetrics;  Laterality: N/A;  primary  . CESAREAN SECTION N/A 04/18/2014   Procedure: REPEAT CESAREAN SECTION;  Surgeon: Shelly Bombard, MD;  Location: [facility];  Service: Obstetrics;  Laterality: N/A;  . CESAREAN SECTION N/A 11/05/2017   Procedure: REPEAT CESAREAN SECTION;  Surgeon: Woodroe Mode, MD;  Location: [facility];  Service: Obstetrics;  Laterality: N/A;  . DILATION AND CURETTAGE OF UTERUS      for MAB  . EUS  N/A 06/11/2015   Procedure: UPPER ENDOSCOPIC ULTRASOUND (EUS) RADIAL;  Surgeon: Milus Banister, MD;  [facility];  Service: Endoscopy;  Laterality: N/A;  . PANCREAS SURGERY  2012   s/p partial pancreatectomy  . SPLENECTOMY, TOTAL  2012  . WISDOM TOOTH EXTRACTION      There were no vitals filed for this visit.  Subjective Assessment - 04/11/19 1118    Subjective  No pain just tight and tender.    Currently in Pain?  No/denies    Aggravating Factors   laundry, play with sons    Pain Relieving Factors  not lifting                       [facility] Adult PT Treatment/Exercise - 04/11/19 0001      Lumbar Exercises: Stretches   Standing Extension  5 reps;20 seconds    Press Ups  5 reps    Press Ups Limitations  x 2    Quadruped Mid Back Stretch Limitations  childs pose / cat/camel after extension exercises       Lumbar Exercises: Machines for Strengthening   Leg Press  2 plates bilat x 20  Other Lumbar Machine Exercise  Lat pull 20# , seated row x 25# x 15 each cues for alignment       Lumbar Exercises: Standing   Other Standing Lumbar Exercises  hip hinge with dowel  2 x 10    added 5# dumbells      Lumbar Exercises: Prone   Other Prone Lumbar Exercises  25 x RT/LT hip extension each    Other Prone Lumbar Exercises  15x RT/LT hip extension with knee flexion each,   superman 10 x in prone      Moist Heat Therapy   Number Minutes Moist Heat  15 Minutes    Moist Heat Location  Lumbar Spine   hooklying              PT Short Term Goals - 03/19/19 1139      PT SHORT TERM GOAL #1   Title  Pt will be independent with initial HEP    Baseline  no knowledge    Time  3    Period  Weeks    Status  New    Target Date  04/09/19      PT SHORT TERM GOAL #2   Title  Pt will be able to bend forward and touch ankles with pain 3/10 or less    Baseline  currently 5/10 and unable to touch lower than mid shin/ 13 inches finger tip to floor    Time  3     Period  Weeks    Status  New    Target Date  04/09/19      PT SHORT TERM GOAL #3   Title  Pt will be able to carry laundry and and lean over top loader with pain 3/10 or less    Baseline  pt now 5/10 and unable  to do laundry    Time  3    Period  Weeks    Status  New    Target Date  04/09/19        PT Long Term Goals - 03/19/19 1317      PT LONG TERM GOAL #1   Title  TBD after 4th vsiit and MCD authorization re submitted if necessary            Plan - 04/11/19 1130    Clinical Impression Statement  Pt reports she is able to do laundry with average of 3/10 pain, sometimes, 5/10. She is able to carry her 15 month old daughter now without increased pain. Progressing toward LTGs Needs cues for correct HEP technique.    PT Next Visit Plan  assess TPDN,  review HEP add DL to HEP work on gym equipment and weights next visit    PT Home Exercise Plan  extension based initial HEP       Patient will benefit from skilled therapeutic intervention in order to improve the following deficits and impairments:  Decreased activity tolerance, Decreased mobility, Decreased range of motion, Decreased strength, Increased muscle spasms, Postural dysfunction, Improper body mechanics, Pain  Visit Diagnosis: Acute bilateral low back pain with right-sided sciatica  Muscle weakness (generalized)     Problem List Patient Active Problem List   Diagnosis Date Noted  . Dysmenorrhea 03/28/2019  . History of partial pancreatectomy 03/27/2019  . Back pain 01/29/2019  . Pelvic pain in female 01/16/2019  . Menorrhagia 11/01/2018  . History of splenectomy 05/01/2018  . Fibroid 05/08/2017  . Chronic pancreatitis ([place]) 05/08/2017  . History of postpartum severe  pre-eclampsia 04/10/2017  . History of cesarean delivery 04/10/2017  . Diabetes ([place])   . 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 Vitamin D deficiency 12/29/2013  . Asthma 12/25/2013  . Genital warts 12/22/2011  . Pancreatic cyst  06/24/2010    Dorene Ar, PTA 04/11/2019, 11:55 AM  [facility] [address] [place], [place], [address] Phone: [phone]   Fax:  [phone]  Name: [name] MRN: [id number] Date of Birth: [birth year]

## 2019-04-15 ENCOUNTER — Ambulatory Visit (INDEPENDENT_AMBULATORY_CARE_PROVIDER_SITE_OTHER): Payer: Medicaid Other | Admitting: Ophthalmology

## 2019-04-15 ENCOUNTER — Encounter (INDEPENDENT_AMBULATORY_CARE_PROVIDER_SITE_OTHER): Payer: Self-pay | Admitting: Ophthalmology

## 2019-04-15 DIAGNOSIS — I10 Essential (primary) hypertension: Secondary | ICD-10-CM

## 2019-04-15 DIAGNOSIS — H33322 Round hole, left eye: Secondary | ICD-10-CM

## 2019-04-15 DIAGNOSIS — H3581 Retinal edema: Secondary | ICD-10-CM | POA: Diagnosis not present

## 2019-04-15 DIAGNOSIS — H35033 Hypertensive retinopathy, bilateral: Secondary | ICD-10-CM

## 2019-04-15 DIAGNOSIS — E119 Type 2 diabetes mellitus without complications: Secondary | ICD-10-CM

## 2019-04-15 LAB — HM DIABETES EYE EXAM

## 2019-04-15 NOTE — BH Specialist Note (Signed)
Pt did not arrive to video visit and did not answer the phone ; Left HIPPA-compliant message to call back Roselyn Reef from [facility] at [phone].  ; left MyChart message for patient.   [name] via Telemedicine Video Visit  04/15/2019 [name] [id number]  [name]

## 2019-04-16 ENCOUNTER — Encounter: Payer: Self-pay | Admitting: Physical Therapy

## 2019-04-16 ENCOUNTER — Other Ambulatory Visit: Payer: Self-pay

## 2019-04-16 ENCOUNTER — Ambulatory Visit: Payer: Medicaid Other | Admitting: Physical Therapy

## 2019-04-16 DIAGNOSIS — M6281 Muscle weakness (generalized): Secondary | ICD-10-CM

## 2019-04-16 DIAGNOSIS — M5441 Lumbago with sciatica, right side: Secondary | ICD-10-CM

## 2019-04-16 NOTE — Patient Instructions (Signed)
     for the deadlifts you can do 5 reps and rest and complete 4 sets Voncille Lo, PT Certified Exercise Expert for the Aging Adult  04/16/19 11:29 AM Phone: [phone] Fax: [phone]

## 2019-04-16 NOTE — Therapy (Signed)
[place], [place], [address] Phone: [phone]   Fax:  [phone]  Physical Therapy Treatment/ERO  Patient Details  Name: [name] MRN: [id number] Date of Birth: 31-Jul-1985 Referring Provider (PT): Karen Kays MD   Encounter Date: 04/16/2019  PT End of Session - 04/16/19 1130    Visit Number  4    Number of Visits  10    Date for PT Re-Evaluation  05/28/19    Authorization Type  MCD  2nd authorization submitted 04-16-2019    PT Start Time  1100    PT Stop Time  1158    PT Time Calculation (min)  58 min    Activity Tolerance  Patient tolerated treatment well    Behavior During Therapy  [facility] for tasks assessed/performed       Past Medical History:  Diagnosis Date  . Alcohol abuse   . Anemia   . Asthma    rarely uses inhaler  . Bipolar disorder ([place])    HX PPD after 2014 delivery, no meds  . Bronchitis   . 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 Fibroid   . Hypertension   . Hypertensive retinopathy    OU  . Missed ab    x 2, one requiring D & E  . Pancreatic cyst   . Pancreatitis    diet controlled  . Pelvic inflammatory disease   . Plantar fasciitis   . Polycystic ovary syndrome   . 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 Renal cyst   . Sickle cell trait [facility])     Past Surgical History:  Procedure Laterality Date  . CESAREAN SECTION N/A 05/23/2012   Procedure: CESAREAN SECTION;  Surgeon: Lahoma Crocker, MD;  Location: [facility];  Service: Obstetrics;  Laterality: N/A;  primary  . CESAREAN SECTION N/A 04/18/2014   Procedure: REPEAT CESAREAN SECTION;  Surgeon: Shelly Bombard, MD;  Location: [facility];  Service: Obstetrics;  Laterality: N/A;  . CESAREAN SECTION N/A 11/05/2017   Procedure: REPEAT CESAREAN SECTION;  Surgeon: Woodroe Mode, MD;  Location: [place];  Service: Obstetrics;  Laterality: N/A;  . DILATION AND  CURETTAGE OF UTERUS      for MAB  . EUS N/A 06/11/2015   Procedure: UPPER ENDOSCOPIC ULTRASOUND (EUS) RADIAL;  Surgeon: Milus Banister, MD;  Location: [facility];  Service: Endoscopy;  Laterality: N/A;  . PANCREAS SURGERY  2012   s/p partial pancreatectomy  . SPLENECTOMY, TOTAL  2012  . WISDOM TOOTH EXTRACTION      There were no vitals filed for this visit.  Subjective Assessment - 04/16/19 1111    Subjective  No pain but really tender like a 2/10. when I do heaby household chores or pick up my baby my pain is6/10.  It increases if I try to play with my boys or carry my 17 months and about 25 #  I am trying to be able to carry my baby up stairs    Pertinent History  DM, menorhagia    Limitations  Sitting;Standing;Walking    How long can you sit comfortably?  25 to 30 minutes and see entire show    How long can you stand comfortably?  30 minutes    How long can you walk comfortably?  30 minutes    Diagnostic tests  MRI1. At L5-S1 there is a broad central disc protrusion.    Patient Stated Goals  to  learn HEP and ways I can prevent causing pain    Currently in Pain?  Yes    Pain Score  2    heavy chores 6/10   Pain Location  Back    Pain Orientation  Mid;Lower    Pain Descriptors / Indicators  Aching    Pain Type  Acute pain    Pain Onset  More than a month ago    Pain Frequency  Intermittent         [facility] PT Assessment - 04/16/19 0001      Assessment   Medical Diagnosis  Acute midline Low back time with scitica RT>LT    Referring Provider (PT)  nischal, Narindra MD    Onset Date/Surgical Date  12/26/18      AROM   Overall AROM   Deficits    Lumbar Flexion  75 % available range   2-3  inches finger tip to floor   Lumbar Extension  limited 50%    Lumbar - Right Side Bend  100% available range   fingers past jt line   Lumbar - Left Side Bend  80% available range   finger to joint line , slight twinge   Lumbar - Right Rotation  100%    Lumbar - Left Rotation  80%       Strength   Overall Strength  Deficits    Right Hip Flexion  4/5   pain   Right Hip Extension  4/5   pain on right back   Right Hip ABduction  4/5   pain on right back   Left Hip Flexion  4+/5    Left Hip Extension  4+/5    Left Hip ABduction  4+/5    Right Knee Flexion  4+/5    Right Knee Extension  4/5   pain in RT LB   Left Knee Flexion  5/5    Left Knee Extension  5/5    Right Ankle Dorsiflexion  5/5    Left Ankle Dorsiflexion  5/5                   [facility] Adult PT Treatment/Exercise - 04/16/19 0001      Self-Care   Self-Care  Lifting    Lifting  how to lift child  with proper body mechanics       Lumbar Exercises: Standing   Other Standing Lumbar Exercises  goblet squat with 15 # KB 10 x 2 setsfr  om seat for box squat    Other Standing Lumbar Exercises  hip hinge into wall 30 x , deadlift with 30# 5 x and 4 sets,  monster walks with green t band 5 x 10 feet      Moist Heat Therapy   Number Minutes Moist Heat  15 Minutes    Moist Heat Location  Lumbar Spine   hooklying     Manual Therapy   Manual Therapy  Soft tissue mobilization;Myofascial release    Manual therapy comments  skilled palpation with TPDN    Soft tissue mobilization  RT QL RT gluteals    Myofascial Release  RT QL       Trigger Point Dry Needling - 04/16/19 0001    Consent Given?  Yes    Education Handout Provided  Previously provided    Muscles Treated Back/Hip  Quadratus lumborum   RT   Dry Needling Comments  .30  100 mm    Other Dry Needling  two sticks with  pistoning and turning    Quadratus Lumborum Response  Twitch response elicited;Palpable increased muscle length           PT Education - 04/16/19 1130    Education Details  added to HEP with monster walks and deadlifts.    Person(s) Educated  Patient    Methods  Explanation;Demonstration;Tactile cues;Verbal cues;Handout    Comprehension  Verbalized understanding;Returned demonstration       PT Short Term Goals -  04/16/19 1104      PT SHORT TERM GOAL #1   Title  Pt will be independent with initial HEP    Baseline  I am able to do my initial HEP    Time  3    Period  Weeks    Status  Achieved    Target Date  04/09/19      PT SHORT TERM GOAL #2   Title  Pt will be able to bend forward and touch ankles with pain 3/10 or less    Baseline  Pt able to bend forward and with fingers 2 inch LT to 2.5  inch from Floor  No pain    Time  3    Period  Weeks    Status  Achieved    Target Date  04/09/19      PT SHORT TERM GOAL #3   Title  Pt will be able to carry laundry and and lean over top loader with pain 3/10 or less    Baseline  pt now 2/10 to complete laundry duties,  trying to vacuum 6/10    Time  3    Period  Weeks    Status  On-going    Target Date  04/09/19        PT Long Term Goals - 04/16/19 1107      PT LONG TERM GOAL #1   Title  Demonstrate and verbalize techniques to reduce the risk of re-injury including: lifting, posture, body mechanics    Baseline  limited knowlege    Time  6    Period  Weeks    Status  New    Target Date  05/28/19      PT LONG TERM GOAL #2   Title  Pt will be able to carry 25 # child up stairs without exacerbating pain in back    Baseline  unable to carry daughter up stairs    Time  6    Period  Weeks    Status  New    Target Date  05/28/19      PT LONG TERM GOAL #3   Title  Pt will be able to run and play soccer with 53 yo and 35 yo sons in back yard without pain greater than 3/10    Baseline  Pt unable to play with kids in backyard presently.  Pt    Time  6    Period  Weeks    Status  New    Target Date  05/28/19      PT LONG TERM GOAL #4   Title  Pt will be able to get up and down from floor without exacerbating pain in low back to play games and pick up toys from floor    Time  6    Period  Weeks    Status  New    Target Date  05/28/19      PT LONG TERM GOAL #5   Title  Pt will be able to clean bathtub  and vaccuum house with pain no  greater than 3/10    Baseline  Pt unable to perform heavy household duties as a young mom    Time  6    Period  Weeks    Status  New    Target Date  05/28/19            Plan - 04/16/19 1325    Clinical Impression Statement  Ms Scalera has achieved 2 STG and is making progress toward STG #3 but still has pain with lifting 25# child and cannot carry 63 month old up stairs.  She would like to be able to return to heavy duty chores and be able to play soccer in back yard with 5 and 35 yo.  she still have some difficulty getting down to the ground to play with children and pick up toys. Pt would benefit from skilled PT for and additional 6 visits in the next 6 weeks to strengthen and alleviate pain    Personal Factors and Comorbidities  Comorbidity 1;Comorbidity 2    Comorbidities  DM  , pancreatitis, menorhagia    Examination-Activity Limitations  Caring for Others;Stand;Sit;Toileting;Transfers    Examination-Participation Restrictions  Cleaning    Stability/Clinical Decision Making  Stable/Uncomplicated    Clinical Decision Making  Low    Rehab Potential  Good    PT Frequency  1x / week    PT Duration  3 weeks    PT Treatment/Interventions  ADLs/Self Care Home Management;Cryotherapy;Electrical Stimulation;Iontophoresis 4mg /ml Dexamethasone;Moist Heat;Traction;Ultrasound;Therapeutic exercise;Therapeutic activities;Functional mobility training;Neuromuscular re-education;Gait training;Patient/family education;Passive range of motion;Manual techniques;Dry needling;Taping;Spinal Manipulations    PT Next Visit Plan  review HEP and add weights and utilize going up and down steps.  modified burpeess to prepare for floor transfers    PT Home Exercise Plan  extension based initial HEP    Consulted and Agree with Plan of Care  Patient       Patient will benefit from skilled therapeutic intervention in order to improve the following deficits and impairments:  Decreased activity tolerance, Decreased  mobility, Decreased range of motion, Decreased strength, Increased muscle spasms, Postural dysfunction, Improper body mechanics, Pain  Visit Diagnosis: Acute bilateral low back pain with right-sided sciatica  Muscle weakness (generalized)     Problem List Patient Active Problem List   Diagnosis Date Noted  . Dysmenorrhea 03/28/2019  . History of partial pancreatectomy 03/27/2019  . Back pain 01/29/2019  . Pelvic pain in female 01/16/2019  . Menorrhagia 11/01/2018  . History of splenectomy 05/01/2018  . Fibroid 05/08/2017  . Chronic pancreatitis ([place]) 05/08/2017  . History of postpartum severe pre-eclampsia 04/10/2017  . History of cesarean delivery 04/10/2017  . Diabetes ([place])   . 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 Vitamin D deficiency 12/29/2013  . Asthma 12/25/2013  . Genital warts 12/22/2011  . Pancreatic cyst 06/24/2010    Voncille Lo, PT Certified Exercise Expert for the Aging Adult  04/16/19 2:51 PM Phone: [phone] Fax: [facility] [address] [place], [place], [address] Phone: [phone]   Fax:  [phone]  Name: [name] MRN: [id number] Date of Birth: Apr 19, 1985

## 2019-04-17 ENCOUNTER — Ambulatory Visit: Payer: Medicaid Other | Admitting: Clinical

## 2019-04-17 DIAGNOSIS — Z91199 Patient's noncompliance with other medical treatment and regimen due to unspecified reason: Secondary | ICD-10-CM

## 2019-04-17 DIAGNOSIS — Z5329 Procedure and treatment not carried out because of patient's decision for other reasons: Secondary | ICD-10-CM

## 2019-04-23 NOTE — Telephone Encounter (Signed)
Spoke to patient by phone to review MRI/MRCP She will have EUS with Dr. Ardis Hughs or Dr. Rush Landmark to evaluate potential abnormality in the common bile duct.  The pantoprazole and pancreatic enzyme started by Nicoletta Ba, PA-C seem to be helping her pain. I recommended that she continue the pantoprazole 40 mg daily Continue Creon but I asked that she increase to 3 capsules with any high-fat or large meals otherwise 2 capsules with meals and 1 with snacks

## 2019-04-24 ENCOUNTER — Other Ambulatory Visit: Payer: Self-pay

## 2019-04-24 DIAGNOSIS — R932 Abnormal findings on diagnostic imaging of liver and biliary tract: Secondary | ICD-10-CM

## 2019-04-24 DIAGNOSIS — K859 Acute pancreatitis without necrosis or infection, unspecified: Secondary | ICD-10-CM

## 2019-04-24 DIAGNOSIS — R933 Abnormal findings on diagnostic imaging of other parts of digestive tract: Secondary | ICD-10-CM

## 2019-04-24 NOTE — Progress Notes (Signed)
EUS scheduled for 3/11 with Dr Ardis Hughs, pt instructed and medications reviewed.  Patient instructions mailed to home.  Patient to call with any questions or concerns. Covid test also instructed

## 2019-04-30 ENCOUNTER — Other Ambulatory Visit: Payer: Self-pay

## 2019-04-30 ENCOUNTER — Encounter: Payer: Self-pay | Admitting: Physical Therapy

## 2019-04-30 ENCOUNTER — Ambulatory Visit: Payer: Medicaid Other | Admitting: Physical Therapy

## 2019-04-30 DIAGNOSIS — M6281 Muscle weakness (generalized): Secondary | ICD-10-CM

## 2019-04-30 DIAGNOSIS — M5441 Lumbago with sciatica, right side: Secondary | ICD-10-CM

## 2019-04-30 NOTE — Patient Instructions (Signed)
       Voncille Lo, PT Certified Exercise Expert for the Aging Adult  04/30/19 11:46 AM Phone: [phone] Fax: [phone]
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
## 2019-05-02 ENCOUNTER — Ambulatory Visit: Payer: Medicaid Other | Admitting: Physical Therapy

## 2019-05-07 ENCOUNTER — Ambulatory Visit: Payer: Medicaid Other | Attending: Internal Medicine | Admitting: Physical Therapy

## 2019-05-07 ENCOUNTER — Encounter: Payer: Self-pay | Admitting: Physical Therapy

## 2019-05-07 ENCOUNTER — Other Ambulatory Visit: Payer: Self-pay

## 2019-05-07 DIAGNOSIS — M6281 Muscle weakness (generalized): Secondary | ICD-10-CM | POA: Insufficient documentation

## 2019-05-07 DIAGNOSIS — M5441 Lumbago with sciatica, right side: Secondary | ICD-10-CM | POA: Diagnosis not present

## 2019-05-07 NOTE — Patient Instructions (Signed)
   Side plank hold 30 to 45 seconds Do each 3-5 x           Voncille Lo, PT Certified Exercise Expert for the Aging Adult  05/07/19 11:39 AM Phone: [phone] Fax: [phone]

## 2019-05-07 NOTE — Therapy (Signed)
[place], [place], [address] Phone: [phone]   Fax:  [phone]  Physical Therapy Treatment/Discharge Note  Patient Details  Name: [name] MRN: [id number] Date of Birth: 1985-04-12 Referring Provider (PT): Karen Kays MD   Encounter Date: 05/07/2019  PT End of Session - 05/07/19 1104    Visit Number  6    Number of Visits  10    Date for PT Re-Evaluation  05/28/19    Authorization Type  MCD  2nd authorization 3 PT visits 2/18-21 to 3_10-21    Authorization - Visit Number  3    Authorization - Number of Visits  3    PT Start Time  1103    PT Stop Time  1145    PT Time Calculation (min)  42 min    Activity Tolerance  Patient tolerated treatment well    Behavior During Therapy  [facility] for tasks assessed/performed       Past Medical History:  Diagnosis Date  . Alcohol abuse   . Anemia   . Asthma    rarely uses inhaler  . Bipolar disorder ([place])    HX PPD after 2014 delivery, no meds  . Bronchitis   . 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 Fibroid   . Hypertension   . Hypertensive retinopathy    OU  . Missed ab    x 2, one requiring D & E  . Pancreatic cyst   . Pancreatitis    diet controlled  . Pelvic inflammatory disease   . Plantar fasciitis   . Polycystic ovary syndrome   . 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 Renal cyst   . Sickle cell trait [facility])     Past Surgical History:  Procedure Laterality Date  . CESAREAN SECTION N/A 05/23/2012   Procedure: CESAREAN SECTION;  Surgeon: Lahoma Crocker, MD;  Location: [facility];  Service: Obstetrics;  Laterality: N/A;  primary  . CESAREAN SECTION N/A 04/18/2014   Procedure: REPEAT CESAREAN SECTION;  Surgeon: Shelly Bombard, MD;  Location: [facility];  Service: Obstetrics;  Laterality: N/A;  . CESAREAN SECTION N/A 11/05/2017   Procedure: REPEAT CESAREAN SECTION;  Surgeon: Woodroe Mode, MD;  Location: [place];  Service: Obstetrics;  Laterality: N/A;  . DILATION AND CURETTAGE OF UTERUS      for MAB  . EUS N/A 06/11/2015   Procedure: UPPER ENDOSCOPIC ULTRASOUND (EUS) RADIAL;  Surgeon: Milus Banister, MD;  Location: [facility];  Service: Endoscopy;  Laterality: N/A;  . PANCREAS SURGERY  2012   s/p partial pancreatectomy  . SPLENECTOMY, TOTAL  2012  . WISDOM TOOTH EXTRACTION      There were no vitals filed for this visit.  Subjective Assessment - 05/07/19 1108    Subjective  I was able to play with my kids soccer in the yard. I have no back pain at all    Pertinent History  DM, menorhagia    Limitations  Sitting;Standing;Walking    How long can you sit comfortably?  I can watch a movie  about 2 and 1/2 hours    How long can you stand comfortably?  as long as I want 1 hour    How long can you walk comfortably?  I can walk 2 miles    Diagnostic tests  MRI1. At L5-S1 there is a broad central disc protrusion.    Patient Stated Goals  to learn  HEP and ways I can prevent causing pain    Currently in Pain?  No/denies    Pain Score  0-No pain    Pain Location  Back         [facility] PT Assessment - 05/07/19 0001      Assessment   Medical Diagnosis  Acute midline Low back time with scitica RT>LT    Referring Provider (PT)  nischal, Narindra MD    Onset Date/Surgical Date  12/26/18      AROM   Overall AROM   Deficits    Lumbar Flexion  90% available range   able to touch shoes   Lumbar Extension  not limited    Lumbar - Right Side Bend  not limited    Lumbar - Left Side Bend  not limited    Lumbar - Right Rotation  100%    Lumbar - Left Rotation  100%      Strength   Overall Strength  Deficits    Right Hip Flexion  5/5    Right Hip Extension  5/5    Right Hip ABduction  5/5    Left Hip Flexion  5/5    Left Hip Extension  5/5    Left Hip ABduction  5/5    Right Knee Flexion  5/5    Right Knee Extension  5/5    Left Knee Flexion  5/5    Left  Knee Extension  5/5    Right Ankle Dorsiflexion  5/5    Left Ankle Dorsiflexion  5/5                   [facility] Adult PT Treatment/Exercise - 05/07/19 0001      Self-Care   Other Self-Care Comments   Pt educated on community wellness      Therapeutic Activites    Therapeutic Activities  Other Therapeutic Activities    Other Therapeutic Activities  Pt able to lift 25# to 40 # up and down upstairs with no difficulty   worked on Scientist, forensic     Lumbar Exercises: Supine   Single Leg Bridge  10 reps    Bridge with [employer] Limitations  x2 with VC and TC    Other Supine Lumbar Exercises  dead bug x 10 opp/leg,  utilizing opp/leg arm for isometric hold while moving opp/leg and arm     Other Supine Lumbar Exercises  turkish get up x 2 with 10 # weight      Lumbar Exercises: Quadruped   Madcat/Old Horse Limitations  cat camel to childs pose x 10     Opposite Arm/Leg Raise  10 reps;3 seconds;Left arm/Right leg;Right arm/Left leg    Opposite Arm/Leg Raise Limitations  x2 VC and TC with proper form    Other Quadruped Lumbar Exercises  tall plank x 15 with shoulder taps     Other Quadruped Lumbar Exercises  sidelying plank x 10 RT and LT             PT Education - 05/07/19 1139    Education Details  Gave advanced HEP to pt for DC to home. and she returned demo in clinic    Person(s) Educated  Patient    Methods  Explanation;Demonstration;Tactile cues;Verbal cues;Handout    Comprehension  Verbalized understanding;Returned demonstration       PT Short Term Goals - 05/07/19 1112      PT SHORT TERM GOAL #1   Title  Pt will be independent with  initial HEP    Baseline  I am able to do my initial HEP    Time  3    Status  Achieved    Target Date  04/09/19      PT SHORT TERM GOAL #2   Title  Pt will be able to bend forward and touch ankles with pain 3/10 or less    Baseline  able to touch toes now    Time  3    Period  Weeks    Status  Achieved      PT SHORT  TERM GOAL #3   Title  Pt will be able to carry laundry and and lean over top loader with pain 3/10 or less    Baseline  able to do laundry and no back pain    Time  3    Period  Weeks    Status  Achieved        PT Long Term Goals - 05/07/19 1114      PT LONG TERM GOAL #1   Title  Demonstrate and verbalize techniques to reduce the risk of re-injury including: lifting, posture, body mechanics    Baseline  Pt demonstrates good posture simulating bath tub    Time  6    Period  Weeks    Status  Achieved      PT LONG TERM GOAL #2   Title  Pt will be able to carry 25 # child up stairs without exacerbating pain in back    Baseline  Pt demos carrying 28# up and down steps in clinic with not exacerbation of pain    Time  6    Period  Weeks    Status  Achieved      PT LONG TERM GOAL #3   Title  Pt will be able to run and play soccer with 64 yo and 68 yo sons in back yard without pain greater than 3/10    Baseline  Mom able to play with kids in backyard soccer this past weekend    Time  6    Period  Weeks    Status  Achieved      PT LONG TERM GOAL #4   Title  Pt will be able to get up and down from floor without exacerbating pain in low back to play games and pick up toys from floor    Baseline  Pt demo good technique stand to floor and back up with no problems    Time  6    Period  Weeks    Status  Achieved      PT LONG TERM GOAL #5   Title  Pt will be able to clean bathtub and vaccuum house with pain no greater than 3/10    Baseline  Pt demo cleaning bath tub in simulated tub in clinic    Time  6    Period  Weeks    Status  Achieved            Plan - 05/07/19 1344    Clinical Impression Statement  Pt returns to clinici iwth 0/10 pain and reports she has been playing soccer with kids in backyard.  She demonstrates good technique with deadlift and can lift 25 to 40 # and carry up and down steps without exacerbating pain.  Pt has achieved all LTG's and has been given advanced  HEP for advanced strengthening to take home. Discussed community wellness opportunities with her to continue strengtheing at  home. Pt is ready for DC and has 0/10 pain in back. [name] is well motivated to continue strengthening at home.    Personal Factors and Comorbidities  Comorbidity 1;Comorbidity 2    Comorbidities  DM  , pancreatitis, menorhagia    Examination-Activity Limitations  Caring for Others;Stand;Sit;Toileting;Transfers    Examination-Participation Restrictions  Cleaning    Stability/Clinical Decision Making  Stable/Uncomplicated    Clinical Decision Making  Low    Rehab Potential  Good    PT Duration  3 weeks    PT Treatment/Interventions  ADLs/Self Care Home Management;Cryotherapy;Electrical Stimulation;Iontophoresis 16m/ml Dexamethasone;Moist Heat;Traction;Ultrasound;Therapeutic exercise;Therapeutic activities;Functional mobility training;Neuromuscular re-education;Gait training;Patient/family education;Passive range of motion;Manual techniques;Dry needling;Taping;Spinal Manipulations    PT Next Visit Plan  DC    PT Home Exercise Plan  extension based initial HEP  [id number]   Consulted and Agree with Plan of Care  Patient       Patient will benefit from skilled therapeutic intervention in order to improve the following deficits and impairments:  Decreased activity tolerance, Decreased mobility, Decreased range of motion, Decreased strength, Increased muscle spasms, Postural dysfunction, Improper body mechanics, Pain  Visit Diagnosis: Acute bilateral low back pain with right-sided sciatica  Muscle weakness (generalized)     Problem List Patient Active Problem List   Diagnosis Date Noted  . Dysmenorrhea 03/28/2019  . History of partial pancreatectomy 03/27/2019  . Back pain 01/29/2019  . Pelvic pain in female 01/16/2019  . Menorrhagia 11/01/2018  . History of splenectomy 05/01/2018  . Fibroid 05/08/2017  . Chronic pancreatitis ([place] 05/08/2017  . History of  postpartum severe pre-eclampsia 04/10/2017  . History of cesarean delivery 04/10/2017  . Diabetes ([place]   . Hemoglobin [place] trait ([place] 12/29/2013  . Vitamin D deficiency 12/29/2013  . Asthma 12/25/2013  . Genital warts 12/22/2011  . Pancreatic cyst 06/24/2010    LVoncille Lo PT Certified Exercise Expert for the Aging Adult  05/07/19 1:49 PM Phone: [phone]: 3[place] [facility]CThe [facility]GPine Valley [place] [address]Phone: [phone]  Fax:  [phone]  DBakerhillName: Jennifer SZCZYGIELMRN: [id number] of Birth: 209-01-1986  PHYSICAL THERAPY DISCHARGE SUMMARY  Visits from Start of Care: 6  Current functional level related to goals / functional outcomes: As above   Remaining deficits: none   Education / Equipment: HEP Plan: Patient agrees to discharge.  Patient goals were met. Patient is being discharged due to meeting the stated rehab goals.  ?????    And being pleased with current level of function  LVoncille Lo PT Certified Exercise Expert for the Aging Adult  05/07/19 1:49 PM Phone: [phone]: [phone]

## 2019-05-13 ENCOUNTER — Other Ambulatory Visit (HOSPITAL_COMMUNITY)
Admission: RE | Admit: 2019-05-13 | Discharge: 2019-05-13 | Disposition: A | Discharge status: Home / Self Care | Payer: Medicaid Other | Source: Ambulatory Visit | Attending: Gastroenterology | Admitting: Gastroenterology

## 2019-05-13 DIAGNOSIS — Z20822 Contact with and (suspected) exposure to covid-19: Secondary | ICD-10-CM | POA: Insufficient documentation

## 2019-05-13 DIAGNOSIS — Z01812 Encounter for preprocedural laboratory examination: Secondary | ICD-10-CM | POA: Diagnosis not present

## 2019-05-13 LAB — SARS CORONAVIRUS 2 (TAT 6-24 HRS): SARS Coronavirus 2: NEGATIVE

## 2019-05-14 ENCOUNTER — Encounter: Payer: Medicaid Other | Admitting: Physical Therapy

## 2019-05-15 NOTE — Anesthesia Preprocedure Evaluation (Addendum)
Anesthesia Evaluation  Patient identified by MRN, date of birth, ID band Patient awake    Reviewed: Allergy & Precautions, NPO status , Patient's Chart, lab work & pertinent test results  History of Anesthesia Complications (+) DIFFICULT AIRWAY  Airway Mallampati: I  TM Distance: >3 FB Neck ROM: Full    Dental no notable dental hx. (+) Teeth Intact, Dental Advisory Given   Pulmonary asthma , Current Smoker,    Pulmonary exam normal breath sounds clear to auscultation       Cardiovascular hypertension, Normal cardiovascular exam Rhythm:Regular Rate:Normal     Neuro/Psych PSYCHIATRIC DISORDERS Depression Bipolar Disorder negative neurological ROS     GI/Hepatic GERD  Medicated,(+)     substance abuse  alcohol use,   Endo/Other  negative endocrine ROSdiabetes, Oral Hypoglycemic Agents  Renal/GU negative Renal ROS  negative genitourinary   Musculoskeletal negative musculoskeletal ROS (+)   Abdominal   Peds  Hematology negative hematology ROS (+) Sickle cell trait ,   Anesthesia Other Findings   Reproductive/Obstetrics                            Anesthesia Physical Anesthesia Plan  ASA: III  Anesthesia Plan: MAC   Post-op Pain Management:    Induction: Intravenous  PONV Risk Score and Plan: 1 and Propofol infusion and Treatment may vary due to age or medical condition  Airway Management Planned: Natural Airway  Additional Equipment:   Intra-op Plan:   Post-operative Plan:   Informed Consent: I have reviewed the patients History and Physical, chart, labs and discussed the procedure including the risks, benefits and alternatives for the proposed anesthesia with the patient or authorized representative who has indicated his/her understanding and acceptance.     Dental advisory given  Plan Discussed with: CRNA  Anesthesia Plan Comments:         Anesthesia Quick  Evaluation

## 2019-05-16 ENCOUNTER — Encounter (HOSPITAL_COMMUNITY): Payer: Self-pay | Admitting: Gastroenterology

## 2019-05-16 ENCOUNTER — Other Ambulatory Visit: Payer: Self-pay

## 2019-05-16 ENCOUNTER — Ambulatory Visit (HOSPITAL_COMMUNITY): Payer: Medicaid Other | Admitting: Certified Registered"

## 2019-05-16 ENCOUNTER — Telehealth: Payer: Self-pay

## 2019-05-16 ENCOUNTER — Ambulatory Visit (HOSPITAL_COMMUNITY)
Admission: RE | Admit: 2019-05-16 | Discharge: 2019-05-16 | Disposition: A | Discharge status: Home / Self Care | Payer: Medicaid Other | Attending: Gastroenterology | Admitting: Gastroenterology

## 2019-05-16 ENCOUNTER — Encounter (HOSPITAL_COMMUNITY)
Admission: RE | Disposition: A | Discharge status: Home / Self Care | Payer: Self-pay | Source: Home / Self Care | Attending: Gastroenterology

## 2019-05-16 DIAGNOSIS — E282 Polycystic ovarian syndrome: Secondary | ICD-10-CM | POA: Insufficient documentation

## 2019-05-16 DIAGNOSIS — K838 Other specified diseases of biliary tract: Secondary | ICD-10-CM | POA: Insufficient documentation

## 2019-05-16 DIAGNOSIS — Z90411 Acquired partial absence of pancreas: Secondary | ICD-10-CM | POA: Diagnosis not present

## 2019-05-16 DIAGNOSIS — E119 Type 2 diabetes mellitus without complications: Secondary | ICD-10-CM | POA: Diagnosis not present

## 2019-05-16 DIAGNOSIS — D573 Sickle-cell trait: Secondary | ICD-10-CM | POA: Insufficient documentation

## 2019-05-16 DIAGNOSIS — F319 Bipolar disorder, unspecified: Secondary | ICD-10-CM | POA: Diagnosis not present

## 2019-05-16 DIAGNOSIS — K449 Diaphragmatic hernia without obstruction or gangrene: Secondary | ICD-10-CM | POA: Diagnosis not present

## 2019-05-16 DIAGNOSIS — Z833 Family history of diabetes mellitus: Secondary | ICD-10-CM | POA: Insufficient documentation

## 2019-05-16 DIAGNOSIS — K861 Other chronic pancreatitis: Secondary | ICD-10-CM | POA: Diagnosis not present

## 2019-05-16 DIAGNOSIS — F17210 Nicotine dependence, cigarettes, uncomplicated: Secondary | ICD-10-CM | POA: Insufficient documentation

## 2019-05-16 DIAGNOSIS — Z8249 Family history of ischemic heart disease and other diseases of the circulatory system: Secondary | ICD-10-CM | POA: Insufficient documentation

## 2019-05-16 DIAGNOSIS — R933 Abnormal findings on diagnostic imaging of other parts of digestive tract: Secondary | ICD-10-CM

## 2019-05-16 DIAGNOSIS — K219 Gastro-esophageal reflux disease without esophagitis: Secondary | ICD-10-CM | POA: Diagnosis not present

## 2019-05-16 DIAGNOSIS — K862 Cyst of pancreas: Secondary | ICD-10-CM | POA: Diagnosis not present

## 2019-05-16 DIAGNOSIS — J45909 Unspecified asthma, uncomplicated: Secondary | ICD-10-CM | POA: Diagnosis not present

## 2019-05-16 DIAGNOSIS — R932 Abnormal findings on diagnostic imaging of liver and biliary tract: Secondary | ICD-10-CM | POA: Diagnosis present

## 2019-05-16 DIAGNOSIS — K859 Acute pancreatitis without necrosis or infection, unspecified: Secondary | ICD-10-CM

## 2019-05-16 DIAGNOSIS — I10 Essential (primary) hypertension: Secondary | ICD-10-CM | POA: Insufficient documentation

## 2019-05-16 HISTORY — PX: ESOPHAGOGASTRODUODENOSCOPY (EGD) WITH PROPOFOL: SHX5813

## 2019-05-16 HISTORY — PX: EUS: SHX5427

## 2019-05-16 LAB — GLUCOSE, CAPILLARY: Glucose-Capillary: 115 mg/dL — ABNORMAL HIGH (ref 70–99)

## 2019-05-16 SURGERY — UPPER ENDOSCOPIC ULTRASOUND (EUS) RADIAL
Anesthesia: Monitor Anesthesia Care

## 2019-05-16 MED ORDER — LACTATED RINGERS IV SOLN: INTRAVENOUS | Status: DC | PRN

## 2019-05-16 MED ORDER — LACTATED RINGERS IV SOLN
INTRAVENOUS | Status: AC | PRN
  Administered 2019-05-16: 1000 mL via INTRAVENOUS

## 2019-05-16 MED ORDER — PROPOFOL 10 MG/ML IV BOLUS
INTRAVENOUS | Status: DC | PRN
  Administered 2019-05-16 (×2): 20 mg via INTRAVENOUS
  Administered 2019-05-16: 30 mg via INTRAVENOUS

## 2019-05-16 MED ORDER — PROPOFOL 10 MG/ML IV BOLUS
INTRAVENOUS | Status: AC
  Filled 2019-05-16: qty 20

## 2019-05-16 MED ORDER — LIDOCAINE 2% (20 MG/ML) 5 ML SYRINGE
INTRAMUSCULAR | Status: DC | PRN
  Administered 2019-05-16: 40 mg via INTRAVENOUS

## 2019-05-16 MED ORDER — SODIUM CHLORIDE 0.9 % IV SOLN: INTRAVENOUS | Status: DC

## 2019-05-16 MED ORDER — PROPOFOL 500 MG/50ML IV EMUL
INTRAVENOUS | Status: DC | PRN
  Administered 2019-05-16: 150 ug/kg/min via INTRAVENOUS

## 2019-05-16 MED ORDER — PROPOFOL 500 MG/50ML IV EMUL
INTRAVENOUS | Status: AC
  Filled 2019-05-16: qty 50

## 2019-05-16 SURGICAL SUPPLY — 15 items

## 2019-05-16 NOTE — Anesthesia Postprocedure Evaluation (Signed)
Anesthesia Post Note  Patient: [name]  Procedure(s) Performed: UPPER ENDOSCOPIC ULTRASOUND (EUS) RADIAL (N/A ) ESOPHAGOGASTRODUODENOSCOPY (EGD) WITH PROPOFOL (N/A )     Patient location during evaluation: PACU Anesthesia Type: MAC Level of consciousness: awake and alert Pain management: pain level controlled Vital Signs Assessment: post-procedure vital signs reviewed and stable Respiratory status: spontaneous breathing, nonlabored ventilation, respiratory function stable and patient connected to nasal cannula oxygen Cardiovascular status: stable and blood pressure returned to baseline Postop Assessment: no apparent nausea or vomiting Anesthetic complications: no    Last Vitals:  Vitals:   05/16/19 0815 05/16/19 0820  BP:  (!) 126/93  Pulse: 64 75  Resp:    Temp:    SpO2: 99% 93%    Last Pain:  Vitals:   05/16/19 0802  TempSrc: Temporal  PainSc: 0-No pain                 [name] [name]

## 2019-05-16 NOTE — Transfer of Care (Signed)
Immediate Anesthesia Transfer of Care Note  Patient: [name]  Procedure(s) Performed: UPPER ENDOSCOPIC ULTRASOUND (EUS) RADIAL (N/A ) ESOPHAGOGASTRODUODENOSCOPY (EGD) WITH PROPOFOL (N/A )  Patient Location: PACU and Endoscopy Unit  Anesthesia Type:MAC  Level of Consciousness: awake, alert  and oriented  Airway & Oxygen Therapy: Patient Spontanous Breathing and Patient connected to face mask oxygen  Post-op Assessment: Report given to RN and Post -op Vital signs reviewed and stable  Post vital signs: Reviewed and stable  Last Vitals:  Vitals Value Taken Time  BP 122/80 05/16/19 0802  Temp    Pulse 69 05/16/19 0802  Resp 21 05/16/19 0802  SpO2 100 % 05/16/19 0802  Vitals shown include unvalidated device data.  Last Pain:  Vitals:   05/16/19 0802  TempSrc:   PainSc: 0-No pain         Complications: No apparent anesthesia complications

## 2019-05-16 NOTE — Anesthesia Procedure Notes (Signed)
Procedure Name: MAC Date/Time: 05/16/2019 7:34 AM Performed by: Eben Burow, CRNA Pre-anesthesia Checklist: Patient identified, Emergency Drugs available, Suction available, Patient being monitored and Timeout performed Oxygen Delivery Method: Simple face mask Dental Injury: Teeth and Oropharynx as per pre-operative assessment

## 2019-05-16 NOTE — H&P (Signed)
HPI: This is a woman with abnormal bile duct, here for EUS examination   ROS: complete GI ROS as described in HPI, all other review negative.  Constitutional:  No unintentional weight loss   Past Medical History:  Diagnosis Date  . Alcohol abuse   . Anemia   . Asthma    rarely uses inhaler  . Bipolar disorder ([place])    HX PPD after 2014 delivery, no meds  . Bronchitis   . 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 Fibroid   . Hypertension   . Hypertensive retinopathy    OU  . Missed ab    x 2, one requiring D & E  . Pancreatic cyst   . Pancreatitis    diet controlled  . Pelvic inflammatory disease   . Plantar fasciitis   . Polycystic ovary syndrome   . 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 Renal cyst   . Sickle cell trait [facility])     Past Surgical History:  Procedure Laterality Date  . CESAREAN SECTION N/A 05/23/2012   Procedure: CESAREAN SECTION;  Surgeon: Lahoma Crocker, MD;  Location: [facility];  Service: Obstetrics;  Laterality: N/A;  primary  . CESAREAN SECTION N/A 04/18/2014   Procedure: REPEAT CESAREAN SECTION;  Surgeon: Shelly Bombard, MD;  Location: [facility];  Service: Obstetrics;  Laterality: N/A;  . CESAREAN SECTION N/A 11/05/2017   Procedure: REPEAT CESAREAN SECTION;  Surgeon: Woodroe Mode, MD;  Location: [place];  Service: Obstetrics;  Laterality: N/A;  . DILATION AND CURETTAGE OF UTERUS      for MAB  . EUS N/A 06/11/2015   Procedure: UPPER ENDOSCOPIC ULTRASOUND (EUS) RADIAL;  Surgeon: Milus Banister, MD;  [facility];  Service: Endoscopy;  Laterality: N/A;  . PANCREAS SURGERY  2012   s/p partial pancreatectomy  . SPLENECTOMY, TOTAL  2012  . TUBAL LIGATION  11/05/2017  . WISDOM TOOTH EXTRACTION      Current Facility-Administered Medications  Medication Dose Route Frequency Provider Last Rate Last Admin  . 0.9 %  sodium chloride infusion   Intravenous Continuous Milus Banister, MD       . lactated ringers infusion    Continuous PRN Milus Banister, MD 20 mL/hr at 05/16/19 0716 1,000 mL at 05/16/19 0716    Allergies as of 04/24/2019 - Review Complete 04/15/2019  Allergen Reaction Noted  . Dilaudid [hydromorphone hcl] Itching and Other (See Comments) 08/29/2011    Family History  Problem Relation Age of Onset  . Diabetes Mother   . Hypertension Mother   . Asthma Father   . Glaucoma Sister   . Glaucoma Paternal Grandmother   . Colon cancer Neg Hx     Social History   Socioeconomic History  . Marital status: Single    Spouse name: Not on file  . Number of children: 2  . Years of education: Not on file  . Highest education level: Not on file  Occupational History  . Occupation: Homemaker  Tobacco Use  . Smoking status: Current Every Day Smoker    Packs/day: 0.25    Years: 13.00    Pack years: 3.25    Types: Cigarettes  . Smokeless tobacco: Never Used  . Tobacco comment: smokes 4 cigs/day, tobacco info given 04/17/15  Substance and Sexual Activity  . Alcohol use: No    Alcohol/week: 0.0 standard drinks    Comment: social   . Drug use: No  . Sexual  activity: Not Currently    Partners: Male    Birth control/protection: None  Other Topics Concern  . Not on file  Social History Narrative  . Not on file   Social Determinants of Health   Financial Resource Strain:   . Difficulty of Paying Living Expenses:   Food Insecurity:   . Worried About Charity fundraiser in the Last Year:   . Arboriculturist in the Last Year:   Transportation Needs:   . Film/video editor (Medical):   Marland Kitchen Lack of Transportation (Non-Medical):   Physical Activity:   . Days of Exercise per Week:   . Minutes of Exercise per Session:   Stress:   . Feeling of Stress :   Social Connections:   . Frequency of Communication with Friends and Family:   . Frequency of Social Gatherings with Friends and Family:   . Attends Religious Services:   . Active Member of Clubs or  Organizations:   . Attends Archivist Meetings:   Marland Kitchen Marital Status:   Intimate Partner Violence:   . Fear of Current or Ex-Partner:   . Emotionally Abused:   Marland Kitchen Physically Abused:   . Sexually Abused:      Physical Exam: BP 105/76   Temp 98 F (36.7 C) (Oral)   Resp 11   Ht 5\' 1"  (1.549 m)   Wt 68.5 kg   SpO2 100%   Breastfeeding No   BMI 28.53 kg/m  Constitutional: generally well-appearing Psychiatric: alert and oriented x3 Abdomen: soft, nontender, nondistended, no obvious ascites, no peritoneal signs, normal bowel sounds No peripheral edema noted in lower extremities  Assessment and plan: 34 y.o. female with abnormal bile duct  Upper EUS evaluation today  Please see the "Patient Instructions" section for addition details about the plan.  Owens Loffler, MD [facility] 05/16/2019, 7:28 AM

## 2019-05-16 NOTE — Op Note (Signed)
[facility] Patient Name: [name] Procedure Date: 05/16/2019 MRN: [id number] Attending MD: Milus Banister , MD Date of Birth: Jun 28, 1985 CSN: [id number] Age: 34 Admit Type: Outpatient Procedure:                Upper EUS Indications:              Abnormal bile duct on recent MRI; remote tail of                            pancreas resection Providers:                Milus Banister, MD, Cleda Daub, RN, Lazaro Arms, Technician Referring MD:              Medicines:                Monitored Anesthesia Care Complications:            No immediate complications. Estimated blood loss:                            None. Estimated Blood Loss:     Estimated blood loss was minimal. Procedure:                Pre-Anesthesia Assessment:                           - Prior to the procedure, a History and Physical                            was performed, and patient medications and                            allergies were reviewed. The patient's tolerance of                            previous anesthesia was also reviewed. The risks                            and benefits of the procedure and the sedation                            options and risks were discussed with the patient.                            All questions were answered, and informed consent                            was obtained. Prior Anticoagulants: The patient has                            taken no previous anticoagulant or antiplatelet                            agents. ASA  Grade Assessment: II - A patient with                            mild systemic disease. After reviewing the risks                            and benefits, the patient was deemed in                            satisfactory condition to undergo the procedure.                           After obtaining informed consent, the endoscope was                            passed under direct vision. Throughout the                            procedure, the patient's blood pressure, pulse, and                            oxygen saturations were monitored continuously. The                            [id number]) Olympus Radial EUS was                            introduced through the mouth, and advanced to the                            second part of duodenum. The upper EUS was                            accomplished without difficulty. The patient                            tolerated the procedure well. Scope In: Scope Out: Findings:      ENDOSCOPIC FINDING: :      The examined esophagus was endoscopically normal.      Small hiatal hernia, the stomach was otherwise normal.      The examined duodenum was endoscopically normal.      ENDOSONOGRAPHIC FINDING: :      1. CBD, CHD were non-dilated. There was a focal (46mm long) narrowing of       the bile duct at the site of apparent cystic duct takeoff (bile duct       narrowed by 75mm at this site). There are also several small blood       vessels near this site however no tumors or masses.      2. Pancreatic parenchyma was normal in head, neck. s/p tail of pancreas       resection.      3. Two small, reactive appearing periportal lymphnodes.      4. Gallbladder was normal.      5. Limited views of liver were all normal. Impression:               -  Focally narrowed extrahepatic bile duct. This is                            near the site of the cystic duct takeoff as well as                            several small blood vessels. I suspect this simply                            represents slightly aberrant anatomy rather than a                            pathologic process.                           - Her recent worsening abdominal pains are most                            likely from NSAID overuse. No current obvious                            damage to the UGI mucosa, last NSAIDs were about                            6-7 weeks ago. Moderate  Sedation:      Not Applicable - Patient had care per Anesthesia. Recommendation:           - Discharge patient to home (ambulatory).                           - Follow clinically. Procedure Code(s):        --- Professional ---                           [phone], Esophagogastroduodenoscopy, flexible,                            transoral; with endoscopic ultrasound examination                            limited to the esophagus, stomach or duodenum, and                            adjacent structures Diagnosis Code(s):        --- Professional ---                           K83.8, Other specified diseases of biliary tract CPT copyright 2019 American [employer] Association. All rights reserved. The codes documented in this report are preliminary and upon coder review may  be revised to meet current compliance requirements. Milus Banister, MD 05/16/2019 8:11:02 AM This report has been signed electronically. Number of Addenda: 0

## 2019-05-16 NOTE — Discharge Instructions (Signed)
YOU HAD AN ENDOSCOPIC PROCEDURE TODAY: Refer to the procedure report and other information in the discharge instructions given to you for any specific questions about what was found during the examination. If this information does not answer your questions, please call [place] office at [phone] to clarify.  ° °YOU SHOULD EXPECT: Some feelings of bloating in the abdomen. Passage of more gas than usual. Walking can help get rid of the air that was put into your GI tract during the procedure and reduce the bloating. If you had a lower endoscopy (such as a colonoscopy or flexible sigmoidoscopy) you may notice spotting of blood in your stool or on the toilet paper. Some abdominal soreness may be present for a day or two, also. ° °DIET: Your first meal following the procedure should be a light meal and then it is ok to progress to your normal diet. A half-sandwich or bowl of soup is an example of a good first meal. Heavy or fried foods are harder to digest and may make you feel nauseous or bloated. Drink plenty of fluids but you should avoid alcoholic beverages for 24 hours. If you had a esophageal dilation, please see attached instructions for diet.   ° °ACTIVITY: Your care partner should take you home directly after the procedure. You should plan to take it easy, moving slowly for the rest of the day. You can resume normal activity the day after the procedure however YOU SHOULD NOT DRIVE, use power tools, machinery or perform tasks that involve climbing or major physical exertion for 24 hours (because of the sedation medicines used during the test).  ° °SYMPTOMS TO REPORT IMMEDIATELY: °A gastroenterologist can be reached at any hour. Please call [phone]  for any of the following symptoms:  °Following lower endoscopy (colonoscopy, flexible sigmoidoscopy) °Excessive amounts of blood in the stool  °Significant tenderness, worsening of abdominal pains  °Swelling of the abdomen that is new, acute  °Fever of 100° or  higher  °Following upper endoscopy (EGD, EUS, ERCP, esophageal dilation) °Vomiting of blood or coffee ground material  °New, significant abdominal pain  °New, significant chest pain or pain under the shoulder blades  °Painful or persistently difficult swallowing  °New shortness of breath  °Black, tarry-looking or red, bloody stools ° °FOLLOW UP:  °If any biopsies were taken you will be contacted by phone or by letter within the next 1-3 weeks. Call [phone]  if you have not heard about the biopsies in 3 weeks.  °Please also call with any specific questions about appointments or follow up tests. ° °

## 2019-05-16 NOTE — Telephone Encounter (Addendum)
-----   Message from Francia Greaves sent at 05/16/2019  1:59 PM EST ----- Regarding: Needs Hysterectomy STstament Needs hysterectomy statement, surgery 04/13  Called pt; VM left stating pt has paperwork that needs to be signed at our office. Encouraged pt to call or MyChart message with questions. MyChart message sent with more details.

## 2019-05-17 ENCOUNTER — Encounter: Payer: Self-pay | Admitting: *Deleted

## 2019-05-20 DIAGNOSIS — B379 Candidiasis, unspecified: Secondary | ICD-10-CM

## 2019-05-20 MED ORDER — FLUCONAZOLE 150 MG PO TABS: 150.0000 mg | ORAL_TABLET | Freq: Once | ORAL | 0 refills | Status: AC

## 2019-05-21 ENCOUNTER — Encounter: Payer: Medicaid Other | Admitting: Physical Therapy

## 2019-05-22 ENCOUNTER — Other Ambulatory Visit: Payer: Self-pay | Admitting: Physician Assistant

## 2019-05-28 ENCOUNTER — Encounter: Payer: Medicaid Other | Admitting: Physical Therapy

## 2019-05-29 ENCOUNTER — Encounter: Payer: Self-pay | Admitting: Internal Medicine

## 2019-05-29 ENCOUNTER — Other Ambulatory Visit: Payer: Self-pay | Admitting: Internal Medicine

## 2019-05-29 DIAGNOSIS — E139 Other specified diabetes mellitus without complications: Secondary | ICD-10-CM

## 2019-05-29 MED ORDER — METFORMIN HCL 500 MG PO TABS: 500.0000 mg | ORAL_TABLET | Freq: Two times a day (BID) | ORAL | 5 refills | Status: DC

## 2019-06-11 ENCOUNTER — Other Ambulatory Visit: Payer: Self-pay | Admitting: Obstetrics and Gynecology

## 2019-06-11 NOTE — Progress Notes (Signed)
[id number] [place], [place] - [address] [id number] [address] Phone: [phone] Fax: [phone]      Your procedure is scheduled on Tuesday April 13  Report to [facility] Main Entrance "A" at 0830 A.M., and check in at the Admitting office.  Call this number if you have problems the morning of surgery:  [phone]  Call [phone] if you have any questions prior to your surgery date Monday-Friday 8am-4pm    Remember:  Do not eat after midnight the night before your surgery  You may drink clear liquids until 0730 am the morning of your surgery.   Clear liquids allowed are: Water, Non-Citrus Juices (without pulp), Carbonated Beverages, Clear Tea, Black Coffee Only, and Gatorade    Take these medicines the morning of surgery with A SIP OF WATER  HYDROcodone-acetaminophen (NORCO/VICODIN)  If needed for pain  As of today, STOP taking any Aspirin (unless otherwise instructed by your surgeon) and Aspirin containing products, Aleve, Naproxen, Ibuprofen, Motrin, Advil, Goody's, BC's, all herbal medications, fish oil, and all vitamins.   WHAT DO I DO ABOUT MY DIABETES MEDICATION?   Marland Kitchen Do not take oral diabetes medicines (pills) the morning of surgery. metFORMIN (GLUCOPHAGE)     HOW TO MANAGE YOUR DIABETES BEFORE AND AFTER SURGERY  Why is it important to control my blood sugar before and after surgery? . Improving blood sugar levels before and after surgery helps healing and can limit problems. . A way of improving blood sugar control is eating a healthy diet by: o  Eating less sugar and carbohydrates o  Increasing activity/exercise o  Talking with your doctor about reaching your blood sugar goals . High blood sugars (greater than 180 mg/dL) can raise your risk of infections and slow your recovery, so you will need to focus on controlling your diabetes during the weeks before surgery. . Make sure that the  doctor who takes care of your diabetes knows about your planned surgery including the date and location.  How do I manage my blood sugar before surgery? . Check your blood sugar at least 4 times a day, starting 2 days before surgery, to make sure that the level is not too high or low. . Check your blood sugar the morning of your surgery when you wake up and every 2 hours until you get to the Short Stay unit. o If your blood sugar is less than 70 mg/dL, you will need to treat for low blood sugar: - Do not take insulin. - Treat a low blood sugar (less than 70 mg/dL) with  cup of clear juice (cranberry or apple), 4 glucose tablets, OR glucose gel. - Recheck blood sugar in 15 minutes after treatment (to make sure it is greater than 70 mg/dL). If your blood sugar is not greater than 70 mg/dL on recheck, call [phone] for further instructions. . Report your blood sugar to the short stay nurse when you get to Short Stay.  . If you are admitted to the hospital after surgery: o Your blood sugar will be checked by the staff and you will probably be given insulin after surgery (instead of oral diabetes medicines) to make sure you have good blood sugar levels. o The goal for blood sugar control after surgery is 80-180 mg/dL.                     Do not wear jewelry, make up, or nail  polish            Do not wear lotions, powders, perfumes/colognes, or deodorant.            Do not shave 48 hours prior to surgery.  Men may shave face and neck.            Do not bring valuables to the hospital.            [facility] is not responsible for any belongings or valuables.  Do NOT Smoke (Tobacco/Vapping) or drink Alcohol 24 hours prior to your procedure If you use a CPAP at night, you may bring all equipment for your overnight stay.   Contacts, glasses, dentures or bridgework may not be worn into surgery.      For patients admitted to the hospital, discharge time will be determined by your treatment  team.   Patients discharged the day of surgery will not be allowed to drive home, and someone needs to stay with them for 24 hours.    Special instructions:   [facility]- Preparing For Surgery  Before surgery, you can play an important role. Because skin is not sterile, your skin needs to be as free of germs as possible. You can reduce the number of germs on your skin by washing with CHG (chlorahexidine gluconate) Soap before surgery.  CHG is an antiseptic cleaner which kills germs and bonds with the skin to continue killing germs even after washing.    Oral Hygiene is also important to reduce your risk of infection.  Remember - BRUSH YOUR TEETH THE MORNING OF SURGERY WITH YOUR REGULAR TOOTHPASTE  Please do not use if you have an allergy to CHG or antibacterial soaps. If your skin becomes reddened/irritated stop using the CHG.  Do not shave (including legs and underarms) for at least 48 hours prior to first CHG shower. It is OK to shave your face.  Please follow these instructions carefully.   1. Shower the NIGHT BEFORE SURGERY and the MORNING OF SURGERY with CHG Soap.   2. If you chose to wash your hair, wash your hair first as usual with your normal shampoo.  3. After you shampoo, rinse your hair and body thoroughly to remove the shampoo.  4. Use CHG as you would any other liquid soap. You can apply CHG directly to the skin and wash gently with a scrungie or a clean washcloth.   5. Apply the CHG Soap to your body ONLY FROM THE NECK DOWN.  Do not use on open wounds or open sores. Avoid contact with your eyes, ears, mouth and genitals (private parts). Wash Face and genitals (private parts)  with your normal soap.   6. Wash thoroughly, paying special attention to the area where your surgery will be performed.  7. Thoroughly rinse your body with warm water from the neck down.  8. DO NOT shower/wash with your normal soap after using and rinsing off the CHG Soap.  9. Pat yourself dry  with a CLEAN TOWEL.  10. Wear CLEAN PAJAMAS to bed the night before surgery, wear comfortable clothes the morning of surgery  11. Place CLEAN SHEETS on your bed the night of your first shower and DO NOT SLEEP WITH PETS.   Day of Surgery:   Do not apply any deodorants/lotions.  Please wear clean clothes to the hospital/surgery center.   Remember to brush your teeth WITH YOUR REGULAR TOOTHPASTE.   Please read over the following fact sheets that you were  given.

## 2019-06-12 ENCOUNTER — Encounter (HOSPITAL_COMMUNITY): Payer: Self-pay

## 2019-06-12 ENCOUNTER — Encounter (HOSPITAL_COMMUNITY)
Admission: RE | Admit: 2019-06-12 | Discharge: 2019-06-12 | Disposition: A | Discharge status: Home / Self Care | Payer: Medicaid Other | Source: Ambulatory Visit | Attending: Obstetrics and Gynecology | Admitting: Obstetrics and Gynecology

## 2019-06-12 ENCOUNTER — Other Ambulatory Visit: Payer: Self-pay

## 2019-06-12 DIAGNOSIS — R9431 Abnormal electrocardiogram [ECG] [EKG]: Secondary | ICD-10-CM | POA: Diagnosis not present

## 2019-06-12 DIAGNOSIS — I498 Other specified cardiac arrhythmias: Secondary | ICD-10-CM | POA: Insufficient documentation

## 2019-06-12 DIAGNOSIS — Z01818 Encounter for other preprocedural examination: Secondary | ICD-10-CM | POA: Diagnosis not present

## 2019-06-12 DIAGNOSIS — E119 Type 2 diabetes mellitus without complications: Secondary | ICD-10-CM | POA: Insufficient documentation

## 2019-06-12 DIAGNOSIS — D649 Anemia, unspecified: Secondary | ICD-10-CM | POA: Insufficient documentation

## 2019-06-12 HISTORY — DX: Pneumonia, unspecified organism: J18.9

## 2019-06-12 LAB — CBC
HCT: 33.8 % — ABNORMAL LOW (ref 36.0–46.0)
Hemoglobin: 10.9 g/dL — ABNORMAL LOW (ref 12.0–15.0)
MCH: 21.5 pg — ABNORMAL LOW (ref 26.0–34.0)
MCHC: 32.2 g/dL (ref 30.0–36.0)
MCV: 66.8 fL — ABNORMAL LOW (ref 80.0–100.0)
Platelets: 545 10*3/uL — ABNORMAL HIGH (ref 150–400)
RBC: 5.06 MIL/uL (ref 3.87–5.11)
RDW: 16.7 % — ABNORMAL HIGH (ref 11.5–15.5)
WBC: 4.8 10*3/uL (ref 4.0–10.5)
nRBC: 0 % (ref 0.0–0.2)

## 2019-06-12 LAB — COMPREHENSIVE METABOLIC PANEL
ALT: 14 U/L (ref 0–44)
AST: 20 U/L (ref 15–41)
Albumin: 3.8 g/dL (ref 3.5–5.0)
Alkaline Phosphatase: 61 U/L (ref 38–126)
Anion gap: 7 (ref 5–15)
BUN: 9 mg/dL (ref 6–20)
CO2: 25 mmol/L (ref 22–32)
Calcium: 9 mg/dL (ref 8.9–10.3)
Chloride: 107 mmol/L (ref 98–111)
Creatinine, Ser: 0.63 mg/dL (ref 0.44–1.00)
GFR calc Af Amer: >60 mL/min (ref >60)
GFR calc non Af Amer: >60 mL/min (ref >60)
Glucose, Bld: 111 mg/dL — ABNORMAL HIGH (ref 70–99)
Potassium: 4 mmol/L (ref 3.5–5.1)
Sodium: 139 mmol/L (ref 135–145)
Total Bilirubin: 0.4 mg/dL (ref 0.3–1.2)
Total Protein: 7.1 g/dL (ref 6.5–8.1)

## 2019-06-12 LAB — ABO/RH: ABO/RH(D): B POS

## 2019-06-12 LAB — TYPE AND SCREEN
ABO/RH(D): B POS
Antibody Screen: NEGATIVE

## 2019-06-12 LAB — GLUCOSE, CAPILLARY: Glucose-Capillary: 162 mg/dL — ABNORMAL HIGH (ref 70–99)

## 2019-06-12 NOTE — Progress Notes (Signed)
PCP - Deanna Artis @ [facility] Cardiologist - na    Chest x-ray - na EKG - 06/12/19 Stress Test - na ECHO - na Cardiac Cath - na  Sleep Study - na  Fasting Blood Sugar 100-110 Checks Blood Sugar ___4__ times a day  Blood Thinner Instructions:na Aspirin Instructions:  ERAS Protcol -yes PRE-SURGERY Ensure  None ordered  COVID TEST- 06/15/19   Anesthesia review: Pt. Denies difficulty airway. Could not find any documentation of intubations stating this in her records from previous surgeries.  Patient denies shortness of breath, fever, cough and chest pain at PAT appointment   All instructions explained to the patient, with a verbal understanding of the material. Patient agrees to go over the instructions while at home for a better understanding. Patient also instructed to self quarantine after being tested for COVID-19. The opportunity to ask questions was provided.

## 2019-06-13 ENCOUNTER — Encounter: Payer: Self-pay | Admitting: *Deleted

## 2019-06-13 LAB — HEMOGLOBIN A1C
Hgb A1c MFr Bld: 6 % — ABNORMAL HIGH (ref 4.8–5.6)
Mean Plasma Glucose: 126 mg/dL

## 2019-06-13 NOTE — Anesthesia Preprocedure Evaluation (Addendum)
Anesthesia Evaluation  Patient identified by MRN, date of birth, ID band Patient awake    Reviewed: Allergy & Precautions, NPO status , Patient's Chart, lab work & pertinent test results  Airway Mallampati: II  TM Distance: >3 FB Neck ROM: Full    Dental no notable dental hx. (+) Teeth Intact, Dental Advisory Given   Pulmonary asthma , Current Smoker,  Rarely uses rescue inhaler 4 pack year history, still smoking    Pulmonary exam normal breath sounds clear to auscultation       Cardiovascular negative cardio ROS Normal cardiovascular exam Rhythm:Regular Rate:Normal     Neuro/Psych PSYCHIATRIC DISORDERS Depression negative neurological ROS     GI/Hepatic GERD  Medicated and Controlled,(+)     substance abuse  alcohol use,   Endo/Other  diabetes, Well Controlled, Type 2, Oral Hypoglycemic AgentsS/p partial pancreatectomy for cyst removal Last A1c 6.0  Renal/GU Renal diseasenegative Renal ROS  Female GU complaint Pelvic inflammatory disease, PCOS, uterine fibroid    Musculoskeletal negative musculoskeletal ROS (+)   Abdominal Normal abdominal exam  (+)   Peds negative pediatric ROS (+)  Hematology  (+) Blood dyscrasia, Sickle cell trait and anemia , hct 33.8   Anesthesia Other Findings   Reproductive/Obstetrics negative OB ROS                                                             Anesthesia Evaluation  Patient identified by MRN, date of birth, ID band Patient awake    Reviewed: Allergy & Precautions, NPO status , Patient's Chart, lab work & pertinent test results  History of Anesthesia Complications (+) DIFFICULT AIRWAY  Airway Mallampati: I  TM Distance: >3 FB Neck ROM: Full    Dental no notable dental hx. (+) Teeth Intact, Dental Advisory Given   Pulmonary asthma , Current Smoker,    Pulmonary exam normal breath sounds clear to auscultation       Cardiovascular hypertension, Normal cardiovascular exam Rhythm:Regular Rate:Normal     Neuro/Psych PSYCHIATRIC DISORDERS Depression Bipolar Disorder negative neurological ROS     GI/Hepatic GERD  Medicated,(+)     substance abuse  alcohol use,   Endo/Other  negative endocrine ROSdiabetes, Oral Hypoglycemic Agents  Renal/GU negative Renal ROS  negative genitourinary   Musculoskeletal negative musculoskeletal ROS (+)   Abdominal   Peds  Hematology negative hematology ROS (+) Sickle cell trait ,   Anesthesia Other Findings   Reproductive/Obstetrics                            Anesthesia Physical Anesthesia Plan  ASA: III  Anesthesia Plan: MAC   Post-op Pain Management:    Induction: Intravenous  PONV Risk Score and Plan: 1 and Propofol infusion and Treatment may vary due to age or medical condition  Airway Management Planned: Natural Airway  Additional Equipment:   Intra-op Plan:   Post-operative Plan:   Informed Consent: I have reviewed the patients History and Physical, chart, labs and discussed the procedure including the risks, benefits and alternatives for the proposed anesthesia with the patient or authorized representative who has indicated his/her understanding and acceptance.     Dental advisory given  Plan Discussed with: CRNA  Anesthesia Plan Comments:  Anesthesia Quick Evaluation  Anesthesia Physical Anesthesia Plan  ASA: II  Anesthesia Plan: General   Post-op Pain Management:    Induction: Intravenous  PONV Risk Score and Plan: 3 and Ondansetron, Dexamethasone, Midazolam, Scopolamine patch - Pre-op and Treatment may vary due to age or medical condition  Airway Management Planned: Oral ETT  Additional Equipment: None  Intra-op Plan:   Post-operative Plan: Extubation in OR  Informed Consent: I have reviewed the patients History and Physical, chart, labs and discussed the procedure  including the risks, benefits and alternatives for the proposed anesthesia with the patient or authorized representative who has indicated his/her understanding and acceptance.     Dental advisory given  Plan Discussed with: CRNA  Anesthesia Plan Comments:      Anesthesia Quick Evaluation

## 2019-06-13 NOTE — Progress Notes (Signed)
Anesthesia Chart Review:  "Difficult Airway" evidently entered into pt chart at time of last procedure, EGD 05/16/19 under MAC. No complications noted in Epic. Pt denied any history of difficult intubation. She reports she has only ever been intubated when she had GA for cholecystectomy many years ago and says there were no anesthesia complications. No other documented history of difficult intubation.   Preop labs reviewed, mild anemia Hgb 10.9. DMII well controlled A1c 6.0.  EKG 06/12/19: Normal sinus rhythm with sinus arrhythmia. Rate 68. Septal infarct , age undetermined. No significant change since last tracing   Wynonia Musty [facility] Short Stay Center/Anesthesiology Phone [phone] 06/13/2019 11:31 AM

## 2019-06-15 ENCOUNTER — Other Ambulatory Visit (HOSPITAL_COMMUNITY)
Admission: RE | Admit: 2019-06-15 | Discharge: 2019-06-15 | Disposition: A | Discharge status: Home / Self Care | Payer: Medicaid Other | Source: Ambulatory Visit | Attending: Obstetrics and Gynecology | Admitting: Obstetrics and Gynecology

## 2019-06-15 DIAGNOSIS — Z01812 Encounter for preprocedural laboratory examination: Secondary | ICD-10-CM | POA: Insufficient documentation

## 2019-06-15 DIAGNOSIS — Z20822 Contact with and (suspected) exposure to covid-19: Secondary | ICD-10-CM | POA: Diagnosis not present

## 2019-06-15 LAB — SARS CORONAVIRUS 2 (TAT 6-24 HRS): SARS Coronavirus 2: NEGATIVE

## 2019-06-18 ENCOUNTER — Inpatient Hospital Stay (HOSPITAL_COMMUNITY): Payer: Medicaid Other | Admitting: Anesthesiology

## 2019-06-18 ENCOUNTER — Observation Stay (HOSPITAL_COMMUNITY)
Admission: RE | Admit: 2019-06-18 | Discharge: 2019-06-19 | Disposition: A | Discharge status: Home / Self Care | Payer: Medicaid Other | Attending: Obstetrics and Gynecology | Admitting: Obstetrics and Gynecology

## 2019-06-18 ENCOUNTER — Encounter (HOSPITAL_COMMUNITY): Payer: Self-pay | Admitting: Obstetrics and Gynecology

## 2019-06-18 ENCOUNTER — Other Ambulatory Visit: Payer: Self-pay

## 2019-06-18 ENCOUNTER — Inpatient Hospital Stay (HOSPITAL_COMMUNITY): Payer: Medicaid Other | Admitting: Physician Assistant

## 2019-06-18 ENCOUNTER — Encounter (HOSPITAL_COMMUNITY)
Admission: RE | Disposition: A | Discharge status: Home / Self Care | Payer: Self-pay | Source: Home / Self Care | Attending: Obstetrics and Gynecology

## 2019-06-18 DIAGNOSIS — Z90411 Acquired partial absence of pancreas: Secondary | ICD-10-CM | POA: Diagnosis not present

## 2019-06-18 DIAGNOSIS — K8681 Exocrine pancreatic insufficiency: Secondary | ICD-10-CM | POA: Diagnosis not present

## 2019-06-18 DIAGNOSIS — R102 Pelvic and perineal pain: Secondary | ICD-10-CM | POA: Diagnosis not present

## 2019-06-18 DIAGNOSIS — F17210 Nicotine dependence, cigarettes, uncomplicated: Secondary | ICD-10-CM | POA: Insufficient documentation

## 2019-06-18 DIAGNOSIS — N946 Dysmenorrhea, unspecified: Secondary | ICD-10-CM | POA: Diagnosis not present

## 2019-06-18 DIAGNOSIS — E282 Polycystic ovarian syndrome: Secondary | ICD-10-CM | POA: Insufficient documentation

## 2019-06-18 DIAGNOSIS — E139 Other specified diabetes mellitus without complications: Secondary | ICD-10-CM | POA: Diagnosis not present

## 2019-06-18 DIAGNOSIS — Z9081 Acquired absence of spleen: Secondary | ICD-10-CM | POA: Diagnosis not present

## 2019-06-18 DIAGNOSIS — Z90710 Acquired absence of both cervix and uterus: Secondary | ICD-10-CM | POA: Diagnosis present

## 2019-06-18 DIAGNOSIS — Z885 Allergy status to narcotic agent status: Secondary | ICD-10-CM | POA: Diagnosis not present

## 2019-06-18 DIAGNOSIS — Z7984 Long term (current) use of oral hypoglycemic drugs: Secondary | ICD-10-CM | POA: Insufficient documentation

## 2019-06-18 DIAGNOSIS — E891 Postprocedural hypoinsulinemia: Secondary | ICD-10-CM | POA: Insufficient documentation

## 2019-06-18 DIAGNOSIS — D573 Sickle-cell trait: Secondary | ICD-10-CM | POA: Insufficient documentation

## 2019-06-18 DIAGNOSIS — N72 Inflammatory disease of cervix uteri: Secondary | ICD-10-CM | POA: Diagnosis not present

## 2019-06-18 DIAGNOSIS — N920 Excessive and frequent menstruation with regular cycle: Secondary | ICD-10-CM | POA: Diagnosis present

## 2019-06-18 DIAGNOSIS — E119 Type 2 diabetes mellitus without complications: Secondary | ICD-10-CM | POA: Diagnosis not present

## 2019-06-18 HISTORY — PX: TOTAL LAPAROSCOPIC HYSTERECTOMY WITH SALPINGECTOMY: SHX6742

## 2019-06-18 HISTORY — PX: CYSTOSCOPY: SHX5120

## 2019-06-18 LAB — GLUCOSE, CAPILLARY
Glucose-Capillary: 128 mg/dL — ABNORMAL HIGH (ref 70–99)
Glucose-Capillary: 160 mg/dL — ABNORMAL HIGH (ref 70–99)
Glucose-Capillary: 148 mg/dL — ABNORMAL HIGH (ref 70–99)
Glucose-Capillary: 177 mg/dL — ABNORMAL HIGH (ref 70–99)

## 2019-06-18 LAB — POCT PREGNANCY, URINE: Preg Test, Ur: NEGATIVE

## 2019-06-18 SURGERY — HYSTERECTOMY, TOTAL, LAPAROSCOPIC, WITH SALPINGECTOMY
Anesthesia: General | Site: Bladder

## 2019-06-18 MED ORDER — FENTANYL CITRATE (PF) 250 MCG/5ML IJ SOLN
INTRAMUSCULAR | Status: AC
  Filled 2019-06-18: qty 5

## 2019-06-18 MED ORDER — FENTANYL CITRATE (PF) 100 MCG/2ML IJ SOLN
INTRAMUSCULAR | Status: AC
  Filled 2019-06-18: qty 2

## 2019-06-18 MED ORDER — PANCRELIPASE (LIP-PROT-AMYL) 12000-38000 UNITS PO CPEP
72000.0000 [IU] | ORAL_CAPSULE | Freq: Three times a day (TID) | ORAL | Status: DC
  Administered 2019-06-18 – 2019-06-19 (×2): 72000 [IU] via ORAL
  Filled 2019-06-18 (×2): qty 6

## 2019-06-18 MED ORDER — SIMETHICONE 80 MG PO CHEW
80.0000 mg | CHEWABLE_TABLET | Freq: Three times a day (TID) | ORAL | Status: DC
  Administered 2019-06-18 – 2019-06-19 (×3): 80 mg via ORAL
  Filled 2019-06-18 (×3): qty 1

## 2019-06-18 MED ORDER — MIDAZOLAM HCL 2 MG/2ML IJ SOLN
INTRAMUSCULAR | Status: AC
  Filled 2019-06-18: qty 2

## 2019-06-18 MED ORDER — OXYCODONE HCL 5 MG/5ML PO SOLN: 5.0000 mg | Freq: Once | ORAL | Status: DC | PRN

## 2019-06-18 MED ORDER — FLUORESCEIN SODIUM 10 % IV SOLN
INTRAVENOUS | Status: AC
  Filled 2019-06-18: qty 5

## 2019-06-18 MED ORDER — PHENYLEPHRINE 40 MCG/ML (10ML) SYRINGE FOR IV PUSH (FOR BLOOD PRESSURE SUPPORT)
PREFILLED_SYRINGE | INTRAVENOUS | Status: AC
  Filled 2019-06-18: qty 30

## 2019-06-18 MED ORDER — BUPIVACAINE HCL (PF) 0.5 % IJ SOLN
INTRAMUSCULAR | Status: AC
  Filled 2019-06-18: qty 30

## 2019-06-18 MED ORDER — INSULIN ASPART 100 UNIT/ML ~~LOC~~ SOLN: 0.0000 [IU] | Freq: Three times a day (TID) | SUBCUTANEOUS | Status: DC

## 2019-06-18 MED ORDER — PHENYLEPHRINE HCL (PRESSORS) 10 MG/ML IV SOLN
INTRAVENOUS | Status: AC
  Filled 2019-06-18: qty 1

## 2019-06-18 MED ORDER — LIDOCAINE 2% (20 MG/ML) 5 ML SYRINGE
INTRAMUSCULAR | Status: DC | PRN
  Administered 2019-06-18: 60 mg via INTRAVENOUS

## 2019-06-18 MED ORDER — LACTATED RINGERS IV SOLN: INTRAVENOUS | Status: DC

## 2019-06-18 MED ORDER — SUGAMMADEX SODIUM 200 MG/2ML IV SOLN
INTRAVENOUS | Status: DC | PRN
  Administered 2019-06-18: 200 mg via INTRAVENOUS

## 2019-06-18 MED ORDER — PROPOFOL 10 MG/ML IV BOLUS
INTRAVENOUS | Status: AC
  Filled 2019-06-18: qty 20

## 2019-06-18 MED ORDER — ONDANSETRON HCL 4 MG/2ML IJ SOLN: 4.0000 mg | Freq: Four times a day (QID) | INTRAMUSCULAR | Status: DC | PRN

## 2019-06-18 MED ORDER — FENTANYL CITRATE (PF) 100 MCG/2ML IJ SOLN
50.0000 ug | INTRAMUSCULAR | Status: DC | PRN
  Administered 2019-06-18: 50 ug via INTRAVENOUS
  Filled 2019-06-18: qty 2

## 2019-06-18 MED ORDER — PANTOPRAZOLE SODIUM 40 MG PO TBEC
40.0000 mg | DELAYED_RELEASE_TABLET | Freq: Every evening | ORAL | Status: DC
  Administered 2019-06-18: 17:00:00 40 mg via ORAL
  Filled 2019-06-18: qty 1

## 2019-06-18 MED ORDER — OXYCODONE HCL 5 MG PO TABS: 5.0000 mg | ORAL_TABLET | Freq: Once | ORAL | Status: DC | PRN

## 2019-06-18 MED ORDER — OXYCODONE-ACETAMINOPHEN 5-325 MG PO TABS: 1.0000 | ORAL_TABLET | Freq: Four times a day (QID) | ORAL | 0 refills | Status: DC | PRN

## 2019-06-18 MED ORDER — SCOPOLAMINE 1 MG/3DAYS TD PT72
1.0000 | MEDICATED_PATCH | TRANSDERMAL | Status: DC
  Administered 2019-06-18: 09:00:00 1.5 mg via TRANSDERMAL
  Filled 2019-06-18: qty 1

## 2019-06-18 MED ORDER — SIMETHICONE 80 MG PO CHEW: 80.0000 mg | CHEWABLE_TABLET | Freq: Four times a day (QID) | ORAL | 0 refills | Status: DC | PRN

## 2019-06-18 MED ORDER — POLYETHYLENE GLYCOL 3350 17 G PO PACK: 17.0000 g | PACK | Freq: Every day | ORAL | Status: DC

## 2019-06-18 MED ORDER — MORPHINE SULFATE (PF) 2 MG/ML IV SOLN: 1.0000 mg | INTRAVENOUS | Status: DC | PRN

## 2019-06-18 MED ORDER — PHENYLEPHRINE 40 MCG/ML (10ML) SYRINGE FOR IV PUSH (FOR BLOOD PRESSURE SUPPORT)
PREFILLED_SYRINGE | INTRAVENOUS | Status: DC | PRN
  Administered 2019-06-18 (×2): 80 ug via INTRAVENOUS

## 2019-06-18 MED ORDER — ROCURONIUM BROMIDE 10 MG/ML (PF) SYRINGE
PREFILLED_SYRINGE | INTRAVENOUS | Status: DC | PRN
  Administered 2019-06-18: 20 mg via INTRAVENOUS
  Administered 2019-06-18: 60 mg via INTRAVENOUS
  Administered 2019-06-18: 10 mg via INTRAVENOUS

## 2019-06-18 MED ORDER — IBUPROFEN 600 MG PO TABS: 600.0000 mg | ORAL_TABLET | Freq: Four times a day (QID) | ORAL | 0 refills | Status: DC | PRN

## 2019-06-18 MED ORDER — CEFAZOLIN SODIUM-DEXTROSE 2-4 GM/100ML-% IV SOLN
2.0000 g | INTRAVENOUS | Status: AC
  Administered 2019-06-18: 2 g via INTRAVENOUS
  Filled 2019-06-18: qty 100

## 2019-06-18 MED ORDER — INSULIN ASPART 100 UNIT/ML ~~LOC~~ SOLN: 0.0000 [IU] | Freq: Every day | SUBCUTANEOUS | Status: DC

## 2019-06-18 MED ORDER — KETOROLAC TROMETHAMINE 30 MG/ML IJ SOLN
30.0000 mg | Freq: Once | INTRAMUSCULAR | Status: AC | PRN
  Administered 2019-06-18: 30 mg via INTRAVENOUS

## 2019-06-18 MED ORDER — POLYETHYLENE GLYCOL 3350 17 G PO PACK: 17.0000 g | PACK | Freq: Every day | ORAL | 0 refills | Status: AC

## 2019-06-18 MED ORDER — OXYCODONE HCL 5 MG PO TABS
5.0000 mg | ORAL_TABLET | ORAL | Status: DC | PRN
  Administered 2019-06-18 – 2019-06-19 (×3): 10 mg via ORAL
  Filled 2019-06-18 (×3): qty 2

## 2019-06-18 MED ORDER — 0.9 % SODIUM CHLORIDE (POUR BTL) OPTIME
TOPICAL | Status: DC | PRN
  Administered 2019-06-18: 1000 mL

## 2019-06-18 MED ORDER — PHENYLEPHRINE HCL-NACL 10-0.9 MG/250ML-% IV SOLN
INTRAVENOUS | Status: DC | PRN
  Administered 2019-06-18: 25 ug/min via INTRAVENOUS

## 2019-06-18 MED ORDER — MIDAZOLAM HCL 2 MG/2ML IJ SOLN
INTRAMUSCULAR | Status: DC | PRN
  Administered 2019-06-18: 2 mg via INTRAVENOUS

## 2019-06-18 MED ORDER — BUPIVACAINE HCL 0.5 % IJ SOLN
INTRAMUSCULAR | Status: DC | PRN
  Administered 2019-06-18: 14 mL

## 2019-06-18 MED ORDER — SODIUM CHLORIDE 0.9 % IR SOLN
Status: DC | PRN
  Administered 2019-06-18: 3000 mL

## 2019-06-18 MED ORDER — FENTANYL CITRATE (PF) 250 MCG/5ML IJ SOLN
INTRAMUSCULAR | Status: DC | PRN
  Administered 2019-06-18 (×2): 100 ug via INTRAVENOUS
  Administered 2019-06-18: 50 ug via INTRAVENOUS

## 2019-06-18 MED ORDER — ACETAMINOPHEN 500 MG PO TABS
1000.0000 mg | ORAL_TABLET | Freq: Once | ORAL | Status: AC
  Administered 2019-06-18: 09:00:00 1000 mg via ORAL
  Filled 2019-06-18: qty 2

## 2019-06-18 MED ORDER — IBUPROFEN 600 MG PO TABS
600.0000 mg | ORAL_TABLET | Freq: Four times a day (QID) | ORAL | Status: DC
  Administered 2019-06-18 – 2019-06-19 (×2): 600 mg via ORAL
  Filled 2019-06-18 (×2): qty 1

## 2019-06-18 MED ORDER — FLUORESCEIN SODIUM 10 % IV SOLN
INTRAVENOUS | Status: DC | PRN
  Administered 2019-06-18: 50 mg via INTRAVENOUS

## 2019-06-18 MED ORDER — FENTANYL CITRATE (PF) 100 MCG/2ML IJ SOLN
25.0000 ug | INTRAMUSCULAR | Status: DC | PRN
  Administered 2019-06-18 (×3): 50 ug via INTRAVENOUS

## 2019-06-18 MED ORDER — PROPOFOL 10 MG/ML IV BOLUS
INTRAVENOUS | Status: DC | PRN
  Administered 2019-06-18: 200 mg via INTRAVENOUS

## 2019-06-18 MED ORDER — PANCRELIPASE (LIP-PROT-AMYL) 12000-38000 UNITS PO CPEP: 36000.0000 [IU] | ORAL_CAPSULE | ORAL | Status: DC | PRN

## 2019-06-18 MED ORDER — MEPERIDINE HCL 25 MG/ML IJ SOLN: 6.2500 mg | INTRAMUSCULAR | Status: DC | PRN

## 2019-06-18 MED ORDER — ONDANSETRON HCL 4 MG/2ML IJ SOLN
INTRAMUSCULAR | Status: DC | PRN
  Administered 2019-06-18: 4 mg via INTRAVENOUS

## 2019-06-18 MED ORDER — DEXAMETHASONE SODIUM PHOSPHATE 10 MG/ML IJ SOLN
INTRAMUSCULAR | Status: AC
  Filled 2019-06-18: qty 1

## 2019-06-18 MED ORDER — LACTATED RINGERS IV SOLN: INTRAVENOUS | Status: AC

## 2019-06-18 MED ORDER — PROMETHAZINE HCL 25 MG/ML IJ SOLN: 6.2500 mg | INTRAMUSCULAR | Status: DC | PRN

## 2019-06-18 MED ORDER — ONDANSETRON HCL 4 MG PO TABS: 4.0000 mg | ORAL_TABLET | Freq: Four times a day (QID) | ORAL | Status: DC | PRN

## 2019-06-18 MED ORDER — KETOROLAC TROMETHAMINE 30 MG/ML IJ SOLN
INTRAMUSCULAR | Status: AC
  Filled 2019-06-18: qty 1

## 2019-06-18 MED ORDER — ROCURONIUM BROMIDE 10 MG/ML (PF) SYRINGE
PREFILLED_SYRINGE | INTRAVENOUS | Status: AC
  Filled 2019-06-18: qty 20

## 2019-06-18 MED ORDER — DEXAMETHASONE SODIUM PHOSPHATE 10 MG/ML IJ SOLN
INTRAMUSCULAR | Status: DC | PRN
  Administered 2019-06-18: 10 mg via INTRAVENOUS

## 2019-06-18 MED ORDER — ONDANSETRON HCL 4 MG/2ML IJ SOLN
INTRAMUSCULAR | Status: AC
  Filled 2019-06-18: qty 2

## 2019-06-18 SURGICAL SUPPLY — 60 items
ADH SKN CLS APL DERMABOND .7 (GAUZE/BANDAGES/DRESSINGS) ×2
APL SRG 38 LTWT LNG FL B (MISCELLANEOUS)
APPLICATOR ARISTA FLEXITIP XL (MISCELLANEOUS) IMPLANT
BARRIER ADHS 3X4 INTERCEED (GAUZE/BANDAGES/DRESSINGS) IMPLANT
BLADE SURG 15 STRL LF DISP TIS (BLADE) ×2 IMPLANT
BLADE SURG 15 STRL SS (BLADE) ×4
BRR ADH 4X3 ABS CNTRL BYND (GAUZE/BANDAGES/DRESSINGS)
CABLE HIGH FREQUENCY MONO STRZ (ELECTRODE) ×4 IMPLANT
CANISTER SUCT 3000ML PPV (MISCELLANEOUS) ×4 IMPLANT
DECANTER SPIKE VIAL GLASS SM (MISCELLANEOUS) ×4 IMPLANT
DEFOGGER SCOPE WARMER CLEARIFY (MISCELLANEOUS) ×4 IMPLANT
DERMABOND ADVANCED (GAUZE/BANDAGES/DRESSINGS) ×2
DERMABOND ADVANCED .7 DNX12 (GAUZE/BANDAGES/DRESSINGS) ×2 IMPLANT
DEVICE SUTURE ENDOST 10MM (ENDOMECHANICALS) ×2 IMPLANT
DEVICE TROCAR PUNCTURE CLOSURE (ENDOMECHANICALS) ×2 IMPLANT
DURAPREP 26ML APPLICATOR (WOUND CARE) ×4 IMPLANT
GLOVE BIO SURGEON STRL SZ7.5 (GLOVE) ×2 IMPLANT
GLOVE BIOGEL PI IND STRL 6.5 (GLOVE) IMPLANT
GLOVE BIOGEL PI IND STRL 7.0 (GLOVE) ×4 IMPLANT
GLOVE BIOGEL PI IND STRL 7.5 (GLOVE) ×4 IMPLANT
GLOVE BIOGEL PI INDICATOR 6.5 (GLOVE) ×2
GLOVE BIOGEL PI INDICATOR 7.0 (GLOVE) ×4
GLOVE BIOGEL PI INDICATOR 7.5 (GLOVE) ×6
GLOVE SURG SS PI 6.5 STRL IVOR (GLOVE) ×2 IMPLANT
GLOVE SURG SS PI 7.0 STRL IVOR (GLOVE) ×16 IMPLANT
GOWN STRL REUS W/ TWL LRG LVL3 (GOWN DISPOSABLE) ×6 IMPLANT
GOWN STRL REUS W/ TWL XL LVL3 (GOWN DISPOSABLE) ×4 IMPLANT
GOWN STRL REUS W/TWL LRG LVL3 (GOWN DISPOSABLE) ×12
GOWN STRL REUS W/TWL XL LVL3 (GOWN DISPOSABLE) ×8
HEMOSTAT ARISTA ABSORB 3G PWDR (HEMOSTASIS) IMPLANT
HIBICLENS CHG 4% 4OZ BTL (MISCELLANEOUS) ×8 IMPLANT
KIT TURNOVER KIT B (KITS) ×4 IMPLANT
LIGASURE VESSEL 5MM BLUNT TIP (ELECTROSURGICAL) ×2 IMPLANT
MANIPULATOR VCARE LG CRV RETR (MISCELLANEOUS) ×2 IMPLANT
MANIPULATOR VCARE SML CRV RETR (MISCELLANEOUS) IMPLANT
MANIPULATOR VCARE STD CRV RETR (MISCELLANEOUS) IMPLANT
OCCLUDER COLPOPNEUMO (BALLOONS) ×4 IMPLANT
PACK LAPAROSCOPY BASIN (CUSTOM PROCEDURE TRAY) ×4 IMPLANT
PACK TRENDGUARD 450 HYBRID PRO (MISCELLANEOUS) IMPLANT
PAD ARMBOARD 7.5X6 YLW CONV (MISCELLANEOUS) ×8 IMPLANT
PAD OB MATERNITY 4.3X12.25 (PERSONAL CARE ITEMS) ×4 IMPLANT
POUCH LAPAROSCOPIC INSTRUMENT (MISCELLANEOUS) ×8 IMPLANT
SCISSORS LAP 5X35 DISP (ENDOMECHANICALS) ×4 IMPLANT
SET IRRIG TUBING LAPAROSCOPIC (IRRIGATION / IRRIGATOR) ×4 IMPLANT
SET IRRIG Y TYPE TUR BLADDER L (SET/KITS/TRAYS/PACK) IMPLANT
SET TUBE SMOKE EVAC HIGH FLOW (TUBING) ×4 IMPLANT
SOLUTION ELECTROLUBE (MISCELLANEOUS) IMPLANT
SUT ENDO VLOC 180-0-8IN (SUTURE) ×2 IMPLANT
SUT MNCRL AB 4-0 PS2 18 (SUTURE) ×8 IMPLANT
SUT VICRYL 0 UR6 27IN ABS (SUTURE) ×8 IMPLANT
SYR 10ML LL (SYRINGE) ×4 IMPLANT
SYR 50ML LL SCALE MARK (SYRINGE) ×4 IMPLANT
SYSTEM CARTER THOMASON II (TROCAR) IMPLANT
TOWEL GREEN STERILE FF (TOWEL DISPOSABLE) ×8 IMPLANT
TRAY FOLEY W/BAG SLVR 14FR LF (SET/KITS/TRAYS/PACK) ×4 IMPLANT
TRENDGUARD 450 HYBRID PRO PACK (MISCELLANEOUS) ×4
TROCAR ADV FIXATION 5X100MM (TROCAR) IMPLANT
TROCAR BALLN 12MMX100 BLUNT (TROCAR) ×2 IMPLANT
TROCAR XCEL NON-BLD 11X100MML (ENDOMECHANICALS) IMPLANT
UNDERPAD 30X36 HEAVY ABSORB (UNDERPADS AND DIAPERS) ×4 IMPLANT

## 2019-06-18 NOTE — Progress Notes (Signed)
GYN Post Op Check Note  06/18/2019 - 4:50 PM  Brief Clinical History: 34 y.o. POD#0 s/p TLH/BS/cysto (EBL 50 mL) for for painful, heavy periods, chronic pelvic pain.   PMHx significant for DM2 due to h/o partial pancreatectomy, HTN.  Subjective: +ambulation and voiding, took some PO, having periumbilical pain, no nausea/vomiting, scant VB  Objective: Vitals:   06/18/19 1409 06/18/19 1543 06/18/19 1545 06/18/19 1631  BP: 124/90 (!) 130/94  115/83  Pulse: 74 87  93  Resp: 16 14  18   Temp:   98.5 F (36.9 C) 97.7 F (36.5 C)  TempSrc:    Oral  SpO2: 100% 100%  100%  Weight:      Height:         Current Vital Signs 24h Vital Sign Ranges  T 97.7 F (36.5 C) Temp  Avg: 98.1 F (36.7 C)  Min: 97.6 F (36.4 C)  Max: 98.5 F (36.9 C)  BP 115/83 BP  Min: 114/81  Max: 130/94  HR 93 Pulse  Avg: 78.3  Min: 71  Max: 93  RR 18 Resp  Avg: 17.1  Min: 14  Max: 20  SaO2 100 % Room Air SpO2  Avg: 100 %  Min: 100 %  Max: 100 %       24 Hour I/O Current Shift I/O  Time Ins Outs No intake/output data recorded. 04/13 0701 - 04/13 1900 In: 1000 [I.V.:1000] Out: 175 [Urine:125]    General: NAD Abdomen: soft, approp ttp, non distended GU: deferred CV: S1, S2 normal, no murmur, rub or gallop, regular rate and rhythm Pulm: CTAB Extremities: no clubbing, cyanosis or edema; SCDs in place Neuro: A&O x 3  Medications: Current Facility-Administered Medications  Medication Dose Route Frequency Provider Last Rate Last Admin  . fentaNYL (SUBLIMAZE) 100 MCG/2ML injection           . fentaNYL (SUBLIMAZE) 100 MCG/2ML injection           . fentaNYL (SUBLIMAZE) injection 50 mcg  50 mcg Intravenous Q2H PRN Aletha Halim, MD   50 mcg at 06/18/19 1649  . insulin aspart (novoLOG) injection 0-15 Units  0-15 Units Subcutaneous TID WC Latifa Noble, MD      . insulin aspart (novoLOG) injection 0-5 Units  0-5 Units Subcutaneous QHS Aletha Halim, MD      . ketorolac (TORADOL) 30 MG/ML injection            . lactated ringers infusion   Intravenous Continuous Aletha Halim, MD      . lipase/protease/amylase (CREON) capsule 36,000 Units  36,000 Units Oral PRN Minda Ditto, RPH      . lipase/protease/amylase (CREON) capsule 72,000 Units  72,000 Units Oral TID with meals Aletha Halim, MD   72,000 Units at 06/18/19 1648  . ondansetron (ZOFRAN) tablet 4 mg  4 mg Oral Q6H PRN Aletha Halim, MD       Or  . ondansetron (ZOFRAN) injection 4 mg  4 mg Intravenous Q6H PRN Aletha Halim, MD      . oxyCODONE (Oxy IR/ROXICODONE) immediate release tablet 5-10 mg  5-10 mg Oral Q4H PRN Aletha Halim, MD      . pantoprazole (PROTONIX) EC tablet 40 mg  40 mg Oral QPM Aletha Halim, MD   40 mg at 06/18/19 1649  . [START ON 06/19/2019] polyethylene glycol (MIRALAX / GLYCOLAX) packet 17 g  17 g Oral Daily Aletha Halim, MD      . simethicone [facility]) chewable tablet 80 mg  80  mg Oral TID Aletha Halim, MD   80 mg at 06/18/19 1649    Laboratory: Recent Labs  Lab 06/12/19 1034  WBC 4.8  HGB 10.9*  HCT 33.8*  PLT 545*   Recent Labs  Lab 06/12/19 1034  NA 139  K 4.0  CL 107  CO2 25  BUN 9  CREATININE 0.63  CALCIUM 9.0  PROT 7.1  BILITOT 0.4  ALKPHOS 61  ALT 14  AST 20  GLUCOSE 111*    Radiology: none  A/P: pt doing well *GYN: routine care. Follow up final path *HTN: on no meds at home. No issues currently *FEN/GI: DM diet. On SSI achs. Continue home creon, holding home metfromin *PPx: SCDs, OOB with help *Pain: PO and IV PRNs *Dispo: likely home tomorrow after breakfast *Full Code  Durene Romans MD Attending Center for [facility])

## 2019-06-18 NOTE — Anesthesia Procedure Notes (Signed)
Procedure Name: Intubation Date/Time: 06/18/2019 10:23 AM Performed by: Bryson Corona, CRNA Pre-anesthesia Checklist: Patient identified, Emergency Drugs available, Suction available and Patient being monitored Patient Re-evaluated:Patient Re-evaluated prior to induction Oxygen Delivery Method: Circle System Utilized Preoxygenation: Pre-oxygenation with 100% oxygen Induction Type: IV induction Ventilation: Mask ventilation without difficulty Laryngoscope Size: Mac and 3 Grade View: Grade I Tube type: Oral Tube size: 7.0 mm Number of attempts: 1 Airway Equipment and Method: Stylet Placement Confirmation: ETT inserted through vocal cords under direct vision,  positive ETCO2 and breath sounds checked- equal and bilateral Secured at: 23 cm Tube secured with: Tape Dental Injury: Teeth and Oropharynx as per pre-operative assessment

## 2019-06-18 NOTE — Discharge Summary (Signed)
Gynecology Discharge Summary Date of Admission: 06/18/2019 Date of Discharge: 06/19/2019  The patient was admitted, as scheduled, and underwent a TLH/BS/cysto; please refer to operative note for full details.  She was meeting all post op goals and discharged to home on POD#1  Allergies as of 06/19/2019      Reactions   Dilaudid [hydromorphone Hcl] Itching, Other (See Comments)   Reaction:  Hallucinations      Medication List    STOP taking these medications   metroNIDAZOLE 500 MG tablet Commonly known as: FLAGYL     TAKE these medications   Creon 36000 UNITS Cpep capsule Generic drug: lipase/protease/amylase TAKE 2 CAPSULES (72,000 UNITS) BY MOUTH 3 TIMES DAILY WITH MEALS AND 1 CAPSULE WITH SNACK What changed: See the new instructions.   HYDROcodone-acetaminophen 5-325 MG tablet Commonly known as: NORCO/VICODIN Take 1 tablet by mouth every 6 (six) hours as needed for moderate pain. What changed: reasons to take this   ibuprofen 600 MG tablet Commonly known as: ADVIL Take 1 tablet (600 mg total) by mouth every 6 (six) hours as needed.   metFORMIN 500 MG tablet Commonly known as: GLUCOPHAGE Take 1 tablet (500 mg total) by mouth 2 (two) times daily.   ondansetron 4 MG tablet Commonly known as: ZOFRAN Take 1 tablet (4 mg total) by mouth every 8 (eight) hours as needed for nausea or vomiting.   oxyCODONE-acetaminophen 5-325 MG tablet Commonly known as: PERCOCET/ROXICET Take 1-2 tablets by mouth every 6 (six) hours as needed.   pantoprazole 40 MG tablet Commonly known as: PROTONIX Take 1 tablet (40 mg total) by mouth daily. What changed: when to take this   polyethylene glycol 17 g packet Commonly known as: MIRALAX / GLYCOLAX Take 17 g by mouth daily for 14 days.   simethicone 80 MG chewable tablet Commonly known as: MYLICON Chew 1 tablet (80 mg total) by mouth 4 (four) times daily as needed for flatulence.       No future appointments. Request sent for 6wk post  op visit  Loma Boston, DO Attending Center for [employer] manager)

## 2019-06-18 NOTE — Discharge Instructions (Signed)
Laparoscopic Surgery Discharge Instructions  Instructions Following Major Laparoscopic Surgery You have just undergone a major laparoscopic surgery.  The following list should answer your most common questions.  Although we will discuss your surgery and post-operative instructions with you prior to your discharge, this list will serve as a reminder if you fail to recall the details of what we discussed.  We will discuss your surgery once again in detail at your post-op visit in two to four weeks. If you haven't already done so, please call to make your appointment as soon as possible.  How you will feel: Although you have just undergone a major surgery, your recovery will be significantly shorter since the surgery was performed through much smaller incisions than the traditional approach.  You should feel slightly better each day.  If you suddenly feel much worse than the prior day, please call the clinic.  It's important during the early part of your recovery that you maintain some activity.  Walking is encouraged.  You will quicken your recovery by continued activity.  Incision:  Your incisions will be closed with dissolvable stitches or surgical adhesive (glue).  There may be Band-aids and/or Steri-strips covering your incisions.  If there is no drainage from the incisions you may remove the Band-aids in one to two days.  You may notice some minor bruising at the incision sites.  This is common and will resolve within several days.  Please inform us if the redness at the edges of your incision appears to be spreading.  If the skin around your incision becomes warm to the touch, or if you notice a pus-like drainage, please call the office.  Vaginal Discharge Following a Laparoscopic Hysterectomy: Minor vaginal bleeding or spotting is normal following a hysterectomy.  Bleeding similar to the amount of your period is excessive, and you should inform us of this immediately.  Vaginal spotting may continue  for several weeks following your surgery.  You may notice a yellowish discharge which occasionally occurs as the vaginal stitches dissolve, and may last for several weeks.  Sexual Activity Following a Hysterectomy: Do not have sexual intercourse or place tampons or douches in the vagina prior to your first office visit.  We will discuss when you may resume these activities at that visit.    Stairs/Driving/Activities: You may climb stairs if necessary.  If you've had general anesthesia, do not drive a car the rest of the day today.  You may begin light housework when you feel up to it, but avoid heavy lifting (more than 15-20lbs) or pushing until cleared for these activities by your physician.  Hygiene:  Do not soak your incisions.  Showers are acceptable but you may not take a bath or swim in a pool.  Cleanse your incisions daily with soap and water.  Medications:  Please resume taking any medications that you were taking prior to the surgery.  If we have prescribed any new medications for you, please take them as directed.  Constipation:  It is fairly common to experience some difficulty in moving your bowels following major surgery.  Being active will help to reduce this likelihood. A diet rich in fiber and plenty of liquids is desirable.  If you do become constipated, a mild laxative such as Miralax, Milk of Magnesia, or Metamucil, or a stool softener such as Colace, is recommended.  General Instructions: If you develop a fever of 100.5 degrees or higher, please call the office number(s) below for physician on call.   

## 2019-06-18 NOTE — Progress Notes (Signed)
Obstetrics & Gynecology Surgical H&P   Date of Surgery: 06/18/2019    Primary OBGYN: [facility]  Reason for Admission: scheduled hysterectomy  History of Present Illness: [name] is a 34 y.o. [phone] (Patient's last menstrual period was 06/02/2019.), with the above CC. 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  Patient with heavy and painful periods s/p depo provera, ocps and IUD. Patient with likely adenomyosis  ROS: A 12-point review of systems was performed and negative, except as stated in the above HPI.  OBGYN History: As per HPI. OB History  Gravida Para Term Preterm AB Living  7 3 3   4 3   SAB TAB Ectopic Multiple Live Births  3 1   0 3    # Outcome Date GA Lbr Len/2nd Weight Sex Delivery Anes PTL Lv  7 Term 11/05/17 [redacted]w[redacted]d  3190 g F CS-LTranv Spinal  LIV  6 SAB 03/23/16 [redacted]w[redacted]d    SAB     5 Term 04/18/14 [redacted]w[redacted]d  3930 g M CS-Vac Spinal  LIV  4 Term 05/23/12 [redacted]w[redacted]d  4485 g M CS-LTranv Spinal  LIV     Birth Comments: No problems at birth  3 TAB           2 SAB           1 SAB              Past Medical History: Past Medical History:  Diagnosis Date  . Alcohol abuse   . Anemia   . Asthma    rarely uses inhaler, pt. reports she had out grown it  . Bronchitis   . Diabetes mellitus without complication ([place])    partial pancreatectomy 2012-glyburide  . Fibroid   . GERD (gastroesophageal reflux disease)   . Hypertension    during post preg.  no problems now  . Hypertensive retinopathy    OU  . Missed ab    x 2, one requiring D & E  . Pancreatic cyst   . Pancreatitis    diet controlled  . Pelvic inflammatory disease   . Plantar fasciitis   . Pneumonia    h/o of in her teens  . Polycystic ovary syndrome   . 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 Renal cyst    scarring on the kidney  . Sickle cell trait [facility])     Past Surgical History: Past Surgical  History:  Procedure Laterality Date  . CESAREAN SECTION N/A 05/23/2012   Procedure: CESAREAN SECTION;  Surgeon: Lahoma Crocker, MD;  Location: [place] [facility];  Service: Obstetrics;  Laterality: N/A;  primary  . CESAREAN SECTION N/A 04/18/2014   Procedure: REPEAT CESAREAN SECTION;  Surgeon: Shelly Bombard, MD;  Location: [place] [facility];  Service: Obstetrics;  Laterality: N/A;  . CESAREAN SECTION N/A 11/05/2017   Procedure: REPEAT CESAREAN SECTION;  Surgeon: Woodroe Mode, MD;  Location: [place];  Service: Obstetrics;  Laterality: N/A;  . DILATION AND CURETTAGE OF UTERUS      for MAB  . ESOPHAGOGASTRODUODENOSCOPY (EGD) WITH PROPOFOL N/A 05/16/2019   Procedure: ESOPHAGOGASTRODUODENOSCOPY (EGD) WITH PROPOFOL;  Surgeon: Milus Banister, MD;  Location: [facility];  Service: Endoscopy;  Laterality: N/A;  . EUS N/A 06/11/2015   Procedure: UPPER ENDOSCOPIC ULTRASOUND (EUS) RADIAL;  Surgeon: Milus Banister, MD;  Location: [facility];  Service: Endoscopy;  Laterality: N/A;  . EUS N/A 05/16/2019   Procedure:  UPPER ENDOSCOPIC ULTRASOUND (EUS) RADIAL;  Surgeon: Milus Banister, MD;  Location: [facility];  Service: Endoscopy;  Laterality: N/A;  . PANCREAS SURGERY  2012   s/p partial pancreatectomy  . SPLENECTOMY, TOTAL  2012  . TUBAL LIGATION  11/05/2017  . WISDOM TOOTH EXTRACTION      Family History:  Family History  Problem Relation Age of Onset  . Diabetes Mother   . Hypertension Mother   . Asthma Father   . Glaucoma Sister   . Glaucoma Paternal Grandmother   . Colon cancer Neg Hx     Social History:  Social History   Socioeconomic History  . Marital status: Single    Spouse name: Not on file  . Number of children: 2  . Years of education: Not on file  . Highest education level: Not on file  Occupational History  . Occupation: Homemaker  Tobacco Use  . Smoking status: Current Every Day Smoker    Packs/day: 0.25    Years: 13.00    Pack years: 3.25    Types: Cigarettes  .  Smokeless tobacco: Never Used  . Tobacco comment: smokes 4 cigs/day, tobacco info given 04/17/15  Substance and Sexual Activity  . Alcohol use: No    Alcohol/week: 0.0 standard drinks    Comment: social   . Drug use: No  . Sexual activity: Not Currently    Partners: Male    Birth control/protection: None  Other Topics Concern  . Not on file  Social History Narrative  . Not on file   Social Determinants of Health   Financial Resource Strain:   . Difficulty of Paying Living Expenses:   Food Insecurity:   . Worried About Charity fundraiser in the Last Year:   . Arboriculturist in the Last Year:   Transportation Needs:   . Film/video editor (Medical):   Marland Kitchen Lack of Transportation (Non-Medical):   Physical Activity:   . Days of Exercise per Week:   . Minutes of Exercise per Session:   Stress:   . Feeling of Stress :   Social Connections:   . Frequency of Communication with Friends and Family:   . Frequency of Social Gatherings with Friends and Family:   . Attends Religious Services:   . Active Member of Clubs or Organizations:   . Attends Archivist Meetings:   Marland Kitchen Marital Status:   Intimate Partner Violence:   . Fear of Current or Ex-Partner:   . Emotionally Abused:   Marland Kitchen Physically Abused:   . Sexually Abused:      Allergy: Allergies  Allergen Reactions  . Dilaudid [Hydromorphone Hcl] Itching and Other (See Comments)    Reaction:  Hallucinations    Current Outpatient Medications: Medications Prior to Admission  Medication Sig Dispense Refill Last Dose  . CREON 36000 units CPEP capsule TAKE 2 CAPSULES (72,000 UNITS) BY MOUTH 3 TIMES DAILY WITH MEALS AND 1 CAPSULE WITH SNACK (Patient taking differently: Take 36,000-72,000 Units by mouth See admin instructions. Take 72000 units by mouth three times daily with meals and 36000 mg with a snack) 320 capsule 3 06/17/2019 at Unknown time  . HYDROcodone-acetaminophen (NORCO/VICODIN) 5-325 MG tablet Take 1 tablet by  mouth every 6 (six) hours as needed for moderate pain. (Patient taking differently: Take 1 tablet by mouth every 6 (six) hours as needed (pancreatic pain). ) 30 tablet 0 Past Month at Unknown time  . metFORMIN (GLUCOPHAGE) 500 MG tablet Take 1 tablet (500  mg total) by mouth 2 (two) times daily. 60 tablet 5 06/17/2019 at Unknown time  . pantoprazole (PROTONIX) 40 MG tablet Take 1 tablet (40 mg total) by mouth daily. (Patient taking differently: Take 40 mg by mouth every evening. ) 60 tablet 3 06/17/2019 at Unknown time  . metroNIDAZOLE (FLAGYL) 500 MG tablet Take 1 tablet (500 mg total) by mouth 2 (two) times daily. (Patient not taking: Reported on 05/09/2019) 14 tablet 0   . ondansetron (ZOFRAN) 4 MG tablet Take 1 tablet (4 mg total) by mouth every 8 (eight) hours as needed for nausea or vomiting. (Patient not taking: Reported on 03/27/2019) 30 tablet 1      Hospital Medications: Current Facility-Administered Medications  Medication Dose Route Frequency Provider Last Rate Last Admin  . acetaminophen (TYLENOL) tablet 1,000 mg  1,000 mg Oral Once Finucane, Elizabeth M, DO      . ceFAZolin (ANCEF) IVPB 2g/100 mL premix  2 g Intravenous On Call to OR Aletha Halim, MD      . lactated ringers infusion   Intravenous Continuous Aletha Halim, MD      . scopolamine (TRANSDERM-SCOP) 1 MG/3DAYS 1.5 mg  1 patch Transdermal Q72H Pervis Hocking, DO         Physical Exam: Vitals:   06/18/19 0838  BP: 116/84  Pulse: 73  Resp: 18  Temp: 98.5 F (36.9 C)  TempSrc: Oral  SpO2: 100%  Weight: 69 kg  Height: 5' (1.524 m)   General appearance: Well nourished, well developed female in no acute distress.  Cardiovascular: S1, S2 normal, no murmur, rub or gallop, regular rate and rhythm Respiratory:  Clear to auscultation bilateral. Normal respiratory effort Abdomen: positive bowel sounds and no masses, hernias; diffusely non tender to palpation, non distended Neuro/Psych:  Normal mood and affect.   Skin:  Warm and dry.  Extremities: no clubbing, cyanosis, or edema.    Laboratory: UPT: pending COVID: negative CBG: 128 A1c: 6.0 Recent Labs  Lab 06/12/19 1034  WBC 4.8  HGB 10.9*  HCT 33.8*  PLT 545*   Recent Labs  Lab 06/12/19 1034  NA 139  K 4.0  CL 107  CO2 25  BUN 9  CREATININE 0.63  CALCIUM 9.0  PROT 7.1  BILITOT 0.4  ALKPHOS 61  ALT 14  AST 20  GLUCOSE 111*   No results for input(s): APTT, INR, PTT in the last 168 hours.  Invalid input(s): DRHAPTT Recent Labs  Lab 06/12/19 1045  [place] Performed at [facility], [place] [address] [place], [place] [address]   B POS    Imaging:  EXAM: TRANSABDOMINAL AND TRANSVAGINAL ULTRASOUND OF PELVIS  TECHNIQUE: Both transabdominal and transvaginal ultrasound examinations of the pelvis were performed. Transabdominal technique was performed for global imaging of the pelvis including uterus, ovaries, adnexal regions, and pelvic cul-de-sac. It was necessary to proceed with endovaginal exam following the transabdominal exam to visualize the endometrium.  COMPARISON:  None  FINDINGS: Uterus  Measurements: 9.6 x 5.8 x 6.2 cm = volume: 179.5 mL. There is a possible 1.2 x 0.8 x 0.9 cm intramural fibroid within the anterior right uterine fundus.  Endometrium  Thickness: 8 mm. No focal abnormality visualized. Small amount of peripheral calcifications.  Right ovary  Measurements: 4.1 x 2.5 x 3.3 cm = volume: 17.7 mL. Normal appearance/no adnexal mass.  Left ovary  Measurements: 4.1 x 1.8 x 3.0 cm = volume: 11.7 mL. Normal appearance/no adnexal mass.  Other findings  No  abnormal free fluid.  IMPRESSION: Endometrium measures 8 mm. If bleeding remains unresponsive to hormonal or medical therapy, sonohysterogram should be considered for focal lesion work-up. (Ref: Radiological Reasoning: Algorithmic Workup of Abnormal Vaginal Bleeding with Endovaginal Sonography  and Sonohysterography. AJR [id number])  Probable small fibroid.   Electronically Signed   By: Lovey Newcomer M.D.   On: 12/03/2018 16:52  CLINICAL DATA:  Persistent abdominal pain. History of pancreatitis. Nausea. History of splenectomy and partial pancreatectomy.  EXAM: CT ABDOMEN AND PELVIS WITH CONTRAST  TECHNIQUE: Multidetector CT imaging of the abdomen and pelvis was performed using the standard protocol following bolus administration of intravenous contrast.  CONTRAST:  167mL OMNIPAQUE IOHEXOL 300 MG/ML  SOLN  COMPARISON:  12/19/2017 abdominal MRI.  Most recent CT 08/22/2012  FINDINGS: Lower chest: Clear lung bases. Normal heart size without pericardial or pleural effusion.  Hepatobiliary: Variant lateral segment left liver lobe extending in the left upper quadrant. Normal gallbladder, without biliary ductal dilatation.  Pancreas: Partial pancreatectomy involving the body and tail. The pancreatic head and uncinate process or normal.  Spleen: Surgically absent  Adrenals/Urinary Tract: Normal adrenal glands. Mildly scarred upper pole right kidney. Normal left kidney, without hydronephrosis. Normal urinary bladder.  Stomach/Bowel: Proximal gastric underdistention. Apparent borderline wall thickening is felt to be secondary, including on 12/02. Appearance is relatively similar to on the remote prior CT. There is also underdistention of the gastric antrum with apparent mild wall thickening, including on 17/2.  Colonic stool burden suggests constipation. Normal terminal ileum and appendix. Normal small bowel.  Vascular/Lymphatic: Normal caliber of the aorta and branch vessels. No abdominopelvic adenopathy.  Reproductive: Mildly globular heterogeneous uterus. Heterogeneous enhancement within the pelvic cul-de-sac srepresent a corpus luteal cyst, arising from the left ovary including at 1.7 x 1.5 cm on 61/2.  Other: Trace free pelvic fluid  is likely physiologic. No free intraperitoneal air.  Musculoskeletal: No acute osseous abnormality.  IMPRESSION: 1. Partial pancreatectomy, without evidence of acute pancreatitis. 2.  Possible constipation. 3. Areas of gastric wall borderline and mild thickening are at least partially felt to be due to underdistention. Cannot exclude gastritis. 4. Left ovarian corpus luteal cyst. 5. Globular, enlarged uterus as can be seen with multiple small fibroids or uterine adenomyosis.   Electronically Signed   By: Abigail Miyamoto M.D.   On: 03/22/2019 14:16  Assessment: [name] is a 34 y.o. [id number] (Patient's last menstrual period was 06/02/2019.) here for scheduled sugery Plan: D/w pt and she is amenable with proceeding. Pt consented forTLH/BS/cystoscopy. Can proceed when OR is ready  Durene Romans MD Attending Center for [place] (Faculty Practice) [phone]

## 2019-06-18 NOTE — Anesthesia Postprocedure Evaluation (Signed)
Anesthesia Post Note  Patient: [name]  Procedure(s) Performed: TOTAL LAPAROSCOPIC HYSTERECTOMY WITH SALPINGECTOMY (Bilateral Abdomen) CYSTOSCOPY (N/A Bladder)     Patient location during evaluation: PACU Anesthesia Type: General Level of consciousness: awake and alert, oriented and patient cooperative Pain management: pain level controlled Vital Signs Assessment: post-procedure vital signs reviewed and stable Respiratory status: spontaneous breathing, nonlabored ventilation and respiratory function stable Cardiovascular status: blood pressure returned to baseline and stable Postop Assessment: no apparent nausea or vomiting Anesthetic complications: no    Last Vitals:  Vitals:   06/18/19 1312 06/18/19 1324  BP: 114/81 123/79  Pulse: 77 79  Resp: 18 20  Temp: 36.4 C   SpO2: 100% 100%    Last Pain:  Vitals:   06/18/19 1324  TempSrc:   PainSc: [place]

## 2019-06-18 NOTE — Brief Op Note (Signed)
06/18/2019  12:55 PM  PATIENT:  [name]  34 y.o. female  PRE-OPERATIVE DIAGNOSIS:  Pelvic Pain Heavy Periods  POST-OPERATIVE DIAGNOSIS:  pelvic pain heavy periods  PROCEDURE:  Total laparoscopic hysterectomy, bilateral salpingectomy, cystoscopy  SURGEON:  Surgeon(s) and Role:    * Aletha Halim, MD - Primary  ASSISTANTS:    * Constant, Peggy, MD - Assisting  ANESTHESIA:   local and general  EBL:  50 mL   BLOOD ADMINISTERED:none  DRAINS: indwelling foley 117mL UOP   LOCAL MEDICATIONS USED:  MARCAINE     SPECIMEN: cervix, uterus, tubes  DISPOSITION OF SPECIMEN:  PATHOLOGY  COUNTS:  YES  TOURNIQUET:  * No tourniquets in log *  DICTATION: .Note written in EPIC  PLAN OF CARE: Admit for overnight observation  PATIENT DISPOSITION:  PACU - hemodynamically stable.   Delay start of Pharmacological VTE agent (>24hrs) due to surgical blood loss or risk of bleeding: not applicable  Durene Romans MD Attending Center for [facility] (Faculty Practice) 06/18/2019 Time: 1300

## 2019-06-18 NOTE — Transfer of Care (Signed)
Immediate Anesthesia Transfer of Care Note  Patient: Anahi Y Buch  Procedure(s) Performed: TOTAL LAPAROSCOPIC HYSTERECTOMY WITH SALPINGECTOMY (Bilateral Abdomen) CYSTOSCOPY (N/A Bladder)  Patient Location: PACU  Anesthesia Type:General  Level of Consciousness: drowsy and patient cooperative  Airway & Oxygen Therapy: Patient Spontanous Breathing and Patient connected to face mask oxygen  Post-op Assessment: Report given to RN and Post -op Vital signs reviewed and stable  Post vital signs: Reviewed and stable  Last Vitals:  Vitals Value Taken Time  BP 114/81   Temp    Pulse 102   Resp 16   SpO2 100%     Last Pain:  Vitals:   06/18/19 0859  TempSrc:   PainSc: 1       Patients Stated Pain Goal: 3 ([id number])  Complications: No apparent anesthesia complications

## 2019-06-18 NOTE — Progress Notes (Signed)
Patient arrived to room 6N26 from PACU in stable fashion. Patient is alert and oriented times 4. C/O pain 6/10 at present. Patient able to ambulate to the bathroom to empty bladder. Belongings and call bell within reach. NAD. Bed is in the lowest and locked position with bed rails up times 2.

## 2019-06-19 DIAGNOSIS — N72 Inflammatory disease of cervix uteri: Secondary | ICD-10-CM | POA: Diagnosis not present

## 2019-06-19 DIAGNOSIS — N800 Endometriosis of uterus: Secondary | ICD-10-CM | POA: Diagnosis not present

## 2019-06-19 DIAGNOSIS — N946 Dysmenorrhea, unspecified: Secondary | ICD-10-CM | POA: Diagnosis not present

## 2019-06-19 DIAGNOSIS — N920 Excessive and frequent menstruation with regular cycle: Secondary | ICD-10-CM | POA: Diagnosis not present

## 2019-06-19 LAB — SURGICAL PATHOLOGY

## 2019-06-19 LAB — GLUCOSE, CAPILLARY: Glucose-Capillary: 116 mg/dL — ABNORMAL HIGH (ref 70–99)

## 2019-06-19 MED ORDER — POLYETHYLENE GLYCOL 3350 17 G PO PACK
17.0000 g | PACK | Freq: Every day | ORAL | Status: DC
  Administered 2019-06-19: 09:00:00 17 g via ORAL
  Filled 2019-06-19: qty 1

## 2019-06-19 MED ORDER — METFORMIN HCL 500 MG PO TABS
500.0000 mg | ORAL_TABLET | Freq: Two times a day (BID) | ORAL | Status: DC
  Administered 2019-06-19: 500 mg via ORAL
  Filled 2019-06-19: qty 1

## 2019-06-21 NOTE — Op Note (Signed)
Operative Note   06/21/2019  PRE-OP DIAGNOSIS Menorrhagia Dysmenorrhea ?Adenomyosis   POST-OP DIAGNOSIS *Same   SURGEON: Surgeon(s) and Role:    * Aletha Halim, MD - Primary  ASSISTANT:    Elly Modena, Peggy, MD - Assisting  An experienced assistant was required given the standard of surgical care given the complexity of the case. This assistant was needed for exposure, dissection, suctioning, retraction, instrument exchange, and for overall help during the procedure.   PROCEDURE: Total laparoscopic hysterectomy, bilateral salpingectomy, cystoscopy  ANESTHESIA: General and local  ESTIMATED BLOOD LOSS: 39mL  DRAINS: indwelling foley 135mL UOP   TOTAL IV FLUIDS: per anesthesia note  VTE PROPHYLAXIS: SCDs to the bilateral lower extremities  ANTIBIOTICS: Two grams of Cefazolin were given. within 1 hour of skin incision  SPECIMENS: cervix, uterus, bilateral fallopian tubes  DISPOSITION: PACU - hemodynamically stable.  CONDITION: stable  COMPLICATIONS: None  FINDINGS: Normal EGBUS, vagina, cervix. On laparoscopy, a few adhesions at the bladder that were easily taken down, no intra-abdominal adhesions noted. Normal liver, stomach edge; normal ovaries and tubes with filshie clips at their mid points. Grossly normal uterus that appeared and felt globular and enlarged.  Normal bladder dome and bilateral ureteral orifices identified with +jets of yellow urine and dye.  DESCRIPTION OF PROCEDURE: After informed consent was obtained, the patient was taken to the operating room where anesthesia was obtained without difficulty. The patient was positioned in the dorsal lithotomy position in [place] and her arms were carefully tucked at her sides and the usual precautions were taken.  She was prepped and draped in normal sterile fashion.  Time-out was performed and a Foley catheter was placed into the bladder. A Large VCare uterine manipulator was then placed in the uterus without  incident. Gloves were then changed, and after injection of local anesthesia, the open technique was used to place an infraumbilical [id number] baloon trocar under direct visualization. The laparoscope was introduced and CO2 gas was infused for pneumoperitoneum to a pressure of 15 mm Hg and the area below inspected for injury.  The patient was placed in Trendelenburg and the bowel was displaced up into the upper abdomen, and the right and left lateral 5-mm ports and an 11 mm suprapubic port were placed under direct visualization of the laparoscope, after injection of local anesthesia.    The bilateral cornua were then cauterized with the Ligasure device, and the round ligaments were divided on each side with the monopolar scissors and the retroperitoneal space was opened bilaterally.  A bladder flap was created and the bladder was dissected down off the lower uterine segment and cervix using endoshears and electrocautery.  The broad ligaments were then opened, and the uterine arteries were skeletonized bilaterally, sealed and divided with the Ligasure device.  A colpotomy was performed circumferentially along the ring with electrocautery and the cervix was incised from the vagina and the specimen was removed through the vagina.  A pneumo balloon was placed in the vagina and the vaginal cuff was then closed in a running continuous fashion using the EndoStitch technique with 2-0 V-Lock suture with careful attention to include the vaginal cuff angles and the vaginal mucosa within the closure.  IV Sodium Fluorescein was then given. The bilateral tubes were then cauterized, transected and separated from the ovaries, with the Ligasure device and removed; the cystoscopy was then performed, with the above noted findings. The intraperitoneal pressure was dropped, and all planes of dissection, vascular pedicles and the vaginal cuff were found  to be hemostatic.    The suprapubic trocar was removed and the fascia was closed with 0  vicryl suture using the Endostitch technique. The lateral trocars were removed under visualization.   Before the umbilical trocar was removed the CO2 gas was released.  The fascia there was closed with 0 vicryl suture in a figure of eight fashion.   The skin incision at the umbilicus was closed with a subcuticular stitch of 4-0 monocryl.  The remaining skin incisions were closed with Indermil glue.  The patient tolerated the procedure well.  Sponge, lap and needle counts were correct x2.  The patient was taken to recovery room in excellent condition.  Durene Romans MD Attending Center for [employer])

## 2019-06-21 NOTE — H&P (Signed)
Obstetrics & Gynecology Surgical H&P   Date of Surgery: 06/21/2019    Primary OBGYN: [facility]  Reason for Admission: scheduled hysterectomy  History of Present Illness: [name] is a 34 y.o. [phone] (Patient's last menstrual period was 06/02/2019.), with the above CC. 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  Patient with heavy and painful periods s/p depo provera, ocps and IUD. Patient with likely adenomyosis  ROS: A 12-point review of systems was performed and negative, except as stated in the above HPI.  OBGYN History: As per HPI. OB History  Gravida Para Term Preterm AB Living  7 3 3   4 3   SAB TAB Ectopic Multiple Live Births  3 1   0 3    # Outcome Date GA Lbr Len/2nd Weight Sex Delivery Anes PTL Lv  7 Term 11/05/17 [redacted]w[redacted]d  3190 g F CS-LTranv Spinal  LIV  6 SAB 03/23/16 [redacted]w[redacted]d    SAB     5 Term 04/18/14 [redacted]w[redacted]d  3930 g M CS-Vac Spinal  LIV  4 Term 05/23/12 [redacted]w[redacted]d  4485 g M CS-LTranv Spinal  LIV     Birth Comments: No problems at birth  3 TAB           2 SAB           1 SAB              Past Medical History: Past Medical History:  Diagnosis Date  . Alcohol abuse   . Anemia   . Asthma    rarely uses inhaler, pt. reports she had out grown it  . Bronchitis   . Diabetes mellitus without complication ([place])    partial pancreatectomy 2012-glyburide  . Fibroid   . GERD (gastroesophageal reflux disease)   . Hypertension    during post preg.  no problems now  . Hypertensive retinopathy    OU  . Missed ab    x 2, one requiring D & E  . Pancreatic cyst   . Pancreatitis    diet controlled  . Pelvic inflammatory disease   . Plantar fasciitis   . Pneumonia    h/o of in her teens  . Polycystic ovary syndrome   . 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 Renal cyst    scarring on the kidney  . Sickle cell trait [facility])     Past Surgical History: Past Surgical  History:  Procedure Laterality Date  . CESAREAN SECTION N/A 05/23/2012   Procedure: CESAREAN SECTION;  Surgeon: Lahoma Crocker, MD;  Location: [place] [facility];  Service: Obstetrics;  Laterality: N/A;  primary  . CESAREAN SECTION N/A 04/18/2014   Procedure: REPEAT CESAREAN SECTION;  Surgeon: Shelly Bombard, MD;  Location: [place] [facility];  Service: Obstetrics;  Laterality: N/A;  . CESAREAN SECTION N/A 11/05/2017   Procedure: REPEAT CESAREAN SECTION;  Surgeon: Woodroe Mode, MD;  Location: [place];  Service: Obstetrics;  Laterality: N/A;  . CYSTOSCOPY N/A 06/18/2019   Procedure: CYSTOSCOPY;  Surgeon: Aletha Halim, MD;  Location: [place];  Service: Gynecology;  Laterality: N/A;  . DILATION AND CURETTAGE OF UTERUS      for MAB  . ESOPHAGOGASTRODUODENOSCOPY (EGD) WITH PROPOFOL N/A 05/16/2019   Procedure: ESOPHAGOGASTRODUODENOSCOPY (EGD) WITH PROPOFOL;  Surgeon: Milus Banister, MD;  Location: [facility];  Service: Endoscopy;  Laterality: N/A;  . EUS N/A 06/11/2015   Procedure: UPPER ENDOSCOPIC ULTRASOUND (EUS) RADIAL;  Surgeon: Milus Banister, MD;  Location: [facility];  Service: Endoscopy;  Laterality: N/A;  . EUS N/A 05/16/2019   Procedure: UPPER ENDOSCOPIC ULTRASOUND (EUS) RADIAL;  Surgeon: Milus Banister, MD;  Location: [facility];  Service: Endoscopy;  Laterality: N/A;  . PANCREAS SURGERY  2012   s/p partial pancreatectomy  . SPLENECTOMY, TOTAL  2012  . TOTAL LAPAROSCOPIC HYSTERECTOMY WITH SALPINGECTOMY Bilateral 06/18/2019   Procedure: TOTAL LAPAROSCOPIC HYSTERECTOMY WITH SALPINGECTOMY;  Surgeon: Aletha Halim, MD;  Location: [place];  Service: Gynecology;  Laterality: Bilateral;  . TUBAL LIGATION  11/05/2017  . WISDOM TOOTH EXTRACTION      Family History:  Family History  Problem Relation Age of Onset  . Diabetes Mother   . Hypertension Mother   . Asthma Father   . Glaucoma Sister   . Glaucoma Paternal Grandmother   . Colon cancer Neg Hx     Social History:  Social  History   Socioeconomic History  . Marital status: Single    Spouse name: Not on file  . Number of children: 2  . Years of education: Not on file  . Highest education level: Not on file  Occupational History  . Occupation: Homemaker  Tobacco Use  . Smoking status: Current Every Day Smoker    Packs/day: 0.25    Years: 13.00    Pack years: 3.25    Types: Cigarettes  . Smokeless tobacco: Never Used  . Tobacco comment: smokes 4 cigs/day, tobacco info given 04/17/15  Substance and Sexual Activity  . Alcohol use: No    Alcohol/week: 0.0 standard drinks    Comment: social   . Drug use: No  . Sexual activity: Not Currently    Partners: Male    Birth control/protection: None  Other Topics Concern  . Not on file  Social History Narrative  . Not on file   Social Determinants of Health   Financial Resource Strain:   . Difficulty of Paying Living Expenses:   Food Insecurity:   . Worried About Charity fundraiser in the Last Year:   . Arboriculturist in the Last Year:   Transportation Needs:   . Film/video editor (Medical):   Marland Kitchen Lack of Transportation (Non-Medical):   Physical Activity:   . Days of Exercise per Week:   . Minutes of Exercise per Session:   Stress:   . Feeling of Stress :   Social Connections:   . Frequency of Communication with Friends and Family:   . Frequency of Social Gatherings with Friends and Family:   . Attends Religious Services:   . Active Member of Clubs or Organizations:   . Attends Archivist Meetings:   Marland Kitchen Marital Status:   Intimate Partner Violence:   . Fear of Current or Ex-Partner:   . Emotionally Abused:   Marland Kitchen Physically Abused:   . Sexually Abused:      Allergy: Allergies  Allergen Reactions  . Dilaudid [Hydromorphone Hcl] Itching and Other (See Comments)    Reaction:  Hallucinations    Current Outpatient Medications: No medications prior to admission.     Hospital Medications: No current facility-administered  medications for this encounter.   Current Outpatient Medications  Medication Sig Dispense Refill  . CREON 36000 units CPEP capsule TAKE 2 CAPSULES (72,000 UNITS) BY MOUTH 3 TIMES DAILY WITH MEALS AND 1 CAPSULE WITH SNACK (Patient taking differently: Take 36,000-72,000 Units by mouth See admin instructions. Take 72000 units by mouth three times daily  with meals and 36000 mg with a snack) 320 capsule 3  . HYDROcodone-acetaminophen (NORCO/VICODIN) 5-325 MG tablet Take 1 tablet by mouth every 6 (six) hours as needed for moderate pain. (Patient taking differently: Take 1 tablet by mouth every 6 (six) hours as needed (pancreatic pain). ) 30 tablet 0  . metFORMIN (GLUCOPHAGE) 500 MG tablet Take 1 tablet (500 mg total) by mouth 2 (two) times daily. 60 tablet 5  . pantoprazole (PROTONIX) 40 MG tablet Take 1 tablet (40 mg total) by mouth daily. (Patient taking differently: Take 40 mg by mouth every evening. ) 60 tablet 3  . ibuprofen (ADVIL) 600 MG tablet Take 1 tablet (600 mg total) by mouth every 6 (six) hours as needed. 30 tablet 0  . ondansetron (ZOFRAN) 4 MG tablet Take 1 tablet (4 mg total) by mouth every 8 (eight) hours as needed for nausea or vomiting. (Patient not taking: Reported on 03/27/2019) 30 tablet 1  . oxyCODONE-acetaminophen (PERCOCET/ROXICET) 5-325 MG tablet Take 1-2 tablets by mouth every 6 (six) hours as needed. 30 tablet 0  . polyethylene glycol (MIRALAX / GLYCOLAX) 17 g packet Take 17 g by mouth daily for 14 days. 30 each 0  . simethicone (MYLICON) 80 MG chewable tablet Chew 1 tablet (80 mg total) by mouth 4 (four) times daily as needed for flatulence. 30 tablet 0     Physical Exam: Vitals:   06/18/19 2129 06/19/19 0132 06/19/19 0504 06/19/19 1035  BP: 109/77 108/76 103/75 109/74  Pulse: 74 72 70 67  Resp: 20 16 17 18   Temp: 99.8 F (37.7 C) 98.6 F (37 C) 98.5 F (36.9 C) 98.6 F (37 C)  TempSrc: Oral Oral Oral Oral  SpO2: 100% 99% 100% 100%  Weight:      Height:        General appearance: Well nourished, well developed female in no acute distress.  Cardiovascular: S1, S2 normal, no murmur, rub or gallop, regular rate and rhythm Respiratory:  Clear to auscultation bilateral. Normal respiratory effort Abdomen: positive bowel sounds and no masses, hernias; diffusely non tender to palpation, non distended Neuro/Psych:  Normal mood and affect.  Skin:  Warm and dry.  Extremities: no clubbing, cyanosis, or edema.    Laboratory: UPT: pending COVID: negative CBG: 128 A1c: 6.0 No results for input(s): WBC, HGB, HCT, PLT in the last 168 hours. No results for input(s): NA, K, CL, CO2, BUN, CREATININE, CALCIUM, PROT, BILITOT, ALKPHOS, ALT, AST, GLUCOSE in the last 168 hours.  Invalid input(s): LABALBU No results for input(s): APTT, INR, PTT in the last 168 hours.  Invalid input(s): DRHAPTT No results for input(s): ABORH in the last 168 hours.  Imaging:  EXAM: TRANSABDOMINAL AND TRANSVAGINAL ULTRASOUND OF PELVIS  TECHNIQUE: Both transabdominal and transvaginal ultrasound examinations of the pelvis were performed. Transabdominal technique was performed for global imaging of the pelvis including uterus, ovaries, adnexal regions, and pelvic cul-de-sac. It was necessary to proceed with endovaginal exam following the transabdominal exam to visualize the endometrium.  COMPARISON:  None  FINDINGS: Uterus  Measurements: 9.6 x 5.8 x 6.2 cm = volume: 179.5 mL. There is a possible 1.2 x 0.8 x 0.9 cm intramural fibroid within the anterior right uterine fundus.  Endometrium  Thickness: 8 mm. No focal abnormality visualized. Small amount of peripheral calcifications.  Right ovary  Measurements: 4.1 x 2.5 x 3.3 cm = volume: 17.7 mL. Normal appearance/no adnexal mass.  Left ovary  Measurements: 4.1 x 1.8 x 3.0 cm = volume: 11.7 mL.  Normal appearance/no adnexal mass.  Other findings  No abnormal free fluid.  IMPRESSION: Endometrium  measures 8 mm. If bleeding remains unresponsive to hormonal or medical therapy, sonohysterogram should be considered for focal lesion work-up. (Ref: Radiological Reasoning: Algorithmic Workup of Abnormal Vaginal Bleeding with Endovaginal Sonography and Sonohysterography. [id number] [id number])  Probable small fibroid.   Electronically Signed   By: Lovey Newcomer M.D.   On: 12/03/2018 16:52  CLINICAL DATA:  Persistent abdominal pain. History of pancreatitis. Nausea. History of splenectomy and partial pancreatectomy.  EXAM: CT ABDOMEN AND PELVIS WITH CONTRAST  TECHNIQUE: Multidetector CT imaging of the abdomen and pelvis was performed using the standard protocol following bolus administration of intravenous contrast.  CONTRAST:  168mL OMNIPAQUE IOHEXOL 300 MG/ML  SOLN  COMPARISON:  12/19/2017 abdominal MRI.  Most recent CT 08/22/2012  FINDINGS: Lower chest: Clear lung bases. Normal heart size without pericardial or pleural effusion.  Hepatobiliary: Variant lateral segment left liver lobe extending in the left upper quadrant. Normal gallbladder, without biliary ductal dilatation.  Pancreas: Partial pancreatectomy involving the body and tail. The pancreatic head and uncinate process or normal.  Spleen: Surgically absent  Adrenals/Urinary Tract: Normal adrenal glands. Mildly scarred upper pole right kidney. Normal left kidney, without hydronephrosis. Normal urinary bladder.  Stomach/Bowel: Proximal gastric underdistention. Apparent borderline wall thickening is felt to be secondary, including on 12/02. Appearance is relatively similar to on the remote prior CT. There is also underdistention of the gastric antrum with apparent mild wall thickening, including on 17/2.  Colonic stool burden suggests constipation. Normal terminal ileum and appendix. Normal small bowel.  Vascular/Lymphatic: Normal caliber of the aorta and branch vessels. No abdominopelvic  adenopathy.  Reproductive: Mildly globular heterogeneous uterus. Heterogeneous enhancement within the pelvic cul-de-sac srepresent a corpus luteal cyst, arising from the left ovary including at 1.7 x 1.5 cm on 61/2.  Other: Trace free pelvic fluid is likely physiologic. No free intraperitoneal air.  Musculoskeletal: No acute osseous abnormality.  IMPRESSION: 1. Partial pancreatectomy, without evidence of acute pancreatitis. 2.  Possible constipation. 3. Areas of gastric wall borderline and mild thickening are at least partially felt to be due to underdistention. Cannot exclude gastritis. 4. Left ovarian corpus luteal cyst. 5. Globular, enlarged uterus as can be seen with multiple small fibroids or uterine adenomyosis.   Electronically Signed   By: Abigail Miyamoto M.D.   On: 03/22/2019 14:16  Assessment: [name] is a 34 y.o. [id number] (Patient's last menstrual period was 06/02/2019.) here for scheduled sugery Plan: D/w pt and she is amenable with proceeding. Pt consented forTLH/BS/cystoscopy. Can proceed when OR is ready  Durene Romans MD Attending Center for [place] (Faculty Practice) [phone]

## 2019-08-07 ENCOUNTER — Ambulatory Visit: Payer: Medicaid Other | Admitting: Obstetrics & Gynecology

## 2019-08-07 ENCOUNTER — Other Ambulatory Visit: Payer: Self-pay

## 2019-08-14 ENCOUNTER — Encounter: Payer: Self-pay | Admitting: Obstetrics and Gynecology

## 2019-08-14 ENCOUNTER — Ambulatory Visit (INDEPENDENT_AMBULATORY_CARE_PROVIDER_SITE_OTHER): Payer: Medicaid Other | Admitting: Obstetrics and Gynecology

## 2019-08-14 ENCOUNTER — Other Ambulatory Visit: Payer: Self-pay

## 2019-08-14 VITALS — BP 117/83 | HR 81 | Ht 60.0 in | Wt 152.2 lb

## 2019-08-14 DIAGNOSIS — Z09 Encounter for follow-up examination after completed treatment for conditions other than malignant neoplasm: Secondary | ICD-10-CM

## 2019-08-14 DIAGNOSIS — Z48816 Encounter for surgical aftercare following surgery on the genitourinary system: Secondary | ICD-10-CM

## 2019-08-14 DIAGNOSIS — N760 Acute vaginitis: Secondary | ICD-10-CM

## 2019-08-14 DIAGNOSIS — B9689 Other specified bacterial agents as the cause of diseases classified elsewhere: Secondary | ICD-10-CM

## 2019-08-14 DIAGNOSIS — B3731 Acute candidiasis of vulva and vagina: Secondary | ICD-10-CM

## 2019-08-14 DIAGNOSIS — B373 Candidiasis of vulva and vagina: Secondary | ICD-10-CM

## 2019-08-14 MED ORDER — FLUCONAZOLE 150 MG PO TABS: 150.0000 mg | ORAL_TABLET | ORAL | 0 refills | Status: AC

## 2019-08-14 MED ORDER — METRONIDAZOLE 500 MG PO TABS: 500.0000 mg | ORAL_TABLET | Freq: Two times a day (BID) | ORAL | 0 refills | Status: AC

## 2019-08-14 NOTE — Progress Notes (Signed)
Pt states may have a yeast infection, d/c with itching.

## 2019-08-14 NOTE — Progress Notes (Signed)
Obstetrics and Gynecology Visit Return Patient Evaluation  Appointment Date: 08/14/2019  Primary Care Provider: [place], [place] for [facility]  Chief Complaint: post op  History of Present Illness:  [name] is a 34 y.o. s/p TLH/BS/cysto on 4/14 for heavy, painful periods.  Patient discharged pod#1. Pt having some itching and discharge. No pain, bleeding  Review of Systems:  as noted in the History of Present Illness.  Medications:  Dollye Y. Kubota had no medications administered during this visit. Current Outpatient Medications  Medication Sig Dispense Refill  . CREON 36000 units CPEP capsule TAKE 2 CAPSULES (72,000 UNITS) BY MOUTH 3 TIMES DAILY WITH MEALS AND 1 CAPSULE WITH SNACK (Patient taking differently: Take 36,000-72,000 Units by mouth See admin instructions. Take 72000 units by mouth three times daily with meals and 36000 mg with a snack) 320 capsule 3  . HYDROcodone-acetaminophen (NORCO/VICODIN) 5-325 MG tablet Take 1 tablet by mouth every 6 (six) hours as needed for moderate pain. (Patient taking differently: Take 1 tablet by mouth every 6 (six) hours as needed (pancreatic pain). ) 30 tablet 0  . metFORMIN (GLUCOPHAGE) 500 MG tablet Take 1 tablet (500 mg total) by mouth 2 (two) times daily. 60 tablet 5  . pantoprazole (PROTONIX) 40 MG tablet Take 1 tablet (40 mg total) by mouth daily. (Patient taking differently: Take 40 mg by mouth every evening. ) 60 tablet 3  . fluconazole (DIFLUCAN) 150 MG tablet Take 1 tablet (150 mg total) by mouth every 3 (three) days for 2 doses. 2 tablet 0  . ibuprofen (ADVIL) 600 MG tablet Take 1 tablet (600 mg total) by mouth every 6 (six) hours as needed. (Patient not taking: Reported on 08/14/2019) 30 tablet 0  . metroNIDAZOLE (FLAGYL) 500 MG tablet Take 1 tablet (500 mg total) by mouth 2 (two) times daily for 7 days. 14 tablet 0  . ondansetron (ZOFRAN) 4 MG tablet Take 1 tablet (4 mg total) by mouth  every 8 (eight) hours as needed for nausea or vomiting. (Patient not taking: Reported on 03/27/2019) 30 tablet 1  . oxyCODONE-acetaminophen (PERCOCET/ROXICET) 5-325 MG tablet Take 1-2 tablets by mouth every 6 (six) hours as needed. (Patient not taking: Reported on 08/14/2019) 30 tablet 0  . simethicone (MYLICON) 80 MG chewable tablet Chew 1 tablet (80 mg total) by mouth 4 (four) times daily as needed for flatulence. (Patient not taking: Reported on 08/14/2019) 30 tablet 0   No current facility-administered medications for this visit.    Allergies: is allergic to dilaudid [hydromorphone hcl].  Physical Exam:  BP 117/83   Pulse 81   Ht 5' (1.524 m)   Wt 152 lb 3.2 oz (69 kg)   LMP 06/02/2019   BMI 29.72 kg/m  Body mass index is 29.72 kg/m. General appearance: Well nourished, well developed female in no acute distress.  Abdomen: diffusely non tender to palpation, non distended, and no masses, hernias. Well healed l/s port sites Neuro/Psych:  Normal mood and affect.    Pelvic exam:  EGBUS, vaginal vault:  +white d/c, cottage cheese like d/c. Cuff normal, nttp  Assessment: pt doing well  Plan:  1. Vulvovaginal candidiasis Diflucan  2. BV (bacterial vaginosis) flagyl  3. Postop check Doing well. No issues   RTC: PRN  Durene Romans MD Attending Center for [employer] [facility])

## 2019-08-19 ENCOUNTER — Ambulatory Visit: Payer: Medicaid Other | Admitting: Internal Medicine

## 2019-08-19 VITALS — BP 130/89 | HR 100 | Temp 98.0°F | Wt 155.7 lb

## 2019-08-19 DIAGNOSIS — K861 Other chronic pancreatitis: Secondary | ICD-10-CM

## 2019-08-19 DIAGNOSIS — N644 Mastodynia: Secondary | ICD-10-CM | POA: Insufficient documentation

## 2019-08-19 NOTE — Assessment & Plan Note (Signed)
She reports that it started 1 month ago, located under her left underarm, no radiation, throbbing sensation, worse with pressure, has not tried anything to help the pain. Has a history of gastritis and did not tolerate naproxen before. Feels like the pain is worsening. She denies any fevers, chills, nausea, vomiting, nipple discharge, rash, or other abnormalities. Noted and occasional dry cough. She reports occasional night sweats. 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 She also reports a family history of breast cancer, reports history in her 31 and maternal side of the family, also reports family history of ovarian cancer.   On exam she is well appearing, NAD, TTP in the left axilla RUQ area, no masses, no rash, no skin changes, no nipple discharge, no lymphadenopathy present.   No findings suggestive of mastitis on exam, no fever, redness, or swelling. No palpable mass, skin changes, or discharge.  Given her history of likely adenomyosis requiring TLH and family history of breast cancer will obtain imaging to evaluate her breast pain. This could be related to her recent hysterectomy and hormonal imbalances.  If imaging is negative will contact patient to provide reassurance.  -Advised to use warm compresses and acetaminophen for pain relief.  Also advised to use physical support. -BL mammogram, left breast and axilla ultrasound

## 2019-08-19 NOTE — Patient Instructions (Addendum)
[name],  It was a pleasure to see you today. Thank you for coming in.   Today we discussed your breast pain. In regards to this please start using warm compresses for the pain, you can also use tylenol to help with the pain. I have ordered some imaging to further evaluate this.   Please return to clinic in 3 months or sooner if needed.   Thank you again for coming in.   [name]

## 2019-08-19 NOTE — Progress Notes (Signed)
CC: Left breast pain  HPI:  [name] is a 34 y.o. the history listed below including chronic pancreatitis and diabetes who is presenting with complaints of left breast pain x1 month.  She reports that it started 1 month ago, located under her left underarm, no radiation, throbbing sensation, worse with pressure, has not tried anything to help the pain. Has a history of gastritis and did not tolerate naproxen before. Feels like the pain is worsening. She denies any fevers, chills, nausea, vomiting, nipple discharge, rash, or other abnormalities. Noted and occasional dry cough. She reports occasional night sweats. 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 She also reports a family history of breast cancer, reports history in her 19 and maternal side of the family, also reports family history of ovarian cancer.   Past Medical History:  Diagnosis Date  . Alcohol abuse   . Anemia   . Asthma    rarely uses inhaler, pt. reports she had out grown it  . Bronchitis   . 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 Fibroid   . GERD (gastroesophageal reflux disease)   . Hypertension    during post preg.  no problems now  . Hypertensive retinopathy    OU  . Missed ab    x 2, one requiring D & E  . Pancreatic cyst   . Pancreatitis    diet controlled  . Pelvic inflammatory disease   . Plantar fasciitis   . Pneumonia    h/o of in her teens  . Polycystic ovary syndrome   . 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 Renal cyst    scarring on the kidney  . Sickle cell trait ([place])    Review of Systems:   Constitutional: Negative for chills and fever.  Respiratory: Negative for shortness of breath.   Cardiovascular: Negative for chest pain and leg swelling. MSK: Positive for left breast pain, negative for discharge, rash, or  lumps. Gastrointestinal: Negative for abdominal pain, nausea and vomiting.  Neurological: Negative for dizziness and headaches.    Physical Exam:  Vitals:   08/19/19 0859  BP: 130/89  Pulse: 100  Temp: 98 F (36.7 C)  TempSrc: Oral  SpO2: 100%  Weight: 155 lb 11.2 oz (70.6 kg)   Physical Exam Constitutional:      Appearance: Normal appearance.  HENT:     Head: Normocephalic and atraumatic.     Nose: Nose normal.  Cardiovascular:     Rate and Rhythm: Normal rate and regular rhythm.     Pulses: Normal pulses.     Heart sounds: Normal heart sounds.  Pulmonary:     Effort: Pulmonary effort is normal.     Breath sounds: Normal breath sounds.  Chest:     Chest wall: No tenderness.     Breasts: Breasts are symmetrical.        Right: Normal. No bleeding or nipple discharge.        Left: Normal. No bleeding or nipple discharge.    Abdominal:     General: Abdomen is flat. Bowel sounds are normal. There is no distension.     Palpations: Abdomen is soft.  Musculoskeletal:        General: No swelling. Normal range of motion.     Cervical back: Normal range of motion and neck supple.  Lymphadenopathy:  Upper Body:     Right upper body: No supraclavicular or axillary adenopathy.     Left upper body: No supraclavicular or axillary adenopathy.  Skin:    General: Skin is warm.     Capillary Refill: Capillary refill takes less than 2 seconds.     Coloration: Skin is not pale.     Findings: No bruising, erythema, lesion or rash.  Neurological:     General: No focal deficit present.     Mental Status: She is alert and oriented to person, place, and time.  Psychiatric:        Mood and Affect: Mood normal.        Behavior: Behavior normal.     Assessment & Plan:   See Encounters Tab for problem based charting.  Patient discussed with Dr. Heber [place]

## 2019-08-19 NOTE — Assessment & Plan Note (Signed)
Patient remains on Creon, she continues to follow with gastroenterology.  She denies any recent flares.  She continues to have Percocet 5-300 mg as needed for flares. This is chronic and stable.

## 2019-08-20 NOTE — Progress Notes (Signed)
Internal Medicine Clinic Attending  Case discussed with Dr. Krienke at the time of the visit.  We reviewed the resident's history and exam and pertinent patient test results.  I agree with the assessment, diagnosis, and plan of care documented in the resident's note.    

## 2019-09-04 ENCOUNTER — Ambulatory Visit
Admission: RE | Admit: 2019-09-04 | Discharge: 2019-09-04 | Disposition: A | Discharge status: Home / Self Care | Payer: Medicaid Other | Source: Ambulatory Visit | Attending: Internal Medicine | Admitting: Internal Medicine

## 2019-09-04 ENCOUNTER — Other Ambulatory Visit: Payer: Self-pay

## 2019-09-04 ENCOUNTER — Other Ambulatory Visit: Payer: Self-pay | Admitting: Internal Medicine

## 2019-09-04 DIAGNOSIS — N644 Mastodynia: Secondary | ICD-10-CM

## 2019-09-04 DIAGNOSIS — R928 Other abnormal and inconclusive findings on diagnostic imaging of breast: Secondary | ICD-10-CM | POA: Diagnosis not present

## 2019-09-11 ENCOUNTER — Other Ambulatory Visit: Payer: Self-pay

## 2019-09-11 ENCOUNTER — Ambulatory Visit
Admission: RE | Admit: 2019-09-11 | Discharge: 2019-09-11 | Disposition: A | Discharge status: Home / Self Care | Payer: Medicaid Other | Source: Ambulatory Visit | Attending: Internal Medicine | Admitting: Internal Medicine

## 2019-09-11 DIAGNOSIS — N6321 Unspecified lump in the left breast, upper outer quadrant: Secondary | ICD-10-CM | POA: Diagnosis not present

## 2019-09-11 DIAGNOSIS — N644 Mastodynia: Secondary | ICD-10-CM

## 2020-02-05 ENCOUNTER — Encounter: Payer: Self-pay | Admitting: General Practice

## 2020-02-13 IMAGING — MR MR ABDOMEN WO/W CM MRCP
11 of 21 series · 18 of 48 positions shown · IV contrast (gadavist)
Comparison: CT scan 03/22/2019.

CLINICAL DATA: History of distal pancreatectomy for mucinous cystic
neoplasm in 7417. Ongoing epigastric abdominal pain.

EXAM:
MRI ABDOMEN WITHOUT AND WITH CONTRAST (INCLUDING MRCP)
TECHNIQUE: Multiplanar multisequence MR imaging of the abdomen was performed
both before and after the administration of intravenous contrast.
Heavily T2-weighted images of the biliary and pancreatic ducts were
obtained, and three-dimensional MRCP images were rendered by post
processing.
CONTRAST:  7mL GADAVIST GADOBUTROL 1 MMOL/ML IV SOLN

[Series 1: 3 plane non · axial · 8.0mm · 0.78mm/px · 1 of 15 slices shown]
[im 1/15]
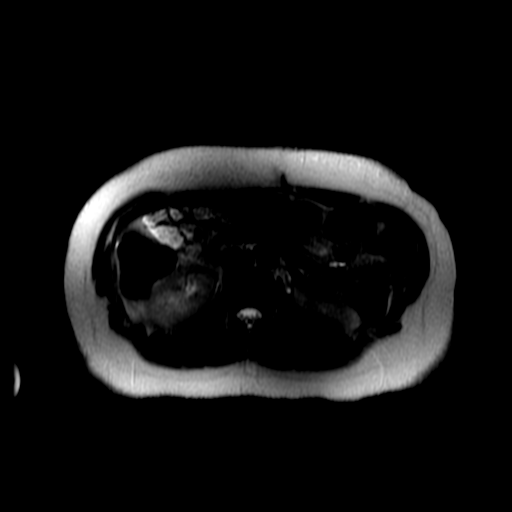

[Series 3: T2 fat-sat · axial · 5.0mm · 0.86mm/px · 1 of 54 slices shown]
[im 1/54]
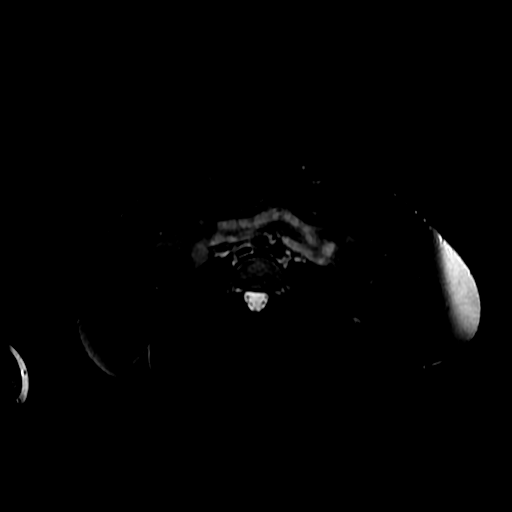

[Series 5: MRCP · coronal · 2.0mm · 0.70mm/px · 1 of 45 slices shown (1 of 2)]
[im 1/45]
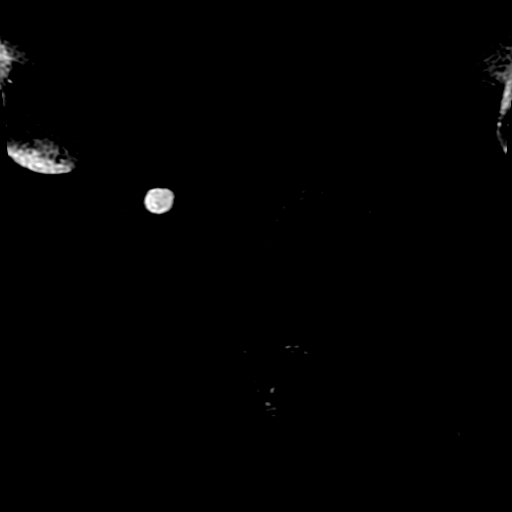

[Series 6: DWI b500 · axial · 6.0mm · 1.76mm/px · 1 of 71 slices shown]
[im 1/71]
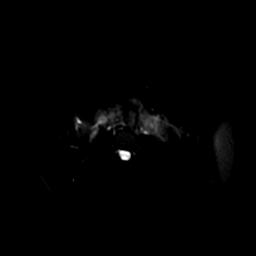

[Series 7: T2 · coronal · 5.0mm · 0.74mm/px · 1 of 37 slices shown (1 of 2)]
[im 1/37]
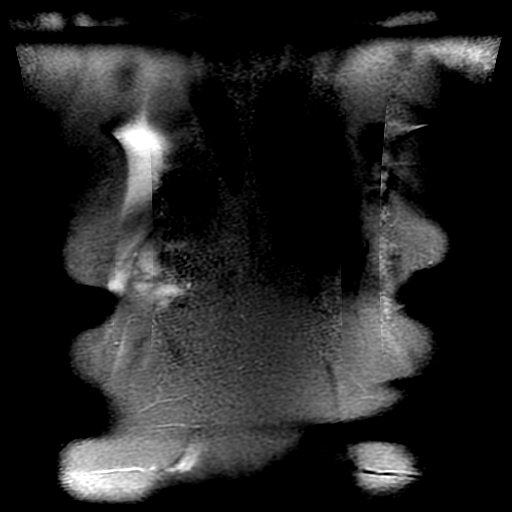

[Series 8: T2 · axial · 5.0mm · 0.86mm/px · 1 of 54 slices shown (2 of 2)]
[im 1/54]
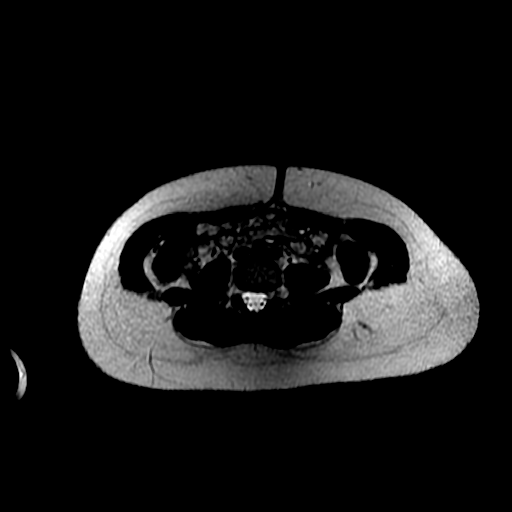

[Series 9: ax dualecho bh · axial · 5.0mm · 0.86mm/px · z∈[-118,+147]mm · 2 of 108 slices shown]
[im 1/108]
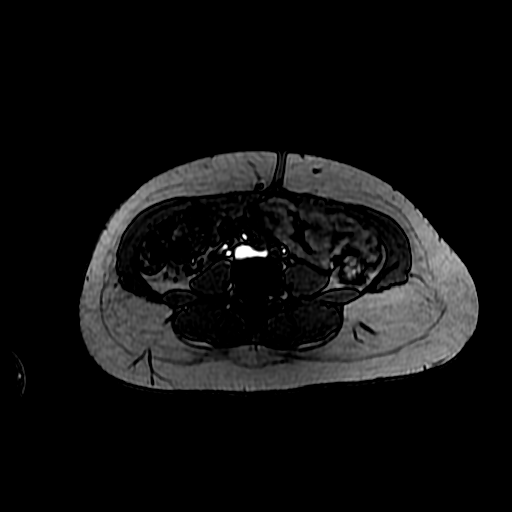
[im 108/108]
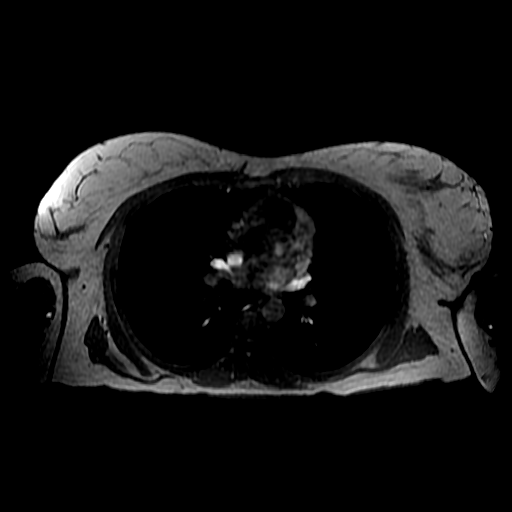

[Series 10: MRCP · coronal · 40.0mm · 0.70mm/px · 1 of 6 slices shown (2 of 2)]
[im 1/6]
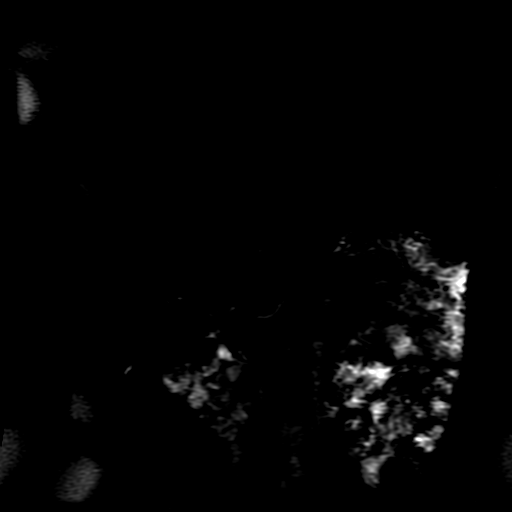

[Series 12: T1 dynamic · coronal · delayed · 4.0mm · 0.78mm/px · 2 of 88 slices shown (1 of 2)]
[im 1/88]
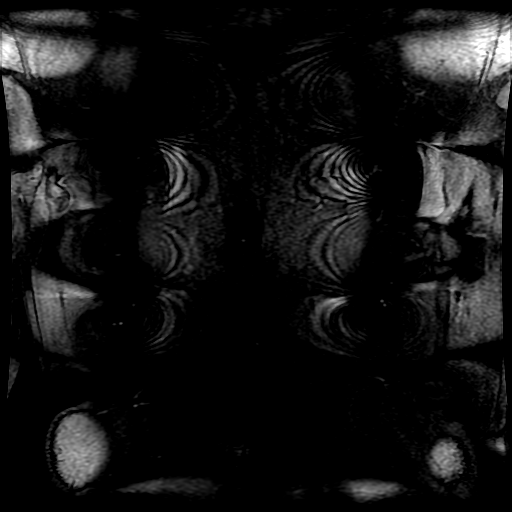
[im 88/88]
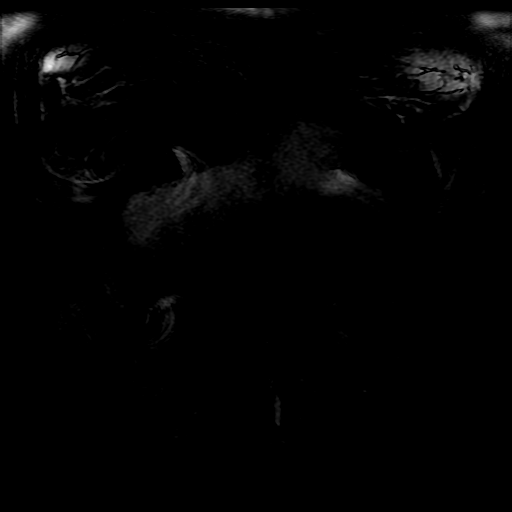

[Series 13: T1 dynamic · axial · 5.0mm · 0.78mm/px · z∈[-92,+136]mm · 3 of 92 slices shown (2 of 2)]
[im 1/92]
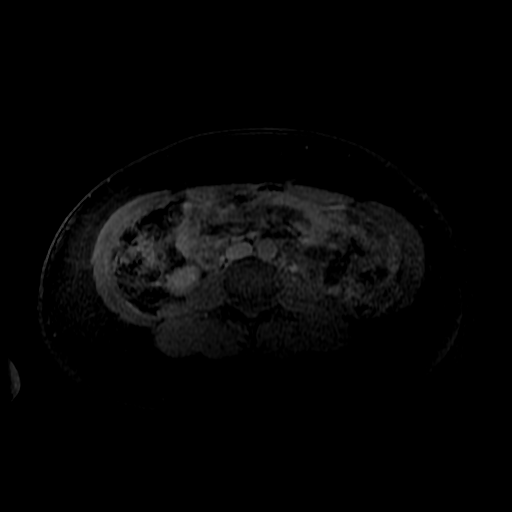
[im 46/92]
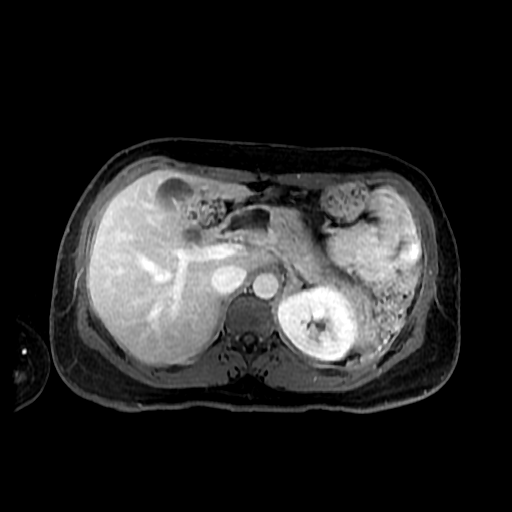
[im 92/92]
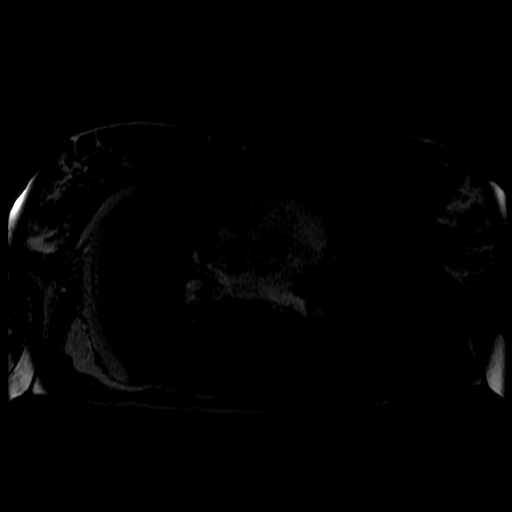

[Series 400: reformatted · axial · 1.6mm · 0.62mm/px · z∈[+7,+136]mm · 4 of 146 slices shown]
[im 1/146]
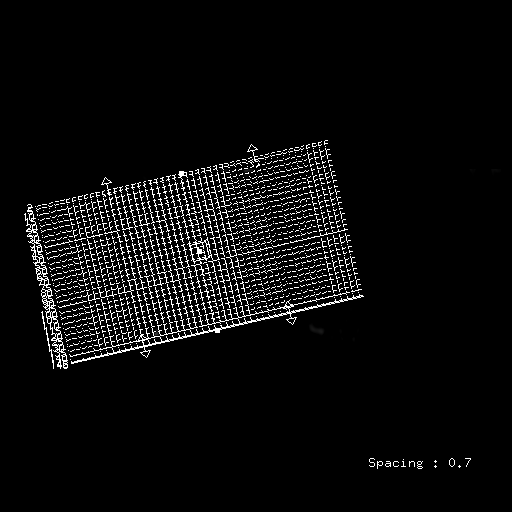
[im 49/146]
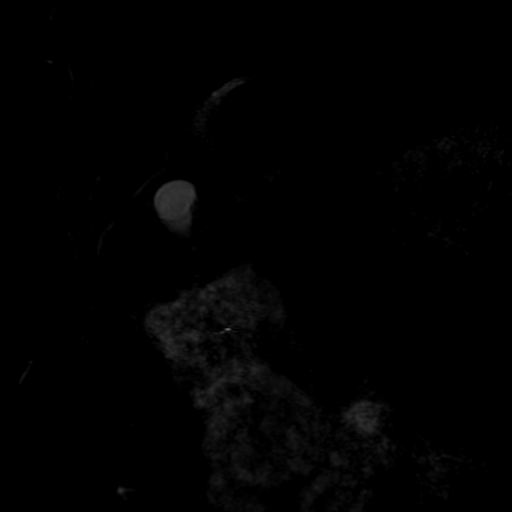
[im 97/146]
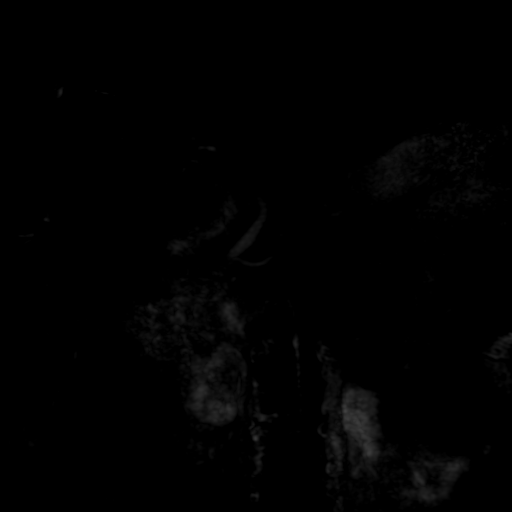
[im 146/146]
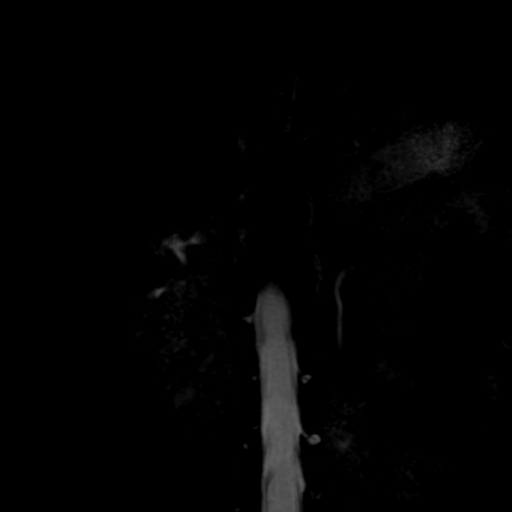

[18 of 48 positions shown; findings below may reference images not displayed]

FINDINGS: Lower chest: Unremarkable.

Hepatobiliary: 6 mm T2 hyperintensity in the subcapsular posterior
hepatic dome is difficult to characterize given the tiny size, but
is probably a cyst. Tiny cavernous hemangioma is also a
consideration. There is no evidence for gallstones, gallbladder wall
thickening, or pericholecystic fluid.

There is no intrahepatic biliary duct dilatation although focal
narrowing of the common duct identified about 1.8 cm distal to the
left and right biliary duct confluence (coronal image 91 of series
400). Common bile duct distal to this level is nondilated measuring
6 mm maximum diameter. No evidence for choledocholithiasis.

Pancreas: Status post distal pancreatectomy. Pancreatic head and
neck are normal in appearance. No dilatation of the main pancreatic
duct. No evidence for pancreas divisum.

Spleen:  Surgically absent.

Adrenals/Urinary Tract: No adrenal nodule or mass. Kidneys
unremarkable.

Stomach/Bowel: Stomach is unremarkable. No gastric wall thickening.
No evidence of outlet obstruction. Duodenum is normally positioned
as is the ligament of Treitz. No small bowel or colonic dilatation
within the visualized abdomen.

Vascular/Lymphatic: No abdominal aortic aneurysm. There is no
gastrohepatic or hepatoduodenal ligament lymphadenopathy. No
retroperitoneal or mesenteric lymphadenopathy.

Other:  No intraperitoneal free fluid.

Musculoskeletal: No abnormal marrow enhancement within the
visualized bony anatomy.
IMPRESSION: 1. Status post distal pancreatectomy and splenectomy. No dilatation
of the main pancreatic duct. No evidence for pancreas divisum. No
findings to suggest pancreatitis.
2. No intrahepatic biliary duct dilatation. There is focal, smooth
narrowing of the common duct about 1.8 cm distal to the left and
right biliary duct confluence. No evidence for choledocholithiasis.
No mass lesion evident. Given lack of intrahepatic biliary duct
dilatation, significance of this finding is indeterminate.
Correlation with liver function test may prove helpful.
3. 6 mm T2 hyperintensity in the subcapsular posterior hepatic dome
is difficult to characterize given the tiny size, but is probably a
cyst.

## 2020-02-18 DIAGNOSIS — K862 Cyst of pancreas: Secondary | ICD-10-CM | POA: Diagnosis not present

## 2020-02-18 DIAGNOSIS — K8689 Other specified diseases of pancreas: Secondary | ICD-10-CM | POA: Diagnosis not present

## 2020-02-18 DIAGNOSIS — R110 Nausea: Secondary | ICD-10-CM | POA: Diagnosis not present

## 2020-03-11 DIAGNOSIS — K8689 Other specified diseases of pancreas: Secondary | ICD-10-CM | POA: Diagnosis not present

## 2020-03-11 DIAGNOSIS — K861 Other chronic pancreatitis: Secondary | ICD-10-CM | POA: Diagnosis not present

## 2020-03-11 DIAGNOSIS — Z9081 Acquired absence of spleen: Secondary | ICD-10-CM | POA: Diagnosis not present

## 2020-03-11 DIAGNOSIS — K859 Acute pancreatitis without necrosis or infection, unspecified: Secondary | ICD-10-CM | POA: Diagnosis not present

## 2020-03-11 DIAGNOSIS — Z90411 Acquired partial absence of pancreas: Secondary | ICD-10-CM | POA: Diagnosis not present

## 2020-03-11 DIAGNOSIS — R935 Abnormal findings on diagnostic imaging of other abdominal regions, including retroperitoneum: Secondary | ICD-10-CM | POA: Diagnosis not present

## 2020-04-09 ENCOUNTER — Other Ambulatory Visit: Payer: Self-pay | Admitting: Internal Medicine

## 2020-04-09 DIAGNOSIS — E139 Other specified diabetes mellitus without complications: Secondary | ICD-10-CM

## 2020-04-09 NOTE — Telephone Encounter (Signed)
Last office visit: 08/19/2019 Next appt: none scheduled -message sent to front office to assist with scheduling

## 2020-04-13 ENCOUNTER — Other Ambulatory Visit: Payer: Self-pay | Admitting: *Deleted

## 2020-04-13 DIAGNOSIS — E139 Other specified diabetes mellitus without complications: Secondary | ICD-10-CM

## 2020-04-13 MED ORDER — METFORMIN HCL 500 MG PO TABS: 500.0000 mg | ORAL_TABLET | Freq: Two times a day (BID) | ORAL | 5 refills | Status: DC

## 2020-04-13 NOTE — Telephone Encounter (Signed)
Fax from [facility] - states Dr Allyson Sabal is not enrolled in [place]. I will ask Attending today to send new rx.

## 2020-06-24 DIAGNOSIS — U071 COVID-19: Secondary | ICD-10-CM | POA: Diagnosis not present

## 2020-06-24 DIAGNOSIS — R509 Fever, unspecified: Secondary | ICD-10-CM | POA: Diagnosis not present

## 2020-06-24 DIAGNOSIS — R059 Cough, unspecified: Secondary | ICD-10-CM | POA: Diagnosis not present

## 2020-06-30 ENCOUNTER — Ambulatory Visit: Payer: Medicaid Other | Admitting: Internal Medicine

## 2020-06-30 ENCOUNTER — Other Ambulatory Visit (HOSPITAL_COMMUNITY)
Admission: RE | Admit: 2020-06-30 | Discharge: 2020-06-30 | Disposition: A | Discharge status: Home / Self Care | Payer: Medicaid Other | Source: Ambulatory Visit | Attending: Internal Medicine | Admitting: Internal Medicine

## 2020-06-30 ENCOUNTER — Encounter: Payer: Self-pay | Admitting: Internal Medicine

## 2020-06-30 VITALS — BP 110/88 | HR 94 | Temp 98.3°F | Ht 60.0 in | Wt 162.3 lb

## 2020-06-30 DIAGNOSIS — N898 Other specified noninflammatory disorders of vagina: Secondary | ICD-10-CM | POA: Diagnosis not present

## 2020-06-30 DIAGNOSIS — Z8616 Personal history of COVID-19: Secondary | ICD-10-CM | POA: Diagnosis not present

## 2020-06-30 DIAGNOSIS — Z0000 Encounter for general adult medical examination without abnormal findings: Secondary | ICD-10-CM | POA: Diagnosis not present

## 2020-06-30 DIAGNOSIS — E139 Other specified diabetes mellitus without complications: Secondary | ICD-10-CM | POA: Diagnosis not present

## 2020-06-30 DIAGNOSIS — E559 Vitamin D deficiency, unspecified: Secondary | ICD-10-CM

## 2020-06-30 DIAGNOSIS — I10 Essential (primary) hypertension: Secondary | ICD-10-CM | POA: Diagnosis not present

## 2020-06-30 DIAGNOSIS — R7303 Prediabetes: Secondary | ICD-10-CM | POA: Diagnosis not present

## 2020-06-30 DIAGNOSIS — R350 Frequency of micturition: Secondary | ICD-10-CM | POA: Diagnosis not present

## 2020-06-30 DIAGNOSIS — R109 Unspecified abdominal pain: Secondary | ICD-10-CM

## 2020-06-30 LAB — POCT GLYCOSYLATED HEMOGLOBIN (HGB A1C): Hemoglobin A1C: 6.5 % — AB (ref 4.0–5.6)

## 2020-06-30 LAB — GLUCOSE, CAPILLARY: Glucose-Capillary: 172 mg/dL — ABNORMAL HIGH (ref 70–99)

## 2020-06-30 NOTE — Progress Notes (Signed)
CC: DM  HPI:  [name] is a 35 y.o. female with a past medical history stated below and presents today for DM. Please see problem based assessment and plan for additional details.  Past Medical History:  Diagnosis Date  . Alcohol abuse   . Anemia   . Asthma    rarely uses inhaler, pt. reports she had out grown it  . Bronchitis   . Diabetes mellitus without complication (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 Dysmenorrhea 03/28/2019  . Fibroid   . GERD (gastroesophageal reflux disease)   . Hypertension    during post preg.  no problems now  . Hypertensive retinopathy    OU  . Menorrhagia 11/01/2018  . Missed ab    x 2, one requiring D & E  . Pancreatic cyst   . Pancreatitis    diet controlled  . Pelvic inflammatory disease   . Plantar fasciitis   . Pneumonia    h/o of in her teens  . Polycystic ovary syndrome   . 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 Renal cyst    scarring on the kidney  . Sickle cell trait ([place])     Current Outpatient Medications on File Prior to Visit  Medication Sig Dispense Refill  . CREON 36000 units CPEP capsule TAKE 2 CAPSULES (72,000 UNITS) BY MOUTH 3 TIMES DAILY WITH MEALS AND 1 CAPSULE WITH SNACK (Patient taking differently: Take 36,000-72,000 Units by mouth See admin instructions. Take 72000 units by mouth three times daily with meals and 36000 mg with a snack) 320 capsule 3  . ibuprofen (ADVIL) 600 MG tablet Take 1 tablet (600 mg total) by mouth every 6 (six) hours as needed. (Patient not taking: Reported on 08/14/2019) 30 tablet 0  . metFORMIN (GLUCOPHAGE) 500 MG tablet Take 1 tablet (500 mg total) by mouth 2 (two) times daily. 60 tablet 5  . pantoprazole (PROTONIX) 40 MG tablet Take 1 tablet (40 mg total) by mouth daily. (Patient taking differently: Take 40 mg by mouth every evening. ) 60 tablet 3   No current facility-administered medications on file prior to visit.    Family History  Problem Relation Age of Onset   . Diabetes Mother   . Hypertension Mother   . Asthma Father   . Glaucoma Sister   . Glaucoma Paternal Grandmother   . Breast cancer Paternal Grandmother 54  . Breast cancer Maternal Aunt 60  . Breast cancer Maternal Aunt 70  . Colon cancer Neg Hx     Social History   Socioeconomic History  . Marital status: Single    Spouse name: Not on file  . Number of children: 2  . Years of education: Not on file  . Highest education level: Not on file  Occupational History  . Occupation: Homemaker  Tobacco Use  . Smoking status: Current Every Day Smoker    Packs/day: 0.25    Years: 13.00    Pack years: 3.25    Types: Cigarettes  . Smokeless tobacco: Never Used  . Tobacco comment: smokes 4 cigs/day, tobacco info given 04/17/15  Vaping Use  . Vaping Use: Never used  Substance and Sexual Activity  . Alcohol use: No    Alcohol/week: 0.0 standard drinks    Comment: social   . Drug use: No  . Sexual activity: Not Currently    Partners: Male    Birth control/protection: None  Other Topics Concern  . Not on file  Social History Narrative  .  Not on file   Social Determinants of Health   Financial Resource Strain: Not on file  Food Insecurity: No Food Insecurity  . Worried About Charity fundraiser in the Last Year: Never true  . Ran Out of Food in the Last Year: Never true  Transportation Needs: No Transportation Needs  . Lack of Transportation (Medical): No  . Lack of Transportation (Non-Medical): No  Physical Activity: Not on file  Stress: Not on file  Social Connections: Not on file  Intimate Partner Violence: Not on file    Review of Systems: ROS negative except for what is noted on the assessment and plan.  Vitals:   06/30/20 0859  BP: 110/88  Pulse: 94  Temp: 98.3 F (36.8 C)  TempSrc: Oral  SpO2: 99%  Weight: 162 lb 4.8 oz (73.6 kg)  Height: 5' (1.524 m)    Physical Exam: Gen: A&O x3 and in no apparent distress, well appearing and nourished. HEENT: Head  - normocephalic, atraumatic. Eye -  visual acuity grossly intact, conjunctiva clear, sclera non-icteric, EOM intact. Mouth - No obvious caries or periodontal disease. Neck: no obvious masses or nodules, AROM intact. CV: RRR, no murmurs, rubs, or gallops. S1/S2 presents  Resp: Clear to ascultation bilaterally  Abd: BS (+) x4, soft, non-tender, without obvious hepatosplenomegaly or masses. Flank pain that is not TTP MSK: Grossly normal AROM and strength x4 extremities.  Skin: good skin turgor, no rashes, unusual bruising, or prominent lesions.  Neuro: No focal deficits, grossly normal sensation and coordination.  Psych: Oriented x3 and responding appropriately. Intact recent and remote memory, normal mood, judgement, affect , and insight.    Assessment & Plan:   See Encounters Tab for problem based charting.  Patient discussed with Dr. Hilbert Odor, D.O. [facility], PGY-2 Pager: [phone], Phone: [phone] Date 07/01/2020 Time 11:01 AM

## 2020-06-30 NOTE — Patient Instructions (Addendum)
Thank you, [name] for allowing [place] to provide your care today. Today we discussed prediabetes, urinary symptoms, vitamin D.    I have ordered the following labs for you:   Lab Orders     Culture, Urine     BMP8+Anion Gap     Microalbumin / Creatinine Urine Ratio     Hepatitis C antibody     Glucose, capillary     Vitamin D (25 hydroxy)     Urinalysis, Reflex Microscopic     POC Hbg A1C   Tests ordered today:  Vaginal Swab  Referrals ordered today:   Referral Orders  No referral(s) requested today     Medication Changes:   none   Follow up: 1 month with PCP or sooner if flank pain continues.  Remember:    Should you have any questions or concerns please call the internal medicine clinic at [phone].     Marianna Payment, D.O. [facility]            CDC states that infection prevention precautions specific for COVID-19 home isolation can be discontinued when the following criteria are met [92]: -At least 10 days have passed since symptoms first appeared; and -At least one day (24 hours) has passed since resolution of fever without the use of fever-reducing medications; and  -There is improvement in symptoms (eg, cough, shortness of breath).

## 2020-07-01 ENCOUNTER — Encounter: Payer: Self-pay | Admitting: Internal Medicine

## 2020-07-01 DIAGNOSIS — E119 Type 2 diabetes mellitus without complications: Secondary | ICD-10-CM | POA: Insufficient documentation

## 2020-07-01 DIAGNOSIS — R7303 Prediabetes: Secondary | ICD-10-CM | POA: Insufficient documentation

## 2020-07-01 DIAGNOSIS — N898 Other specified noninflammatory disorders of vagina: Secondary | ICD-10-CM | POA: Insufficient documentation

## 2020-07-01 DIAGNOSIS — Z8616 Personal history of COVID-19: Secondary | ICD-10-CM | POA: Insufficient documentation

## 2020-07-01 DIAGNOSIS — B9689 Other specified bacterial agents as the cause of diseases classified elsewhere: Secondary | ICD-10-CM | POA: Insufficient documentation

## 2020-07-01 DIAGNOSIS — N760 Acute vaginitis: Secondary | ICD-10-CM | POA: Insufficient documentation

## 2020-07-01 LAB — BMP8+ANION GAP
Anion Gap: 16 mmol/L (ref 10.0–18.0)
BUN/Creatinine Ratio: 13 (ref 9–23)
BUN: 8 mg/dL (ref 6–20)
CO2: 23 mmol/L (ref 20–29)
Calcium: 9.2 mg/dL (ref 8.7–10.2)
Chloride: 102 mmol/L (ref 96–106)
Creatinine, Ser: 0.62 mg/dL (ref 0.57–1.00)
Glucose: 144 mg/dL — ABNORMAL HIGH (ref 65–99)
Potassium: 4.9 mmol/L (ref 3.5–5.2)
Sodium: 141 mmol/L (ref 134–144)
eGFR: 118 mL/min/{1.73_m2} (ref >59)

## 2020-07-01 LAB — URINALYSIS, ROUTINE W REFLEX MICROSCOPIC
Bilirubin, UA: NEGATIVE
Glucose, UA: NEGATIVE
Ketones, UA: NEGATIVE
Leukocytes,UA: NEGATIVE
Nitrite, UA: NEGATIVE
Protein,UA: NEGATIVE
RBC, UA: NEGATIVE
Specific Gravity, UA: 1.019 (ref 1.005–1.030)
Urobilinogen, Ur: 1 mg/dL (ref 0.2–1.0)
pH, UA: 7.5 (ref 5.0–7.5)

## 2020-07-01 LAB — MICROALBUMIN / CREATININE URINE RATIO
Creatinine, Urine: 104.2 mg/dL
Microalb/Creat Ratio: 8 mg/g creat (ref 0–29)
Microalbumin, Urine: 8.2 ug/mL

## 2020-07-01 LAB — CERVICOVAGINAL ANCILLARY ONLY
Bacterial Vaginitis (gardnerella): POSITIVE — AB
Candida Glabrata: NEGATIVE
Candida Vaginitis: NEGATIVE
Chlamydia: NEGATIVE
Comment: NEGATIVE
Comment: NEGATIVE
Comment: NEGATIVE
Comment: NEGATIVE
Comment: NEGATIVE
Comment: NORMAL
Neisseria Gonorrhea: NEGATIVE
Trichomonas: NEGATIVE

## 2020-07-01 LAB — VITAMIN D 25 HYDROXY (VIT D DEFICIENCY, FRACTURES): Vit D, 25-Hydroxy: 10.2 ng/mL — ABNORMAL LOW (ref 30.0–100.0)

## 2020-07-01 LAB — HEPATITIS C ANTIBODY: Hep C Virus Ab: <0.1 s/co ratio (ref 0.0–0.9)

## 2020-07-01 MED ORDER — VITAMIN D (ERGOCALCIFEROL) 1.25 MG (50000 UNIT) PO CAPS: 50000.0000 [IU] | ORAL_CAPSULE | ORAL | 0 refills | Status: AC

## 2020-07-01 NOTE — Assessment & Plan Note (Addendum)
Patient presents with a 3-day history of mild flank pain on the left side.  The pain is consistent without any alleviating factors.  She denies any triggering factors either.  There is no radiation of the pain.  The pain is point tender.  Denies any associated GI symptoms.  She states that this does not feel like her past pancreatitis symptoms either.  She states that it does not affect her appetite or mobility.  She denies any fevers, chills, shortness of breath, night sweats, weight loss.  She has no CVA tenderness or point tenderness on exam   Considering the patient had significant intra-abdominal surgeries, I am concerned the patient is high risk for complications secondary to adhesions.  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  If the patient continues to have symptoms of flank pain, she may need further evaluation with KUB versus CT abdomen and pelvis to rule out intra-abdominal pathology.  Plan: 1. Monitor closely 2.  Gave strict return precautions regarding her flank pain 3.  We will follow-up in 1 month to reevaluate her symptoms.

## 2020-07-01 NOTE — Assessment & Plan Note (Addendum)
Patient has a history of prediabetes with her previous A1c back in 04/07 of 6.0.  She did have a previous funduscopic exam showing retinopathy.  Unsure if this is due to diabetic retinopathy.  She denies any vision changes at this time.  Patient does check her blood sugars routinely and reports her highest fasting blood sugar of 163 but routinely lower than this.  She believes this elevation is due to recent illness and has affected her diet.  She admits to adherence to her metformin without any significant side effects.   Plan: 1.  Continue metformin 500 mg twice daily 2.  Will need follow-up funduscopic exam to further work-up for retinopathy in the future   **ADDENDUM** Patient's recent A1c came back as elevated at 6.5 in the diabetic range.  I called her to inform her of this.  We will continue the current medications and repeat her A1c in 3 months.  I asked her to bring her glucometer in with her at this appointment to further evaluate her blood sugars.

## 2020-07-01 NOTE — Assessment & Plan Note (Signed)
Patient has a history of vitamin D deficiency requiring supplementation in the past.  This has not been checked recently and the patient denies current supplementation.   Plan: 1.  Repeat vitamin D level today.   **ADDENDUM** Patient's vitamin D level returned low around 10.  Therefore I will supplement her vitamin D with 50,000 units of vitamin D weekly for 4 to 6 weeks and then reevaluate her in the clinic with a repeat vitamin D level.

## 2020-07-01 NOTE — Assessment & Plan Note (Signed)
Patient admits to 1 week history of vaginal discharge.  She admits to associated urinary frequency and increased nocturia.  She denies any risk factors for STIs stating that she has been in a monogamous relationship for the past several years.  She admits to concerns regarding possible yeast infection, stating that she gets these infections when her blood sugar is significantly elevated.  She denies any dysuria, hematuria, fevers/chills, or abdominal pain.  Plan: - UA and culture -Vaginal swab for Candida, GC, chlamydia - HCV ordered today.

## 2020-07-01 NOTE — Assessment & Plan Note (Signed)
Patient presents with a 9-day history of COVID-19 upper respiratory infection.  Patient states that she started feeling sick this past Monday and then tested positive on Wednesday.  She states that her symptoms have largely improved with mild residual cough.  Fever was on Wednesday.  I discussed isolation precautions and gave her a work note to return after 10 days of isolation at home. I counseled her on ways to decrease her risk of transmitting the virus to her family and friends.  She admits understanding

## 2020-07-02 ENCOUNTER — Telehealth: Payer: Self-pay | Admitting: Internal Medicine

## 2020-07-02 DIAGNOSIS — B9689 Other specified bacterial agents as the cause of diseases classified elsewhere: Secondary | ICD-10-CM

## 2020-07-02 LAB — URINE CULTURE

## 2020-07-02 MED ORDER — METRONIDAZOLE 500 MG PO TABS: 500.0000 mg | ORAL_TABLET | Freq: Two times a day (BID) | ORAL | 0 refills | Status: AC

## 2020-07-02 NOTE — Telephone Encounter (Signed)
Patient called and given the results of her vaginal swab. She has a history of total hysterectomy and therefore is not pregnant. Will prescribe metronidazole 500 mg BID for 7 days. Patient is aware and agrees with plan. I counseled her regarding return precautions if her symptoms continue.   Lawerance Cruel, D.O.  Internal Medicine Resident, PGY-2 [facility]  Pager: [phone] 11:36 AM, 07/02/2020

## 2020-07-06 ENCOUNTER — Other Ambulatory Visit: Payer: Self-pay

## 2020-07-06 ENCOUNTER — Encounter: Payer: Self-pay | Admitting: Internal Medicine

## 2020-07-06 ENCOUNTER — Ambulatory Visit: Payer: Medicaid Other | Admitting: Internal Medicine

## 2020-07-06 ENCOUNTER — Ambulatory Visit (HOSPITAL_COMMUNITY)
Admission: RE | Admit: 2020-07-06 | Discharge: 2020-07-06 | Disposition: A | Discharge status: Home / Self Care | Payer: Medicaid Other | Source: Ambulatory Visit | Attending: Internal Medicine | Admitting: Internal Medicine

## 2020-07-06 VITALS — BP 133/98 | HR 87 | Temp 98.3°F | Ht 60.0 in | Wt 158.5 lb

## 2020-07-06 DIAGNOSIS — R002 Palpitations: Secondary | ICD-10-CM | POA: Insufficient documentation

## 2020-07-06 DIAGNOSIS — R718 Other abnormality of red blood cells: Secondary | ICD-10-CM

## 2020-07-06 DIAGNOSIS — E089 Diabetes mellitus due to underlying condition without complications: Secondary | ICD-10-CM

## 2020-07-06 NOTE — Assessment & Plan Note (Signed)
Patient is currently on metformin 500 mg twice daily, denies any issues taking this medication.  Her last A1c was elevated at 6.5.  She has a eye doctor that she goes to, last seen 1 year ago.  Advised patient to follow-up to obtain annual eye exam.  Also performed foot exam today. -Continue metformin 500 mg twice daily -Repeat A1c in 3 months

## 2020-07-06 NOTE — Progress Notes (Signed)
   CC: Palpitations  HPI:  [name] is a 35 y.o. with a history listed below including gestational hypertension, asthma, chronic pancreatitis, recent history of COVID infection, recent BV infection is presenting with concerns of palpitations.  Past Medical History:  Diagnosis Date  . Alcohol abuse   . Anemia   . Asthma    rarely uses inhaler, pt. reports she had out grown it  . Bronchitis   . 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 Dysmenorrhea 03/28/2019  . Fibroid   . GERD (gastroesophageal reflux disease)   . Hypertension    during post preg.  no problems now  . Hypertensive retinopathy    OU  . Menorrhagia 11/01/2018  . Missed ab    x 2, one requiring D & E  . Pancreatic cyst   . Pancreatitis    diet controlled  . Pelvic inflammatory disease   . Plantar fasciitis   . Pneumonia    h/o of in her teens  . Polycystic ovary syndrome   . 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 Renal cyst    scarring on the kidney  . Sickle cell trait ([place])    Review of Systems:   Constitutional: Negative for chills and fever.  Respiratory: Negative for shortness of breath.   Cardiovascular: Negative for chest pain and leg swelling. Positive for palpitations.  Gastrointestinal: Negative for abdominal pain, nausea and vomiting.  Neurological: Negative for dizziness, LOC, or weakness. Positive for headaches.    Physical Exam:  Vitals:   07/06/20 1311 07/06/20 1318  BP: (!) 128/97 (!) 133/98  Pulse: 89 87  Temp: 98.3 F (36.8 C)   TempSrc: Oral   SpO2: 100%   Weight: 158 lb 8 oz (71.9 kg)   Height: 5' (1.524 m)    Physical Exam Constitutional:      Appearance: Normal appearance.  HENT:     Head: Normocephalic and atraumatic.     Nose: Nose normal.     Mouth/Throat:     Mouth: Mucous membranes are moist.     Pharynx: Oropharynx is clear.  Eyes:     Extraocular Movements: Extraocular movements intact.      Conjunctiva/sclera: Conjunctivae normal.  Cardiovascular:     Rate and Rhythm: Normal rate and regular rhythm.     Pulses: Normal pulses.     Heart sounds: Normal heart sounds. No murmur heard. No gallop.   Pulmonary:     Effort: Pulmonary effort is normal.     Breath sounds: Normal breath sounds. No wheezing.  Abdominal:     General: Abdomen is flat. Bowel sounds are normal.     Palpations: Abdomen is soft.  Musculoskeletal:     Cervical back: Normal range of motion and neck supple.  Skin:    General: Skin is warm and dry.     Capillary Refill: Capillary refill takes less than 2 seconds.  Neurological:     General: No focal deficit present.     Mental Status: She is alert and oriented to person, place, and time.  Psychiatric:        Mood and Affect: Mood normal.        Behavior: Behavior normal.      Assessment & Plan:   See Encounters Tab for problem based charting.  Patient discussed with Dr. Evette Doffing

## 2020-07-06 NOTE — Patient Instructions (Addendum)
[name],  It was a pleasure to see you today. Thank you for coming in.   Today we discussed your palpitations (fast heart rate). We did an EKG today and this was reassuring. I am checking some labs today, including a thyroid lab and some blood counts. I will contact you with the results. Please contact us if the symptoms occur again.   We also discussed your blood pressure. This is a little higher then normal but we will hold off on starting any medications for now.  Please return to clinic in 1 month or sooner if needed.   Thank you again for coming in.   [name].D.

## 2020-07-06 NOTE — Assessment & Plan Note (Signed)
This morning around 4 AM she was woken up by her daughter who needed a diaper change, she states that she noted her heart rate was going very quickly, she states it was associated with headache and a feeling of coldness. States that it lasted for approximately 1 hour and resolved with no intervention.  She denied any dizziness, shortness of breath, chest pain, sweating, loss of consciousness, fevers, chills, nausea, vomiting, abdominal pain, or other associated symptoms.  She has never had anything like this before.  She denied any recent caffeine or alcohol use. Yesterday she did her typical activities that included getting ready for the week.  She does states she smokes cigarettes, approximately 1/3 pack per day.  She denies any family history of cardiac issues, denies any history of sudden cardiac death.  She does have a recent COVID infection approximately 2 weeks ago that her symptoms have mostly resolved, still has some mild congestion, and a recent history of bacterial vaginosis that she is currently on metronidazole.  Denies any recent changes in her medications otherwise. On exam her cardiac and pulmonary exam are both unremarkable, no murmurs rubs or gallops were noted, with normal S1 and S2.  EKG was obtained that showed normal sinus rhythm, heart rate around 85, PR interval 196, QT C of 433, no acute ST changes noted.  Unclear what is causing her palpitations, will obtain thyroid studies and CBC to evaluate.  Her EKG is reassuring.  This could be related to her recent infection versus stress versus thyroid disorder versus anemia.  Discussed that if her labs are reassuring then we can continue to monitor for now.  However if symptoms recur then we can consider cardiology referral for possible ambulatory monitor.  -Check TSH -Check CBC -Advised to continue to monitor for symptoms, consider cardiology referral if symptoms return

## 2020-07-07 LAB — CBC WITH DIFFERENTIAL/PLATELET
Basophils Absolute: 0.1 10*3/uL (ref 0.0–0.2)
Basos: 1 %
EOS (ABSOLUTE): 0 10*3/uL (ref 0.0–0.4)
Eos: 0 %
Hematocrit: 43.5 % (ref 34.0–46.6)
Hemoglobin: 14.2 g/dL (ref 11.1–15.9)
Immature Grans (Abs): 0 10*3/uL (ref 0.0–0.1)
Immature Granulocytes: 0 %
Lymphocytes Absolute: 2.1 10*3/uL (ref 0.7–3.1)
Lymphs: 28 %
MCH: 24 pg — ABNORMAL LOW (ref 26.6–33.0)
MCHC: 32.6 g/dL (ref 31.5–35.7)
MCV: 74 fL — ABNORMAL LOW (ref 79–97)
Monocytes Absolute: 0.9 10*3/uL (ref 0.1–0.9)
Monocytes: 12 %
Neutrophils Absolute: 4.4 10*3/uL (ref 1.4–7.0)
Neutrophils: 59 %
Platelets: 426 10*3/uL (ref 150–450)
RBC: 5.91 x10E6/uL — ABNORMAL HIGH (ref 3.77–5.28)
RDW: 16.8 % — ABNORMAL HIGH (ref 11.7–15.4)
WBC: 7.5 10*3/uL (ref 3.4–10.8)

## 2020-07-07 LAB — TSH: TSH: 0.935 u[IU]/mL (ref 0.450–4.500)

## 2020-07-07 NOTE — Progress Notes (Signed)
[facility] Medicine Clinic Attending ° °Case discussed with Dr. Krienke  At the time of the visit.  We reviewed the resident’s history and exam and pertinent patient test results.  I agree with the assessment, diagnosis, and plan of care documented in the resident’s note.  °

## 2020-07-08 NOTE — Progress Notes (Signed)
[facility] Clinic Attending  Case discussed with Dr. Coe  At the time of the visit.  We reviewed the resident's history and exam and pertinent patient test results.  I agree with the assessment, diagnosis, and plan of care documented in the resident's note.  

## 2020-07-09 DIAGNOSIS — R718 Other abnormality of red blood cells: Secondary | ICD-10-CM | POA: Insufficient documentation

## 2020-07-09 NOTE — Assessment & Plan Note (Signed)
Obtain CBC for work-up of palpitations.  CBC showed hemoglobin 14, MCV 74, RDW 17.  Last CBC showed a hemoglobin of 10.9 and MCV of 66.  Underwent a total hysterectomy approximately 1 year ago for history of menorrhagia and iron deficiency anemia.  Discussed with results with patient in informed her of normal hemoglobin level with small size.  Discussed that this could be related to her not having her iron stores completely repleted however there can also be other causes.  We discussed other common possibilities such as thalassemia trait, anemia of chronic disease.  Discussed that we can repeat the lab on her next visit and work it up if it remains low. However she requested that this be further worked up now due to her prolonged course with her pancreatitis.   -Repeat CBC -Obtain iron studies -Peripheral blood smear

## 2020-07-09 NOTE — Addendum Note (Signed)
Addended by: [name] on: 07/09/2020 09:11 AM   Modules accepted: Orders

## 2020-07-13 ENCOUNTER — Other Ambulatory Visit (INDEPENDENT_AMBULATORY_CARE_PROVIDER_SITE_OTHER): Payer: Medicaid Other

## 2020-07-13 DIAGNOSIS — R718 Other abnormality of red blood cells: Secondary | ICD-10-CM

## 2020-07-13 LAB — CBC
HCT: 42.7 % (ref 36.0–46.0)
Hemoglobin: 14.2 g/dL (ref 12.0–15.0)
MCH: 24 pg — ABNORMAL LOW (ref 26.0–34.0)
MCHC: 33.3 g/dL (ref 30.0–36.0)
MCV: 72.3 fL — ABNORMAL LOW (ref 80.0–100.0)
Platelets: 460 10*3/uL — ABNORMAL HIGH (ref 150–400)
RBC: 5.91 MIL/uL — ABNORMAL HIGH (ref 3.87–5.11)
RDW: 14.9 % (ref 11.5–15.5)
WBC: 6.1 10*3/uL (ref 4.0–10.5)
nRBC: 0 % (ref 0.0–0.2)

## 2020-07-13 NOTE — Addendum Note (Signed)
Addended by: Truddie Crumble on: 07/13/2020 08:47 AM   Modules accepted: Orders

## 2020-07-14 LAB — IRON AND TIBC
Iron Saturation: 16 % (ref 15–55)
Iron: 63 ug/dL (ref 27–159)
Total Iron Binding Capacity: 402 ug/dL (ref 250–450)
UIBC: 339 ug/dL (ref 131–425)

## 2020-07-14 LAB — PATHOLOGIST SMEAR REVIEW

## 2020-07-14 LAB — FERRITIN: Ferritin: 72 ng/mL (ref 15–150)

## 2020-07-20 ENCOUNTER — Encounter: Payer: Self-pay | Admitting: Internal Medicine

## 2020-07-20 NOTE — Addendum Note (Signed)
Addended by: [name] on: 07/20/2020 12:33 PM   Modules accepted: Orders

## 2020-07-20 NOTE — Addendum Note (Signed)
Addended by: [name] on: 07/20/2020 05:30 PM   Modules accepted: Orders

## 2020-07-20 NOTE — Assessment & Plan Note (Signed)
Blood smear showed microcytosis, target cells, polychromasia, few burr cells, rare schistocytes, thrombocytosis.  Repeat CBC showed RBC 5.9, MCV 72, platelets 460.  Ferritin iron iron studies negative for iron deficiency anemia.  Contacted patient regarding results.  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  Discussed blood smear shows nonspecific findings, a possible cause includes thalassemia, also have some findings concerning for a hemolytic anemia.  Will obtain hemoglobin electrophoresis and DAT for further work up.

## 2020-07-22 ENCOUNTER — Encounter: Payer: Self-pay | Admitting: Internal Medicine

## 2020-07-27 ENCOUNTER — Telehealth: Payer: Self-pay

## 2020-07-27 NOTE — Telephone Encounter (Signed)
Called pt - stated no one had called her back for return lab appt. Also stated she wants to be checked for STD. Informed she will need an appt to see a doctor- she's agreeable. Call transferred to front office - appt schedule w/Dr Bridgett Larsson on 5/26 @ 0845 AM.

## 2020-07-27 NOTE — Telephone Encounter (Signed)
Pls contact pt [phone] regarding blood work

## 2020-07-29 NOTE — Progress Notes (Signed)
   CC: STI concern  HPI:  [name] is a 35 y.o. female with history as below presenting for concern about STI. Please refer to problem based charting for further details of assessment and plan of current problem and chronic medical conditions.  Past Medical History:  Diagnosis Date  . Alcohol abuse   . Anemia   . Asthma    rarely uses inhaler, pt. reports she had out grown it  . Bronchitis   . 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 Dysmenorrhea 03/28/2019  . Fibroid   . GERD (gastroesophageal reflux disease)   . Hypertension    during post preg.  no problems now  . Hypertensive retinopathy    OU  . Menorrhagia 11/01/2018  . Missed ab    x 2, one requiring D & E  . Pancreatic cyst   . Pancreatitis    diet controlled  . Pelvic inflammatory disease   . Plantar fasciitis   . Pneumonia    h/o of in her teens  . Polycystic ovary syndrome   . 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 Renal cyst    scarring on the kidney  . Sickle cell trait ([place])    Review of Systems:   Review of Systems  Constitutional: Negative for chills and fever.  Genitourinary:       Positive for vaginal discharge and itching  All other systems reviewed and are negative.    Physical Exam: Vitals:   07/30/20 0851  BP: (!) 132/93  Pulse: 90  Temp: 98.4 F (36.9 C)  TempSrc: Oral  SpO2: 100%  Weight: 163 lb 1.6 oz (74 kg)  Height: 5' (1.524 m)   Constitutional: no acute distress Head: atraumatic ENT: external ears normal Cardiovascular: regular rate and rhythm, normal heart sounds Pulmonary: effort normal, normal breath sounds bilaterally Abdominal: flat, nontender, no rebound tenderness, bowel sounds normal Skin: warm and dry Neurological: alert, no focal deficit Psychiatric: normal mood and affect  Assessment & Plan:   See Encounters Tab for problem based charting.  Patient discussed with Dr. Philipp Ovens

## 2020-07-30 ENCOUNTER — Other Ambulatory Visit: Payer: Self-pay

## 2020-07-30 ENCOUNTER — Encounter: Payer: Self-pay | Admitting: Student

## 2020-07-30 ENCOUNTER — Ambulatory Visit: Payer: Medicaid Other | Admitting: Student

## 2020-07-30 ENCOUNTER — Encounter: Payer: Self-pay | Admitting: Dietician

## 2020-07-30 ENCOUNTER — Other Ambulatory Visit (HOSPITAL_COMMUNITY)
Admission: RE | Admit: 2020-07-30 | Discharge: 2020-07-30 | Disposition: A | Discharge status: Home / Self Care | Payer: Medicaid Other | Source: Ambulatory Visit | Attending: Internal Medicine | Admitting: Internal Medicine

## 2020-07-30 VITALS — BP 132/93 | HR 90 | Temp 98.4°F | Ht 60.0 in | Wt 163.1 lb

## 2020-07-30 DIAGNOSIS — N898 Other specified noninflammatory disorders of vagina: Secondary | ICD-10-CM | POA: Insufficient documentation

## 2020-07-30 DIAGNOSIS — Z7251 High risk heterosexual behavior: Secondary | ICD-10-CM

## 2020-07-30 DIAGNOSIS — B9689 Other specified bacterial agents as the cause of diseases classified elsewhere: Secondary | ICD-10-CM

## 2020-07-30 DIAGNOSIS — E559 Vitamin D deficiency, unspecified: Secondary | ICD-10-CM

## 2020-07-30 DIAGNOSIS — R718 Other abnormality of red blood cells: Secondary | ICD-10-CM | POA: Diagnosis not present

## 2020-07-30 DIAGNOSIS — N760 Acute vaginitis: Secondary | ICD-10-CM

## 2020-07-30 MED ORDER — VITAMIN D 25 MCG (1000 UNIT) PO TABS: 1000.0000 [IU] | ORAL_TABLET | Freq: Every day | ORAL | 1 refills | Status: AC

## 2020-07-30 NOTE — Assessment & Plan Note (Addendum)
We discussed her microcytic anemia further today.  At last visit, she was found to be microcytic and the provider was concerned about thalassemia versus effective for splenectomy.    -Follow-up hemoglobinopathy fractionation cascade - Follow-up direct anti-globulin test

## 2020-07-30 NOTE — Assessment & Plan Note (Signed)
Patient has completed her course of weekly vitamin D 50,000 units.  Last level was checked 1 month ago.  - Start vitamin D 1000 units daily. - Recheck vitamin D level in 2 to 3 months

## 2020-07-30 NOTE — Addendum Note (Signed)
Addended by: Truddie Crumble on: 07/30/2020 09:41 AM   Modules accepted: Orders

## 2020-07-30 NOTE — Patient Instructions (Addendum)
Thank you for allowing [place] to be a part of your care today, it was a pleasure seeing you. We discussed your possible thalassemia and your STD testing  I am checking these labs: wet prep for gonorrhea, chlamydia, yeast, and BV. HIV blood test. Hemoglobin testing  I have made these changes to your medications: START vitamin D, 1000 units daily  Please follow up in 3 months to recheck vitamin D and blood pressure  Please schedule an appointment with Dr. [name] (([phone]) for your 1 year fu     Thank you, and please call the Internal Medicine Clinic at [phone] if you have any questions.  Best, Dr. Bridgett Larsson

## 2020-07-30 NOTE — Assessment & Plan Note (Addendum)
Patient states was having sex with her boyfriend about 1 week ago when the condom broke.  She normally uses protection.  She has been in a monogamous relationship with him for the past several years.  Is not aware of any other partners that he has.  No other risk factors for STIs.  She has had some itching and burning since then.  Noted to have had a bacterial vaginosis diagnosis 1 month ago for which she completed treatment.  States she has had this frequently since her hysterectomy.  - Wet mount for gonorrhea, chlamydia, yeast, trichomonas - HIV test - Counseled on safe sex practices  Addendum: Positive for yeast and bacterial vaginosis.  Patient reports having recurrent episodes of acute vaginosis ever since her hysterectomy.  Has only 1 partner, does not douche or otherwise wash internally, always uses protection.  Unsure why she is getting recurrent infections. - Start metronidazole 500 mg twice daily for 7 days - Start fluconazole 150 mg once - Patient will follow up with her OB/GYN

## 2020-07-31 LAB — CERVICOVAGINAL ANCILLARY ONLY
Bacterial Vaginitis (gardnerella): POSITIVE — AB
Candida Glabrata: NEGATIVE
Candida Vaginitis: POSITIVE — AB
Chlamydia: NEGATIVE
Comment: NEGATIVE
Comment: NEGATIVE
Comment: NEGATIVE
Comment: NEGATIVE
Comment: NEGATIVE
Comment: NORMAL
Neisseria Gonorrhea: NEGATIVE
Trichomonas: NEGATIVE

## 2020-08-01 ENCOUNTER — Encounter: Payer: Self-pay | Admitting: *Deleted

## 2020-08-04 LAB — HGB SOLUBILITY: Hgb Solubility: POSITIVE — AB

## 2020-08-04 LAB — HGB FRACTIONATION CASCADE
Hgb A2: 3.9 % — ABNORMAL HIGH (ref 1.8–3.2)
Hgb A: 69.3 % — ABNORMAL LOW (ref 96.4–98.8)
Hgb F: 0 % (ref 0.0–2.0)
Hgb S: 26.8 % — ABNORMAL HIGH

## 2020-08-04 LAB — HIV ANTIBODY (ROUTINE TESTING W REFLEX): HIV Screen 4th Generation wRfx: NONREACTIVE

## 2020-08-04 LAB — DIRECT ANTIGLOBULIN TEST (NOT AT ARMC): Coombs', Direct: NEGATIVE

## 2020-08-04 MED ORDER — METRONIDAZOLE 500 MG PO TABS: 500.0000 mg | ORAL_TABLET | Freq: Two times a day (BID) | ORAL | 0 refills | Status: AC

## 2020-08-04 MED ORDER — FLUCONAZOLE 150 MG PO TABS: 150.0000 mg | ORAL_TABLET | Freq: Every day | ORAL | 0 refills | Status: DC

## 2020-08-04 NOTE — Addendum Note (Signed)
Addended by: Andrew Au on: 08/04/2020 08:36 AM   Modules accepted: Orders

## 2020-08-05 ENCOUNTER — Other Ambulatory Visit: Payer: Self-pay | Admitting: Internal Medicine

## 2020-08-05 DIAGNOSIS — R718 Other abnormality of red blood cells: Secondary | ICD-10-CM

## 2020-08-05 NOTE — Assessment & Plan Note (Signed)
Contacted patient regarding Hgb electrophoresis results.  Hgb A 69, Hgb A2 3.9, Hgb S 26, positive Hgb solubility. Coombs test negative.  Hgb S is observed at a concentration less than the interpretation range which suggests: transfusion of a homozygous sickle cell patient, sickle trait with alpha-thalassemia, or sickle trait with iron deficiency anemia. She also reports that her sister has a thalassemia.  Advised that she likely will need genetic testing done to further evaluate this, discussed referral to hematology. She was agreeable to this. -Hematology referral

## 2020-08-05 NOTE — Progress Notes (Deleted)
Contacted patient regarding Hgb electrophoresis results.  Hgb A 69, Hgb A2 3.9, Hgb S 26, positive Hgb solubility. Coombs test negative.  Hgb S is observed at a concentration less than the interpretation range which suggests: transfusion of a homozygous sickle cell patient, sickle trait with alpha-thalassemia, or sickle trait with iron deficiency anemia. She also reports that her sister has a thalassemia.  Advised that she likely will need genetic testing done to further evaluate this, discussed referral to hematology. She was agreeable to this. -Hematology referral

## 2020-08-06 ENCOUNTER — Telehealth: Payer: Self-pay | Admitting: Physician Assistant

## 2020-08-06 NOTE — Telephone Encounter (Signed)
Received a new hem referral from Dr. Boris Lown for microcytosis. [name] has been cld and schedule to see [name] on 6/8 at 9am. Pt aware to arrive 20 minutes early.

## 2020-08-07 NOTE — Progress Notes (Signed)
[facility] Medicine Clinic Attending  Case discussed with Dr. Chen  At the time of the visit.  We reviewed the resident's history and exam and pertinent patient test results.  I agree with the assessment, diagnosis, and plan of care documented in the resident's note. 

## 2020-08-12 ENCOUNTER — Inpatient Hospital Stay: Payer: Medicaid Other

## 2020-08-12 ENCOUNTER — Telehealth: Payer: Self-pay | Admitting: Physician Assistant

## 2020-08-12 ENCOUNTER — Inpatient Hospital Stay: Payer: Medicaid Other | Admitting: Physician Assistant

## 2020-08-12 NOTE — Telephone Encounter (Signed)
Rescheduled appointment per provider. Left message with new appointment time and date.

## 2020-08-20 NOTE — Progress Notes (Signed)
[place] [phone]   [phone] [place] NOTE  Patient Care Team: [name], MD as PCP - General (Internal Medicine)  Hematological/Oncological History 1) Labs from PCP, Dr. Lonia Skinner -07/13/2020: WBC 6.1, Hgb 14.2, MCV 72.3 (L), Plt 460 (H), Ferritin 72, TIBC 402, Iron 63, Iron Saturation 16% (L) -07/30/2020: Hgb electrophoresis-Hgb F 0.0, Hgb A 69.3% (L), Hgb A2 3.9% (H), Hgb S 26.8% (H). Hgb solubility was positive. Results indicate sickle cell trait (heterozygous).  2) 08/21/2020: Establish care with Dede Query PA-C  CHIEF COMPLAINTS/PURPOSE OF CONSULTATION:  "Sickle Cell Trait "  HISTORY OF PRESENTING ILLNESS:  Amelianna Y Cespedes 35 y.o. 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   On exam today, Ms. Shankar reports that she is chronically fatigued. She is able complete her ADLs but admits that she requires frequent resting and napping. She has a good appetite without any noticeable weight changes. She has acid reflux and indigestion, now taking protonix twice a day. She has intermittent episodes of nausea without any vomiting. Patient reports occasional episodes of mid abdominal pain due to chronic pancreatitis. She manages her abdominal pain with bowel rest and tries to avoid taking pain medications. Patient has regular bowel movements without any diarrhea or constipation. Patient denies easy bruising or signs of bleeding except for intermittent episodes of gum bleeding while brushing. Patient has night sweats for the past year. She denies any fevers, chills, shortness of breath, chest pain or cough. She has no other complaints. Rest of the 10 point ROS is below.   MEDICAL HISTORY:  Past Medical History:  Diagnosis Date   Alcohol abuse    Anemia    Asthma    rarely uses inhaler,  pt. reports she had out grown it   Bronchitis    Diabetes mellitus without complication ([place])    partial pancreatectomy 2012-glyburide   Dysmenorrhea 03/28/2019   Fibroid    GERD (gastroesophageal reflux disease)    Hypertension    during post preg.  no problems now   Hypertensive retinopathy    OU   Menorrhagia 11/01/2018   Missed ab    x 2, one requiring D & E   Pancreatic cyst    Pancreatitis    diet controlled   Pelvic inflammatory disease    Plantar fasciitis    Pneumonia    h/o of in her teens   Polycystic ovary syndrome    Post partum depression 05/01/2018   Renal cyst    scarring on the kidney   Sickle cell trait ([place])     SURGICAL HISTORY: Past Surgical History:  Procedure Laterality Date   CESAREAN SECTION N/A 05/23/2012   Procedure: CESAREAN SECTION;  Surgeon: Lahoma Crocker, MD;  Location: [facility];  Service: Obstetrics;  Laterality: N/A;  primary   CESAREAN SECTION N/A 04/18/2014   Procedure: REPEAT CESAREAN SECTION;  Surgeon: Shelly Bombard, MD;  Location: [facility];  Service: Obstetrics;  Laterality: N/A;   CESAREAN SECTION N/A 11/05/2017   Procedure: REPEAT CESAREAN SECTION;  Surgeon: Woodroe Mode, MD;  Location: [facility];  Service: Obstetrics;  Laterality: N/A;   CYSTOSCOPY N/A 06/18/2019   Procedure: CYSTOSCOPY;  Surgeon: Aletha Halim, MD;  Location: [facility];  Service: Gynecology;  Laterality: N/A;   DILATION AND CURETTAGE OF UTERUS      for MAB   ESOPHAGOGASTRODUODENOSCOPY (EGD) WITH PROPOFOL  N/A 05/16/2019   Procedure: ESOPHAGOGASTRODUODENOSCOPY (EGD) WITH PROPOFOL;  Surgeon: Milus Banister, MD;  Location: [facility];  Service: Endoscopy;  Laterality: N/A;   EUS N/A 06/11/2015   Procedure: UPPER ENDOSCOPIC ULTRASOUND (EUS) RADIAL;  Surgeon: Milus Banister, MD;  Location: [facility];  Service: Endoscopy;  Laterality: N/A;   EUS N/A 05/16/2019   Procedure: UPPER ENDOSCOPIC ULTRASOUND (EUS) RADIAL;  Surgeon: Milus Banister, MD;  Location: [facility];  Service: Endoscopy;  Laterality: N/A;   PANCREAS SURGERY  2012   s/p partial pancreatectomy   SPLENECTOMY, TOTAL  2012   TOTAL LAPAROSCOPIC HYSTERECTOMY WITH SALPINGECTOMY Bilateral 06/18/2019   Procedure: TOTAL LAPAROSCOPIC HYSTERECTOMY WITH SALPINGECTOMY;  Surgeon: Aletha Halim, MD;  Location: [facility];  Service: Gynecology;  Laterality: Bilateral;   TUBAL LIGATION  11/05/2017   WISDOM TOOTH EXTRACTION      SOCIAL HISTORY: Social History   Socioeconomic History   Marital status: Single    Spouse name: Not on file   Number of children: 2   Years of education: Not on file   Highest education level: Not on file  Occupational History   Occupation: Homemaker  Tobacco Use   Smoking status: Every Day    Packs/day: 0.30    Years: 13.00    Pack years: 3.90    Types: Cigarettes   Smokeless tobacco: Never   Tobacco comments:    1/3 pk per day   Vaping Use   Vaping Use: Never used  Substance and Sexual Activity   Alcohol use: No    Alcohol/week: 0.0 standard drinks    Comment: social    Drug use: No   Sexual activity: Not Currently    Partners: Male    Birth control/protection: None  Other Topics Concern   Not on file  Social History Narrative   Not on file   Social Determinants of Health   Financial Resource Strain: Not on file  Food Insecurity: Not on file  Transportation Needs: Not on file  Physical Activity: Not on file  Stress: Not on file  Social Connections: Not on file  Intimate Partner Violence: Not on file    FAMILY HISTORY: Family History  Adopted: Yes  Problem Relation Age of Onset   Diabetes Mother    Hypertension Mother    Asthma Father    Glaucoma Sister    Thalassemia Sister    Glaucoma Paternal Grandmother    Breast cancer Paternal Grandmother 48   Colon cancer Neg Hx     ALLERGIES:  is allergic to dilaudid [hydromorphone hcl].  MEDICATIONS:  Current Outpatient Medications  Medication Sig Dispense Refill   cholecalciferol  (VITAMIN D3) 25 MCG (1000 UNIT) tablet Take 1 tablet (1,000 Units total) by mouth daily. 90 tablet 1   CREON 36000 units CPEP capsule TAKE 2 CAPSULES (72,000 UNITS) BY MOUTH 3 TIMES DAILY WITH MEALS AND 1 CAPSULE WITH SNACK (Patient taking differently: Take 36,000-72,000 Units by mouth See admin instructions. Take 72000 units by mouth three times daily with meals and 36000 mg with a snack) 320 capsule 3   fluconazole (DIFLUCAN) 150 MG tablet Take 1 tablet (150 mg total) by mouth daily. 1 tablet 0   ibuprofen (ADVIL) 600 MG tablet Take 1 tablet (600 mg total) by mouth every 6 (six) hours as needed. (Patient not taking: Reported on 08/14/2019) 30 tablet 0   metFORMIN (GLUCOPHAGE) 500 MG tablet Take 1 tablet (500 mg total) by mouth 2 (two) times daily. 60 tablet 5  pantoprazole (PROTONIX) 40 MG tablet Take 1 tablet (40 mg total) by mouth daily. (Patient taking differently: Take 40 mg by mouth every evening. ) 60 tablet 3   No current facility-administered medications for this visit.    REVIEW OF SYSTEMS:   Constitutional: ( - ) fevers, ( - )  chills , (+ ) night sweats Eyes: ( + ) blurriness of vision, ( - ) double vision, ( - ) watery eyes Ears, nose, mouth, throat, and face: ( - ) mucositis, ( - ) sore throat Respiratory: ( - ) cough, ( - ) dyspnea, ( - ) wheezes Cardiovascular: ( - ) palpitation, ( - ) chest discomfort, ( - ) lower extremity swelling Gastrointestinal:  ( + ) nausea, ( - ) heartburn, ( - ) change in bowel habits Skin: ( - ) abnormal skin rashes Lymphatics: ( - ) new lymphadenopathy, ( - ) easy bruising Neurological: ( - ) numbness, ( - ) tingling, ( - ) new weaknesses Behavioral/Psych: ( - ) mood change, ( - ) new changes  All other systems were reviewed with the patient and are negative.  PHYSICAL EXAMINATION: ECOG PERFORMANCE STATUS: 1 - Symptomatic but completely ambulatory  Vitals:   08/21/20 0901  BP: (!) 132/103  Pulse: 88  Resp: 18  Temp: (!) 97.5 F (36.4 C)   SpO2: 100%   Filed Weights   08/21/20 0901  Weight: 159 lb 6.4 oz (72.3 kg)    GENERAL: well appearing African American female in NAD  SKIN: skin color, texture, turgor are normal, no rashes or significant lesions EYES: conjunctiva are pink and non-injected, sclera clear OROPHARYNX: no exudate, no erythema; lips, buccal mucosa, and tongue normal  NECK: supple, non-tender LYMPH:  no palpable lymphadenopathy in the cervical, axillary or supraclavicular lymph nodes.  LUNGS: clear to auscultation and percussion with normal breathing effort HEART: regular rate & rhythm and no murmurs and no lower extremity edema ABDOMEN: soft, non-tender, non-distended, normal bowel sounds Musculoskeletal: no cyanosis of digits and no clubbing  PSYCH: alert & oriented x 3, fluent speech NEURO: no focal motor/sensory deficits  LABORATORY DATA:  I have reviewed the data as listed CBC Latest Ref Rng & Units 08/21/2020 07/13/2020 07/06/2020  WBC 4.0 - 10.5 K/uL 7.2 6.1 7.5  Hemoglobin 12.0 - 15.0 g/dL 13.7 14.2 14.2  Hematocrit 36.0 - 46.0 % 40.7 42.7 43.5  Platelets 150 - 400 K/uL 421(H) 460(H) 426    CMP Latest Ref Rng & Units 06/30/2020 06/12/2019 03/07/2019  Glucose 65 - 99 mg/dL 144(H) 111(H) 122(H)  BUN 6 - 20 mg/dL 8 9 9   Creatinine 0.57 - 1.00 mg/dL 0.62 0.63 0.64  Sodium 134 - 144 mmol/L 141 139 137  Potassium 3.5 - 5.2 mmol/L 4.9 4.0 3.9  Chloride 96 - 106 mmol/L 102 107 104  CO2 20 - 29 mmol/L 23 25 26   Calcium 8.7 - 10.2 mg/dL 9.2 9.0 9.2  Total Protein 6.5 - 8.1 g/dL - 7.1 7.6  Total Bilirubin 0.3 - 1.2 mg/dL - 0.4 0.4  Alkaline Phos 38 - 126 U/L - 61 58  AST 15 - 41 U/L - 20 15  ALT 0 - 44 U/L - 14 10    ASSESSMENT & PLAN Kathy Y Nyborg is a 35 y.o. female presenting to the clinic for evaluation for sickle cell trait. I reviewed the outside labs from 07/13/2020 and 07/30/2020. CBC did not indicate anemia but iron panel did show iron saturation of 16%. Hemoglobin electrophoresis suggest  sickle cell trait +/- thalassemia or iron deficiency anemia. I reviewed with Ms. Lazenby that there is no long term complications for having the sickle cell trait. She informed me that her husband does have the sickle cell trait along with two of her children. I recommend additional labs today to further evaluation for iron deficiency anemia or additional hemoglobinopathies. Labs today will check CBC, CMP, Save Smear, Iron and TIBC, Ferritin and Retic Panel.   #Sickle Cell Trait, Heterozygous: -Generally asymptomatic carrier trait and any symptoms would be managed with supportive care.  -Recent CBC from 07/13/2020 does not indicate anemia.  -Hemoglobin electrophoresis did suggest a component of thalassemia versus iron deficiency anemia.  -Recommend labs today to further evaluate with CBC, CMP, Save Smear, Iron and TIBC, Ferritin and Retic Panel.  -RTC as needed or if above workup requires intervention.   #Hypertension: -Today's BP was 132/103, elevated but stable over the past several weeks. -Patient is not on any antihypertensive medications. -Patient reports headaches and some blurry vision. I advised patient to follow up with her PCP today and monitor her BP daily.  -If headache and/or blurry vision worsens, patient should be evaluated in the emergency room.   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  -Stable levels so continue to monitor.   Orders Placed This Encounter  Procedures   CBC with Differential ([facility] Only)    Standing Status:   Future    Number of Occurrences:   1    Standing Expiration Date:   08/20/2021   Save Smear ([facility])    Standing Status:   Future    Number of Occurrences:   1    Standing Expiration Date:   08/20/2021   Ferritin    Standing Status:   Future    Number of Occurrences:   1    Standing Expiration Date:   08/21/2021   Iron and TIBC    Standing Status:   Future    Number of Occurrences:   1    Standing Expiration Date:   08/21/2021    Retic Panel    Standing Status:   Future    Number of Occurrences:   1    Standing Expiration Date:   08/21/2021    All questions were answered. The patient knows to call the clinic with any problems, questions or concerns.  I have spent a total of 60 minutes minutes of face-to-face and non-face-to-face time, preparing to see the patient, obtaining and/or reviewing separately obtained history, performing a medically appropriate examination, counseling and educating the patient, ordering tests, documenting clinical information in the electronic health record, and care coordination.   Dede Query, PA-C Department of Hematology/Oncology [place] [facility] Phone: [phone]  Patient was seen with Dr. Lorenso Courier.   I have read the above note and personally examined the patient. I agree with the assessment and plan as noted above.  Briefly Mrs. Feliz is a 35 year old female who presents for evaluation of sickle cell trait with possible coexisting thalassemia.  At this time would recommend ordering iron studies in order to assure that there is not also an iron deficiency masked by her sickle cell trait.  In the event she is found to be iron deficient would recommend ferrous sulfate 325 mg daily and return to clinic in 3 months time.  Otherwise there are no interventions required for sickle cell trait.   Ledell Peoples, MD Department of Hematology/Oncology [facility] Phone: [phone] Pager: [phone] Email: [email or web address]

## 2020-08-21 ENCOUNTER — Telehealth: Payer: Self-pay | Admitting: *Deleted

## 2020-08-21 ENCOUNTER — Encounter: Payer: Self-pay | Admitting: Physician Assistant

## 2020-08-21 ENCOUNTER — Inpatient Hospital Stay: Payer: Medicaid Other | Attending: Physician Assistant | Admitting: Physician Assistant

## 2020-08-21 ENCOUNTER — Inpatient Hospital Stay: Payer: Medicaid Other

## 2020-08-21 ENCOUNTER — Emergency Department (HOSPITAL_COMMUNITY)
Admission: EM | Admit: 2020-08-21 | Discharge: 2020-08-22 | Disposition: A | Discharge status: Home / Self Care | Payer: Medicaid Other | Attending: Emergency Medicine | Admitting: Emergency Medicine

## 2020-08-21 ENCOUNTER — Other Ambulatory Visit: Payer: Self-pay

## 2020-08-21 VITALS — BP 132/103 | HR 88 | Temp 97.5°F | Resp 18 | Ht 60.0 in | Wt 159.4 lb

## 2020-08-21 DIAGNOSIS — Z803 Family history of malignant neoplasm of breast: Secondary | ICD-10-CM | POA: Insufficient documentation

## 2020-08-21 DIAGNOSIS — R161 Splenomegaly, not elsewhere classified: Secondary | ICD-10-CM | POA: Diagnosis not present

## 2020-08-21 DIAGNOSIS — D573 Sickle-cell trait: Secondary | ICD-10-CM

## 2020-08-21 DIAGNOSIS — Z5321 Procedure and treatment not carried out due to patient leaving prior to being seen by health care provider: Secondary | ICD-10-CM | POA: Diagnosis not present

## 2020-08-21 DIAGNOSIS — D75838 Other thrombocytosis: Secondary | ICD-10-CM | POA: Diagnosis not present

## 2020-08-21 DIAGNOSIS — F17210 Nicotine dependence, cigarettes, uncomplicated: Secondary | ICD-10-CM | POA: Insufficient documentation

## 2020-08-21 DIAGNOSIS — D75839 Thrombocytosis, unspecified: Secondary | ICD-10-CM | POA: Diagnosis not present

## 2020-08-21 DIAGNOSIS — I10 Essential (primary) hypertension: Secondary | ICD-10-CM | POA: Diagnosis present

## 2020-08-21 LAB — CBC WITH DIFFERENTIAL (CANCER CENTER ONLY)
Abs Immature Granulocytes: 0.02 10*3/uL (ref 0.00–0.07)
Basophils Absolute: 0.1 10*3/uL (ref 0.0–0.1)
Basophils Relative: 1 %
Eosinophils Absolute: 0.1 10*3/uL (ref 0.0–0.5)
Eosinophils Relative: 2 %
HCT: 40.7 % (ref 36.0–46.0)
Hemoglobin: 13.7 g/dL (ref 12.0–15.0)
Immature Granulocytes: 0 %
Lymphocytes Relative: 24 %
Lymphs Abs: 1.7 10*3/uL (ref 0.7–4.0)
MCH: 24 pg — ABNORMAL LOW (ref 26.0–34.0)
MCHC: 33.7 g/dL (ref 30.0–36.0)
MCV: 71.4 fL — ABNORMAL LOW (ref 80.0–100.0)
Monocytes Absolute: 0.6 10*3/uL (ref 0.1–1.0)
Monocytes Relative: 8 %
Neutro Abs: 4.7 10*3/uL (ref 1.7–7.7)
Neutrophils Relative %: 65 %
Platelet Count: 421 10*3/uL — ABNORMAL HIGH (ref 150–400)
RBC: 5.7 MIL/uL — ABNORMAL HIGH (ref 3.87–5.11)
RDW: 14.4 % (ref 11.5–15.5)
WBC Count: 7.2 10*3/uL (ref 4.0–10.5)
nRBC: 0 % (ref 0.0–0.2)

## 2020-08-21 LAB — FERRITIN: Ferritin: 45 ng/mL (ref 11–307)

## 2020-08-21 LAB — RETIC PANEL
Immature Retic Fract: 15.6 % (ref 2.3–15.9)
RBC.: 5.68 MIL/uL — ABNORMAL HIGH (ref 3.87–5.11)
Retic Count, Absolute: 50 10*3/uL (ref 19.0–186.0)
Retic Ct Pct: 0.9 % (ref 0.4–3.1)
Reticulocyte Hemoglobin: 28.3 pg (ref >27.9)

## 2020-08-21 LAB — IRON AND TIBC
Iron: 76 ug/dL (ref 28–170)
Saturation Ratios: 18 % (ref 10.4–31.8)
TIBC: 421 ug/dL (ref 250–450)
UIBC: 345 ug/dL

## 2020-08-21 LAB — SAVE SMEAR(SSMR), FOR PROVIDER SLIDE REVIEW

## 2020-08-21 NOTE — Telephone Encounter (Signed)
I agree

## 2020-08-21 NOTE — Telephone Encounter (Signed)
Patient called in stating she was seen at Hematology today and diastolic BP was 177. Also, c/o H/A and blurry vision. She is not prescribed any BP meds. Explained with these sx she should be evaluated immediately in ED. She was in agreement.

## 2020-08-21 NOTE — ED Notes (Signed)
Pt stated the since her husband cant wit with her and when I took her blood pressure she said it looked fine that she is going to leave.  Pt then walked out

## 2020-08-22 MED ORDER — PEGFILGRASTIM-CBQV 6 MG/0.6ML ~~LOC~~ SOSY
PREFILLED_SYRINGE | SUBCUTANEOUS | Status: AC
  Filled 2020-08-22: qty 0.6

## 2020-08-23 DIAGNOSIS — D573 Sickle-cell trait: Secondary | ICD-10-CM | POA: Insufficient documentation

## 2020-08-24 ENCOUNTER — Ambulatory Visit: Payer: Medicaid Other | Admitting: Internal Medicine

## 2020-08-24 ENCOUNTER — Encounter: Payer: Self-pay | Admitting: Internal Medicine

## 2020-08-24 VITALS — BP 130/95 | HR 85 | Temp 98.1°F | Wt 163.2 lb

## 2020-08-24 DIAGNOSIS — I10 Essential (primary) hypertension: Secondary | ICD-10-CM | POA: Diagnosis not present

## 2020-08-24 MED ORDER — AMLODIPINE BESYLATE 5 MG PO TABS: 5.0000 mg | ORAL_TABLET | Freq: Every day | ORAL | 0 refills | Status: DC

## 2020-08-24 NOTE — Patient Instructions (Signed)
To [name],   It was a pleasure meeting you today! Today we discussed your elevated blood pressure. We will start you on a medication today called amlodipine. Please take one pill a day. Common side effects of this medication are lower limb swelling and dizziness. If you experience dizziness please use half a tablet. If you continue to have dizziness, please discontinue the medication and call the office. We will see you in a week for a blood pressure check, and to see if your headaches are alleviated.   We will also get baseline labs today to assess kidney function and electrolytes. We will see you in one week.  Celedonio Miyamoto, MD

## 2020-08-24 NOTE — Progress Notes (Signed)
   CC: BP check  HPI:  [name] is a 35 y.o. person, with a PMH noted below, who presents to the clinic for a BP check. To see the management of their acute and chronic conditions, please see the A&P note under the Encounters tab.   Past Medical History:  Diagnosis Date   Alcohol abuse    Anemia    Asthma    rarely uses inhaler, pt. reports she had out grown it   Bronchitis    Diabetes mellitus without complication ([place])    partial pancreatectomy 2012-glyburide   Dysmenorrhea 03/28/2019   Fibroid    GERD (gastroesophageal reflux disease)    Hypertension    during post preg.  no problems now   Hypertensive retinopathy    OU   Menorrhagia 11/01/2018   Missed ab    x 2, one requiring D & E   Pancreatic cyst    Pancreatitis    diet controlled   Pelvic inflammatory disease    Plantar fasciitis    Pneumonia    h/o of in her teens   Polycystic ovary syndrome    Post partum depression 05/01/2018   Renal cyst    scarring on the kidney   Sickle cell trait ([place])    Review of Systems:   Review of Systems  Constitutional:  Negative for chills, fever, malaise/fatigue and weight loss.  Eyes:  Positive for blurred vision.  Cardiovascular:  Negative for chest pain, palpitations, orthopnea and claudication.  Gastrointestinal:  Negative for abdominal pain, constipation, diarrhea, nausea and vomiting.  Musculoskeletal:  Positive for neck pain. Negative for back pain and myalgias.  Neurological:  Positive for headaches. Negative for dizziness, tingling and tremors.    Physical Exam:  Vitals:   08/24/20 1322  BP: (!) 130/95  Pulse: 85  Temp: 98.1 F (36.7 C)  TempSrc: Oral  SpO2: 100%  Weight: 163 lb 3.2 oz (74 kg)   Physical Exam Constitutional:      General: She is not in acute distress.    Appearance: She is not ill-appearing, toxic-appearing or diaphoretic.  Eyes:     General: No scleral icterus.    Extraocular Movements: Extraocular movements intact.      Pupils: Pupils are equal, round, and reactive to light. Pupils are equal.  Cardiovascular:     Rate and Rhythm: Normal rate and regular rhythm.     Heart sounds: Normal heart sounds. No murmur heard.   No friction rub. No gallop.  Pulmonary:     Effort: Pulmonary effort is normal. No respiratory distress.     Breath sounds: Normal breath sounds. No stridor. No wheezing.  Musculoskeletal:     Cervical back: Normal range of motion and neck supple. No rigidity.  Lymphadenopathy:     Cervical: No cervical adenopathy.  Neurological:     Mental Status: She is alert.     Assessment & Plan:   See Encounters Tab for problem based charting.  Patient discussed with Dr. Jimmye Norman

## 2020-08-25 ENCOUNTER — Encounter: Payer: Self-pay | Admitting: Internal Medicine

## 2020-08-25 DIAGNOSIS — I10 Essential (primary) hypertension: Secondary | ICD-10-CM | POA: Insufficient documentation

## 2020-08-25 LAB — BMP8+ANION GAP
Anion Gap: 14 mmol/L (ref 10.0–18.0)
BUN/Creatinine Ratio: 17 (ref 9–23)
BUN: 10 mg/dL (ref 6–20)
CO2: 23 mmol/L (ref 20–29)
Calcium: 9.5 mg/dL (ref 8.7–10.2)
Chloride: 99 mmol/L (ref 96–106)
Creatinine, Ser: 0.6 mg/dL (ref 0.57–1.00)
Glucose: 123 mg/dL — ABNORMAL HIGH (ref 65–99)
Potassium: 4.6 mmol/L (ref 3.5–5.2)
Sodium: 136 mmol/L (ref 134–144)
eGFR: 119 mL/min/{1.73_m2} (ref >59)

## 2020-08-25 NOTE — Assessment & Plan Note (Addendum)
Vitals with BMI 08/24/2020 08/21/2020 08/21/2020  Height - - 5\' 0"   Weight 163 lbs 3 oz - 159 lbs 6 oz  BMI 98.10 - 25.48  Systolic 628 241 753  Diastolic 95 90 010  Pulse 85 80 88    Patient presents to the clinic for hypertension with associated headaches. She states that her symptoms started shortly before her oncologist appointment. She continued to have headaches and when she arrived to her oncologist appointment her BP was found to be 130/103. She went to the ED and had her BP read 130/90 and she left. Her home readings were consistently elevated in the 130s, and her headache would improve when her pressures were <130. Her BP is usually below 120.   She states that she has been having headaches with increased home pressures. She categorizes her pain as band like, bright lights are aggravating. Tylenol helps alleviate her pain. She states that she had similar headaches when she had preeclampsia.   A/P:  Patient presents with uncontrolled hypertension with associated headache. Her headache is likely due to tension, but may be a mixed picture as there are features concerning for migraine. Her neuro exam is wnl, her visual fields are intact. She does not take OCP, estrogen products. She does smoke, but has stopped due to her headaches. Will defer imaging today.  Will prescribe amlodipine and have her follow closely in clinic. Gave strict return precautions.  - BMP - Norvasc 5 mg daily - Follow up in 1 week - Continue tylenol PRN

## 2020-08-26 ENCOUNTER — Telehealth: Payer: Self-pay | Admitting: Physician Assistant

## 2020-08-26 MED ORDER — FERROUS SULFATE 325 (65 FE) MG PO TBEC: 325.0000 mg | DELAYED_RELEASE_TABLET | Freq: Every day | ORAL | 3 refills | Status: AC

## 2020-08-26 NOTE — Telephone Encounter (Signed)
I called [name] to review labs from 08/21/2020.  Explained that there is a enzyme deficiency anemia with an iron saturation of 18%.  I recommended to initiate ferrous sulfate 325 mg tablets once a day.  Advised patient to take with a source of vitamin C and not to take at the same time with antacids.  I reviewed iron rich foods that she can incorporate in her diet including liver and red meat.  I sent a prescription of ferrous sulfate to her local pharmacy.  Patient will return to the clinic in 3 months with repeat labs.  Patient expressed understanding and satisfaction with the plan provided.

## 2020-09-02 ENCOUNTER — Telehealth: Payer: Self-pay | Admitting: *Deleted

## 2020-09-02 ENCOUNTER — Telehealth: Payer: Self-pay | Admitting: Physician Assistant

## 2020-09-02 NOTE — Telephone Encounter (Signed)
Sch per 6/22 los, pt aware

## 2020-09-02 NOTE — Telephone Encounter (Signed)
Patient called in stating she started amlodipine on 6/20 and feet began swelling. They are better now, however, breasts began swelling on 6/24 and are so painful she cannot tolerate putting a bra on. Instructed patient to stop amlodipine and appt given tomorrow AM at 0915. Will forward to Red Team to advise if there are any other directions.

## 2020-09-03 ENCOUNTER — Encounter: Payer: Self-pay | Admitting: Student

## 2020-09-03 ENCOUNTER — Ambulatory Visit (INDEPENDENT_AMBULATORY_CARE_PROVIDER_SITE_OTHER): Payer: Medicaid Other | Admitting: Student

## 2020-09-03 ENCOUNTER — Other Ambulatory Visit: Payer: Self-pay

## 2020-09-03 DIAGNOSIS — N644 Mastodynia: Secondary | ICD-10-CM

## 2020-09-03 DIAGNOSIS — I10 Essential (primary) hypertension: Secondary | ICD-10-CM

## 2020-09-03 MED ORDER — LOSARTAN POTASSIUM 25 MG PO TABS
25.0000 mg | ORAL_TABLET | Freq: Every day | ORAL | 0 refills | Status: DC
Start: 2020-09-03 — End: 2021-08-18

## 2020-09-03 NOTE — Progress Notes (Signed)
   CC: bilateral breast pain and swelling  HPI:  [name] is a 35 y.o. female with history below presenting for bilateral breast pain and swelling after starting amlodipine. Please refer to problem based charting for further details and assessment and plan of current problem and chronic medical conditions.   Past Medical History:  Diagnosis Date   Alcohol abuse    Anemia    Asthma    rarely uses inhaler, pt. reports she had out grown it   Bronchitis    Diabetes mellitus without complication ([place])    partial pancreatectomy 2012-glyburide   Dysmenorrhea 03/28/2019   Fibroid    GERD (gastroesophageal reflux disease)    Hypertension    during post preg.  no problems now   Hypertensive retinopathy    OU   Menorrhagia 11/01/2018   Missed ab    x 2, one requiring D & E   Pancreatic cyst    Pancreatitis    diet controlled   Pelvic inflammatory disease    Plantar fasciitis    Pneumonia    h/o of in her teens   Polycystic ovary syndrome    Post partum depression 05/01/2018   Renal cyst    scarring on the kidney   Sickle cell trait ([facility])    Review of Systems:  Negative as per HPI   Physical Exam:  Vitals:   09/03/20 0919  BP: (!) 130/95  Pulse: 86  Temp: 98.3 F (36.8 C)  TempSrc: Oral  SpO2: 100%  Weight: 71.6 kg  Height: 5' (1.524 m)   Physical Exam Constitutional:      Appearance: She is obese.  HENT:     Head: Normocephalic and atraumatic.     Mouth/Throat:     Mouth: Mucous membranes are moist.  Eyes:     Extraocular Movements: Extraocular movements intact.     Pupils: Pupils are equal, round, and reactive to light.  Cardiovascular:     Rate and Rhythm: Normal rate and regular rhythm.  Pulmonary:     Effort: Pulmonary effort is normal.     Breath sounds: Normal breath sounds.  Chest:  Breasts:    Breasts are symmetrical.     Right: Tenderness present. No mass, skin change, axillary adenopathy or supraclavicular adenopathy.     Left:  Tenderness present. No mass, skin change, axillary adenopathy or supraclavicular adenopathy.     Comments: Tenderness to palpation surrounding both areolas, no rash, no  erythema, no skin lesions  Abdominal:     General: Abdomen is flat.     Palpations: Abdomen is soft.  Musculoskeletal:     Right lower leg: No edema.     Left lower leg: No edema.  Lymphadenopathy:     Upper Body:     Right upper body: No supraclavicular or axillary adenopathy.     Left upper body: No supraclavicular or axillary adenopathy.  Skin:    General: Skin is warm and dry.  Neurological:     General: No focal deficit present.     Mental Status: She is alert and oriented to person, place, and time.  Psychiatric:        Mood and Affect: Mood normal.        Behavior: Behavior normal.     Assessment & Plan:   See Encounters Tab for problem based charting.  Patient discussed with Dr. Philipp Ovens

## 2020-09-03 NOTE — Assessment & Plan Note (Addendum)
Today's Vitals   09/03/20 0919 09/03/20 0923  BP: (!) 130/95   Pulse: 86   Temp: 98.3 F (36.8 C)   TempSrc: Oral   SpO2: 100%   Weight: 71.6 kg   Height: 5' (1.524 m)   PainSc:  7    BP elevated today. Stopped norvasc 2 days ago due to bilateral breast pain. State headache improved while on norvasc. Today she has mild constant frontal headache that started shortly after she discontinued BP medications. BP elevated today. She has been avoiding smoking and caffeine since last OV. State she took norvasc with preeclampsia in the past and was taken off, but cannot remember the reason and was switched to losartan which she tolerated.    AP - Start losartan 25 mg daily - Stop Amlodipine, gynecomastia is rare complication <9%, which may be causing her symptoms - Follow up BP in 1 month

## 2020-09-03 NOTE — Assessment & Plan Note (Addendum)
Patient presents for bilateral swelling and pain in the breasts. States this started about 1 week ago. State pain started in the bilateral areolas and gradually expanded. States she had to get a larger bra due to swelling. Pain is worse with pressure or weight on the breasts and better with laying down. Notes pain started about 2 days after starting Norvasc for hypertension. States she stopped medication 2 days ago. Since then pain has improved and swelling is a little better. Has been taking tylenol for pain and using ice pack with mild relief. She has a history of left fibroadenoma and adenomyosis sp hysterectomy in 2021 and family history for breast cancer.   Diffuse tenderness bilaterally on exam with skin changes, no rash, warmth or erythema. No masses or adenopathy palpable on exam. Gynecomastia is a rare side effect of norvasc <1% and may be contributing to her pain although this is unlikely. However as pain in improving today. If pain does not continue to improve off norvasc would likely need mammogram to evaluate.  Plan - stop amlodipine - follow up at telehealth visit in 1 week to assess improvement, consider imaging if not improving

## 2020-09-03 NOTE — Patient Instructions (Signed)
It was a pleasure seeing you in clinic. Today we discussed:   Breast pain: Please stop taking amlodipine, you can continue with ice packs and tylenol for pain. Please schedule a Telehealth visit in 1 week  to follow up on the pain.   Elevated BP: Please start losartan 25 mg daily for blood pressure and follow up in clinic in 1 month to follow up blood pressure.   If you have any questions or concerns, please call our clinic at [phone] between 9am-5pm and after hours call [phone] and ask for the internal medicine resident on call. If you feel you are having a medical emergency please call 911.   Thank you, we look forward to helping you remain healthy!

## 2020-09-04 NOTE — Progress Notes (Signed)
Internal Medicine Clinic Attending ? ?Case discussed with Dr. Liang  At the time of the visit.  We reviewed the resident?s history and exam and pertinent patient test results.  I agree with the assessment, diagnosis, and plan of care documented in the resident?s note. ? ?

## 2020-09-08 ENCOUNTER — Encounter: Payer: Self-pay | Admitting: *Deleted

## 2020-09-11 ENCOUNTER — Ambulatory Visit (INDEPENDENT_AMBULATORY_CARE_PROVIDER_SITE_OTHER): Payer: Medicaid Other | Admitting: Student

## 2020-09-11 DIAGNOSIS — N644 Mastodynia: Secondary | ICD-10-CM

## 2020-09-11 NOTE — Progress Notes (Signed)
  [facility] Internal Medicine Residency Telephone Encounter Continuity Care Appointment  HPI:  This telephone encounter was created for [name] on 09/11/2020 for the following purpose/cc follow-up breast pain.   Past Medical History:  Past Medical History:  Diagnosis Date   Alcohol abuse    Anemia    Asthma    rarely uses inhaler, pt. 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  As always, pt is advised that if symptoms worsen or new symptoms arise, they should go to an urgent care facility or to to ER for further evaluation.   Consent and Medical Decision Making:  Patient discussed with Dr. Angelia Mould This is a telephone encounter between [place] and [name] on 09/11/2020 for breast pain follow-up. The visit was conducted with the patient located at home and [name] at [facility]. The patient's identity was confirmed using their DOB and current address. The patient has consented to being evaluated through a telephone encounter and understands the associated risks (an examination cannot be done and the patient may need to come in for an appointment) / benefits (allows the patient  to remain at home, decreasing exposure to coronavirus). I personally spent 6 minutes on medical discussion.

## 2020-09-11 NOTE — Assessment & Plan Note (Signed)
Ms. Brunke is presenting today via telephone to discuss bilateral breast pain, for which she was evaluated in office one week ago. At that time there was suspicion that amlodipine was the causative agent and her antihypertensive was switched to losartan. Ms. Eber reports today that her pain has resolved since stopping amlodipine. Notes that she has not noticed any discharge, rashes, or changes in breast tissue since she was evaluated last week. We discussed that she will follow-up with [facility] in one month for blood pressure follow-up. Patient should also return if breast pain occurs again.  - Amlodipine added to allergy list - Follow-up in one month for blood pressure check

## 2020-09-21 NOTE — Progress Notes (Signed)
Internal Medicine Clinic Attending ? ?Case discussed with Dr. Braswell  At the time of the visit.  We reviewed the resident?s history and exam and pertinent patient test results.  I agree with the assessment, diagnosis, and plan of care documented in the resident?s note.  ?

## 2020-09-23 ENCOUNTER — Other Ambulatory Visit: Payer: Self-pay | Admitting: Internal Medicine

## 2020-10-13 DIAGNOSIS — M79671 Pain in right foot: Secondary | ICD-10-CM | POA: Diagnosis not present

## 2020-10-13 DIAGNOSIS — X58XXXA Exposure to other specified factors, initial encounter: Secondary | ICD-10-CM | POA: Diagnosis not present

## 2020-10-13 DIAGNOSIS — S99921A Unspecified injury of right foot, initial encounter: Secondary | ICD-10-CM | POA: Diagnosis not present

## 2020-11-25 ENCOUNTER — Other Ambulatory Visit: Payer: Self-pay | Admitting: Physician Assistant

## 2020-11-25 ENCOUNTER — Inpatient Hospital Stay: Payer: Medicaid Other | Admitting: Physician Assistant

## 2020-11-25 ENCOUNTER — Inpatient Hospital Stay: Payer: Medicaid Other | Attending: Internal Medicine

## 2020-11-25 DIAGNOSIS — D508 Other iron deficiency anemias: Secondary | ICD-10-CM

## 2020-11-26 ENCOUNTER — Telehealth: Payer: Self-pay | Admitting: Physician Assistant

## 2020-11-26 NOTE — Telephone Encounter (Signed)
R/s appt per 9/21 sch msg. Called pt, no answer. Left msg with appts date and times

## 2020-12-01 ENCOUNTER — Inpatient Hospital Stay: Payer: Medicaid Other

## 2020-12-01 ENCOUNTER — Inpatient Hospital Stay: Payer: Medicaid Other | Admitting: Physician Assistant

## 2020-12-11 ENCOUNTER — Inpatient Hospital Stay: Payer: Medicaid Other | Attending: Internal Medicine

## 2020-12-11 ENCOUNTER — Other Ambulatory Visit: Payer: Medicaid Other

## 2020-12-11 ENCOUNTER — Ambulatory Visit: Payer: Medicaid Other | Admitting: Physician Assistant

## 2020-12-16 ENCOUNTER — Inpatient Hospital Stay: Payer: Medicaid Other | Admitting: Physician Assistant

## 2021-01-05 DIAGNOSIS — K649 Unspecified hemorrhoids: Secondary | ICD-10-CM | POA: Diagnosis not present

## 2021-04-30 DIAGNOSIS — M79603 Pain in arm, unspecified: Secondary | ICD-10-CM | POA: Diagnosis not present

## 2021-04-30 DIAGNOSIS — M79605 Pain in left leg: Secondary | ICD-10-CM | POA: Diagnosis not present

## 2021-04-30 DIAGNOSIS — M79602 Pain in left arm: Secondary | ICD-10-CM | POA: Diagnosis not present

## 2021-04-30 DIAGNOSIS — M79662 Pain in left lower leg: Secondary | ICD-10-CM | POA: Diagnosis not present

## 2021-08-10 ENCOUNTER — Encounter: Payer: Medicaid Other | Admitting: Student

## 2021-08-13 ENCOUNTER — Encounter: Payer: Medicaid Other | Admitting: Student

## 2021-08-17 ENCOUNTER — Encounter: Payer: Medicaid Other | Admitting: Student

## 2021-08-18 ENCOUNTER — Other Ambulatory Visit: Payer: Self-pay

## 2021-08-18 ENCOUNTER — Other Ambulatory Visit (HOSPITAL_COMMUNITY)
Admission: RE | Admit: 2021-08-18 | Discharge: 2021-08-18 | Disposition: A | Discharge status: Home / Self Care | Payer: Medicaid Other | Source: Ambulatory Visit | Attending: Internal Medicine | Admitting: Internal Medicine

## 2021-08-18 ENCOUNTER — Ambulatory Visit: Payer: Medicaid Other | Admitting: Student

## 2021-08-18 ENCOUNTER — Encounter: Payer: Self-pay | Admitting: Student

## 2021-08-18 VITALS — BP 131/100 | HR 82 | Temp 98.3°F | Ht 60.0 in | Wt 179.7 lb

## 2021-08-18 DIAGNOSIS — Z90710 Acquired absence of both cervix and uterus: Secondary | ICD-10-CM | POA: Diagnosis not present

## 2021-08-18 DIAGNOSIS — B9689 Other specified bacterial agents as the cause of diseases classified elsewhere: Secondary | ICD-10-CM | POA: Insufficient documentation

## 2021-08-18 DIAGNOSIS — F17210 Nicotine dependence, cigarettes, uncomplicated: Secondary | ICD-10-CM | POA: Diagnosis not present

## 2021-08-18 DIAGNOSIS — R7303 Prediabetes: Secondary | ICD-10-CM | POA: Diagnosis not present

## 2021-08-18 DIAGNOSIS — N760 Acute vaginitis: Secondary | ICD-10-CM | POA: Insufficient documentation

## 2021-08-18 DIAGNOSIS — Z90411 Acquired partial absence of pancreas: Secondary | ICD-10-CM | POA: Diagnosis not present

## 2021-08-18 DIAGNOSIS — N644 Mastodynia: Secondary | ICD-10-CM | POA: Diagnosis not present

## 2021-08-18 DIAGNOSIS — F331 Major depressive disorder, recurrent, moderate: Secondary | ICD-10-CM | POA: Diagnosis not present

## 2021-08-18 DIAGNOSIS — E139 Other specified diabetes mellitus without complications: Secondary | ICD-10-CM

## 2021-08-18 DIAGNOSIS — B3731 Acute candidiasis of vulva and vagina: Secondary | ICD-10-CM

## 2021-08-18 DIAGNOSIS — K861 Other chronic pancreatitis: Secondary | ICD-10-CM

## 2021-08-18 DIAGNOSIS — I10 Essential (primary) hypertension: Secondary | ICD-10-CM

## 2021-08-18 DIAGNOSIS — E089 Diabetes mellitus due to underlying condition without complications: Secondary | ICD-10-CM

## 2021-08-18 LAB — GLUCOSE, CAPILLARY: Glucose-Capillary: 174 mg/dL — ABNORMAL HIGH (ref 70–99)

## 2021-08-18 LAB — POCT GLYCOSYLATED HEMOGLOBIN (HGB A1C): Hemoglobin A1C: 8.5 % — AB (ref 4.0–5.6)

## 2021-08-18 MED ORDER — PANCRELIPASE (LIP-PROT-AMYL) 36000-114000 UNITS PO CPEP: 36000.0000 [IU] | ORAL_CAPSULE | ORAL | 3 refills | Status: DC

## 2021-08-18 MED ORDER — LOSARTAN POTASSIUM 25 MG PO TABS: 25.0000 mg | ORAL_TABLET | Freq: Every day | ORAL | 5 refills | Status: AC

## 2021-08-18 MED ORDER — DULOXETINE HCL 30 MG PO CPEP: 30.0000 mg | ORAL_CAPSULE | Freq: Every day | ORAL | 2 refills | Status: AC

## 2021-08-18 NOTE — Progress Notes (Signed)
   CC: Left breast pain/pancreatitis pain/vaginal discharge  HPI:  [name] is a 36 y.o. female with PMH as below who presents to clinic for evaluation of pancreatitis pain, vaginal discharge and left breast pain Please see problem based charting for evaluation, assessment and plan.  Past Medical History:  Diagnosis Date   Alcohol abuse    Anemia    Asthma    rarely uses inhaler, pt. 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  Negative for polydipsia. Chest: Positive for left breast pain.  Negative for chest pain, nipple discharge or redness. MSK: Positive for back pain Abdomen: Positive for abdominal pain, oily stools, nausea and vomiting GU: Positive for vaginal discharge and odor. Negative for polyuria. Neuro: Negative for headache, numbness or weakness Psych: Positive for stress, anxiety and depression.  Physical Exam: General: Pleasant, well-appearing middle-aged woman.  No acute distress. HEENT: Neck supple.  No lymphadenopathy. Breast: Mild tenderness to palpation of the left axilla. Full breast exam deferred Cardiac: RRR. No murmurs, rubs or gallops. No LE edema Respiratory: Lungs CTAB. No wheezing or crackles. Abdominal: Soft, mild abdominal tenderness around the umbilicus. Mild  tenderness palpation of the low back. Nondistended. Normal bowel sounds. Skin: Warm, dry and intact without rashes or lesions Extremities: Atraumatic. Full ROM. Palpable radial and DP pulses. Neuro: A&O x 3. Moves all extremities. Normal sensation to gross touch. Psych: Depressed mood  Vitals:   08/18/21 1556 08/18/21 1630  BP: (!) 142/101 (!) 131/100  Pulse: 85 82  Temp: 98.3 F (36.8 C)   TempSrc: Oral   SpO2: 100% 100%  Weight: 179 lb 11.2 oz (81.5 kg)   Height: 5' (1.524 m)      Assessment & Plan:   See Encounters Tab for problem based charting.  Patient discussed with Dr. Lorenz Coaster, MD, MPH

## 2021-08-18 NOTE — Patient Instructions (Signed)
Thank you, Ms.Socorro Y Maday for allowing [place] to provide your care today. Today we discussed your pancreatitis pain, blood pressure, diabetes, depression.    For the depression, call [facility], [phone] to access their counseling and therapy.  Review blood pressure, I am restarting your losartan 25 mg daily  For the pancreatitis pain, I have refilled your Creon and started you on duloxetine for pain.  Please follow-up with the GI doctors when able.  I have ordered the following labs for you:   Lab Orders         CMP14 + Anion Gap         Amylase         Lipase         POC Hbg A1C      I will call if any are abnormal. All of your labs can be accessed through "My Chart".   I have ordered the following tests: Diagnostic mammogram and ultrasound  I have ordered the following medication/changed the following medications:  Start duloxetine (Cymbalta) 30 mg daily  My Chart Access: [email or web address]  Please follow-up in 1 to 2 weeks or as needed  Please make sure to arrive 15 minutes prior to your next appointment. If you arrive late, you may be asked to reschedule.    We look forward to seeing you next time. Please call our clinic at [phone] if you have any questions or concerns. The best time to call is Monday-Friday from 9am-4pm, but there is someone available 24/7. If after hours or the weekend, call the main hospital number and ask for the Internal Medicine Resident On-Call. If you need medication refills, please notify your pharmacy one week in advance and they will send [place] a request.   Thank you for letting us take part in your care. Wishing you the best!  Lacinda Axon, MD 08/18/2021, 4:45 PM IM Resident, PGY-2 [facility] 41:10

## 2021-08-19 LAB — CMP14 + ANION GAP
ALT: 25 IU/L (ref 0–32)
AST: 26 IU/L (ref 0–40)
Albumin/Globulin Ratio: 1.7 (ref 1.2–2.2)
Albumin: 4.8 g/dL (ref 3.8–4.8)
Alkaline Phosphatase: 94 IU/L (ref 44–121)
Anion Gap: 14 mmol/L (ref 10.0–18.0)
BUN/Creatinine Ratio: 11 (ref 9–23)
BUN: 7 mg/dL (ref 6–20)
Bilirubin Total: 0.3 mg/dL (ref 0.0–1.2)
CO2: 21 mmol/L (ref 20–29)
Calcium: 9.4 mg/dL (ref 8.7–10.2)
Chloride: 100 mmol/L (ref 96–106)
Creatinine, Ser: 0.61 mg/dL (ref 0.57–1.00)
Globulin, Total: 2.9 g/dL (ref 1.5–4.5)
Glucose: 165 mg/dL — ABNORMAL HIGH (ref 70–99)
Potassium: 4.2 mmol/L (ref 3.5–5.2)
Sodium: 135 mmol/L (ref 134–144)
Total Protein: 7.7 g/dL (ref 6.0–8.5)
eGFR: 118 mL/min/{1.73_m2} (ref >59)

## 2021-08-19 LAB — CERVICOVAGINAL ANCILLARY ONLY
Bacterial Vaginitis (gardnerella): NEGATIVE
Candida Glabrata: NEGATIVE
Candida Vaginitis: POSITIVE — AB
Chlamydia: NEGATIVE
Comment: NEGATIVE
Comment: NEGATIVE
Comment: NEGATIVE
Comment: NEGATIVE
Comment: NEGATIVE
Comment: NORMAL
Neisseria Gonorrhea: NEGATIVE
Trichomonas: NEGATIVE

## 2021-08-19 LAB — AMYLASE: Amylase: 29 U/L — ABNORMAL LOW (ref 31–110)

## 2021-08-19 LAB — LIPASE: Lipase: 13 U/L — ABNORMAL LOW (ref 14–72)

## 2021-08-20 ENCOUNTER — Encounter: Payer: Self-pay | Admitting: Student

## 2021-08-20 DIAGNOSIS — B3731 Acute candidiasis of vulva and vagina: Secondary | ICD-10-CM | POA: Insufficient documentation

## 2021-08-20 MED ORDER — PANCRELIPASE (LIP-PROT-AMYL) 36000-114000 UNITS PO CPEP: ORAL_CAPSULE | ORAL | 3 refills | Status: AC

## 2021-08-20 MED ORDER — METFORMIN HCL 500 MG PO TABS: ORAL_TABLET | ORAL | 5 refills | Status: DC

## 2021-08-20 MED ORDER — FLUCONAZOLE 150 MG PO TABS: 150.0000 mg | ORAL_TABLET | Freq: Every day | ORAL | 0 refills | Status: AC

## 2021-08-20 NOTE — Assessment & Plan Note (Signed)
Patient with a history of postpartum depression to have PHQ-9 of 11 today. Patient reports she has been dealing with a lot the past year.  She takes care of 3 children at home, 1 of which has autism and another with sickle cell trait.  Last year, her significant other, who was the primary breadwinner of the family was diagnosed with Crohn's disease.  She had to start working to help support the family. Over the year, they have had financial difficulties and different types of stressors.  Discussed starting patient on medication to help with depression, however patient hesitant about starting an antidepressive medication as she did not tolerate an SSRI in the past.  Discussed starting patient on duloxetine to help with both pain and mood.  Patient states she will think about it but would like to start with therapy.  Plan: -CBT with [facility], provided patient with their number. -Start the Cymbalta 30 mg daily -PHQ-9 at next office visit

## 2021-08-20 NOTE — Assessment & Plan Note (Addendum)
Patient with a history of chronic pancreatitis followed by GI at [facility]. Patient reports worsening back and abdominal pain similar to her previous pancreatitis flares. She also endorse associated nausea and vomiting.  States she has been consistent with her Creon. Over the last week she has tried liquid diet to help improve her symptoms. She called her GI provider who is unable to see her until later in July 2 patient was informed to see her PCP for further evaluation with lipase and amylase. Patient reports she was on Percocet years ago for her pain but this was discontinued and she was started on tramadol. Patient reported having her heart racing while taking tramadol so this was discontinued and she was started on gabapentin. Patient reports having some vivid dreams with gabapentin so she stopped taking the medication. Discussed with patient about starting duloxetine, however patient states she is hesitant about starting a new medication and will think about it. Labs today showed amylase slightly low at 29 and lipase slightly low at 13. CMP unremarkable. Plan to increase patient's Creon dose and have her follow-up with GI closely.  Plan: -Increase Creon from 2 tablets (72,000 units)  to 3 (108,000 units) tablets 3 times dailywith meals and from 1 (36,000 units) tablet to 2 (72,000 units) tablets with snacks -Instructed to continue to avoid fatty foods. -Instructed to follow-up with [facility], Dr. Hilarie Fredrickson. Have sent a secure chat to him so patient can be seen in the next few weeks.

## 2021-08-20 NOTE — Assessment & Plan Note (Addendum)
Patient reports taking off amlodipine last year due to possible gynecomastia and started on losartan.  However, patient states she did not take her losartan and started taking a natural supplement that was managing her blood pressure.  States due to some financial constraints she is now unable to afford the supplements.  Patient willing to go back on losartan.  BP still elevated today with SBP in the 140s.  Repeat slightly improved to the 130s.  CMP today shows normal kidney function and electrolytes. Vitals:   08/18/21 1556 08/18/21 1630  BP: (!) 142/101 (!) 131/100   Plan: -Restart losartan 25 mg daily -Follow-up in 2 weeks for repeat BMP and BP

## 2021-08-20 NOTE — Assessment & Plan Note (Addendum)
Patient with a history of well-controlled diabetes on metformin.  A1c today increased to 8.5% from 6.5% 1 year ago.  Patient reports that over the last year she has had significant life stressors mostly from family and finances.  Her diet has not been very healthy.  Patient's weight is up 22 lbs since June 2022.  She denies any polyuria, polydipsia, blurry vision or dizziness. Patient states she wants to work on getting her A1c down with exercising and eating better. Discussed with patient about adding a GLP-1 to her regimen to help with weight loss she states she has seen a commercial on TV and will consider this in the future.  Plan: -Increase metformin from 500 mg twice daily to 1000 mg in the a.m. and 500 mg in the p.m. -Continue lifestyle modifications with dietary changes and exercise -2 weeks follow-up for reevaluation and diabetic foot exam -Repeat A1c in 3 months, please discuss starting patient on Ozempic if agreeable

## 2021-08-20 NOTE — Assessment & Plan Note (Addendum)
Patient with a history of left breast pain here with worsening left breast pain. Patient has left breast pain was initially evaluated in June 2021. An ultrasound of the breast showed indeterminate left breast mass at the 2:30 position.  This was biopsied and pathology showed fibroadenoma. Patient had recurrence of the left breast pain in June 2022 that was thought to be due to her amlodipine. Patient's amlodipine was discontinued and she reported her pain had resolved. Patient reports that a week ago she started having worsening pain in her left breast. She denies any nipple discharge or rashes. Patient denies any loss of appetite, night sweats or weight loss.  Patient is actually up 22 pounds since last year.  Full breast exam was deferred however patient does have mild tenderness to palpation of the left axilla but no lymphadenopathy.  Plan: -Repeat diagnostic mammogram -Repeat U/S left breast and axilla

## 2021-08-20 NOTE — Assessment & Plan Note (Addendum)
Patient with a history of bacterial vaginosis and Candida vaginitis present with evaluation for vaginal discharge.  Patient reports that since she had her hysterectomy in 2021, she has had recurrent BVs and vaginal yeast infections. She has no history of STDs and sexually active only with her significant other. She does not douche.  Patient has skin 22 pounds over the last year with worsening of her diabetes. Ancillary urinary test positive for Candida. Patient informed that she will be at increased risk of recurrent yeast infections if her diabetes remains uncontrolled.  Patient referred to OB/GYN last year however she states she was unable to go because she started working at that time to help support her family after her husband was diagnosed with Crohn's.   Plan: -Repeat fluconazole 150 mg x 1 dose -Referral back to OB/GYN

## 2021-08-20 NOTE — Assessment & Plan Note (Addendum)
Patient had a total laparoscopic hysterectomy, bilateral salpingectomy, and cystoscopy in April 2021 for menorrhagia and adenomyosis with removal of the uterus, fallopian tubes and cervix. Surgical pathology showed the cervix with mild chronic cervicitis, uterus with proliferative endometrium and fallopian tube with no significant histopathological findings. Patient informed that she will not need any more pap smears. States that she is surprised all 3 were removed because she thought only her fallopian tubes were removed.  -Follow-up with OB/GYN as needed

## 2021-08-23 NOTE — Progress Notes (Signed)
Internal Medicine Clinic Attending  Case discussed with the resident at the time of the visit.  We reviewed the resident's history and exam and pertinent patient test results.  I agree with the assessment, diagnosis, and plan of care documented in the resident's note.  

## 2021-08-25 DIAGNOSIS — L089 Local infection of the skin and subcutaneous tissue, unspecified: Secondary | ICD-10-CM | POA: Diagnosis not present

## 2021-08-29 ENCOUNTER — Encounter: Payer: Self-pay | Admitting: *Deleted

## 2022-03-17 ENCOUNTER — Encounter: Payer: Self-pay | Admitting: Student

## 2022-03-17 DIAGNOSIS — E139 Other specified diabetes mellitus without complications: Secondary | ICD-10-CM

## 2022-03-17 MED ORDER — METFORMIN HCL 500 MG PO TABS: ORAL_TABLET | ORAL | 5 refills | Status: DC

## 2022-04-01 DIAGNOSIS — H9201 Otalgia, right ear: Secondary | ICD-10-CM | POA: Diagnosis not present

## 2022-04-01 DIAGNOSIS — J0110 Acute frontal sinusitis, unspecified: Secondary | ICD-10-CM | POA: Diagnosis not present

## 2022-04-01 DIAGNOSIS — E119 Type 2 diabetes mellitus without complications: Secondary | ICD-10-CM | POA: Diagnosis not present

## 2022-04-01 DIAGNOSIS — H6591 Unspecified nonsuppurative otitis media, right ear: Secondary | ICD-10-CM | POA: Diagnosis not present

## 2022-04-01 DIAGNOSIS — J029 Acute pharyngitis, unspecified: Secondary | ICD-10-CM | POA: Diagnosis not present

## 2022-05-12 ENCOUNTER — Encounter: Payer: Self-pay | Admitting: Student

## 2022-11-28 ENCOUNTER — Other Ambulatory Visit: Payer: Self-pay | Admitting: Student

## 2022-11-28 DIAGNOSIS — E139 Other specified diabetes mellitus without complications: Secondary | ICD-10-CM

## 2022-11-28 NOTE — Telephone Encounter (Signed)
LOV - 08/18/2021. I called pt to schedule an appt; pt's agreeable- call transferred to the front office. Appt schedule w/Dr Carron Brazen on 10/16.

## 2022-12-21 ENCOUNTER — Encounter: Payer: Medicaid Other | Admitting: Student

## 2023-03-21 DIAGNOSIS — B029 Zoster without complications: Secondary | ICD-10-CM | POA: Diagnosis not present

## 2023-03-21 DIAGNOSIS — R300 Dysuria: Secondary | ICD-10-CM | POA: Diagnosis not present

## 2023-06-07 DIAGNOSIS — Z760 Encounter for issue of repeat prescription: Secondary | ICD-10-CM | POA: Diagnosis not present

## 2023-06-07 DIAGNOSIS — R030 Elevated blood-pressure reading, without diagnosis of hypertension: Secondary | ICD-10-CM | POA: Diagnosis not present

## 2023-06-07 DIAGNOSIS — E119 Type 2 diabetes mellitus without complications: Secondary | ICD-10-CM | POA: Diagnosis not present
